# Patient Record
Sex: Male | Born: 1940 | Race: White | Hispanic: No | Marital: Married | Smoking: Former smoker
Health system: Southern US, Community
[De-identification: ages and names within clinical notes are randomized; demographics above are authoritative.]

## PROBLEM LIST (undated history)

## (undated) DIAGNOSIS — E7800 Pure hypercholesterolemia, unspecified: Secondary | ICD-10-CM

## (undated) DIAGNOSIS — Z85820 Personal history of malignant melanoma of skin: Secondary | ICD-10-CM

## (undated) DIAGNOSIS — K219 Gastro-esophageal reflux disease without esophagitis: Secondary | ICD-10-CM

## (undated) DIAGNOSIS — G250 Essential tremor: Secondary | ICD-10-CM

## (undated) DIAGNOSIS — E782 Mixed hyperlipidemia: Secondary | ICD-10-CM

## (undated) DIAGNOSIS — G629 Polyneuropathy, unspecified: Secondary | ICD-10-CM

## (undated) HISTORY — DX: Personal history of malignant melanoma of skin: Z85.820

## (undated) HISTORY — DX: Pure hypercholesterolemia, unspecified: E78.00

## (undated) HISTORY — DX: Mixed hyperlipidemia: E78.2

## (undated) HISTORY — PX: MELANOMA EXCISION: SHX5266

## (undated) HISTORY — DX: Gastro-esophageal reflux disease without esophagitis: K21.9

## (undated) HISTORY — DX: Polyneuropathy, unspecified: G62.9

## (undated) HISTORY — PX: GALLBLADDER SURGERY: SHX652

## (undated) HISTORY — PX: HERNIA REPAIR: SHX51

## (undated) HISTORY — DX: Essential tremor: G25.0

## (undated) HISTORY — PX: CATARACT EXTRACTION, BILATERAL: SHX1313

---

## 2002-09-19 ENCOUNTER — Observation Stay (HOSPITAL_COMMUNITY): Admission: EM | Admit: 2002-09-19 | Discharge: 2002-09-20 | Payer: Self-pay

## 2003-06-12 ENCOUNTER — Ambulatory Visit (HOSPITAL_COMMUNITY): Admission: RE | Admit: 2003-06-12 | Discharge: 2003-06-12 | Payer: Self-pay | Admitting: Family Medicine

## 2004-03-16 ENCOUNTER — Ambulatory Visit (HOSPITAL_COMMUNITY): Admission: RE | Admit: 2004-03-16 | Discharge: 2004-03-16 | Payer: Self-pay | Admitting: Gastroenterology

## 2004-11-30 ENCOUNTER — Encounter: Admission: RE | Admit: 2004-11-30 | Discharge: 2004-11-30 | Payer: Self-pay | Admitting: Family Medicine

## 2005-02-22 ENCOUNTER — Encounter (INDEPENDENT_AMBULATORY_CARE_PROVIDER_SITE_OTHER): Payer: Self-pay | Admitting: *Deleted

## 2005-02-22 ENCOUNTER — Ambulatory Visit (HOSPITAL_COMMUNITY): Admission: RE | Admit: 2005-02-22 | Discharge: 2005-02-22 | Payer: Self-pay | Admitting: General Surgery

## 2008-04-16 ENCOUNTER — Encounter: Admission: RE | Admit: 2008-04-16 | Discharge: 2008-04-16 | Payer: Self-pay | Admitting: Family Medicine

## 2008-05-12 ENCOUNTER — Encounter: Admission: RE | Admit: 2008-05-12 | Discharge: 2008-05-12 | Payer: Self-pay | Admitting: Family Medicine

## 2008-11-08 ENCOUNTER — Encounter: Admission: RE | Admit: 2008-11-08 | Discharge: 2008-11-08 | Payer: Self-pay | Admitting: Family Medicine

## 2010-08-22 IMAGING — US US ABDOMEN COMPLETE
1 series · 14 of 25 positions shown · non-contrast
Comparison: 11/30/2004

CLINICAL DATA: Right upper quadrant pain.  Worse with meals.

ABDOMEN ULTRASOUND
TECHNIQUE: Complete abdominal ultrasound examination was performed
including evaluation of the liver, gallbladder, bile ducts,
pancreas, kidneys, spleen, IVC, and abdominal aorta.

[Series 1: us abdomen complete · 0.35mm/px · 14 of 70 slices shown]
[im 1/70]
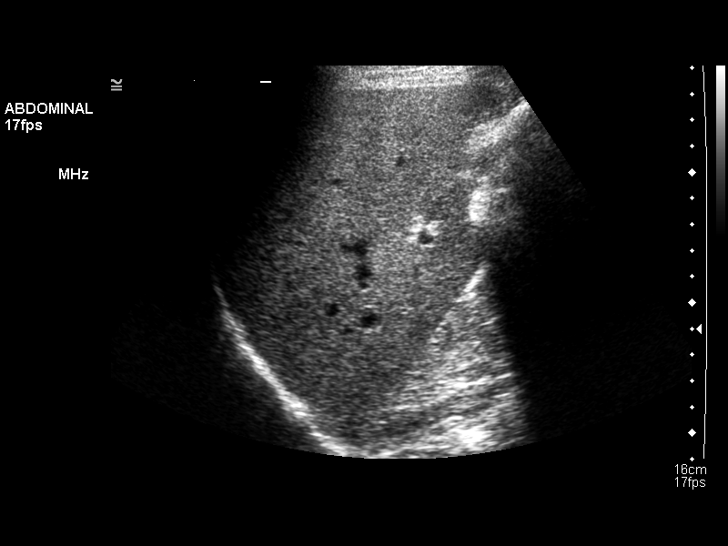
[im 6/70]
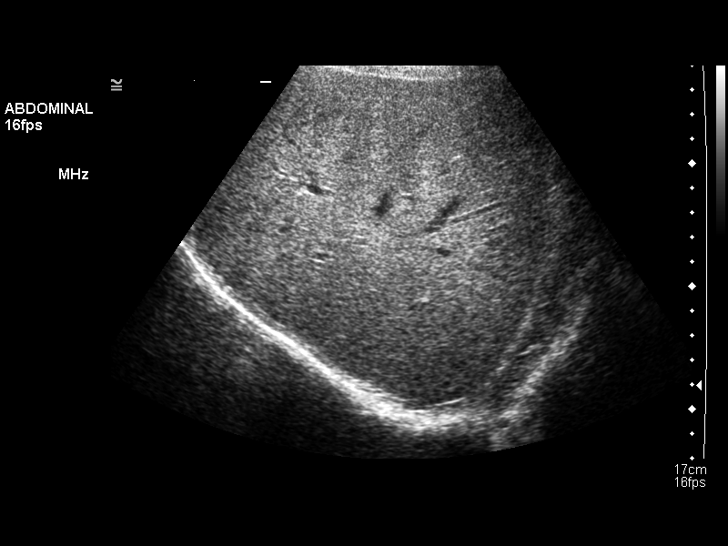
[im 12/70]
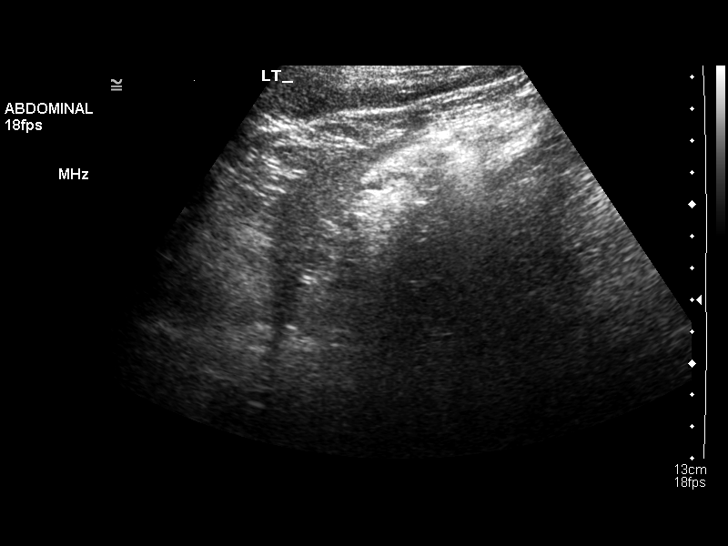
[im 18/70]
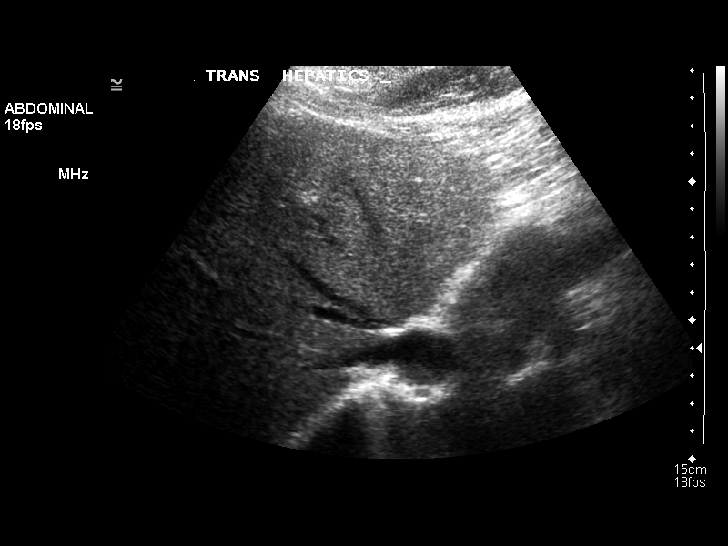
[im 24/70]
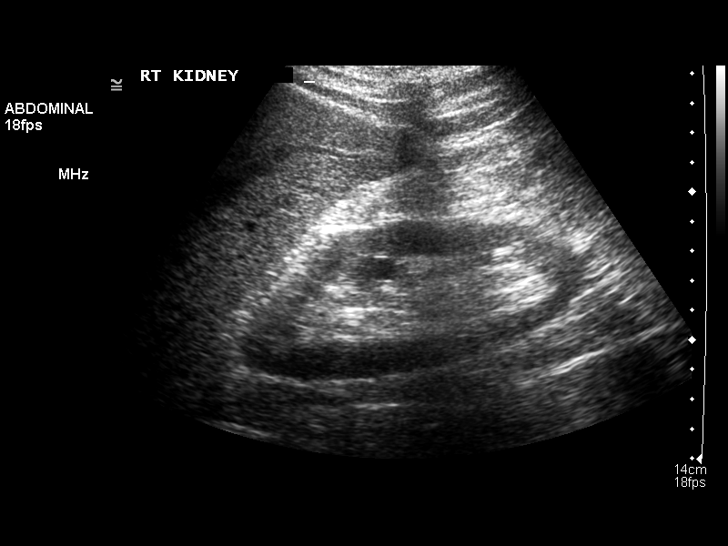
[im 26/70]
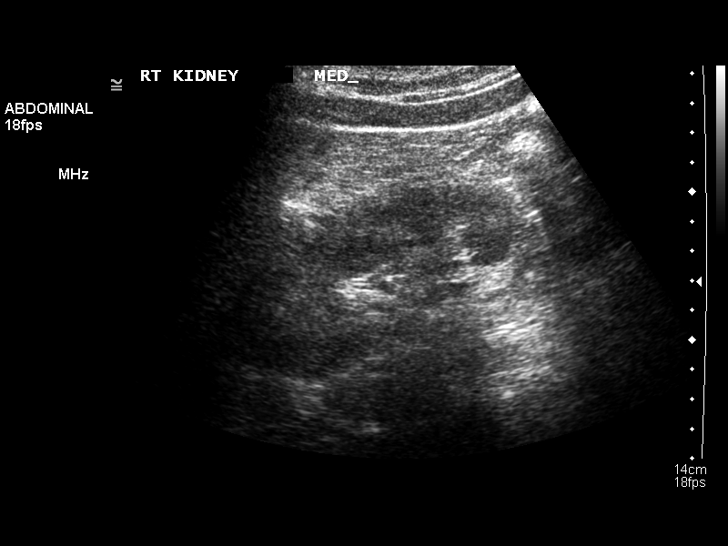
[im 32/70]
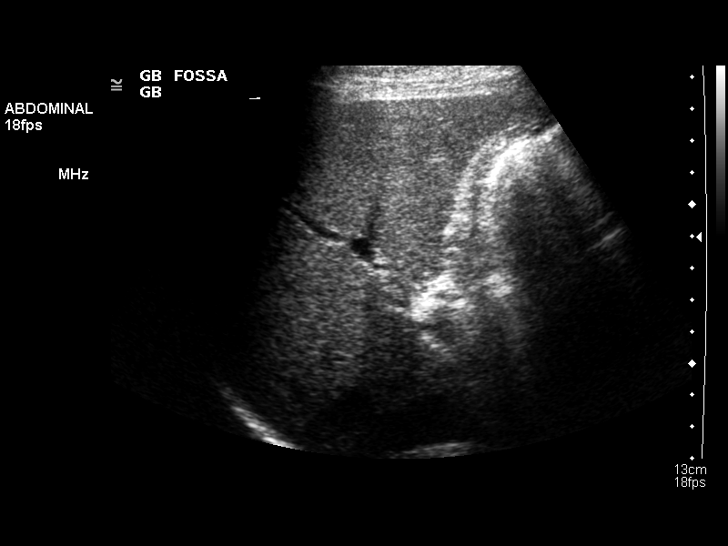
[im 38/70]
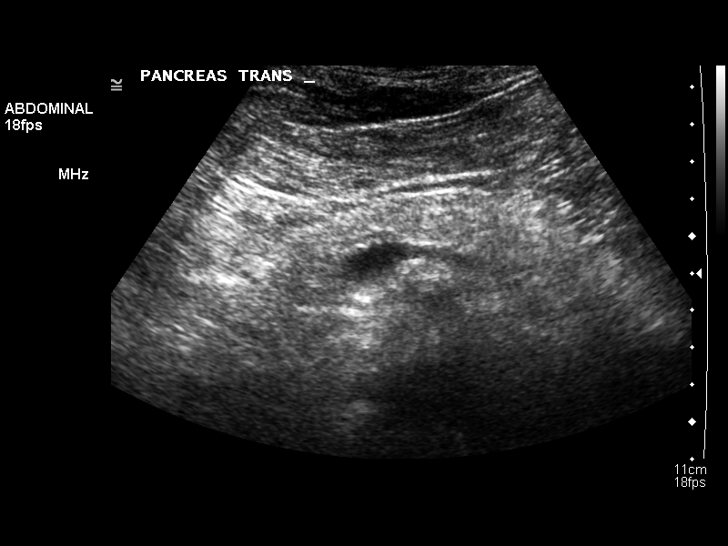
[im 44/70]
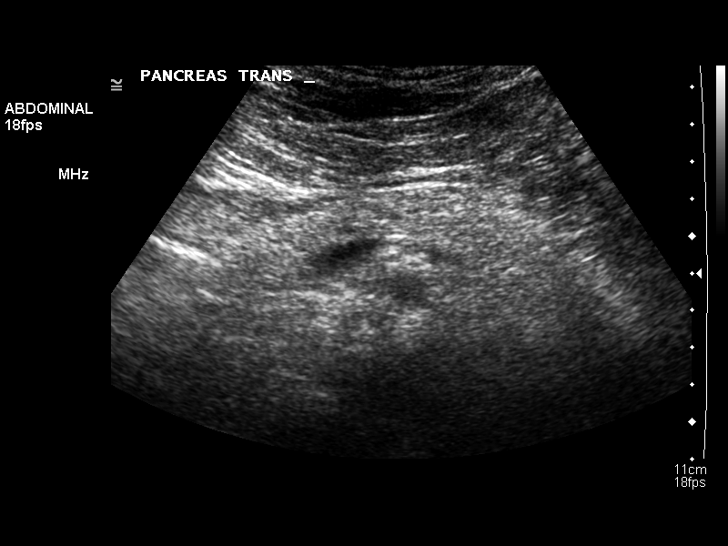
[im 47/70]
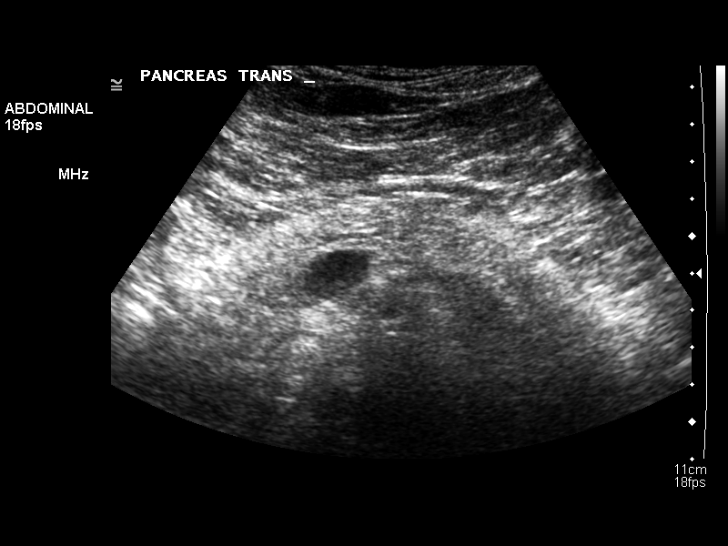
[im 52/70]
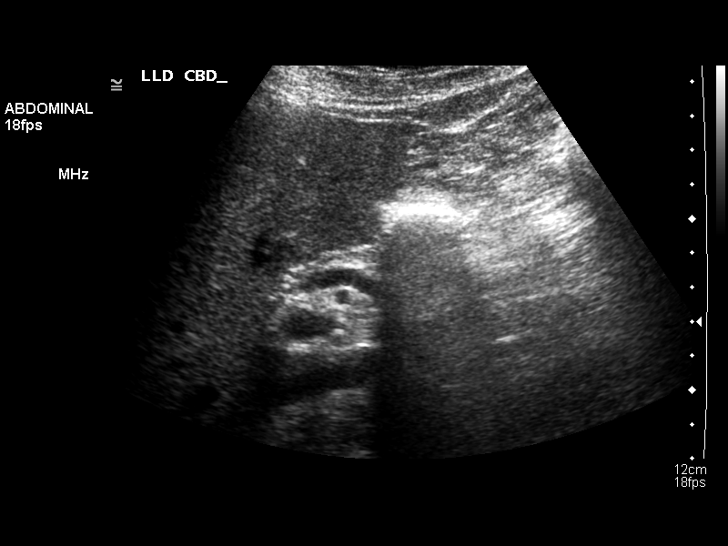
[im 58/70]
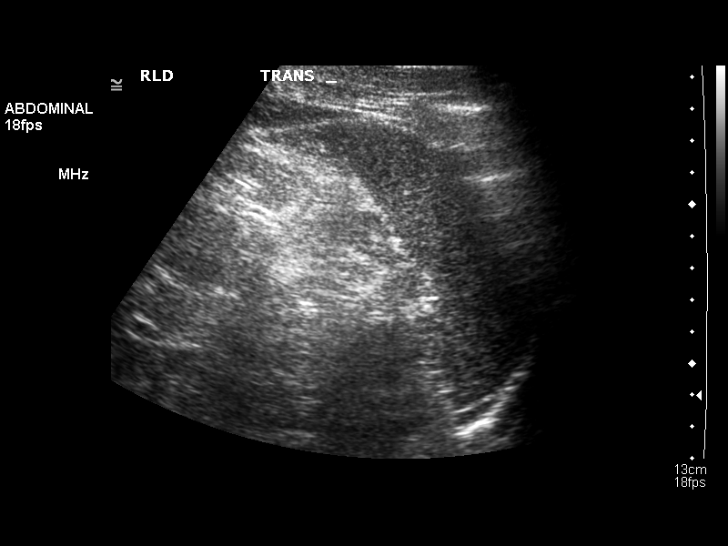
[im 64/70]
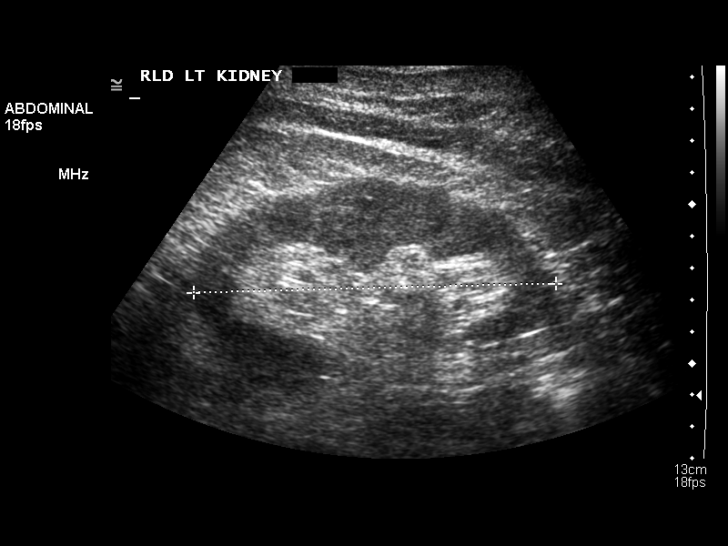
[im 70/70]
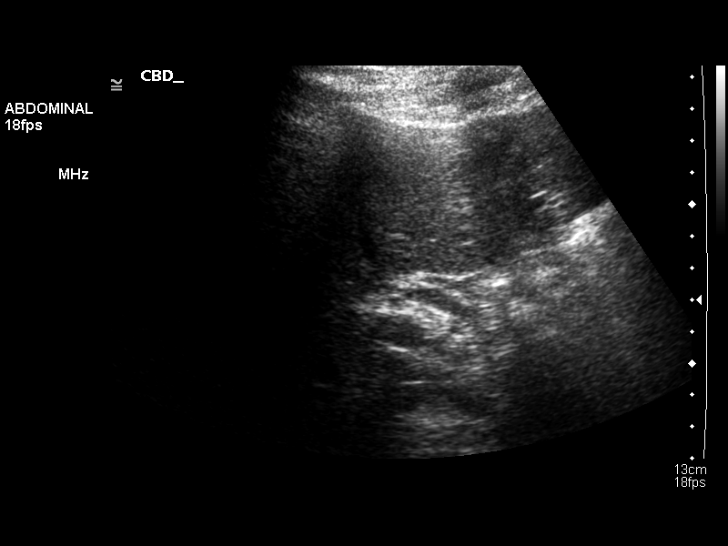

[14 of 25 positions shown; findings below may reference images not displayed]

FINDINGS: Patient status post cholecystectomy.

Common duct normal at 6mm.

Liver, IVC, pancreas all within normal limits.

Spleen normal in size and echotexture.

Right kidney 11.9 cm and left kidney 12.0 cm.  No hydronephrosis.

Abdominal aorta nonaneurysmal without ascites.
IMPRESSION: 1.  Cholecystectomy without biliary ductal dilatation.
2.  Otherwise, normal abdominal ultrasound as described.

## 2010-08-25 NOTE — Discharge Summary (Signed)
NAME:  Stephen Anderson, Stephen Anderson                        ACCOUNT NO.:  [id number]   MEDICAL RECORD NO.:  [id number]                   PATIENT TYPE:  INP   LOCATION:  3742                                 FACILITY:  [facility]   PHYSICIAN:  Lazaro Arms, M.D.        DATE OF BIRTH:  03/12/1941   DATE OF ADMISSION:  09/19/2002  DATE OF DISCHARGE:  09/20/2002                                 DISCHARGE SUMMARY   PRIMARY CARE PHYSICIAN:  Chales Salmon. Abigail Miyamoto, M.D.   DISCHARGE DIAGNOSES:  1. Atypical chest pain, ruled out for myocardial infarction.  2. History of tremors.   CONSULTATIONS:  Francisca December, M.D. from [facility].   HISTORY OF PRESENT ILLNESS:  Mr. Drumwright was in his usual state of health  until about two weeks ago when he began to have at rest constant chest pain  which had no clear exacerbating or relieving factors.  He has been very  active at work, and did not report that it was worse with more strenuous  activity.  He denied any fever or chills.  He did have some shortness of  breath with exertion which is his baseline.  On the morning of admission, he  said it was worse and that is why he came to the [facility] and  they sent him to the emergency room for evaluation.   HOSPITAL COURSE:  In the emergency room, he was afebrile, pulse was 54,  blood pressure was 130/60.  His EKG noted a partial right bundle branch  block.  He was admitted and ruled out for myocardial infarction with serial  negative enzymes.  He never became pain-free during his hospitalization, in  fact, he had mild chest 2/10 pressure upon arrival to the floor.  At that  point, he was placed on Nitro paste and Lovenox, and continued on telemetry.  In the morning, I asked [facility] to see him given his persistent  nature of his pain as well as his 40 pack year history of smoking.  He was  seen, and given the atypical nature of his pain, now completely normal EKG  which when repeated did  not show the partial right bundle branch block, as  well as his negative enzymes, it was felt that he was safe to go home and  complete instructions as an outpatient.   DISCHARGE MEDICATIONS:  1. Enteric coated aspirin 325 mg p.o. daily.  2. Multivitamin daily.  3. Protonix 40 mg p.o. daily.    FOLLOWUP:  1. He is to have a stress test at [facility].  The office will call     him for an appointment on Monday.  2. He will follow up with Dr. Abigail Miyamoto regarding his possible     gastroesophageal reflux disease.  He did have an esophageal stricture in     the remote past.  Lazaro Arms, M.D.    AMC/MEDQ  D:  09/20/2002  T:  09/20/2002  Job:  [id number]   cc:   Chales Salmon. Abigail Miyamoto, M.D.  [address]  [place]  [place] [address]  Fax: [phone]   Francisca December, M.D.  [address]  [address]  [place]  [place] [address]  Fax: [phone]

## 2010-08-25 NOTE — Consult Note (Signed)
NAME:  Stephen Anderson, Stephen Anderson                        ACCOUNT NO.:  [id number]   MEDICAL RECORD NO.:  [id number]                   PATIENT TYPE:  INP   LOCATION:  3742                                 FACILITY:  [facility]   PHYSICIAN:  Francisca December, M.D.               DATE OF BIRTH:  [birth year]   DATE OF CONSULTATION:  09/20/2002  DATE OF DISCHARGE:                                   CONSULTATION   REASON FOR CONSULTATION:  Chest tightness.   HISTORY OF PRESENT ILLNESS:  The patient is a pleasant 70 year old male  without a prior cardiac history, who approximately  1 week ago became aware  of a focal chest discomfort in the left lower sternal border without  radiation. He did not have any shortness of breath or nausea or associated  diaphoresis. He was relatively constant and mild. He once had a pressure  sensation. He was able to continue working without difficulty or  exacerbation of symptoms. 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 Just prior to the onset of symptoms he had an increase in strenuous  heavy manual labor at work.  He has not had any discomfort like this before.  He denies any discomfort now, although did have his last episode last  evening when being transferred from the emergency room off oxygen and he got  up to go to the bathroom. He has been up now off oxygen for several hours  without any discomfort.   ALLERGIES:  No known drug allergies.   MEDICATIONS:  1. Aspirin 81 mg p.o. every day.  2. Primidone questionable dose p.o. every day.  3. Multivitamin.   PAST MEDICAL HISTORY:  1. Essential tremor.  2. Bilateral hernia repair.  3. Hiatal hernia and gastroesophageal reflux disease.  4. Remote history of esophageal  stricture.   SOCIAL HISTORY:  He is married and lives with his wife. He is a self-  Field seismologist. He used tobacco products for 40 years. No ethanol or drug  use. He is the father of 2, grandfather of 6, great grandfather of 1.   FAMILY HISTORY:  Not significant for early coronary artery disease.   REVIEW OF SYSTEMS:  Denies any fevers or chills or congestion. He has not  been light headed or dizzy. He has had some choking on food recently. He  denies any melena or hematochezia. No chronic  abdominal pain. No dysuria,  hematuria or nocturia. He does note the onset of claudication or numbness  with arms over his chest level. He denies any lower extremity claudication.  No excessive muscle weakness or joint pain. He never had a neurologic  disorder such as a stroke or seizures.   PHYSICAL EXAMINATION:  VITAL SIGNS:  Blood pressure 104/64, pulse 62,  temperature 97.3, respirations 18, oxygen saturation on 2 liters nasal  cannula 98%. Telemetry showed sinus rhythm and sinus bradycardia throughout.  GENERAL:  He is a well appearing 70 year old gentleman, alert, pleasant and  cooperative.  HEENT:  Unremarkable. The head is normocephalic and atraumatic. Pupils are  equal, round and reactive to light and accomodation. Extraocular movements  intact. Sclerae anicteric. Oral mucosa pink and moist. Tongue is not coated.  NECK:  Supple without jugular venous distention sitting upright. There is no  bruits. The carotid upstrokes are normal. No thyromegaly.  CHEST:  Clear with adequate excursion. No bruit in the supraclavicular fossa  bilaterally.  No wheezing, rales or rhonchi on auscultation. The chest wall is nontender  to palpation.  HEART:  Regular rhythm, normal S1 and S2 is heard. No S3, S4, murmur, click  or rub noted.  ABDOMEN:  Soft, nontender without hepatosplenomegaly or midline pulsatile  mass. Bowel sounds are present in all quadrants.  GU:  External genitalia without lesions, testes descended.  RECTAL:  Not performed.  EXTREMITIES:  Full range of motion, no edema.  Intact distal  pulses.  NEUROLOGIC:  Cranial nerves 2 to 12 grossly intact. Motor and sensory  grossly  intact. Gait not tested.  SKIN:  Warm and  dry and clear.   LABORATORY DATA:  Electrocardiogram, sinus rhythm is normal this morning. He  did have a tracing with a flattened T-wave in V3 yesterday. Chest x-ray no  active disease.   Serum electrolytes, BUN, creatinine, glucose, normal. Admission hemogram  normal. CK-MB and troponin negative x3.   IMPRESSION:  1. Atypical angina.  2. History of severe esophageal disease.  3. Ongoing tobacco abuse.  4. Questionable lipid status.  5. Male sex and typical age.   PLAN:  Will recommend discharge to home on proton pump inhibitor, outpatient  stress test within the next 1 week. If he has recurrence of discomfort he  should call my office number. Recommend discontinue smoking. Also would  recommend GI evaluation if stress  test is unremarkable.                                               Francisca December, M.D.    JHE/MEDQ  D:  09/20/2002  T:  09/20/2002  Job:  [id number]   cc:   Chales Salmon. Abigail Miyamoto, M.D.  [address]  [place]  [place] [address]  Fax: [phone]

## 2010-08-25 NOTE — Op Note (Signed)
NAME:  Stephen Anderson, Stephen Anderson NO.:  [id number]   MEDICAL RECORD NO.:  [id number]          PATIENT TYPE:  AMB   LOCATION:  ENDO                         FACILITY:  [facility]   PHYSICIAN:  Bernette Redbird, M.D.   DATE OF BIRTH:  [birth year]   DATE OF PROCEDURE:  DATE OF DISCHARGE:                                 OPERATIVE REPORT   Audio too short to transcribe (less than 5 seconds)       RB/MEDQ  D:  03/16/2004  T:  03/16/2004  Job:  [id number]

## 2010-08-25 NOTE — Op Note (Signed)
NAME:  Stephen Anderson, Stephen Anderson NO.:  [id number]   MEDICAL RECORD NO.:  [id number]          PATIENT TYPE:  AMB   LOCATION:  ENDO                         FACILITY:  [facility]   PHYSICIAN:  Bernette Redbird, M.D.   DATE OF BIRTH:  02/26/1941   DATE OF PROCEDURE:  03/16/2004  DATE OF DISCHARGE:                                 OPERATIVE REPORT   PROCEDURE PERFORMED:  Colonoscopy.   ENDOSCOPIST:  Florencia Reasons, M.D.   INDICATIONS FOR PROCEDURE:  Screening.   FINDINGS:  Normal exam to the terminal ileum except mild left side  diverticulosis.   DESCRIPTION OF PROCEDURE:  The nature, purpose and risks of the procedure  had been discussed with the patient, who provided written consent.  Sedation  for this procedure and the upper endoscopy which preceded it totaled  fentanyl 75 mcg and Versed 5 mg IV without arrhythmias or desaturation.   Digital exam of the prostate was normal.  The Olympus adult video  colonoscope was advanced quite easily around the colon to the terminal ileum  which had a normal appearance and pullback was then performed.  The quality  of the prep was excellent and it is felt that all areas were well seen.  There were a few left-sided diverticula but this was otherwise a normal  examination, without evidence of polyps, cancer, colitis or vascular  malformations.  Retroflexion in the rectum and reinspection of the rectum  were unremarkable.  No biopsies were obtained.   The patient tolerated the procedure well and there were no apparent  complications.   IMPRESSION:  1.  Screening exam in a standard risk individual, without worrisome findings      (R5188).  2.  Mild to moderate left-sided diverticulosis.   PLAN:  Flexible sigmoidoscopy in five years for continued screening.      RB/MEDQ  D:  03/16/2004  T:  03/17/2004  Job:  [id number]   cc:   Sigmund Hazel, M.D.  [address]  [address] [place], [place] [address]  Fax: [phone]

## 2010-08-25 NOTE — Op Note (Signed)
NAME:  DENSON, NICCOLI NO.:  [id number]   MEDICAL RECORD NO.:  [id number]          PATIENT TYPE:  AMB   LOCATION:  ENDO                         FACILITY:  [facility]   PHYSICIAN:  Bernette Redbird, M.D.   DATE OF BIRTH:  [birth year]   DATE OF PROCEDURE:  03/16/2004  DATE OF DISCHARGE:                                 OPERATIVE REPORT   PROCEDURE PERFORMED:  Upper endoscopy.   INDICATIONS:  Reflux symptoms in a 70 year old.   FINDINGS:  Normal, perhaps slight inflammatory change in the vocal cords.   SURGEON:  Bernette Redbird, M.D.   The procedure, the nature, purpose and risks of the procedure had been  discussed with the patient who provided written consent.   SEDATION:  Fentanyl 75 mcg and Versed 5 mg IV without arrhythmias or  desaturations.   PROCEDURE IN DETAIL:  The Olympus adult, video endoscope was passed under  direct vision.  The vocal cords were pertinent for what appears to be some  slight inflammatory change in the posterior aspect of the larynx,  characterized by edema, erythema and some slight irregularity on the  posterior aspect of the right cord. The esophagus was entered and was  endoscopically normal without evidence of reflux esophagitis, free reflux,  Barrett's esophagus, varices, infection, neoplasia or any ring stricture or  hiatal hernia.  The stomach contained no significant residual and had  essentially normal mucosa, just a little bit of mottled erythema, no  erosions, ulcers, polyps or masses, including a retroflexed view of the  cardia.  The pylorus, duodenal folds and second duodenum was normal.   The scope was removed from the patient.  No biopsies were obtained.  He  tolerated the procedure well and there were no apparent complications.   IMPRESSION:  Possible mild reflux pharyngitis.   PLAN:  1.  Continue PPI therapy.  2.  Consider ENT evaluation.       RB/MEDQ  D:  03/16/2004  T:  03/17/2004  Job:  [id number]   cc:   Sigmund Hazel, M.D.  [address]  [address] [place], [place] [address]  Fax: [phone]

## 2010-08-25 NOTE — Op Note (Signed)
NAME:  Stephen Anderson, Stephen Anderson NO.:  [id number]   MEDICAL RECORD NO.:  [id number]          PATIENT TYPE:  AMB   LOCATION:  DAY                          FACILITY:  [facility]   PHYSICIAN:  Ollen Gross. Vernell Morgans, M.D. DATE OF BIRTH:  06/15/1940   DATE OF PROCEDURE:  02/22/2005  DATE OF DISCHARGE:  02/22/2005                                 OPERATIVE REPORT   PREOPERATIVE DIAGNOSIS:  Gallstones.   POSTOPERATIVE DIAGNOSIS:  Gallstones.   PROCEDURE:  Laparoscopic cholecystectomy with intraoperative cholangiogram.   SURGEON:  Dr. Carolynne Edouard   ASSISTANT:  Dr. Abbey Chatters   ANESTHESIA:  General endotracheal.   PROCEDURE:  After informed consent was obtained, the patient was brought to  the operating room, placed in supine position on the operating room table.  After adequate induction of general anesthesia, the patient's abdomen was  prepped with Betadine and draped in usual sterile manner.  The area below  the umbilicus was infiltrated with 260.5% Marcaine.  A small incision was  made with the 15 blade knife.  This incision was carried down through the  subcutaneous tissue bluntly with a hemostat and [employer] retractors until  the linea alba was identified.  The linea alba was incised with the 15 blade  knife, and each side was grasped with Kocher clamps and elevated anteriorly.  The preperitoneal space was then probed bluntly with a hemostat until the  peritoneum was opened and access was gained to the abdominal cavity.  A 0  Vicryl pursestring stitch was placed in the fascia surrounding the opening.  A Hasson cannula was placed through the opening and anchored in place with  the previously placed Vicryl pursestring stitch.  The abdomen was then  insufflated with carbon dioxide without difficulty.  A laparoscope was  inserted through the Hasson cannula, and the right upper quadrant was  inspected.  The dome of the gallbladder and liver were readily identified.  Next, the patient was  placed in the head-up position, rotated slightly with  the right side up.  The epigastric region was then infiltrated with 0.25%  Marcaine.  A small incision was made with the 15 blade knife.  A 10 mm port  was placed bluntly through this incision into the abdominal cavity under  direct vision.  Sites were then chosen laterally on the right side for  placement of 5 mm ports.  Each of these areas was infiltrated with 0.25%  Marcaine.  Small stab incisions were made with a 15 blade knife.  Five mm  ports were placed bluntly through these incisions into the abdominal cavity  under direct vision.  A blunt grasper was placed through the lateral most 5  mm port and used to grasp the dome of the gallbladder and elevate it  anteriorly and superiorly.  A blunt grasper was placed through the other 5  mm port and used to retract on the body and neck of the gallbladder.  A  dissector was placed through the epigastric port and using the  electrocautery, the peritoneal reflection at the gallbladder neck was  opened.  Blunt dissection was then  carried out in this area until the  gallbladder neck cystic duct junction was readily identified and a good  window was created.  A single clip was placed on the gallbladder neck.  A  small ductotomy was made just below the clip with the laparoscopic scissors.  A 14 gauge Angiocath was placed percutaneously through the anterior  abdominal wall under direct vision.  A Reddick cholangiogram catheter was  placed through the Angiocath and flushed.  The Reddick catheter was then  placed within the cystic duct and anchored in place with the clip.  Cholangiogram was obtained that showed no filling defects, good emptying in  the duodenum, and adequate length on the cystic duct.  The anchoring clip  and catheters were then removed from the patient.  Three clips were placed  proximally on the cystic duct, and duct was divided between the 2 sets of  clips.  Posterior to this,  the cystic artery was identified and again  dissected bluntly in a circumferential manner until a good window was  created.  Two clips were placed proximally and one distally on the artery,  and the artery was divided between the two.  Next, a laparoscopic hook  cautery device was used to separate the gallbladder from the liver bed.  Prior to completely detaching the gallbladder from the liver bed, the liver  bed was inspected, and several small bleeding points were coagulated with  electrocautery until the area was completely hemostatic.  The gallbladder  was then detached the rest of the way from the liver bed without difficulty.  Next, a laparoscopic bag was inserted through the epigastric port.  The  gallbladder was placed within the bag, and the bag was sealed.  The abdomen  was then irrigated with copious amounts of saline until the effluent was  clear.  The laparoscope was then moved to the epigastric port and  gallbladder grasper was placed through the Hasson cannula and used to grasp  the opening of the bag.  The bag with the gallbladder was then removed  through the infraumbilical port with the Hasson cannula without difficulty.  The fascial defect was closed with the previously placed Vicryl pursestring  stitch as well as with another interrupted 0 Vicryl stitch.  The rest of the  ports were removed under direct vision and were found to be hemostatic.  The  gas was allowed to escape.  The skin incisions were all closed with  interrupted 4-0 Monocryl subcuticular stitches.  Benzoin, Steri-Strips, and  sterile dressings were applied.  The patient tolerated the procedure well.  At the end of the case, all needle, sponge, and instrument counts were  correct.  The patient was then awakened and taken to recovery in stable  condition.      Ollen Gross. Vernell Morgans, M.D.  Electronically Signed    PST/MEDQ  D:  02/27/2005  T:  02/27/2005  Job:  [id number]

## 2011-07-20 DIAGNOSIS — E782 Mixed hyperlipidemia: Secondary | ICD-10-CM | POA: Diagnosis not present

## 2011-07-20 DIAGNOSIS — M25549 Pain in joints of unspecified hand: Secondary | ICD-10-CM | POA: Diagnosis not present

## 2011-07-20 DIAGNOSIS — K219 Gastro-esophageal reflux disease without esophagitis: Secondary | ICD-10-CM | POA: Diagnosis not present

## 2011-07-20 DIAGNOSIS — G250 Essential tremor: Secondary | ICD-10-CM | POA: Diagnosis not present

## 2011-07-20 DIAGNOSIS — Z86010 Personal history of colonic polyps: Secondary | ICD-10-CM | POA: Diagnosis not present

## 2011-07-20 DIAGNOSIS — G609 Hereditary and idiopathic neuropathy, unspecified: Secondary | ICD-10-CM | POA: Diagnosis not present

## 2011-07-20 DIAGNOSIS — Z79899 Other long term (current) drug therapy: Secondary | ICD-10-CM | POA: Diagnosis not present

## 2011-07-20 DIAGNOSIS — G252 Other specified forms of tremor: Secondary | ICD-10-CM | POA: Diagnosis not present

## 2011-07-25 DIAGNOSIS — S335XXA Sprain of ligaments of lumbar spine, initial encounter: Secondary | ICD-10-CM | POA: Diagnosis not present

## 2011-10-18 DIAGNOSIS — K5730 Diverticulosis of large intestine without perforation or abscess without bleeding: Secondary | ICD-10-CM | POA: Diagnosis not present

## 2011-10-18 DIAGNOSIS — Z09 Encounter for follow-up examination after completed treatment for conditions other than malignant neoplasm: Secondary | ICD-10-CM | POA: Diagnosis not present

## 2011-10-18 DIAGNOSIS — Z86010 Personal history of colonic polyps: Secondary | ICD-10-CM | POA: Diagnosis not present

## 2011-12-04 DIAGNOSIS — H52209 Unspecified astigmatism, unspecified eye: Secondary | ICD-10-CM | POA: Diagnosis not present

## 2011-12-04 DIAGNOSIS — Z961 Presence of intraocular lens: Secondary | ICD-10-CM | POA: Diagnosis not present

## 2011-12-19 DIAGNOSIS — D1801 Hemangioma of skin and subcutaneous tissue: Secondary | ICD-10-CM | POA: Diagnosis not present

## 2012-03-25 DIAGNOSIS — E559 Vitamin D deficiency, unspecified: Secondary | ICD-10-CM | POA: Diagnosis not present

## 2012-03-25 DIAGNOSIS — G250 Essential tremor: Secondary | ICD-10-CM | POA: Diagnosis not present

## 2012-03-25 DIAGNOSIS — G252 Other specified forms of tremor: Secondary | ICD-10-CM | POA: Diagnosis not present

## 2012-03-25 DIAGNOSIS — E538 Deficiency of other specified B group vitamins: Secondary | ICD-10-CM | POA: Diagnosis not present

## 2012-03-25 DIAGNOSIS — K219 Gastro-esophageal reflux disease without esophagitis: Secondary | ICD-10-CM | POA: Diagnosis not present

## 2012-03-25 DIAGNOSIS — E782 Mixed hyperlipidemia: Secondary | ICD-10-CM | POA: Diagnosis not present

## 2012-03-25 DIAGNOSIS — G609 Hereditary and idiopathic neuropathy, unspecified: Secondary | ICD-10-CM | POA: Diagnosis not present

## 2012-03-25 DIAGNOSIS — Z79899 Other long term (current) drug therapy: Secondary | ICD-10-CM | POA: Diagnosis not present

## 2012-03-25 DIAGNOSIS — Z125 Encounter for screening for malignant neoplasm of prostate: Secondary | ICD-10-CM | POA: Diagnosis not present

## 2012-05-16 ENCOUNTER — Encounter: Payer: Self-pay | Admitting: Neurology

## 2012-05-16 ENCOUNTER — Ambulatory Visit (INDEPENDENT_AMBULATORY_CARE_PROVIDER_SITE_OTHER): Payer: Medicare Other | Admitting: Neurology

## 2012-05-16 ENCOUNTER — Other Ambulatory Visit: Payer: Self-pay | Admitting: Neurology

## 2012-05-16 VITALS — BP 120/74 | HR 68 | Temp 97.6°F | Resp 16 | Ht 69.0 in | Wt 180.0 lb

## 2012-05-16 DIAGNOSIS — G252 Other specified forms of tremor: Secondary | ICD-10-CM | POA: Diagnosis not present

## 2012-05-16 DIAGNOSIS — G609 Hereditary and idiopathic neuropathy, unspecified: Secondary | ICD-10-CM | POA: Insufficient documentation

## 2012-05-16 DIAGNOSIS — G250 Essential tremor: Secondary | ICD-10-CM | POA: Insufficient documentation

## 2012-05-16 DIAGNOSIS — G629 Polyneuropathy, unspecified: Secondary | ICD-10-CM | POA: Insufficient documentation

## 2012-05-16 LAB — FOLATE: Folate: 13.8 ng/mL (ref >5.9)

## 2012-05-16 LAB — BASIC METABOLIC PANEL
BUN: 20 mg/dL (ref 6–23)
CO2: 25 mEq/L (ref 19–32)
Calcium: 9.2 mg/dL (ref 8.4–10.5)
Chloride: 105 mEq/L (ref 96–112)
Creatinine, Ser: 1.2 mg/dL (ref 0.4–1.5)
GFR: 64.88 mL/min (ref >60.00)
Glucose, Bld: 109 mg/dL — ABNORMAL HIGH (ref 70–99)
Potassium: 4.6 mEq/L (ref 3.5–5.1)
Sodium: 139 mEq/L (ref 135–145)

## 2012-05-16 LAB — RPR

## 2012-05-16 LAB — VITAMIN B12: Vitamin B-12: 506 pg/mL (ref 211–911)

## 2012-05-16 MED ORDER — CLONAZEPAM 0.5 MG PO TABS: ORAL_TABLET | ORAL | Status: DC

## 2012-05-16 NOTE — Progress Notes (Signed)
Subjective:   Stephen Anderson was seen in consultation in the movement disorder clinic.  The evaluation is for tremor.  His PCP is [name],[name], MD.   The patient is a 72 y.o. right handed male with a history of tremor.Onset of symptoms was 30+ years ago.   Tremor primarily involves the bilateral hand and chin.   Since he quit smoking 4 years ago, his hand tremor went away.   Tremor exacerbated by pressure on the chin.   He is not biting himself because of the tremor.   It only bothers him a little but it bothers his wife.  There is a family hx of tremor in his mother.  He is on primidone and has been on it for 30 years.  He is on 50 mg, 2 tablets twice per day.  He thinks that it helps.  He reports arm/hand and leg paresthesias for years.  He has definite paresthesias from the knee down.  He is on neurontin for this.  He has some loss of balance.  He does not fall on the level ground.  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    Current/Previously tried tremor medications: primidone  Current medications that may exacerbate tremor:  n/a   Allergies  Allergen Reactions  . Oxycontin (Oxycodone Hcl Er) Hives    Current Outpatient Prescriptions on File Prior to Visit  Medication Sig Dispense Refill  . CRESTOR 5 MG tablet       . gabapentin (NEURONTIN) 300 MG capsule       . omeprazole (PRILOSEC) 40 MG capsule       . primidone (MYSOLINE) 50 MG tablet         History reviewed. No pertinent past medical history.  History reviewed. No pertinent past surgical history.  History   Social History  . Marital Status: Married    Spouse Name: N/A    Number of Children: N/A  . Years of Education: N/A   Occupational History  . Not on file.   Social History Main Topics  . Smoking status: Not on file  . Smokeless tobacco: Not on file  . Alcohol Use: Not on file  . Drug Use: Not on file  . Sexually Active: Not on file   Other Topics Concern  . Not on file   Social  History Narrative  . No narrative on file    No family status information on file.    Review of Systems A complete 10 system ROS was obtained and was negative apart from what is mentioned.   Objective:   VITALS:   Filed Vitals:   05/16/12 0853  BP: 120/74  Pulse: 68  Temp: 97.6 F (36.4 C)  Resp: 16  Height: 5\' 9"  (1.753 m)  Weight: 180 lb (81.647 kg)   Gen:  Appears stated age and in NAD. HEENT:  Normocephalic, atraumatic. The mucous membranes are moist. The superficial temporal arteries are without ropiness or tenderness. Cardiovascular: Regular rate and rhythm. Lungs: Clear to auscultation bilaterally. Neck: There are no carotid bruits noted bilaterally.  NEUROLOGICAL:  Orientation:  The patient is alert and oriented x 3.  Recent and remote memory are intact.  Attention span and concentration are normal.  Able to name objects and repeat without trouble.  Fund of knowledge is appropriate Cranial nerves: There is good facial symmetry. The pupils are equal round and reactive to light bilaterally. Fundoscopic exam reveals clear disc margins bilaterally. Extraocular muscles are intact  and visual fields are full to confrontational testing. Speech is fluent and clear. Soft palate rises symmetrically and there is no tongue deviation. Hearing is intact to conversational tone. Tone: Tone is good throughout. Sensation: Sensation is intact to light touch and pinprick throughout (facial, trunk, extremities). Vibration is decreased distally. There is no extinction with double simultaneous stimulation. There is no sensory dermatomal level identified. Coordination:  The patient has no dysdiadichokinesia or dysmetria. Motor: Strength is 5/5 in the bilateral upper and lower extremities.  Shoulder shrug is equal bilaterally.  There is no pronator drift.  There are no fasciculations noted. DTR's: Deep tendon reflexes are 2/4 at the bilateral biceps, triceps, brachioradialis, patella and achilles.   Plantar responses are downgoing bilaterally. Gait and Station: The patient is able to ambulate without difficulty.   MOVEMENT EXAM: Tremor:  There is minimal tremor in the UE. A moderate chin tremor is present.    The patient has mild difficulty with Archimedes spirals bilaterally.  He has no difficulty pouring water from one glass to another without spilling it.     Assessment/Plan   1.  Essential Tremor  -This is somewhat atypical in that the chin tremor is most prominent.  However, given the fact that he has a family history of tremor and it has been present in him for over 30 years, this likely represents a form of essential tremor, and not just benign chin tremor.  -I gave him several options to include doing nothing and staying on the primidone 100 mg twice a day, increasing the primidone or trying a small dosage of clonazepam at bedtime.  He would like to try the clonazepam.  If that does not work, then he will call him back and we will try to increase the dose of the primidone.  I asked him to just take the clonazepam at bedtime initially because of the side effect of sleepiness.  Risks, benefits, side effects and alternative therapies were discussed.  The opportunity to ask questions was given and they were answered to the best of my ability.  The patient expressed understanding and willingness to follow the outlined treatment protocols. 2.  Peripheral neuropathy, idiopathic.  -I am going to check some lab work including B12, folate, RPR and serum and urinary protein electrophoresis with immunofixation.  We are going to hold off on an EMG for now.  Talked about safety associated with peripheral neuropathy. 3.  Return in about 6 months (around 11/13/2012).

## 2012-05-16 NOTE — Patient Instructions (Addendum)
1.  Continue with the primidone 2.  Start klonopin - 0.5 mg - 1/2 tablet at night.  If no help and no side effects, increase to one tablet at night and call me in a few weeks to let me know how you are doing 3.  We will get labs today

## 2012-05-20 LAB — SPEP & IFE WITH QIG
Albumin ELP: 59.3 % (ref 55.8–66.1)
Alpha-1-Globulin: 4.2 % (ref 2.9–4.9)
Alpha-2-Globulin: 11.7 % (ref 7.1–11.8)
Beta 2: 5.5 % (ref 3.2–6.5)
Beta Globulin: 6.4 % (ref 4.7–7.2)
Gamma Globulin: 12.9 % (ref 11.1–18.8)
IgA: 169 mg/dL (ref 68–379)
IgG (Immunoglobin G), Serum: 1090 mg/dL (ref 650–1600)
IgM, Serum: 48 mg/dL (ref 41–251)
Total Protein, Serum Electrophoresis: 7.1 g/dL (ref 6.0–8.3)

## 2012-05-20 LAB — IMMUNOFIXATION ELECTROPHORESIS
IgA: 174 mg/dL (ref 68–379)
IgG (Immunoglobin G), Serum: 1110 mg/dL (ref 650–1600)
IgM, Serum: 48 mg/dL (ref 41–251)
Total Protein, Serum Electrophoresis: 6.9 g/dL (ref 6.0–8.3)

## 2012-05-20 LAB — PROTEIN ELECTROPHORESIS, URINE REFLEX: Total Protein, Urine: 10 mg/dL

## 2012-05-28 ENCOUNTER — Telehealth: Payer: Self-pay

## 2012-05-28 NOTE — Telephone Encounter (Signed)
Great. Thank you.

## 2012-05-28 NOTE — Telephone Encounter (Signed)
Pt calling to let you know that the half a klonopin is working well for him.

## 2012-07-03 DIAGNOSIS — E538 Deficiency of other specified B group vitamins: Secondary | ICD-10-CM | POA: Diagnosis not present

## 2012-07-03 DIAGNOSIS — Z79899 Other long term (current) drug therapy: Secondary | ICD-10-CM | POA: Diagnosis not present

## 2012-07-03 DIAGNOSIS — E782 Mixed hyperlipidemia: Secondary | ICD-10-CM | POA: Diagnosis not present

## 2012-07-03 DIAGNOSIS — G252 Other specified forms of tremor: Secondary | ICD-10-CM | POA: Diagnosis not present

## 2012-07-03 DIAGNOSIS — K219 Gastro-esophageal reflux disease without esophagitis: Secondary | ICD-10-CM | POA: Diagnosis not present

## 2012-07-03 DIAGNOSIS — G250 Essential tremor: Secondary | ICD-10-CM | POA: Diagnosis not present

## 2012-07-03 DIAGNOSIS — E559 Vitamin D deficiency, unspecified: Secondary | ICD-10-CM | POA: Diagnosis not present

## 2012-07-03 DIAGNOSIS — Z125 Encounter for screening for malignant neoplasm of prostate: Secondary | ICD-10-CM | POA: Diagnosis not present

## 2012-07-03 DIAGNOSIS — G609 Hereditary and idiopathic neuropathy, unspecified: Secondary | ICD-10-CM | POA: Diagnosis not present

## 2012-08-07 ENCOUNTER — Other Ambulatory Visit: Payer: Self-pay | Admitting: Physician Assistant

## 2012-08-07 DIAGNOSIS — L820 Inflamed seborrheic keratosis: Secondary | ICD-10-CM | POA: Diagnosis not present

## 2012-08-07 DIAGNOSIS — L570 Actinic keratosis: Secondary | ICD-10-CM | POA: Diagnosis not present

## 2012-08-07 DIAGNOSIS — D485 Neoplasm of uncertain behavior of skin: Secondary | ICD-10-CM | POA: Diagnosis not present

## 2012-10-15 DIAGNOSIS — E782 Mixed hyperlipidemia: Secondary | ICD-10-CM | POA: Diagnosis not present

## 2012-10-15 DIAGNOSIS — G250 Essential tremor: Secondary | ICD-10-CM | POA: Diagnosis not present

## 2012-10-15 DIAGNOSIS — E559 Vitamin D deficiency, unspecified: Secondary | ICD-10-CM | POA: Diagnosis not present

## 2012-10-15 DIAGNOSIS — Z86010 Personal history of colonic polyps: Secondary | ICD-10-CM | POA: Diagnosis not present

## 2012-10-15 DIAGNOSIS — G252 Other specified forms of tremor: Secondary | ICD-10-CM | POA: Diagnosis not present

## 2012-10-15 DIAGNOSIS — Z0000 Encounter for general adult medical examination without abnormal findings: Secondary | ICD-10-CM | POA: Diagnosis not present

## 2012-10-15 DIAGNOSIS — G609 Hereditary and idiopathic neuropathy, unspecified: Secondary | ICD-10-CM | POA: Diagnosis not present

## 2012-10-15 DIAGNOSIS — Z79899 Other long term (current) drug therapy: Secondary | ICD-10-CM | POA: Diagnosis not present

## 2012-10-15 DIAGNOSIS — K219 Gastro-esophageal reflux disease without esophagitis: Secondary | ICD-10-CM | POA: Diagnosis not present

## 2012-10-16 DIAGNOSIS — D2330 Other benign neoplasm of skin of unspecified part of face: Secondary | ICD-10-CM | POA: Diagnosis not present

## 2012-10-16 DIAGNOSIS — L219 Seborrheic dermatitis, unspecified: Secondary | ICD-10-CM | POA: Diagnosis not present

## 2012-11-09 DIAGNOSIS — R1032 Left lower quadrant pain: Secondary | ICD-10-CM | POA: Diagnosis not present

## 2012-11-09 DIAGNOSIS — K5732 Diverticulitis of large intestine without perforation or abscess without bleeding: Secondary | ICD-10-CM | POA: Diagnosis not present

## 2012-11-21 ENCOUNTER — Telehealth: Payer: Self-pay

## 2012-11-21 NOTE — Telephone Encounter (Signed)
Faxed refill request for pt's clonazepam.  Pt due for f/u this month.

## 2012-11-21 NOTE — Telephone Encounter (Signed)
Okay for 1 month and have him make a f/u appt

## 2012-11-25 NOTE — Telephone Encounter (Signed)
Pt aware, his wife to schedule appt.

## 2012-12-01 ENCOUNTER — Encounter: Payer: Self-pay | Admitting: Neurology

## 2012-12-01 ENCOUNTER — Ambulatory Visit (INDEPENDENT_AMBULATORY_CARE_PROVIDER_SITE_OTHER): Payer: Medicare Other | Admitting: Neurology

## 2012-12-01 VITALS — BP 102/58 | HR 64 | Temp 97.9°F | Resp 16 | Wt 176.0 lb

## 2012-12-01 DIAGNOSIS — G609 Hereditary and idiopathic neuropathy, unspecified: Secondary | ICD-10-CM | POA: Diagnosis not present

## 2012-12-01 DIAGNOSIS — G250 Essential tremor: Secondary | ICD-10-CM

## 2012-12-01 DIAGNOSIS — R109 Unspecified abdominal pain: Secondary | ICD-10-CM | POA: Diagnosis not present

## 2012-12-01 NOTE — Progress Notes (Signed)
Subjective:   Stephen Anderson was seen in consultation in the movement disorder clinic.  The evaluation is for tremor.  His PCP is [name],[name], MD.   The patient is a 72 y.o. right handed male with a history of tremor.Onset of symptoms was 30+ years ago.   Tremor primarily involves the bilateral hand and chin.   Since he quit smoking 4 years ago, his hand tremor went away.   Tremor exacerbated by pressure on the chin.   He is not biting himself because of the tremor.   It only bothers him a little but it bothers his wife.  There is a family hx of tremor in his mother.  He is on primidone and has been on it for 30 years.  He is on 50 mg, 2 tablets twice per day.  He thinks that it helps.  He reports arm/hand and leg paresthesias for years.  He has definite paresthesias from the knee down.  He is on neurontin for this.  He has some loss of balance.  He does not fall on the level ground.  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    12/01/12 update:  I have not seen the pt since Feb.  He is on primidone 100 mg bid and lat visit klonopin was added.  He is only taking 1/2 tablet at night and it works well.  He has had some sleepiness, but does not think that it is related to the Klonopin.  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  He thinks that it may just be because he works hard.  He is up on ladders.  He would like to try and get down on some of his medications.  He had labs since last visit for PN.  His B12 was 506, folate 13.8, RPR was negative and there was no monoclonal proteins in the serum or urinary protein electrophoresis.    He did have diverticulitis since our last visit (3 weeks ago).  He is off antibiotics.  He is not completely back to normal.  Current/Previously tried tremor medications: primidone  Current medications that may exacerbate tremor:  n/a   Allergies  Allergen Reactions  . Oxycontin [Oxycodone Hcl Er] Hives     Current Outpatient Prescriptions on File Prior to Visit  Medication Sig Dispense Refill  . acetaminophen (TYLENOL) 650 MG CR tablet Take 650 mg by mouth every 8 (eight) hours as needed.      . cholecalciferol (VITAMIN D) 1000 UNITS tablet Take 1,000 Units by mouth daily.      . clonazePAM (KLONOPIN) 0.5 MG tablet 1/2 to 1 tablet at night  30 tablet  5  . CRESTOR 5 MG tablet       . gabapentin (NEURONTIN) 300 MG capsule       . omeprazole (PRILOSEC) 40 MG capsule       . primidone (MYSOLINE) 50 MG tablet       . vitamin B-12 (CYANOCOBALAMIN) 100 MCG tablet Take 100 mcg by mouth daily.       No current facility-administered medications on file prior to visit.    Past Medical History  Diagnosis Date  . Hypercholesteremia   . Reflux     Past Surgical History  Procedure Laterality Date  . Gallbladder surgery    . Hernia repair    . Cataract extraction, bilateral      History   Social  History  . Marital Status: Married    Spouse Name: N/A    Number of Children: N/A  . Years of Education: N/A   Occupational History  . retired     Insurance underwriter order; painting   Social History Main Topics  . Smoking status: Former Smoker    Quit date: 04/06/2008  . Smokeless tobacco: Former Neurosurgeon    Quit date: 05/16/2002     Comment: prior hx of tobacco 55 years  . Alcohol Use: No  . Drug Use: No  . Sexual Activity: Not on file   Other Topics Concern  . Not on file   Social History Narrative  . No narrative on file    Family Status  Relation Status Death Age  . Mother Alive     tremor  . Father Alive     healthy  . Brother Alive     2, MI  . Sister Deceased 65 months old    unknown  . Child Alive     2, healthy    Review of Systems A complete 10 system ROS was obtained and was negative apart from what is mentioned.   Objective:   VITALS:   Filed Vitals:   12/01/12 0852  BP: 102/58  Pulse: 64  Temp: 97.9 F (36.6 C)  Resp: 16  Weight: 176 lb (79.833 kg)    Gen:  Appears stated age and in NAD. HEENT:  Normocephalic, atraumatic. The mucous membranes are moist. The superficial temporal arteries are without ropiness or tenderness. Cardiovascular: Regular rate and rhythm. Lungs: Clear to auscultation bilaterally. Neck: There are no carotid bruits noted bilaterally.  NEUROLOGICAL:  Orientation:  The patient is alert and oriented x 3.  Recent and remote memory are intact.  Attention span and concentration are normal.  Able to name objects and repeat without trouble.  Fund of knowledge is appropriate Cranial nerves: There is good facial symmetry. The pupils are equal round and reactive to light bilaterally. Fundoscopic exam reveals clear disc margins bilaterally. Extraocular muscles are intact and visual fields are full to confrontational testing. Speech is fluent and clear. Soft palate rises symmetrically and there is no tongue deviation. Hearing is intact to conversational tone. Tone: Tone is good throughout. Sensation: Sensation is intact to light touch and pinprick throughout (facial, trunk, extremities). Vibration is decreased distally. There is no extinction with double simultaneous stimulation. There is no sensory dermatomal level identified. Coordination:  The patient has no dysdiadichokinesia or dysmetria. Motor: Strength is 5/5 in the bilateral upper and lower extremities.  Shoulder shrug is equal bilaterally.  There is no pronator drift.  There are no fasciculations noted. DTR's: Deep tendon reflexes are 2/4 at the bilateral biceps, triceps, brachioradialis, patella and achilles.  Plantar responses are downgoing bilaterally. Gait and Station: The patient is able to ambulate without difficulty.   MOVEMENT EXAM: Tremor:  There is virtually no in the UE. A intermittent, mild chin tremor is present.           Assessment/Plan   1.  Essential Tremor  -This is somewhat atypical in that the chin tremor is most prominent.  However, given the  fact that he has a family history of tremor and it has been present in him for over 30 years, this likely represents a form of essential tremor, and not just benign chin tremor.  -He will continue with the primidone but we are going to decrease the morning dosage from 100 mg to 50 mg to see  if that will help the sleepiness.  He will remain on 100 mg at night.  Clonazepam has worked well and he will continue with half tablet at night. Risks, benefits, side effects and alternative therapies were discussed.  The opportunity to ask questions was given and they were answered to the best of my ability.  The patient expressed understanding and willingness to follow the outlined treatment protocols. 2.  Peripheral neuropathy, idiopathic.  -His labs were unremarkable.  I did talk to him about safety, especially since he climbs up and down ladders.  I would like to see him not to do that. 3.  followup in 6 months.  In the meantime, I will check a copy of his labs from his primary care physician.

## 2012-12-01 NOTE — Patient Instructions (Signed)
1. Follow up in 6 months.  

## 2012-12-15 DIAGNOSIS — R198 Other specified symptoms and signs involving the digestive system and abdomen: Secondary | ICD-10-CM | POA: Diagnosis not present

## 2012-12-15 DIAGNOSIS — Z79899 Other long term (current) drug therapy: Secondary | ICD-10-CM | POA: Diagnosis not present

## 2012-12-15 DIAGNOSIS — K219 Gastro-esophageal reflux disease without esophagitis: Secondary | ICD-10-CM | POA: Diagnosis not present

## 2012-12-16 DIAGNOSIS — M79609 Pain in unspecified limb: Secondary | ICD-10-CM | POA: Diagnosis not present

## 2012-12-16 DIAGNOSIS — G909 Disorder of the autonomic nervous system, unspecified: Secondary | ICD-10-CM | POA: Diagnosis not present

## 2012-12-16 DIAGNOSIS — G589 Mononeuropathy, unspecified: Secondary | ICD-10-CM | POA: Diagnosis not present

## 2012-12-17 DIAGNOSIS — L219 Seborrheic dermatitis, unspecified: Secondary | ICD-10-CM | POA: Diagnosis not present

## 2012-12-17 DIAGNOSIS — D239 Other benign neoplasm of skin, unspecified: Secondary | ICD-10-CM | POA: Diagnosis not present

## 2012-12-23 ENCOUNTER — Telehealth: Payer: Self-pay

## 2012-12-23 MED ORDER — CLONAZEPAM 0.5 MG PO TABS: ORAL_TABLET | ORAL | Status: DC

## 2012-12-23 NOTE — Telephone Encounter (Signed)
Faxed refill request from [facility] for Clonazepam

## 2012-12-23 NOTE — Telephone Encounter (Signed)
May refill.  0.5 mg, 1/2 to 1 tablet at night with 5 refills

## 2012-12-23 NOTE — Telephone Encounter (Signed)
rx sent to pharmacy

## 2013-01-21 DIAGNOSIS — R198 Other specified symptoms and signs involving the digestive system and abdomen: Secondary | ICD-10-CM | POA: Diagnosis not present

## 2013-01-21 DIAGNOSIS — K591 Functional diarrhea: Secondary | ICD-10-CM | POA: Diagnosis not present

## 2013-03-10 ENCOUNTER — Ambulatory Visit: Payer: Self-pay

## 2013-03-16 DIAGNOSIS — K591 Functional diarrhea: Secondary | ICD-10-CM | POA: Diagnosis not present

## 2013-03-16 DIAGNOSIS — L290 Pruritus ani: Secondary | ICD-10-CM | POA: Diagnosis not present

## 2013-03-16 DIAGNOSIS — K589 Irritable bowel syndrome without diarrhea: Secondary | ICD-10-CM | POA: Diagnosis not present

## 2013-03-25 ENCOUNTER — Other Ambulatory Visit: Payer: Self-pay | Admitting: Physician Assistant

## 2013-03-25 DIAGNOSIS — D485 Neoplasm of uncertain behavior of skin: Secondary | ICD-10-CM | POA: Diagnosis not present

## 2013-03-25 DIAGNOSIS — L570 Actinic keratosis: Secondary | ICD-10-CM | POA: Diagnosis not present

## 2013-03-29 DIAGNOSIS — B9789 Other viral agents as the cause of diseases classified elsewhere: Secondary | ICD-10-CM | POA: Diagnosis not present

## 2013-04-20 DIAGNOSIS — E538 Deficiency of other specified B group vitamins: Secondary | ICD-10-CM | POA: Diagnosis not present

## 2013-04-20 DIAGNOSIS — G609 Hereditary and idiopathic neuropathy, unspecified: Secondary | ICD-10-CM | POA: Diagnosis not present

## 2013-04-20 DIAGNOSIS — K219 Gastro-esophageal reflux disease without esophagitis: Secondary | ICD-10-CM | POA: Diagnosis not present

## 2013-04-20 DIAGNOSIS — E559 Vitamin D deficiency, unspecified: Secondary | ICD-10-CM | POA: Diagnosis not present

## 2013-04-20 DIAGNOSIS — G252 Other specified forms of tremor: Secondary | ICD-10-CM | POA: Diagnosis not present

## 2013-04-20 DIAGNOSIS — E782 Mixed hyperlipidemia: Secondary | ICD-10-CM | POA: Diagnosis not present

## 2013-04-20 DIAGNOSIS — G250 Essential tremor: Secondary | ICD-10-CM | POA: Diagnosis not present

## 2013-04-20 DIAGNOSIS — M19049 Primary osteoarthritis, unspecified hand: Secondary | ICD-10-CM | POA: Diagnosis not present

## 2013-04-20 DIAGNOSIS — K591 Functional diarrhea: Secondary | ICD-10-CM | POA: Diagnosis not present

## 2013-06-16 ENCOUNTER — Other Ambulatory Visit: Payer: Self-pay | Admitting: Gastroenterology

## 2013-06-16 DIAGNOSIS — K591 Functional diarrhea: Secondary | ICD-10-CM | POA: Diagnosis not present

## 2013-06-16 DIAGNOSIS — R197 Diarrhea, unspecified: Secondary | ICD-10-CM

## 2013-06-22 ENCOUNTER — Ambulatory Visit
Admission: RE | Admit: 2013-06-22 | Discharge: 2013-06-22 | Disposition: A | Discharge status: Home / Self Care | Payer: Medicare Other | Source: Ambulatory Visit | Attending: Gastroenterology | Admitting: Gastroenterology

## 2013-06-22 DIAGNOSIS — R197 Diarrhea, unspecified: Secondary | ICD-10-CM

## 2013-06-22 DIAGNOSIS — K4090 Unilateral inguinal hernia, without obstruction or gangrene, not specified as recurrent: Secondary | ICD-10-CM | POA: Diagnosis not present

## 2013-06-22 MED ORDER — IOHEXOL 300 MG/ML  SOLN
100.0000 mL | Freq: Once | INTRAMUSCULAR | Status: AC | PRN
  Administered 2013-06-22: 100 mL via INTRAVENOUS

## 2013-07-20 DIAGNOSIS — J329 Chronic sinusitis, unspecified: Secondary | ICD-10-CM | POA: Diagnosis not present

## 2013-07-20 DIAGNOSIS — R059 Cough, unspecified: Secondary | ICD-10-CM | POA: Diagnosis not present

## 2013-07-20 DIAGNOSIS — R05 Cough: Secondary | ICD-10-CM | POA: Diagnosis not present

## 2013-07-20 DIAGNOSIS — J029 Acute pharyngitis, unspecified: Secondary | ICD-10-CM | POA: Diagnosis not present

## 2013-08-28 DIAGNOSIS — J029 Acute pharyngitis, unspecified: Secondary | ICD-10-CM | POA: Diagnosis not present

## 2013-09-15 DIAGNOSIS — K591 Functional diarrhea: Secondary | ICD-10-CM | POA: Diagnosis not present

## 2013-10-14 ENCOUNTER — Telehealth: Payer: Self-pay | Admitting: Neurology

## 2013-10-14 NOTE — Telephone Encounter (Signed)
Received refill request from [facility] for Clonazepam. Patient does not have a follow up. Please advise.

## 2013-10-15 NOTE — Telephone Encounter (Signed)
Denied.  Needs f/u if would like refills.  Been one year since f/u and I believe was due back in 6 months.

## 2013-10-21 DIAGNOSIS — K219 Gastro-esophageal reflux disease without esophagitis: Secondary | ICD-10-CM | POA: Diagnosis not present

## 2013-10-21 DIAGNOSIS — G252 Other specified forms of tremor: Secondary | ICD-10-CM | POA: Diagnosis not present

## 2013-10-21 DIAGNOSIS — G609 Hereditary and idiopathic neuropathy, unspecified: Secondary | ICD-10-CM | POA: Diagnosis not present

## 2013-10-21 DIAGNOSIS — R1310 Dysphagia, unspecified: Secondary | ICD-10-CM | POA: Diagnosis not present

## 2013-10-21 DIAGNOSIS — Z125 Encounter for screening for malignant neoplasm of prostate: Secondary | ICD-10-CM | POA: Diagnosis not present

## 2013-10-21 DIAGNOSIS — E782 Mixed hyperlipidemia: Secondary | ICD-10-CM | POA: Diagnosis not present

## 2013-10-21 DIAGNOSIS — E538 Deficiency of other specified B group vitamins: Secondary | ICD-10-CM | POA: Diagnosis not present

## 2013-10-21 DIAGNOSIS — Z0000 Encounter for general adult medical examination without abnormal findings: Secondary | ICD-10-CM | POA: Diagnosis not present

## 2013-10-21 DIAGNOSIS — G250 Essential tremor: Secondary | ICD-10-CM | POA: Diagnosis not present

## 2013-10-21 DIAGNOSIS — Z23 Encounter for immunization: Secondary | ICD-10-CM | POA: Diagnosis not present

## 2013-10-21 DIAGNOSIS — E559 Vitamin D deficiency, unspecified: Secondary | ICD-10-CM | POA: Diagnosis not present

## 2013-10-22 ENCOUNTER — Other Ambulatory Visit (HOSPITAL_COMMUNITY): Payer: Self-pay | Admitting: Cardiology

## 2013-10-22 DIAGNOSIS — I739 Peripheral vascular disease, unspecified: Secondary | ICD-10-CM

## 2013-10-23 ENCOUNTER — Ambulatory Visit (HOSPITAL_COMMUNITY): Payer: Medicare Other | Attending: Internal Medicine | Admitting: *Deleted

## 2013-10-23 DIAGNOSIS — I739 Peripheral vascular disease, unspecified: Secondary | ICD-10-CM | POA: Insufficient documentation

## 2013-10-23 DIAGNOSIS — Z87891 Personal history of nicotine dependence: Secondary | ICD-10-CM | POA: Diagnosis not present

## 2013-10-23 DIAGNOSIS — E785 Hyperlipidemia, unspecified: Secondary | ICD-10-CM | POA: Insufficient documentation

## 2013-10-23 DIAGNOSIS — E119 Type 2 diabetes mellitus without complications: Secondary | ICD-10-CM | POA: Insufficient documentation

## 2013-10-23 NOTE — Progress Notes (Signed)
Lower Arterial Doppler Complete

## 2013-10-27 ENCOUNTER — Telehealth: Payer: Self-pay | Admitting: Neurology

## 2013-10-27 NOTE — Telephone Encounter (Signed)
Pt needs a refill and called into [facility] [phone]

## 2013-10-28 NOTE — Telephone Encounter (Signed)
I wrote a note to the pharmacy a couple weeks ago with the medication denial. Note in chart. Called patient's wife back and appt made for tomorrow morning.

## 2013-10-28 NOTE — Telephone Encounter (Signed)
Not sure what medication he needs a refill on... But patient overdue for follow up and needs to schedule a follow up before any refills are given on any medications.

## 2013-10-28 NOTE — Telephone Encounter (Signed)
Did you call the patient and let them know that they needed a return appt? His wife stated that it was the only medication that DR Tat gave him.

## 2013-10-29 ENCOUNTER — Encounter: Payer: Self-pay | Admitting: Neurology

## 2013-10-29 ENCOUNTER — Ambulatory Visit (INDEPENDENT_AMBULATORY_CARE_PROVIDER_SITE_OTHER): Payer: Medicare Other | Admitting: Neurology

## 2013-10-29 VITALS — BP 118/62 | HR 64 | Temp 98.0°F | Ht 69.0 in | Wt 176.1 lb

## 2013-10-29 DIAGNOSIS — G250 Essential tremor: Secondary | ICD-10-CM

## 2013-10-29 DIAGNOSIS — G609 Hereditary and idiopathic neuropathy, unspecified: Secondary | ICD-10-CM | POA: Diagnosis not present

## 2013-10-29 DIAGNOSIS — G4719 Other hypersomnia: Secondary | ICD-10-CM

## 2013-10-29 DIAGNOSIS — G252 Other specified forms of tremor: Secondary | ICD-10-CM

## 2013-10-29 DIAGNOSIS — G4710 Hypersomnia, unspecified: Secondary | ICD-10-CM

## 2013-10-29 MED ORDER — CLONAZEPAM 0.5 MG PO TABS: 0.5000 mg | ORAL_TABLET | Freq: Every day | ORAL | Status: DC

## 2013-10-29 MED ORDER — PRIMIDONE 50 MG PO TABS: 100.0000 mg | ORAL_TABLET | Freq: Every day | ORAL | Status: DC

## 2013-10-29 NOTE — Progress Notes (Signed)
Subjective:   Stephen Anderson was seen in consultation in the movement disorder clinic.  The evaluation is for tremor.  His PCP is [name],[name], MD.   The patient is a 73 y.o. right handed male with a history of tremor.Onset of symptoms was 30+ years ago.   Tremor primarily involves the bilateral hand and chin.   Since he quit smoking 4 years ago, his hand tremor went away.   Tremor exacerbated by pressure on the chin.   He is not biting himself because of the tremor.   It only bothers him a little but it bothers his wife.  There is a family hx of tremor in his mother.  He is on primidone and has been on it for 30 years.  He is on 50 mg, 2 tablets twice per day.  He thinks that it helps.  He reports arm/hand and leg paresthesias for years.  He has definite paresthesias from the knee down.  He is on neurontin for this.  He has some loss of balance.  He does not fall on the level ground.  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    12/01/12 update:  I have not seen the pt since Feb.  He is on primidone 100 mg bid and lat visit klonopin was added.  He is only taking 1/2 tablet at night and it works well.  He has had some sleepiness, but does not think that it is related to the Klonopin.  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  He thinks that it may just be because he works hard.  He is up on ladders.  He would like to try and get down on some of his medications.  10/29/13 update:  The patient returns today for followup.  It has been approximately one year since I have seen the patient.  He has a history of essential tremor.  Last visit, I did decrease his morning dosage of primidone because of sleepiness but he didn't do that and is on 100 mg bid (is supposed to be on 100 q hs).  He is also on clonazepam 0.5 mg, half a tablet at night, that he primarily uses for sleep.  He called me recently for refill of that, but he was told that he needed to followup  before that was refilled.  The patient reports overall he has done well over the last year.  Symptoms have been fairly stable.  No falls.  No hallucinations.   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  He goes to bed b/w 9-10 pm and awakens one time to use the restroom but has trouble falling back asleep. He will watch TV to get back to sleep for 30 mins.  He snores.  He wakes himself up with the snorting.  He gets up about 6:30 AM to an alarm clock but even without it, he doesn't sleep in.  He feels refreshed until he eats breakfast and then he feels like he could fall back asleep but he works as a Curator still.  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  He has to keep a cup of crushed ice near him while driving and that keeps him awake.  He doesn't fall asleep while eating.    He had labs since last visit for PN.  His B12 was 506, folate 13.8, RPR was negative and there was no monoclonal proteins in the serum or urinary protein electrophoresis.    He did have diverticulitis since our last visit (3 weeks ago).  He is off antibiotics.  He is not completely back to normal.  Current/Previously tried tremor medications: primidone  Current medications that may exacerbate tremor:  n/a   Allergies  Allergen Reactions  . Oxycontin [Oxycodone Hcl] Hives    Current Outpatient Prescriptions on File Prior to Visit  Medication Sig Dispense Refill  . cholecalciferol (VITAMIN D) 1000 UNITS tablet Take 1,000 Units by mouth daily.      . clonazePAM (KLONOPIN) 0.5 MG tablet 1/2 to 1 tablet at night  30 tablet  5  . CRESTOR 5 MG tablet       . omeprazole (PRILOSEC) 40 MG capsule       . primidone (MYSOLINE) 50 MG tablet       . vitamin B-12 (CYANOCOBALAMIN) 100 MCG tablet Take 100 mcg by mouth daily.       No current facility-administered medications on file prior to visit.    Past Medical History  Diagnosis Date   . Hypercholesteremia   . Reflux   . Essential tremor     Past Surgical History  Procedure Laterality Date  . Gallbladder surgery    . Hernia repair      bilateral  . Cataract extraction, bilateral      History   Social History  . Marital Status: Married    Spouse Name: N/A    Number of Children: N/A  . Years of Education: N/A   Occupational History  . retired     Nurse, mental health order; painting   Social History Main Topics  . Smoking status: Former Smoker    Quit date: 04/06/2008  . Smokeless tobacco: Former Systems developer    Quit date: 05/16/2002     Comment: prior hx of tobacco 55 years  . Alcohol Use: No  . Drug Use: No  . Sexual Activity: Not on file   Other Topics Concern  . Not on file   Social History Narrative  . No narrative on file    Family Status  Relation Status Death Age  . Mother Alive     tremor  . Father Alive     healthy  . Brother Alive     2, MI  . Sister Deceased 36 months old    unknown  . Child Alive     2, healthy    Review of Systems A complete 10 system ROS was obtained and was negative apart from what is mentioned.   Objective:   VITALS:   Filed Vitals:   10/29/13 0940  BP: 118/62  Pulse: 64  Temp: 98 F (36.7 C)  Height: 5\' 9"  (1.753 m)  Weight: 176 lb 1.6 oz (79.878 kg)   Gen:  Appears stated age and in NAD. HEENT:  Normocephalic, atraumatic. The mucous membranes are moist. The superficial temporal arteries are without ropiness or tenderness. Cardiovascular: Regular rate and rhythm. Lungs: Clear to auscultation bilaterally. Neck: There are no carotid bruits noted bilaterally.  NEUROLOGICAL:  Orientation:  The patient is alert and oriented x 3.  Recent and remote memory are intact.  Attention span and concentration are normal.  Able to name objects and repeat without trouble.  Fund of knowledge is appropriate Cranial nerves: There is good facial symmetry. The pupils are equal round and reactive to light bilaterally.  Fundoscopic exam reveals clear disc margins bilaterally. Extraocular muscles are intact and visual fields are full to confrontational testing. Speech is fluent and clear. Soft palate rises symmetrically and there is no tongue deviation. Hearing is intact to conversational tone. Tone: Tone is good throughout. Sensation: Sensation is intact to light touch and pinprick throughout (facial, trunk, extremities). Vibration is decreased distally. There is no extinction with double simultaneous stimulation. There is no sensory dermatomal level identified. Coordination:  The patient has no dysdiadichokinesia or dysmetria. Motor: Strength is 5/5 in the bilateral upper and lower extremities.  Shoulder shrug is equal bilaterally.  There is no pronator drift.  There are no fasciculations noted. DTR's: Deep tendon reflexes are 2/4 at the bilateral biceps, triceps, brachioradialis, patella and achilles.  Plantar responses are downgoing bilaterally. Gait and Station: The patient is able to ambulate without difficulty.   MOVEMENT EXAM: Tremor:  There is virtually no tremor in the UE. A intermittent, mild chin tremor is present.        Assessment/Plan   1.  Essential Tremor  -This is somewhat atypical in that the chin tremor is most prominent.  However, given the fact that he has a family history of tremor and it has been present in him for over 30 years, this likely represents a form of essential tremor, and not just benign chin tremor.  -He will continue with the primidone 100 mg bid for now, even though he was supposed to be only on q hs.  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  Clonazepam has worked well and he will continue with half tablet at night. Risks, benefits, side effects and alternative therapies were discussed.  The opportunity to ask questions was given and they were answered to the best of my ability.  The patient expressed understanding and  willingness to follow the outlined treatment protocols. 2.  Peripheral neuropathy, idiopathic.  -His labs were unremarkable.  I did talk to him about safety, especially since he climbs up and down ladders.  I would like to see him not to do that. 3. EDS  -He will have a PSG.  I am concerned for OSAS.  He and I had a long discussion re: morbidity and mortality re: untreated sleep apnea.  He is resistant to the idea of CPAP but willing to at least undergo the study and consider the CPAP if needed.  Greater than 50% of visit in counseling.   4.  F/u in 6 months.

## 2013-10-29 NOTE — Patient Instructions (Addendum)
1. Please decrease Primidone 50 mg to two tablets at night. A refill has been sent to your pharmacy.  2. Continue Clonazepam 0.5 mg 1/2-1 tablet at bedtime. RX given.  3. Sleep study scheduled at [facility] on 12/27/2013 at 8:00 pm. If this appt date/time does not work please call [phone] to reschedule. They will mail a packet to you with details about appt.  4. Follow up 6 months.

## 2013-10-29 NOTE — Addendum Note (Signed)
Addended byAnnamaria Helling on: 10/29/2013 10:58 AM   Modules accepted: Orders

## 2013-11-30 ENCOUNTER — Ambulatory Visit (HOSPITAL_BASED_OUTPATIENT_CLINIC_OR_DEPARTMENT_OTHER): Payer: Medicare Other | Attending: Neurology | Admitting: Radiology

## 2013-11-30 VITALS — Ht 69.5 in | Wt 176.0 lb

## 2013-11-30 DIAGNOSIS — G4719 Other hypersomnia: Secondary | ICD-10-CM

## 2013-11-30 DIAGNOSIS — I491 Atrial premature depolarization: Secondary | ICD-10-CM | POA: Insufficient documentation

## 2013-11-30 DIAGNOSIS — G4733 Obstructive sleep apnea (adult) (pediatric): Secondary | ICD-10-CM | POA: Insufficient documentation

## 2013-12-03 DIAGNOSIS — R1310 Dysphagia, unspecified: Secondary | ICD-10-CM | POA: Diagnosis not present

## 2013-12-09 DIAGNOSIS — Z961 Presence of intraocular lens: Secondary | ICD-10-CM | POA: Diagnosis not present

## 2013-12-09 DIAGNOSIS — H524 Presbyopia: Secondary | ICD-10-CM | POA: Diagnosis not present

## 2013-12-09 DIAGNOSIS — G4730 Sleep apnea, unspecified: Secondary | ICD-10-CM

## 2013-12-09 DIAGNOSIS — H02839 Dermatochalasis of unspecified eye, unspecified eyelid: Secondary | ICD-10-CM | POA: Diagnosis not present

## 2013-12-09 DIAGNOSIS — H52209 Unspecified astigmatism, unspecified eye: Secondary | ICD-10-CM | POA: Diagnosis not present

## 2013-12-09 DIAGNOSIS — G4710 Hypersomnia, unspecified: Secondary | ICD-10-CM | POA: Diagnosis not present

## 2013-12-09 NOTE — Sleep Study (Signed)
   NAME: Stephen Anderson DATE OF BIRTH:  05-Mar-1941 MEDICAL RECORD NUMBER [id number]  LOCATION: [facility]  PHYSICIAN: Kathee Delton  DATE OF STUDY: 11/30/2013  SLEEP STUDY TYPE: Nocturnal Polysomnogram               REFERRING PHYSICIAN: Tat, Rebecca S, DO  INDICATION FOR STUDY: Hypersomnia with sleep apnea  EPWORTH SLEEPINESS SCORE:  24 HEIGHT: 5' 9.5" (176.5 cm)  WEIGHT: 176 lb (79.833 kg)    Body mass index is 25.63 kg/(m^2).  NECK SIZE: 17 in.  MEDICATIONS: Reviewed in the sleep record  SLEEP ARCHITECTURE: The patient had a total sleep time of 334 minutes with no slow-wave sleep and decreased quantity of REM. Sleep onset latency was mildly prolonged at 39 minutes, and REM onset was normal at 101 minutes. Sleep efficiency was mildly reduced at 82%  RESPIRATORY DATA: The patient was found to have 81 apneas and 37 obstructive hypopneas, giving him an AHI of 21 events per hour. The events occurred primarily in the supine position, and there was moderate snoring noted throughout.  OXYGEN DATA: There was transient oxygen desaturation as low as 84% with the patient's obstructive events  CARDIAC DATA: Rare PAC noted  MOVEMENT/PARASOMNIA: No significant periodic limb movements seen, and there were no abnormal behaviors noted.  IMPRESSION/ RECOMMENDATION:    1) moderate obstructive sleep apnea/hypopnea syndrome, with an AHI of 21 events per hour and oxygen desaturation as low as 84%. Treatment for this degree of sleep apnea can include a trial of weight loss alone if applicable, upper airway surgery, dental appliance, and also CPAP. Clinical correlation is suggested.  2) rare PAC noted, but no clinically significant arrhythmias were seen     Kathee Delton Diplomate, American Board of Sleep Medicine  ELECTRONICALLY SIGNED ON:  12/09/2013, 6:04 PM [place] PH: [phone]   FX: [phone] [place]

## 2013-12-10 ENCOUNTER — Telehealth: Payer: Self-pay | Admitting: Neurology

## 2013-12-10 DIAGNOSIS — G4733 Obstructive sleep apnea (adult) (pediatric): Secondary | ICD-10-CM

## 2013-12-10 NOTE — Telephone Encounter (Signed)
Left message on machine for patient to call back. To make him aware that his sleep study showed sleep apnea and he needs a CPAP titration study. Awaiting call back.

## 2013-12-10 NOTE — Telephone Encounter (Signed)
CPAP titration set up for 01/31/2014 at 8:00 pm. Patient's wife made aware.

## 2013-12-15 ENCOUNTER — Ambulatory Visit (HOSPITAL_BASED_OUTPATIENT_CLINIC_OR_DEPARTMENT_OTHER): Payer: Medicare Other | Attending: Internal Medicine

## 2013-12-15 VITALS — Ht 69.0 in | Wt 176.0 lb

## 2013-12-15 DIAGNOSIS — G4769 Other sleep related movement disorders: Secondary | ICD-10-CM | POA: Insufficient documentation

## 2013-12-15 DIAGNOSIS — Z9989 Dependence on other enabling machines and devices: Secondary | ICD-10-CM

## 2013-12-15 DIAGNOSIS — G4730 Sleep apnea, unspecified: Secondary | ICD-10-CM | POA: Diagnosis present

## 2013-12-15 DIAGNOSIS — G4733 Obstructive sleep apnea (adult) (pediatric): Secondary | ICD-10-CM | POA: Diagnosis not present

## 2013-12-15 DIAGNOSIS — G4710 Hypersomnia, unspecified: Secondary | ICD-10-CM | POA: Diagnosis present

## 2013-12-15 DIAGNOSIS — I491 Atrial premature depolarization: Secondary | ICD-10-CM | POA: Insufficient documentation

## 2013-12-21 DIAGNOSIS — G4710 Hypersomnia, unspecified: Secondary | ICD-10-CM

## 2013-12-21 DIAGNOSIS — G4730 Sleep apnea, unspecified: Secondary | ICD-10-CM

## 2013-12-21 NOTE — Sleep Study (Signed)
   NAME: Stephen Anderson DATE OF BIRTH:  08/30/1940 MEDICAL RECORD NUMBER [id number]  LOCATION: [facility]  PHYSICIAN: Kathee Delton  DATE OF STUDY: 12/15/2013  SLEEP STUDY TYPE: Nocturnal Polysomnogram               REFERRING PHYSICIAN: Tat, Rebecca S, DO  INDICATION FOR STUDY: Hypersomnia with sleep apnea  EPWORTH SLEEPINESS SCORE:  24 HEIGHT: 5\' 9"  (175.3 cm)  WEIGHT: 176 lb (79.833 kg)    Body mass index is 25.98 kg/(m^2).  NECK SIZE: 17 in.  MEDICATIONS: Reviewed in the sleep record  SLEEP ARCHITECTURE: The patient had a total sleep time of 285 minutes, with no slow-wave sleep and only 52 minutes of REM. Sleep onset latency was mildly prolonged at 35 minutes, and REM onset was normal sleep efficiency was moderately reduced at 78%.  RESPIRATORY DATA: The patient underwent a CPAP titration study with a medium Fischer Paykel Simplus full face mask.  The pressure was increased in order to treat both obstructive events and snoring and the patient was found to have a therapeutic level at 8 cm of water.  OXYGEN DATA: Transient oxygen desaturation as low as 88% prior to reaching therapeutic CPAP.  CARDIAC DATA: Isolated PACs were noted  MOVEMENT/PARASOMNIA: The patient had large numbers of leg jerks that for the most part were not periodic in nature, and there was no significant arousals or awakenings.  There were no abnormal behaviors noted.  IMPRESSION/ RECOMMENDATION:    1) good control of previously documented obstructive sleep apnea with 8 cm of CPAP, and delivered by a medium Fischer Paykel Simplus full face mask.  2) isolated PACs noted, but no clinically significant arrhythmias were seen  3) large numbers of leg jerks that were for the most part not periodic in nature or associated with disrupted sleep. Clinical correlation is suggested.     Kathee Delton Diplomate, American Board of Sleep Medicine  ELECTRONICALLY SIGNED ON:  12/21/2013, 5:32  PM [place] PH: [phone]   FX: [phone] [place]

## 2013-12-22 ENCOUNTER — Telehealth: Payer: Self-pay | Admitting: Neurology

## 2013-12-22 DIAGNOSIS — G4733 Obstructive sleep apnea (adult) (pediatric): Secondary | ICD-10-CM

## 2013-12-22 NOTE — Telephone Encounter (Signed)
Jade, please get pt set up with 8 cm of CPAP, and delivered by a medium Fischer Paykel Simplus full face mask with heated humidifier.  Let pt know that someone from home company will be contacting him to set up

## 2013-12-22 NOTE — Telephone Encounter (Signed)
Order placed in the computer and to fax to [facility] for them to call patient to set up. Called and spoke with patient's wife - and patient does not want to start CPAP until speaking with Dr Tat. I tried to explain how important this is- but they want to discuss with Dr Tat. Appt made to discuss next week. Will hold referral until then.

## 2013-12-27 ENCOUNTER — Encounter (HOSPITAL_BASED_OUTPATIENT_CLINIC_OR_DEPARTMENT_OTHER): Payer: Medicare Other

## 2013-12-30 ENCOUNTER — Ambulatory Visit (INDEPENDENT_AMBULATORY_CARE_PROVIDER_SITE_OTHER): Payer: Medicare Other | Admitting: Neurology

## 2013-12-30 ENCOUNTER — Encounter: Payer: Self-pay | Admitting: Neurology

## 2013-12-30 ENCOUNTER — Telehealth: Payer: Self-pay | Admitting: Neurology

## 2013-12-30 VITALS — BP 120/60 | HR 72 | Ht 69.5 in | Wt 176.0 lb

## 2013-12-30 DIAGNOSIS — G252 Other specified forms of tremor: Secondary | ICD-10-CM

## 2013-12-30 DIAGNOSIS — G4733 Obstructive sleep apnea (adult) (pediatric): Secondary | ICD-10-CM | POA: Diagnosis not present

## 2013-12-30 DIAGNOSIS — G250 Essential tremor: Secondary | ICD-10-CM | POA: Diagnosis not present

## 2013-12-30 NOTE — Telephone Encounter (Signed)
CPAP order faxed to [facility] at [phone] with confirmation received. Patient now ready to start CPAP. [facility] made aware.

## 2013-12-30 NOTE — Progress Notes (Signed)
Subjective:   Stephen Anderson was seen in consultation in the movement disorder clinic.  The evaluation is for tremor.  His PCP is [name],[name], MD.   The patient is a 73 y.o. right handed male with a history of tremor.Onset of symptoms was 30+ years ago.   Tremor primarily involves the bilateral hand and chin.   Since he quit smoking 4 years ago, his hand tremor went away.   Tremor exacerbated by pressure on the chin.   He is not biting himself because of the tremor.   It only bothers him a little but it bothers his wife.  There is a family hx of tremor in his mother.  He is on primidone and has been on it for 30 years.  He is on 50 mg, 2 tablets twice per day.  He thinks that it helps.  He reports arm/hand and leg paresthesias for years.  He has definite paresthesias from the knee down.  He is on neurontin for this.  He has some loss of balance.  He does not fall on the level ground.  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    12/01/12 update:  I have not seen the pt since Feb.  He is on primidone 100 mg bid and lat visit klonopin was added.  He is only taking 1/2 tablet at night and it works well.  He has had some sleepiness, but does not think that it is related to the Klonopin.  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  He thinks that it may just be because he works hard.  He is up on ladders.  He would like to try and get down on some of his medications.  10/29/13 update:  The patient returns today for followup.  It has been approximately one year since I have seen the patient.  He has a history of essential tremor.  Last visit, I did decrease his morning dosage of primidone because of sleepiness but he didn't do that and is on 100 mg bid (is supposed to be on 100 q hs).  He is also on clonazepam 0.5 mg, half a tablet at night, that he primarily uses for sleep.  He called me recently for refill of that, but he was told that he needed to followup  before that was refilled.  The patient reports overall he has done well over the last year.  Symptoms have been fairly stable.  No falls.  No hallucinations.   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  He goes to bed b/w 9-10 pm and awakens one time to use the restroom but has trouble falling back asleep. He will watch TV to get back to sleep for 30 mins.  He snores.  He wakes himself up with the snorting.  He gets up about 6:30 AM to an alarm clock but even without it, he doesn't sleep in.  He feels refreshed until he eats breakfast and then he feels like he could fall back asleep but he works as a Curator still.  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  He has to keep a cup of crushed ice near him while driving and that keeps him awake.  He doesn't fall asleep while eating.    He had labs since last visit for PN.  His B12 was 506, folate 13.8, RPR was negative and there was no monoclonal proteins in the serum or urinary protein electrophoresis.    He did have diverticulitis since our last visit (3 weeks ago).  He is off antibiotics.  He is not completely back to normal.  12/30/13 update:  The patient is returning today for followup.  He is on primidone 100 mg twice a day for his essential tremor.  States that tremor is well controlled.  Is on 1/2 tablet of 0.5 mg clonazepam for insomnia.  However, the patient was complaining of excessive daytime hypersomnolence.  Therefore, last visit I sent him for a nocturnal polysomnogram.  His apnea hypoxia index was 21 with an O2 nadir of 84%.  CPAP was recommended at 8.  The patient did not want to proceed with that, so he presents today to talk further about that.  Pt states that the night that he did the cpap trial he had awful headache the following day and for 4 days after.  Worried about trying cpap because of that.  Current/Previously tried tremor medications:  primidone  Current medications that may exacerbate tremor:  n/a   Allergies  Allergen Reactions  . Oxycontin [Oxycodone Hcl] Hives    Current Outpatient Prescriptions on File Prior to Visit  Medication Sig Dispense Refill  . aspirin 81 MG tablet Take 81 mg by mouth daily.      Marland Kitchen atorvastatin (LIPITOR) 40 MG tablet Take 40 mg by mouth daily.      . cholecalciferol (VITAMIN D) 1000 UNITS tablet Take 1,000 Units by mouth daily.      . clonazePAM (KLONOPIN) 0.5 MG tablet Take 1 tablet (0.5 mg total) by mouth at bedtime. Take 1/2 tablet by mouth every night  30 tablet  5  . hydrocortisone cream 1 % 1 application as directed.      Marland Kitchen omeprazole (PRILOSEC) 40 MG capsule Take 40 mg by mouth daily.       . primidone (MYSOLINE) 50 MG tablet Take 2 tablets (100 mg total) by mouth at bedtime.  60 tablet  5  . vitamin B-12 (CYANOCOBALAMIN) 100 MCG tablet Take 100 mcg by mouth daily.       No current facility-administered medications on file prior to visit.    Past Medical History  Diagnosis Date  . Hypercholesteremia   . Reflux   . Essential tremor     Past Surgical History  Procedure Laterality Date  . Gallbladder surgery    . Hernia repair      bilateral  . Cataract extraction, bilateral      History   Social History  . Marital Status: Married    Spouse Name: N/A    Number of Children: N/A  . Years of Education: N/A   Occupational History  . retired     Nurse, mental health order; painting   Social History Main Topics  . Smoking status: Former Smoker    Quit date: 04/06/2008  . Smokeless tobacco: Former Systems developer    Quit date: 05/16/2002     Comment: prior hx of tobacco 55 years  . Alcohol Use: No  . Drug Use: No  . Sexual Activity: Not on file   Other Topics Concern  . Not on file   Social History Narrative  . No narrative on file    Family Status  Relation Status Death Age  . Mother Alive     tremor  . Father Alive     healthy  .  Brother Alive     2, MI  . Sister  Deceased 59 months old    unknown  . Child Alive     2, healthy    Review of Systems A complete 10 system ROS was obtained and was negative apart from what is mentioned.   Objective:   VITALS:   Filed Vitals:   12/30/13 1014  BP: 120/60  Pulse: 72  Height: 5' 9.5" (1.765 m)  Weight: 176 lb (79.833 kg)   Gen:  Appears stated age and in NAD. HEENT:  Normocephalic, atraumatic. The mucous membranes are moist. The superficial temporal arteries are without ropiness or tenderness. Cardiovascular: Regular rate and rhythm. Lungs: Clear to auscultation bilaterally. Neck: There are no carotid bruits noted bilaterally.  NEUROLOGICAL:  Orientation:  The patient is alert and oriented x 3.  Recent and remote memory are intact.  Attention span and concentration are normal.  Able to name objects and repeat without trouble.  Fund of knowledge is appropriate Cranial nerves: There is good facial symmetry. The pupils are equal round and reactive to light bilaterally. Fundoscopic exam reveals clear disc margins bilaterally. Extraocular muscles are intact and visual fields are full to confrontational testing. Speech is fluent and clear. Soft palate rises symmetrically and there is no tongue deviation. Hearing is intact to conversational tone. Tone: Tone is good throughout. Coordination:  The patient has no dysdiadichokinesia or dysmetria. Motor: Strength is 5/5 in the bilateral upper and lower extremities.  Shoulder shrug is equal bilaterally.  There is no pronator drift.  There are no fasciculations noted. DTR's: Deep tendon reflexes are 2/4 at the bilateral biceps, triceps, brachioradialis, patella and achilles.  Plantar responses are downgoing bilaterally. Gait and Station: The patient is able to ambulate without difficulty.   MOVEMENT EXAM: Tremor:  There is virtually no tremor in the UE.  chin tremor is present.        Assessment/Plan   1.  Essential Tremor  -This is somewhat atypical in that  the chin tremor is most prominent.  However, given the fact that he has a family history of tremor and it has been present in him for over 30 years, this likely represents a form of essential tremor, and not just benign chin tremor.  -He will continue with the primidone 100 mg bid for now, even though he was supposed to be only on q hs.  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  Clonazepam has worked well and he will continue with half tablet at night. Risks, benefits, side effects and alternative therapies were discussed.  The opportunity to ask questions was given and they were answered to the best of my ability.  The patient expressed understanding and willingness to follow the outlined treatment protocols. 2.  Peripheral neuropathy, idiopathic.  -His labs were unremarkable.  I did talk to him about safety, especially since he climbs up and down ladders.  I would like to see him not to do that. 3. obstructive sleep apnea syndrome, overall moderate.  -I had a long discussion with the patient today regarding untreated sleep apnea.  Greater than 50% of the 35 min visit was spent in counseling.  I talked about morbidity and mortality associated with untreated sleep apnea.  We talked about CPAP.  We discussed surgical interventions as well, if he is unable to tolerate CPAP.  He is willing to try CPAP with heated humidifier.  He  worries about having a headache as he did the night of the trial, but if so, I told him I would call him in either Nasonex or Flonase. 4.  F/u in January at his previously scheduled appointment.

## 2014-01-31 ENCOUNTER — Encounter (HOSPITAL_BASED_OUTPATIENT_CLINIC_OR_DEPARTMENT_OTHER): Payer: Medicare Other

## 2014-02-16 ENCOUNTER — Ambulatory Visit: Payer: Medicare Other | Admitting: Neurology

## 2014-02-24 ENCOUNTER — Other Ambulatory Visit: Payer: Self-pay | Admitting: Physician Assistant

## 2014-02-24 DIAGNOSIS — C44621 Squamous cell carcinoma of skin of unspecified upper limb, including shoulder: Secondary | ICD-10-CM | POA: Diagnosis not present

## 2014-02-24 DIAGNOSIS — D485 Neoplasm of uncertain behavior of skin: Secondary | ICD-10-CM | POA: Diagnosis not present

## 2014-02-24 DIAGNOSIS — C4492 Squamous cell carcinoma of skin, unspecified: Secondary | ICD-10-CM

## 2014-02-24 DIAGNOSIS — D229 Melanocytic nevi, unspecified: Secondary | ICD-10-CM

## 2014-02-24 DIAGNOSIS — D225 Melanocytic nevi of trunk: Secondary | ICD-10-CM | POA: Diagnosis not present

## 2014-02-24 DIAGNOSIS — L570 Actinic keratosis: Secondary | ICD-10-CM | POA: Diagnosis not present

## 2014-02-24 DIAGNOSIS — C44622 Squamous cell carcinoma of skin of right upper limb, including shoulder: Secondary | ICD-10-CM | POA: Diagnosis not present

## 2014-02-24 HISTORY — DX: Squamous cell carcinoma of skin, unspecified: C44.92

## 2014-02-24 HISTORY — DX: Melanocytic nevi, unspecified: D22.9

## 2014-02-25 ENCOUNTER — Ambulatory Visit (INDEPENDENT_AMBULATORY_CARE_PROVIDER_SITE_OTHER): Payer: Medicare Other | Admitting: Neurology

## 2014-02-25 ENCOUNTER — Encounter: Payer: Self-pay | Admitting: Neurology

## 2014-02-25 VITALS — BP 126/64 | HR 67 | Ht 69.5 in | Wt 182.0 lb

## 2014-02-25 DIAGNOSIS — G250 Essential tremor: Secondary | ICD-10-CM

## 2014-02-25 DIAGNOSIS — G4762 Sleep related leg cramps: Secondary | ICD-10-CM

## 2014-02-25 DIAGNOSIS — G4733 Obstructive sleep apnea (adult) (pediatric): Secondary | ICD-10-CM

## 2014-02-25 DIAGNOSIS — G5621 Lesion of ulnar nerve, right upper limb: Secondary | ICD-10-CM

## 2014-02-25 DIAGNOSIS — G4719 Other hypersomnia: Secondary | ICD-10-CM

## 2014-02-25 NOTE — Patient Instructions (Signed)
1. Take Primidone 50 mg - 1 in the morning, 1 in the evening. We will talk in 3 weeks to see how you are doing.  2. Drink some tonic water before bed (you can get this over the counter) to help with leg cramps.

## 2014-02-25 NOTE — Progress Notes (Signed)
Subjective:   Stephen Anderson was seen in consultation in the movement disorder clinic.  The evaluation is for tremor.  His PCP is [name],[name], MD.   The patient is a 73 y.o. right handed male with a history of tremor.Onset of symptoms was 30+ years ago.   Tremor primarily involves the bilateral hand and chin.   Since he quit smoking 4 years ago, his hand tremor went away.   Tremor exacerbated by pressure on the chin.   He is not biting himself because of the tremor.   It only bothers him a little but it bothers his wife.  There is a family hx of tremor in his mother.  He is on primidone and has been on it for 30 years.  He is on 50 mg, 2 tablets twice per day.  He thinks that it helps.  He reports arm/hand and leg paresthesias for years.  He has definite paresthesias from the knee down.  He is on neurontin for this.  He has some loss of balance.  He does not fall on the level ground.  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    12/01/12 update:  I have not seen the pt since Feb.  He is on primidone 100 mg bid and lat visit klonopin was added.  He is only taking 1/2 tablet at night and it works well.  He has had some sleepiness, but does not think that it is related to the Klonopin.  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  He thinks that it may just be because he works hard.  He is up on ladders.  He would like to try and get down on some of his medications.  10/29/13 update:  The patient returns today for followup.  It has been approximately one year since I have seen the patient.  He has a history of essential tremor.  Last visit, I did decrease his morning dosage of primidone because of sleepiness but he didn't do that and is on 100 mg bid (is supposed to be on 100 q hs).  He is also on clonazepam 0.5 mg, half a tablet at night, that he primarily uses for sleep.  He called me recently for refill of that, but he was told that he needed to followup  before that was refilled.  The patient reports overall he has done well over the last year.  Symptoms have been fairly stable.  No falls.  No hallucinations.   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  He goes to bed b/w 9-10 pm and awakens one time to use the restroom but has trouble falling back asleep. He will watch TV to get back to sleep for 30 mins.  He snores.  He wakes himself up with the snorting.  He gets up about 6:30 AM to an alarm clock but even without it, he doesn't sleep in.  He feels refreshed until he eats breakfast and then he feels like he could fall back asleep but he works as a Curator still.  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  He has to keep a cup of crushed ice near him while driving and that keeps him awake.  He doesn't fall asleep while eating.    He had labs since last visit for PN.  His B12 was 506, folate 13.8, RPR was negative and there was no monoclonal proteins in the serum or urinary protein electrophoresis.    He did have diverticulitis since our last visit (3 weeks ago).  He is off antibiotics.  He is not completely back to normal.  12/30/13 update:  The patient is returning today for followup.  He is on primidone 100 mg twice a day for his essential tremor.  States that tremor is well controlled.  Is on 1/2 tablet of 0.5 mg clonazepam for insomnia.  However, the patient was complaining of excessive daytime hypersomnolence.  Therefore, last visit I sent him for a nocturnal polysomnogram.  His apnea hypoxia index was 21 with an O2 nadir of 84%.  CPAP was recommended at 8.  The patient did not want to proceed with that, so he presents today to talk further about that.  Pt states that the night that he did the cpap trial he had awful headache the following day and for 4 days after.  Worried about trying cpap because of that.  02/25/14 update: Pt returns for f/u.  Wearing CPAP  faithfully (I reviewed compliance report) and despite that, continues to have EDS.  CPAP causes dry mouth.  He is on primidone, 100mg  in the AM but he stopped the 100 at night.  Still sleepy.  Takes 1/2 tablet of klonopin at night.  If sits during the daytime, will fall asleep.  Has to carry a cup of ice in order to drive so doesn't fall asleep and as long as he has that, can stay awake.  Also c/o leg cramping at night.  Also c/o R pinky and ring paresthesias, especially with painting and resting arm on a chair.  Current/Previously tried tremor medications: primidone  Current medications that may exacerbate tremor:  n/a   Allergies  Allergen Reactions  . Oxycontin [Oxycodone Hcl] Hives    Current Outpatient Prescriptions on File Prior to Visit  Medication Sig Dispense Refill  . aspirin 81 MG tablet Take 81 mg by mouth daily.    Marland Kitchen atorvastatin (LIPITOR) 40 MG tablet Take 40 mg by mouth daily.    Marland Kitchen CHERRY PO Take by mouth daily.    . cholecalciferol (VITAMIN D) 1000 UNITS tablet Take 1,000 Units by mouth daily.    . clonazePAM (KLONOPIN) 0.5 MG tablet Take 1 tablet (0.5 mg total) by mouth at bedtime. Take 1/2 tablet by mouth every night 30 tablet 5  . hydrocortisone cream 1 % 1 application as directed.    Marland Kitchen omeprazole (PRILOSEC) 40 MG capsule Take 40 mg by mouth daily.     . primidone (MYSOLINE) 50 MG tablet Take 2 tablets (100 mg total) by mouth at bedtime. 60 tablet 5  . vitamin B-12 (CYANOCOBALAMIN) 100 MCG tablet Take 100 mcg by mouth daily.     No current facility-administered medications on file prior to visit.    Past Medical History  Diagnosis Date  . Hypercholesteremia   . Reflux   . Essential tremor     Past Surgical History  Procedure Laterality Date  . Gallbladder surgery    . Hernia repair      bilateral  . Cataract extraction, bilateral      History   Social History  . Marital Status: Married    Spouse Name: N/A    Number of Children: N/A  . Years of  Education: N/A   Occupational History  . retired     Nurse, mental health order; painting  Social History Main Topics  . Smoking status: Former Smoker    Quit date: 04/06/2008  . Smokeless tobacco: Former Systems developer    Quit date: 05/16/2002     Comment: prior hx of tobacco 55 years  . Alcohol Use: No  . Drug Use: No  . Sexual Activity: Not on file   Other Topics Concern  . Not on file   Social History Narrative    Family Status  Relation Status Death Age  . Mother Alive     tremor  . Father Alive     healthy  . Brother Alive     2, MI  . Sister Deceased 72 months old    unknown  . Child Alive     2, healthy    Review of Systems A complete 10 system ROS was obtained and was negative apart from what is mentioned.   Objective:   VITALS:   Filed Vitals:   02/25/14 0804  BP: 126/64  Pulse: 67  Height: 5' 9.5" (1.765 m)  Weight: 182 lb (82.555 kg)   Gen:  Appears stated age and in NAD. HEENT:  Normocephalic, atraumatic. The mucous membranes are moist. The superficial temporal arteries are without ropiness or tenderness. Cardiovascular: Regular rate and rhythm. Lungs: Clear to auscultation bilaterally. Neck: There are no carotid bruits noted bilaterally.  NEUROLOGICAL:  Orientation:  The patient is alert and oriented x 3.  Recent and remote memory are intact.  Attention span and concentration are normal.  Able to name objects and repeat without trouble.  Fund of knowledge is appropriate Cranial nerves: There is good facial symmetry. The pupils are equal round and reactive to light bilaterally. Fundoscopic exam reveals clear disc margins bilaterally. Extraocular muscles are intact and visual fields are full to confrontational testing. Speech is fluent and clear. Soft palate rises symmetrically and there is no tongue deviation. Hearing is intact to conversational tone. Tone: Tone is good throughout. Coordination:  The patient has no dysdiadichokinesia or dysmetria. Motor: Strength  is 5/5 in the bilateral upper and lower extremities.  Shoulder shrug is equal bilaterally.  There is no pronator drift.  There are no fasciculations noted. Gait and Station: The patient is able to ambulate without difficulty.   MOVEMENT EXAM: Tremor:  There is minor postural tremor on the right.  Archimedes spirals improved.    chin tremor is present.     LABS  Lab Results  Component Value Date   [id number] 05/16/2012        Assessment/Plan   1.  Essential Tremor  -This is somewhat atypical in that the chin tremor is most prominent.  However, given the fact that he has a family history of tremor and it has been present in him for over 30 years, this likely represents a form of essential tremor, and not just benign chin tremor.  -He has dropped the primidone from 100 mg bid to 100 in the AM and continues to have EDS.  Will split to 50 mg bid and if no help, call me and we will get a sleep c/s.  Don't think that small dose klonopin for sleep contributing. Clonazepam has worked well and he will continue with half tablet at night. Risks, benefits, side effects and alternative therapies were discussed.  The opportunity to ask questions was given and they were answered to the best of my ability.  The patient expressed understanding and willingness to follow the outlined treatment protocols. 2.  Peripheral neuropathy, idiopathic.  -His  labs were unremarkable.  I did talk to him about safety, especially since he climbs up and down ladders.  I would like to see him not to do that. 3. obstructive sleep apnea syndrome, overall moderate.  -continue with CPAP 4.  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  Talked about staying off of elbows.  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  Not interested in considering transposition surgery 5.  Nocturnal leg cramping  -try tonic water 6.  F/u in January at his previously scheduled appointment.

## 2014-03-23 ENCOUNTER — Telehealth: Payer: Self-pay | Admitting: Neurology

## 2014-03-23 DIAGNOSIS — G4719 Other hypersomnia: Secondary | ICD-10-CM

## 2014-03-23 NOTE — Telephone Encounter (Signed)
Spoke with patient. He states sleepiness no better and tremor seems to be worse. Please advise.

## 2014-03-23 NOTE — Telephone Encounter (Signed)
Called patient to see if still sleepy after switching Primidone to 50 mg in the morning 50 mg at night. LMOM for patient to call back.

## 2014-03-23 NOTE — Telephone Encounter (Signed)
Based on my note, looks like we were going to get sleep c/s if not better.

## 2014-03-24 NOTE — Telephone Encounter (Signed)
Spoke with patient. He admitted that he has not worn his CPAP for the last couple of weeks due to being sick. He would still like the sleep consult. Encouraged to use CPAP every night and aware that we will place referral and they will contact him with appt.

## 2014-03-25 ENCOUNTER — Other Ambulatory Visit: Payer: Self-pay | Admitting: Physician Assistant

## 2014-03-25 DIAGNOSIS — D485 Neoplasm of uncertain behavior of skin: Secondary | ICD-10-CM | POA: Diagnosis not present

## 2014-03-25 DIAGNOSIS — D225 Melanocytic nevi of trunk: Secondary | ICD-10-CM | POA: Diagnosis not present

## 2014-04-22 ENCOUNTER — Other Ambulatory Visit: Payer: Self-pay | Admitting: Physician Assistant

## 2014-04-22 DIAGNOSIS — D225 Melanocytic nevi of trunk: Secondary | ICD-10-CM | POA: Diagnosis not present

## 2014-04-22 DIAGNOSIS — D485 Neoplasm of uncertain behavior of skin: Secondary | ICD-10-CM | POA: Diagnosis not present

## 2014-04-29 ENCOUNTER — Ambulatory Visit: Payer: Medicare Other | Admitting: Neurology

## 2014-05-04 ENCOUNTER — Other Ambulatory Visit: Payer: Self-pay | Admitting: Neurology

## 2014-05-04 MED ORDER — CLONAZEPAM 0.5 MG PO TABS: 0.5000 mg | ORAL_TABLET | Freq: Every day | ORAL | Status: DC

## 2014-05-04 NOTE — Telephone Encounter (Signed)
Clonazepam refill requested. Per last office note- patient to remain on medication. Refill approved and sent to patient's pharmacy.   

## 2014-05-10 ENCOUNTER — Institutional Professional Consult (permissible substitution): Payer: Medicare Other | Admitting: Pulmonary Disease

## 2014-05-13 DIAGNOSIS — Z029 Encounter for administrative examinations, unspecified: Secondary | ICD-10-CM | POA: Diagnosis not present

## 2014-06-14 ENCOUNTER — Institutional Professional Consult (permissible substitution): Payer: Medicare Other | Admitting: Pulmonary Disease

## 2014-06-28 ENCOUNTER — Encounter: Payer: Self-pay | Admitting: Neurology

## 2014-06-28 ENCOUNTER — Telehealth: Payer: Self-pay | Admitting: Neurology

## 2014-06-28 ENCOUNTER — Ambulatory Visit (INDEPENDENT_AMBULATORY_CARE_PROVIDER_SITE_OTHER): Payer: Medicare Other | Admitting: Neurology

## 2014-06-28 VITALS — BP 130/72 | HR 68 | Ht 69.0 in | Wt 182.0 lb

## 2014-06-28 DIAGNOSIS — G4733 Obstructive sleep apnea (adult) (pediatric): Secondary | ICD-10-CM

## 2014-06-28 DIAGNOSIS — G4762 Sleep related leg cramps: Secondary | ICD-10-CM

## 2014-06-28 DIAGNOSIS — G250 Essential tremor: Secondary | ICD-10-CM | POA: Diagnosis not present

## 2014-06-28 DIAGNOSIS — G5621 Lesion of ulnar nerve, right upper limb: Secondary | ICD-10-CM

## 2014-06-28 NOTE — Telephone Encounter (Signed)
Tell patient that I got his labs from his PCP done in July, 15, 2015.  His B12 was low at 255 (low end normal) and I would like to recheck and I don't see where his TSH has been checked.  That is something we should do since he has been so tired.  Please order.  Dx:  Fatigue, B12 deficiency

## 2014-06-28 NOTE — Progress Notes (Signed)
Subjective:   Stephen Anderson was seen in consultation in the movement disorder clinic.  The evaluation is for tremor.  His PCP is [name],[name], MD.   The patient is a 74 y.o. right handed male with a history of tremor.Onset of symptoms was 30+ years ago.   Tremor primarily involves the bilateral hand and chin.   Since he quit smoking 4 years ago, his hand tremor went away.   Tremor exacerbated by pressure on the chin.   He is not biting himself because of the tremor.   It only bothers him a little but it bothers his wife.  There is a family hx of tremor in his mother.  He is on primidone and has been on it for 30 years.  He is on 50 mg, 2 tablets twice per day.  He thinks that it helps.  He reports arm/hand and leg paresthesias for years.  He has definite paresthesias from the knee down.  He is on neurontin for this.  He has some loss of balance.  He does not fall on the level ground.  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    12/01/12 update:  I have not seen the pt since Feb.  He is on primidone 100 mg bid and lat visit klonopin was added.  He is only taking 1/2 tablet at night and it works well.  He has had some sleepiness, but does not think that it is related to the Klonopin.  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  He thinks that it may just be because he works hard.  He is up on ladders.  He would like to try and get down on some of his medications.  10/29/13 update:  The patient returns today for followup.  It has been approximately one year since I have seen the patient.  He has a history of essential tremor.  Last visit, I did decrease his morning dosage of primidone because of sleepiness but he didn't do that and is on 100 mg bid (is supposed to be on 100 q hs).  He is also on clonazepam 0.5 mg, half a tablet at night, that he primarily uses for sleep.  He called me recently for refill of that, but he was told that he needed to followup  before that was refilled.  The patient reports overall he has done well over the last year.  Symptoms have been fairly stable.  No falls.  No hallucinations.   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  He goes to bed b/w 9-10 pm and awakens one time to use the restroom but has trouble falling back asleep. He will watch TV to get back to sleep for 30 mins.  He snores.  He wakes himself up with the snorting.  He gets up about 6:30 AM to an alarm clock but even without it, he doesn't sleep in.  He feels refreshed until he eats breakfast and then he feels like he could fall back asleep but he works as a Curator still.  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  He has to keep a cup of crushed ice near him while driving and that keeps him awake.  He doesn't fall asleep while eating.    He had labs since last visit for PN.  His B12 was 506, folate 13.8, RPR was negative and there was no monoclonal proteins in the serum or urinary protein electrophoresis.    He did have diverticulitis since our last visit (3 weeks ago).  He is off antibiotics.  He is not completely back to normal.  12/30/13 update:  The patient is returning today for followup.  He is on primidone 100 mg twice a day for his essential tremor.  States that tremor is well controlled.  Is on 1/2 tablet of 0.5 mg clonazepam for insomnia.  However, the patient was complaining of excessive daytime hypersomnolence.  Therefore, last visit I sent him for a nocturnal polysomnogram.  His apnea hypoxia index was 21 with an O2 nadir of 84%.  CPAP was recommended at 8.  The patient did not want to proceed with that, so he presents today to talk further about that.  Pt states that the night that he did the cpap trial he had awful headache the following day and for 4 days after.  Worried about trying cpap because of that.  02/25/14 update: Pt returns for f/u.  Wearing CPAP  faithfully (I reviewed compliance report) and despite that, continues to have EDS.  CPAP causes dry mouth.  He is on primidone, 100mg  in the AM but he stopped the 100 at night.  Still sleepy.  Takes 1/2 tablet of klonopin at night.  If sits during the daytime, will fall asleep.  Has to carry a cup of ice in order to drive so doesn't fall asleep and as long as he has that, can stay awake.  Also c/o leg cramping at night.  Also c/o R pinky and ring paresthesias, especially with painting and resting arm on a chair.  06/28/14 update:  I tried to split up the patient's primidone to 50 mg twice a day instead of 100 mg all at once to see if his excessive daytime hypersomnolence would be better, but it really was not.  Hand tremor is under good control but he has significant chin tremor that never seems to change.  Therefore, I referred him for a sleep consult.  This got moved back and it is now scheduled for April 13.  He does have obstructive sleep apnea syndrome and is wearing his CPAP faithfully.  He thinks that it helps him sleep at night but he still can sleep all day long.  He is on a very small dosage of clonazepam at night of 0.5 mg, half a tablet at night.  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    Current/Previously tried tremor medications: primidone  Current medications that may exacerbate tremor:  n/a   Allergies  Allergen Reactions  . Oxycontin [Oxycodone Hcl] Hives    Current Outpatient Prescriptions on File Prior to Visit  Medication Sig Dispense Refill  . aspirin 81 MG tablet Take 81 mg by mouth daily.    Marland Kitchen atorvastatin (LIPITOR) 40 MG tablet Take 40 mg by mouth daily.    Marland Kitchen CHERRY PO Take by mouth daily.    . cholecalciferol (VITAMIN D) 1000 UNITS tablet Take 1,000 Units by mouth daily.    . clonazePAM (KLONOPIN) 0.5 MG tablet Take 1 tablet (0.5 mg total) by mouth at bedtime. Take 1/2 tablet by mouth every night 30 tablet 5  . hydrocortisone cream 1 % 1 application as directed.    Marland Kitchen  omeprazole (PRILOSEC) 40 MG capsule Take 40 mg by mouth daily.     . primidone (MYSOLINE) 50 MG tablet Take  2 tablets (100 mg total) by mouth at bedtime. (Patient taking differently: Take 50 mg by mouth 2 (two) times daily. ) 60 tablet 5  . vitamin B-12 (CYANOCOBALAMIN) 100 MCG tablet Take 100 mcg by mouth daily.     No current facility-administered medications on file prior to visit.    Past Medical History  Diagnosis Date  . Hypercholesteremia   . Reflux   . Essential tremor     Past Surgical History  Procedure Laterality Date  . Gallbladder surgery    . Hernia repair      bilateral  . Cataract extraction, bilateral      History   Social History  . Marital Status: Married    Spouse Name: N/A  . Number of Children: N/A  . Years of Education: N/A   Occupational History  . retired     Nurse, mental health order; painting   Social History Main Topics  . Smoking status: Former Smoker    Quit date: 04/06/2008  . Smokeless tobacco: Former Systems developer    Quit date: 05/16/2002     Comment: prior hx of tobacco 55 years  . Alcohol Use: No  . Drug Use: No  . Sexual Activity: Not on file   Other Topics Concern  . Not on file   Social History Narrative    Family Status  Relation Status Death Age  . Mother Alive     tremor  . Father Alive     healthy  . Brother Alive     2, MI  . Sister Deceased 26 months old    unknown  . Child Alive     2, healthy    Review of Systems A complete 10 system ROS was obtained and was negative apart from what is mentioned.   Objective:   VITALS:   Filed Vitals:   06/28/14 0749  BP: 130/72  Pulse: 68  Height: 5\' 9"  (1.753 m)  Weight: 182 lb (82.555 kg)   Gen:  Appears stated age and in NAD. HEENT:  Normocephalic, atraumatic. The mucous membranes are moist. The superficial temporal arteries are without ropiness or tenderness. Cardiovascular: Regular rate and rhythm. Lungs: Clear to auscultation bilaterally. Neck: There are no carotid  bruits noted bilaterally.  NEUROLOGICAL:  Orientation:  The patient is alert and oriented x 3.  Recent and remote memory are intact.  Attention span and concentration are normal.  Able to name objects and repeat without trouble.  Fund of knowledge is appropriate Cranial nerves: There is good facial symmetry. The pupils are equal round and reactive to light bilaterally. Fundoscopic exam reveals clear disc margins bilaterally. Extraocular muscles are intact and visual fields are full to confrontational testing. Speech is fluent and clear. Soft palate rises symmetrically and there is no tongue deviation. Hearing is intact to conversational tone. Tone: Tone is good throughout. Coordination:  The patient has no dysdiadichokinesia or dysmetria. Motor: Strength is 5/5 in the bilateral upper and lower extremities.  Shoulder shrug is equal bilaterally.  There is no pronator drift.  There are no fasciculations noted. Gait and Station: The patient is able to ambulate without difficulty.   MOVEMENT EXAM: Tremor:  There is no significant hand tremor. chin tremor is present.     LABS  Lab Results  Component Value Date   [id number] 05/16/2012        Assessment/Plan   1.  Essential Tremor  -This is somewhat atypical in that the chin tremor is most prominent.  However,  given the fact that he has a family history of tremor and it has been present in him for over 30 years, this likely represents a form of essential tremor, and not just benign chin tremor.  Explained to the patient that chin tremor is very resistant to treatment and likely will never go away, despite the fact that hand tremor has been well treated.  Will stay on primidone 50 mg bid.    -will try to get TSH, CBC, CMP from PCP 2.  Peripheral neuropathy, idiopathic.  -His labs were unremarkable.  I did talk to him about safety, especially since he climbs up and down ladders.  I would like to see him not to do that. 3. obstructive sleep  apnea syndrome, overall moderate.  -continue with CPAP.  Sleep consult pending given continued EDS despite faithful CPAP use 4.  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  Talked about staying off of elbows.  Likely made worse by painting but notices it when sitting in chair and leaning on the elbows.   5.  Nocturnal leg cramping  -try tonic water.  Talked about this last visit as well 6.  F/u in 6 months

## 2014-06-29 NOTE — Telephone Encounter (Signed)
Patient made aware of labs needed. He would like to have drawn at his PCP office. I contacted office and Dr Addison Lank had already ordered these labs for patient to have drawn in January. He was made aware to stop by their office any time to have drawn.

## 2014-06-29 NOTE — Telephone Encounter (Signed)
Pt is returning your call please call on him on the cell phone number [phone]

## 2014-06-29 NOTE — Telephone Encounter (Signed)
Left message on machine for patient to call back.

## 2014-06-30 DIAGNOSIS — Z79899 Other long term (current) drug therapy: Secondary | ICD-10-CM | POA: Diagnosis not present

## 2014-06-30 DIAGNOSIS — G609 Hereditary and idiopathic neuropathy, unspecified: Secondary | ICD-10-CM | POA: Diagnosis not present

## 2014-06-30 DIAGNOSIS — E782 Mixed hyperlipidemia: Secondary | ICD-10-CM | POA: Diagnosis not present

## 2014-06-30 DIAGNOSIS — Z86010 Personal history of colonic polyps: Secondary | ICD-10-CM | POA: Diagnosis not present

## 2014-06-30 DIAGNOSIS — N529 Male erectile dysfunction, unspecified: Secondary | ICD-10-CM | POA: Diagnosis not present

## 2014-06-30 DIAGNOSIS — K591 Functional diarrhea: Secondary | ICD-10-CM | POA: Diagnosis not present

## 2014-06-30 DIAGNOSIS — E559 Vitamin D deficiency, unspecified: Secondary | ICD-10-CM | POA: Diagnosis not present

## 2014-06-30 DIAGNOSIS — Z125 Encounter for screening for malignant neoplasm of prostate: Secondary | ICD-10-CM | POA: Diagnosis not present

## 2014-06-30 DIAGNOSIS — K219 Gastro-esophageal reflux disease without esophagitis: Secondary | ICD-10-CM | POA: Diagnosis not present

## 2014-06-30 DIAGNOSIS — E538 Deficiency of other specified B group vitamins: Secondary | ICD-10-CM | POA: Diagnosis not present

## 2014-06-30 DIAGNOSIS — Z87891 Personal history of nicotine dependence: Secondary | ICD-10-CM | POA: Diagnosis not present

## 2014-07-06 ENCOUNTER — Other Ambulatory Visit: Payer: Self-pay | Admitting: Neurology

## 2014-07-06 MED ORDER — PRIMIDONE 50 MG PO TABS: 50.0000 mg | ORAL_TABLET | Freq: Two times a day (BID) | ORAL | Status: DC

## 2014-07-06 NOTE — Telephone Encounter (Signed)
Primidone refill requested. Per last office note- patient to remain on medication. Refill approved and sent to patient's pharmacy.   

## 2014-07-21 ENCOUNTER — Ambulatory Visit (INDEPENDENT_AMBULATORY_CARE_PROVIDER_SITE_OTHER): Payer: Medicare Other | Admitting: Pulmonary Disease

## 2014-07-21 ENCOUNTER — Encounter: Payer: Self-pay | Admitting: Pulmonary Disease

## 2014-07-21 VITALS — BP 132/70 | HR 76 | Temp 97.4°F | Ht 70.0 in | Wt 180.8 lb

## 2014-07-21 DIAGNOSIS — G4733 Obstructive sleep apnea (adult) (pediatric): Secondary | ICD-10-CM | POA: Diagnosis not present

## 2014-07-21 NOTE — Patient Instructions (Signed)
Will have your pressure changed to auto with a range of 5-15 Will arrange for mask fitting at the sleep center for a better fit. Will have your home care company show you how to use the heater on your humidifier, and make sure you have climate control tubing.  This will help with dryness You have to make sure that try to go back to sleep with your cpap after getting up to go to bathroom.  If you cannot within 59min, go to family room to watch tv as you are doing.  However, you must go back to bed and put on cpap when you start getting sleepy again.  Do NOT fall asleep on sofa or chair in family room  followup again in 6 weeks.

## 2014-07-21 NOTE — Assessment & Plan Note (Signed)
The patient has been diagnosed with moderate obstructive sleep apnea, and has been wearing CPAP compliantly each night.  However, his download shows significant mask leak, which then is leading to breakthrough apnea. The patient also has poor sleep hygiene, where he goes into the family room every night to watch television and falls asleep for 2-4 hours without his CPAP before starting his day. He then has severe daytime sleepiness with inactivity.  At this point, I would like to try and get his AHI under control. Will arrange for a fitting session at the sleep Center, and will also put his device on the auto setting with a limited pressure range. Since he is complaining of dryness, I will have his home care company show him how to use his heated humidifier, and also make sure that he has climate control tubing. Finally, I have discussed sleep hygiene with the patient, and explained that he needs to leave his family room and go back to his bed room to put on C Pap as soon as he starts getting sleepy.  He cannot continue to sleep in his family room on the sofa or in the chair for hours each night.

## 2014-07-21 NOTE — Progress Notes (Signed)
Subjective:    Patient ID: Stephen Anderson, male    DOB: 09/29/1940, 74 y.o.   MRN: [id number]  HPI The patient is a 74 year old male who I've been asked to see for obstructive sleep apnea and persistent daytime sleepiness. The patient tells me that he has had sleepiness issues for about 20 years, and didn't have this problem during his teenage years or young adulthood. He has recently been diagnosed with moderate obstructive sleep apnea in August of last year, with an AHI of 21 events per hour. He underwent a titration study where his optimal pressure was found to be 8 cm of water.  He has been wearing C Pap compliantly at night for about 4-5 hours, but his recent download shows significant mask leak, and also breakthrough obstructive events. He tells me that he typically awakens every night around 2 AM to go to the bathroom, and is unable to get back to sleep. He typically will go to his family room to watch television, and will fall asleep on the sofa or in the chair for the remainder of the night. He is not wearing C Pap during this time.  He then starts his day, but has significant inappropriate daytime sleepiness with any period of inactivity. He will also get sleepy driving. The patient's Epworth score today is 24. He tells me one of the issues with C Pap is related to dry mouth, but he is unsure how to operate his heated humidifier.   Sleep Questionnaire What time do you typically go to bed?( Between what hours) 10p 10p at 0102 on 07/21/14 by Inge Rise, CMA How long does it take you to fall asleep? 10-15 min 10-15 min at 1514 on 07/21/14 by Inge Rise, CMA How many times during the night do you wake up? 1 1 at 1514 on 07/21/14 by Inge Rise, [place] What time do you get out of bed to start your day? 0700 0700 at 1514 on 07/21/14 by Inge Rise, CMA Do you drive or operate heavy machinery in your occupation? No No at 1514 on 07/21/14 by Inge Rise, CMA How much has your  weight changed (up or down) over the past two years? (In pounds) 2 lb (0.907 kg) 2 lb (0.907 kg) at 1514 on 07/21/14 by Inge Rise, CMA Have you ever had a sleep study before? Yes Yes at 1514 on 07/21/14 by Inge Rise, CMA If yes, [facility] at 1514 on 07/21/14 by Inge Rise, CMA If yes, date of study? 2015 2015 at 1514 on 07/21/14 by Inge Rise, CMA Do you currently use CPAP? Yes Yes at 1514 on 07/21/14 by Inge Rise, CMA If so, what pressure? 8 8 at 1514 on 07/21/14 by Inge Rise, CMA Do you wear oxygen at any time? No   Review of Systems  Constitutional: Negative for fever and unexpected weight change.  HENT: Negative for congestion, dental problem, ear pain, nosebleeds, postnasal drip, rhinorrhea, sinus pressure, sneezing, sore throat and trouble swallowing.   Eyes: Negative for redness and itching.  Respiratory: Positive for shortness of breath. Negative for cough, chest tightness and wheezing.   Cardiovascular: Negative for palpitations and leg swelling.  Gastrointestinal: Negative for nausea and vomiting.  Genitourinary: Negative for dysuria.  Musculoskeletal: Positive for arthralgias. Negative for joint swelling.  Skin: Negative for rash.  Neurological: Negative for headaches.  Hematological: Does not bruise/bleed easily.  Psychiatric/Behavioral: Negative for dysphoric mood.  The patient is not nervous/anxious.        Objective:   Physical Exam Constitutional:  Well developed, no acute distress  HENT:  Nares patent without discharge, mild septal deviation to the left with narrowing  Oropharynx without exudate, palate and uvula are moderately elongated.  Eyes:  Perrla, eomi, no scleral icterus  Neck:  No JVD, no TMG  Cardiovascular:  Normal rate, regular rhythm, no rubs or gallops.  No murmurs        Intact distal pulses  Pulmonary :  Normal breath sounds, no stridor or respiratory distress   No rales, rhonchi, or  wheezing  Abdominal:  Soft, nondistended, bowel sounds present.  No tenderness noted.   Musculoskeletal:  No lower extremity edema noted.  Lymph Nodes:  No cervical lymphadenopathy noted  Skin:  No cyanosis noted  Neurologic:  Alert, appropriate, moves all 4 extremities without obvious deficit.         Assessment & Plan:

## 2014-07-28 ENCOUNTER — Ambulatory Visit (HOSPITAL_BASED_OUTPATIENT_CLINIC_OR_DEPARTMENT_OTHER): Payer: Medicare Other | Admitting: Radiology

## 2014-07-29 ENCOUNTER — Telehealth: Payer: Self-pay | Admitting: Pulmonary Disease

## 2014-07-29 DIAGNOSIS — G4733 Obstructive sleep apnea (adult) (pediatric): Secondary | ICD-10-CM

## 2014-07-29 NOTE — Telephone Encounter (Signed)
Called and spoke to [place], [facility]. Order placed on 07/21/14 to [facility] to set pt's machine on auto 5-15cm, Melissa stated the pt's machine cannot be set on auto but if this order is a CPAP titration then they can loan the pt a CPAP that can be placed on auto to see what pressure the pt's machine should be set at.   Leroy please advise if to change pressure or if this order needs to be a CPAP titration.

## 2014-07-30 NOTE — Telephone Encounter (Signed)
He will need a loaner auto device for 2 weeks set on 5-15cm.  Download needs to be sent to me Cannot do the titration until he has his mask fitting at the sleep center. DME needs to work out timing with patient

## 2014-07-30 NOTE — Telephone Encounter (Signed)
Called and spoke to [place]. Informed her of the recs per [facility]. Melissa verbalized understanding.

## 2014-08-11 ENCOUNTER — Other Ambulatory Visit: Payer: Self-pay | Admitting: Physician Assistant

## 2014-08-11 DIAGNOSIS — D224 Melanocytic nevi of scalp and neck: Secondary | ICD-10-CM | POA: Diagnosis not present

## 2014-08-11 DIAGNOSIS — L570 Actinic keratosis: Secondary | ICD-10-CM | POA: Diagnosis not present

## 2014-08-11 DIAGNOSIS — D485 Neoplasm of uncertain behavior of skin: Secondary | ICD-10-CM | POA: Diagnosis not present

## 2014-09-02 ENCOUNTER — Ambulatory Visit: Payer: Medicare Other | Admitting: Pulmonary Disease

## 2014-09-08 ENCOUNTER — Ambulatory Visit: Payer: Medicare Other | Admitting: Pulmonary Disease

## 2014-09-09 ENCOUNTER — Ambulatory Visit (INDEPENDENT_AMBULATORY_CARE_PROVIDER_SITE_OTHER): Payer: Medicare Other | Admitting: Pulmonary Disease

## 2014-09-09 ENCOUNTER — Encounter: Payer: Self-pay | Admitting: Pulmonary Disease

## 2014-09-09 VITALS — BP 110/66 | HR 67 | Temp 97.1°F | Ht 69.5 in | Wt 176.4 lb

## 2014-09-09 DIAGNOSIS — G4733 Obstructive sleep apnea (adult) (pediatric): Secondary | ICD-10-CM | POA: Diagnosis not present

## 2014-09-09 DIAGNOSIS — Z029 Encounter for administrative examinations, unspecified: Secondary | ICD-10-CM | POA: Diagnosis not present

## 2014-09-09 NOTE — Assessment & Plan Note (Signed)
The patient continues to have daytime sleepiness, and a download from his current device today shows that he is having breakthrough apnea with an AHI of 17 events per hour on a C Pap pressure of 8 cm of water. He was never contacted by his home care company to do an auto titration, despite them saying they would. At least she has gotten a good fitting mask from the sleep Center, and will arrange for an auto titration at home to find his optimal pressure. If his current DME company is unable to do this, we will change him to a different company for better service and care. I have also encouraged him to work aggressively on weight loss.

## 2014-09-09 NOTE — Patient Instructions (Signed)
Will find out if your machine has auto, and if not, will get you a loaner auto device to do a titration study at home.  We may have to change home care companies if your current one cannot provide adequate service. Will get your current machine set on optimal pressure once this is determined. Work on weight loss followup with Dr. Halford Chessman in 75mos after being on adequate pressure.

## 2014-09-09 NOTE — Progress Notes (Signed)
   Subjective:    Patient ID: Stephen Anderson, male    DOB: [birth year], 74 y.o.   MRN: [id number]  HPI The patient comes in today for follow-up of his obstructive sleep apnea. At the last visit, he was having persistent daytime sleepiness despite wearing C Pap compliantly 8 cm of water. We had sent in order to his home care company to do an auto titration or see if his machine was set on auto. They told him they did not have a loaner, and would contact him when 1 was available. Now here we are 2 months later, and the patient continues to have sleepiness has not had his auto titration study done. He has gone by the sleep Center and gotten a mass that appears to be fitting very well by his download. He has 100% compliance from his download today.   Review of Systems  Constitutional: Negative for fever and unexpected weight change.  HENT: Negative for congestion, dental problem, ear pain, nosebleeds, postnasal drip, rhinorrhea, sinus pressure, sneezing, sore throat and trouble swallowing.   Eyes: Negative for redness and itching.  Respiratory: Negative for cough, chest tightness, shortness of breath and wheezing.   Cardiovascular: Negative for palpitations and leg swelling.  Gastrointestinal: Negative for nausea and vomiting.  Genitourinary: Negative for dysuria.  Musculoskeletal: Negative for joint swelling.  Skin: Negative for rash.  Neurological: Negative for headaches.  Hematological: Does not bruise/bleed easily.  Psychiatric/Behavioral: Negative for dysphoric mood. The patient is not nervous/anxious.        Objective:   Physical Exam Overweight male in no acute distress Nose without purulence or discharge noted Neck without lymphadenopathy or thyromegaly No skin breakdown or pressure necrosis from the C Pap mask Lower extremities without edema, no cyanosis Alert and oriented, moves all 4 extremities.       Assessment & Plan:

## 2014-11-03 DIAGNOSIS — R1310 Dysphagia, unspecified: Secondary | ICD-10-CM | POA: Diagnosis not present

## 2014-11-03 DIAGNOSIS — G250 Essential tremor: Secondary | ICD-10-CM | POA: Diagnosis not present

## 2014-11-03 DIAGNOSIS — E538 Deficiency of other specified B group vitamins: Secondary | ICD-10-CM | POA: Diagnosis not present

## 2014-11-03 DIAGNOSIS — G4733 Obstructive sleep apnea (adult) (pediatric): Secondary | ICD-10-CM | POA: Diagnosis not present

## 2014-11-03 DIAGNOSIS — Z125 Encounter for screening for malignant neoplasm of prostate: Secondary | ICD-10-CM | POA: Diagnosis not present

## 2014-11-03 DIAGNOSIS — G609 Hereditary and idiopathic neuropathy, unspecified: Secondary | ICD-10-CM | POA: Diagnosis not present

## 2014-11-03 DIAGNOSIS — Z0000 Encounter for general adult medical examination without abnormal findings: Secondary | ICD-10-CM | POA: Diagnosis not present

## 2014-11-03 DIAGNOSIS — E559 Vitamin D deficiency, unspecified: Secondary | ICD-10-CM | POA: Diagnosis not present

## 2014-11-03 DIAGNOSIS — E782 Mixed hyperlipidemia: Secondary | ICD-10-CM | POA: Diagnosis not present

## 2014-11-03 DIAGNOSIS — K219 Gastro-esophageal reflux disease without esophagitis: Secondary | ICD-10-CM | POA: Diagnosis not present

## 2014-12-27 ENCOUNTER — Ambulatory Visit: Payer: Medicare Other | Admitting: Pulmonary Disease

## 2014-12-29 ENCOUNTER — Ambulatory Visit: Payer: BLUE CROSS/BLUE SHIELD | Admitting: Neurology

## 2015-01-04 ENCOUNTER — Telehealth: Payer: Self-pay | Admitting: Neurology

## 2015-01-06 ENCOUNTER — Ambulatory Visit: Payer: BLUE CROSS/BLUE SHIELD | Admitting: Neurology

## 2015-01-07 ENCOUNTER — Ambulatory Visit (INDEPENDENT_AMBULATORY_CARE_PROVIDER_SITE_OTHER): Payer: Medicare Other | Admitting: Neurology

## 2015-01-07 ENCOUNTER — Encounter: Payer: Self-pay | Admitting: Neurology

## 2015-01-07 VITALS — BP 100/70 | HR 67 | Ht 69.5 in | Wt 178.0 lb

## 2015-01-07 DIAGNOSIS — G250 Essential tremor: Secondary | ICD-10-CM | POA: Diagnosis not present

## 2015-01-07 DIAGNOSIS — G4733 Obstructive sleep apnea (adult) (pediatric): Secondary | ICD-10-CM | POA: Diagnosis not present

## 2015-01-07 DIAGNOSIS — D239 Other benign neoplasm of skin, unspecified: Secondary | ICD-10-CM | POA: Diagnosis not present

## 2015-01-07 NOTE — Progress Notes (Signed)
Subjective:   Stephen Anderson was seen in consultation in the movement disorder clinic.  The evaluation is for tremor.  His PCP is [name],[name], MD.   The patient is a 74 y.o. right handed male with a history of tremor.Onset of symptoms was 30+ years ago.   Tremor primarily involves the bilateral hand and chin.   Since he quit smoking 4 years ago, his hand tremor went away.   Tremor exacerbated by pressure on the chin.   He is not biting himself because of the tremor.   It only bothers him a little but it bothers his wife.  There is a family hx of tremor in his mother.  He is on primidone and has been on it for 30 years.  He is on 50 mg, 2 tablets twice per day.  He thinks that it helps.  He reports arm/hand and leg paresthesias for years.  He has definite paresthesias from the knee down.  He is on neurontin for this.  He has some loss of balance.  He does not fall on the level ground.  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    12/01/12 update:  I have not seen the pt since Feb.  He is on primidone 100 mg bid and lat visit klonopin was added.  He is only taking 1/2 tablet at night and it works well.  He has had some sleepiness, but does not think that it is related to the Klonopin.  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  He thinks that it may just be because he works hard.  He is up on ladders.  He would like to try and get down on some of his medications.  10/29/13 update:  The patient returns today for followup.  It has been approximately one year since I have seen the patient.  He has a history of essential tremor.  Last visit, I did decrease his morning dosage of primidone because of sleepiness but he didn't do that and is on 100 mg bid (is supposed to be on 100 q hs).  He is also on clonazepam 0.5 mg, half a tablet at night, that he primarily uses for sleep.  He called me recently for refill of that, but he was told that he needed to followup  before that was refilled.  The patient reports overall he has done well over the last year.  Symptoms have been fairly stable.  No falls.  No hallucinations.   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  He goes to bed b/w 9-10 pm and awakens one time to use the restroom but has trouble falling back asleep. He will watch TV to get back to sleep for 30 mins.  He snores.  He wakes himself up with the snorting.  He gets up about 6:30 AM to an alarm clock but even without it, he doesn't sleep in.  He feels refreshed until he eats breakfast and then he feels like he could fall back asleep but he works as a Curator still.  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  He has to keep a cup of crushed ice near him while driving and that keeps him awake.  He doesn't fall asleep while eating.    He had labs since last visit for PN.  His B12 was 506, folate 13.8, RPR was negative and there was no monoclonal proteins in the serum or urinary protein electrophoresis.    He did have diverticulitis since our last visit (3 weeks ago).  He is off antibiotics.  He is not completely back to normal.  12/30/13 update:  The patient is returning today for followup.  He is on primidone 100 mg twice a day for his essential tremor.  States that tremor is well controlled.  Is on 1/2 tablet of 0.5 mg clonazepam for insomnia.  However, the patient was complaining of excessive daytime hypersomnolence.  Therefore, last visit I sent him for a nocturnal polysomnogram.  His apnea hypoxia index was 21 with an O2 nadir of 84%.  CPAP was recommended at 8.  The patient did not want to proceed with that, so he presents today to talk further about that.  Pt states that the night that he did the cpap trial he had awful headache the following day and for 4 days after.  Worried about trying cpap because of that.  02/25/14 update: Pt returns for f/u.  Wearing CPAP  faithfully (I reviewed compliance report) and despite that, continues to have EDS.  CPAP causes dry mouth.  He is on primidone, 100mg  in the AM but he stopped the 100 at night.  Still sleepy.  Takes 1/2 tablet of klonopin at night.  If sits during the daytime, will fall asleep.  Has to carry a cup of ice in order to drive so doesn't fall asleep and as long as he has that, can stay awake.  Also c/o leg cramping at night.  Also c/o R pinky and ring paresthesias, especially with painting and resting arm on a chair.  06/28/14 update:  I tried to split up the patient's primidone to 50 mg twice a day instead of 100 mg all at once to see if his excessive daytime hypersomnolence would be better, but it really was not.  Hand tremor is under good control but he has significant chin tremor that never seems to change.  Therefore, I referred him for a sleep consult.  This got moved back and it is now scheduled for April 13.  He does have obstructive sleep apnea syndrome and is wearing his CPAP faithfully.  He thinks that it helps him sleep at night but he still can sleep all day long.  He is on a very small dosage of clonazepam at night of 0.5 mg, half a tablet at night.  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    01/07/15 update:  The patient has a history of essential tremor and is on primidone, 50 mg twice a day.  He states that his tremor is doing is doing great.  He is taking 1/2 tablet of the klonopin at night.  He also has a history of obstructive sleep apnea syndrome and despite treatment has complained for quite some time of daytime hypersomnolence.  I sent him to see Dr. Gwenette Greet, and I reviewed his records.  Dr. Gwenette Greet noted that the patient had significant mask leak.  The patient changed masks and despite that his downloads showed that his apnea was not well treated.  He was supposed to be set up for AutoPap, but the company initially did not contact him to do that.  Then, they did contact him but he went on vacation  for 22 days.   The patient did have a follow-up appointment with Dr. Elsworth Soho on September but he had to  cancel his appt and he couldn't get in until Jan.  He does state that he is doing better with his new CPAP machine and he can tell a big difference.  His dad died since our last visit.  Current/Previously tried tremor medications: primidone  Current medications that may exacerbate tremor:  n/a   Allergies  Allergen Reactions  . Oxycontin [Oxycodone Hcl] Hives    Current Outpatient Prescriptions on File Prior to Visit  Medication Sig Dispense Refill  . aspirin 81 MG tablet Take 81 mg by mouth daily.    Marland Kitchen atorvastatin (LIPITOR) 40 MG tablet Take 40 mg by mouth daily.    Marland Kitchen CHERRY PO Take by mouth daily.    . cholecalciferol (VITAMIN D) 1000 UNITS tablet Take 1,000 Units by mouth daily.    . clonazePAM (KLONOPIN) 0.5 MG tablet Take 1 tablet (0.5 mg total) by mouth at bedtime. Take 1/2 tablet by mouth every night 30 tablet 5  . hydrocortisone cream 1 % 1 application as directed.    Marland Kitchen omeprazole (PRILOSEC) 40 MG capsule Take 40 mg by mouth daily.     . primidone (MYSOLINE) 50 MG tablet Take 1 tablet (50 mg total) by mouth 2 (two) times daily. 60 tablet 5   No current facility-administered medications on file prior to visit.    Past Medical History  Diagnosis Date  . Hypercholesteremia   . Reflux   . Essential tremor     Past Surgical History  Procedure Laterality Date  . Gallbladder surgery    . Hernia repair      bilateral  . Cataract extraction, bilateral    . Melanoma excision      Social History   Social History  . Marital Status: Married    Spouse Name: N/A  . Number of Children: y  . Years of Education: N/A   Occupational History  . retired     Nurse, mental health order; painting   Social History Main Topics  . Smoking status: Former Smoker -- 1.00 packs/day for 50 years    Types: Cigarettes    Quit date: 04/06/2008  . Smokeless tobacco: Former Systems developer    Quit date:  05/16/2002  . Alcohol Use: No  . Drug Use: No  . Sexual Activity: Not on file   Other Topics Concern  . Not on file   Social History Narrative    Family Status  Relation Status Death Age  . Mother Alive     tremor  . Father Alive     healthy  . Brother Alive     2, MI  . Sister Deceased 81 months old    unknown  . Child Alive     2, healthy    Review of Systems A complete 10 system ROS was obtained and was negative apart from what is mentioned.   Objective:   VITALS:   Filed Vitals:   01/07/15 0908  BP: 100/70  Pulse: 67  Height: 5' 9.5" (1.765 m)  Weight: 178 lb (80.74 kg)   Gen:  Appears stated age and in NAD. HEENT:  Normocephalic, atraumatic. The mucous membranes are moist. The superficial temporal arteries are without ropiness or tenderness. Cardiovascular: Regular rate and rhythm. Lungs: Clear to auscultation bilaterally. Neck: There are no carotid bruits noted bilaterally.  NEUROLOGICAL:  Orientation:  The patient is alert and oriented x 3.  Recent and remote memory are intact.  Attention span and concentration are normal.  Able to name objects and repeat without  trouble.  Fund of knowledge is appropriate Cranial nerves: There is good facial symmetry. The pupils are equal round and reactive to light bilaterally. Fundoscopic exam reveals clear disc margins bilaterally. Extraocular muscles are intact and visual fields are full to confrontational testing. Speech is fluent and clear. Soft palate rises symmetrically and there is no tongue deviation. Hearing is intact to conversational tone. Tone: Tone is good throughout. Coordination:  The patient has no dysdiadichokinesia or dysmetria. Motor: Strength is 5/5 in the bilateral upper and lower extremities.  Shoulder shrug is equal bilaterally.  There is no pronator drift.  There are no fasciculations noted. Gait and Station: The patient is able to ambulate without difficulty.   MOVEMENT EXAM: Tremor:  There is no  significant hand tremor. chin tremor is present.     LABS  Lab Results  Component Value Date   [id number] 05/16/2012   I was able to get a copy of lab work from his primary care physician from 06/30/2014.  His B12 was still on the low side at 376, but it was improved from previous at 255.  His TSH was normal at 2.06.  His CBC and chemistry were normal.  His liver function tests were normal.     Assessment/Plan   1.  Essential Tremor  -This is somewhat atypical in that the chin tremor is most prominent.  However, given the fact that he has a family history of tremor and it has been present in him for over 30 years, this likely represents a form of essential tremor, and not just benign chin tremor.  Explained to the patient that chin tremor is very resistant to treatment and likely will never go away, despite the fact that hand tremor has been well treated.  Will stay on primidone 50 mg bid.   2.  Peripheral neuropathy, idiopathic.  -His labs were unremarkable.  I did talk to him about safety, especially since he climbs up and down ladders.  I would like to see him not to do that. 3. obstructive sleep apnea syndrome, overall moderate.  -continue with CPAP.  Doing better now that his CPAP mask is not leaking as much.  He has a f/u with sleep medicine 4.  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  Talked about staying off of elbows.   5.  Nocturnal leg cramping  -try tonic water.  Talked about this last visit as well.  Doing better than he was 6.  F/u in 6 months

## 2015-02-02 NOTE — Telephone Encounter (Signed)
error 

## 2015-02-11 ENCOUNTER — Telehealth: Payer: Self-pay | Admitting: Internal Medicine

## 2015-02-11 DIAGNOSIS — G4733 Obstructive sleep apnea (adult) (pediatric): Secondary | ICD-10-CM

## 2015-02-15 NOTE — Telephone Encounter (Signed)
Called and spoke to pt. Pt stated he had a titration study in the summer and has not heard anything from [place] about it. Called Melissa at [facility] and she stated the pt had a titration study in June and there were no recommendations made after the study. Pt a former [facility] pt. Advised Melissa to re-fax the titration study to Dr. Annamaria Boots to review. Pt has upcoming appt with CY in Jan 2017.   Will forward to [facility] to follow. Pt aware.

## 2015-02-16 NOTE — Telephone Encounter (Signed)
Message sent to [facility] advising her that we do not have a copy of a Titration study done in the Summer.  The copy we have in Epic is from March.  Asked that if patient had another study for her to fax that to [place].   Awaiting response from [facility]

## 2015-02-16 NOTE — Telephone Encounter (Signed)
Stephen Anderson, has this been received? thanks 

## 2015-02-16 NOTE — Telephone Encounter (Signed)
No forms have come across my desk; please see if they are in CY's folder at front or call [place] to refax. Thanks.

## 2015-02-22 ENCOUNTER — Other Ambulatory Visit: Payer: Self-pay | Admitting: Neurology

## 2015-02-22 MED ORDER — PRIMIDONE 50 MG PO TABS: 50.0000 mg | ORAL_TABLET | Freq: Two times a day (BID) | ORAL | Status: DC

## 2015-02-22 NOTE — Telephone Encounter (Signed)
Called and spoke to [place]. Melissa stated she faxed the titration study twice and she is coming to office today and will re-fax the study then as well.   Katie please look out for study.

## 2015-02-22 NOTE — Telephone Encounter (Signed)
Primidone refill requested. Per last office note- patient to remain on medication. Refill approved and sent to patient's pharmacy.   

## 2015-02-22 NOTE — Telephone Encounter (Signed)
Spoke with Melissa at [facility] was a former [facility] patient and pt has not seen CY as of yet. If forms have come across they may have went to [facility] since he was a [facility] patient. Lenna Sciara is going to re-fax the download to front fax machine once she gets to a desktop computer.

## 2015-02-23 NOTE — Telephone Encounter (Signed)
Stephen Anderson, have you received this fax from [place] yet?

## 2015-02-23 NOTE — Telephone Encounter (Signed)
Spoke with Lenna Sciara - this is being re-faxed to [place] attention

## 2015-02-23 NOTE — Telephone Encounter (Signed)
No fax as of yet- please call Melissa to remind her to fax report to the office. Thanks.

## 2015-02-24 NOTE — Telephone Encounter (Signed)
Spoke with Melissa at [facility] is re-faxing titration from 09-2014(ordered by [facility]) to back fax and will need CY to review and advise on pressure settings as patient told [place] that he is having trouble with pressure and sleeping well even though he is using the CPAP every night.

## 2015-02-24 NOTE — Telephone Encounter (Signed)
Patient's download received and placed on Katie's desk.

## 2015-02-24 NOTE — Telephone Encounter (Signed)
Stephen Anderson, form was re-faxed on 02/23/15. Please advise if form has been received

## 2015-02-25 NOTE — Telephone Encounter (Signed)
Pt aware that order has been placed. Nothing further needed.  

## 2015-02-25 NOTE — Telephone Encounter (Signed)
Suggest- order DME [facility]   Reduce CPAP pressure to 10     Dx OSA      Then get a new download after 2 weeks  We will see how he does and can talk about further sleep studies when i see him in January

## 2015-03-09 ENCOUNTER — Encounter: Payer: Self-pay | Admitting: Internal Medicine

## 2015-04-08 ENCOUNTER — Telehealth: Payer: Self-pay | Admitting: Neurology

## 2015-04-08 DIAGNOSIS — L219 Seborrheic dermatitis, unspecified: Secondary | ICD-10-CM | POA: Diagnosis not present

## 2015-04-08 DIAGNOSIS — L570 Actinic keratosis: Secondary | ICD-10-CM | POA: Diagnosis not present

## 2015-04-08 MED ORDER — CLONAZEPAM 0.5 MG PO TABS: 0.2500 mg | ORAL_TABLET | Freq: Every day | ORAL | Status: DC

## 2015-04-08 NOTE — Telephone Encounter (Signed)
One month refill called to [facility].

## 2015-04-08 NOTE — Telephone Encounter (Signed)
Records reviewed, on last note from 12/2014 he was noted to be taking clonazepam 0.5mg  1/2 tab qhs. Ok to refill for 1 month, then pls confirm with Dr. Carles Collet. Thanks

## 2015-04-08 NOTE — Telephone Encounter (Signed)
Patient requesting refill on Clonazepam 0.5 mg - 1/2 tablet at beftime. Not mentioned since 02/2014 note and last filled 04/2014. Please advise if okay to refill.

## 2015-04-21 ENCOUNTER — Encounter: Payer: Self-pay | Admitting: Internal Medicine

## 2015-04-21 ENCOUNTER — Ambulatory Visit (INDEPENDENT_AMBULATORY_CARE_PROVIDER_SITE_OTHER): Payer: PPO | Admitting: Internal Medicine

## 2015-04-21 VITALS — BP 114/72 | HR 70 | Ht 69.5 in | Wt 186.4 lb

## 2015-04-21 DIAGNOSIS — G4733 Obstructive sleep apnea (adult) (pediatric): Secondary | ICD-10-CM

## 2015-04-21 NOTE — Patient Instructions (Addendum)
Order- DME [facility]- continuation of CPAP order, change pressure to 10, mask of choice, humidifier, supplies, AirView                   Dx OSA  You can check your owners manual or ask [facility] how to adjust the humidifier. Try turning it up a little for dry mouth.  Please call us as needed

## 2015-04-21 NOTE — Progress Notes (Signed)
   Subjective:    Patient ID: Stephen Anderson, male    DOB: 1941-01-10, 75 y.o.   MRN: [id number]  HPI 09/09/2014- Dr Gwenette Greet The patient comes in today for follow-up of his obstructive sleep apnea. At the last visit, he was having persistent daytime sleepiness despite wearing C Pap compliantly 8 cm of water. We had sent in order to his home care company to do an auto titration or see if his machine was set on auto. They told him they did not have a loaner, and would contact him when 1 was available. Now here we are 2 months later, and the patient continues to have sleepiness has not had his auto titration study done. He has gone by the sleep Center and gotten a mass that appears to be fitting very well by his download. He has 100% compliance from his download today.  04/21/2015-75 year old male former smoker  followed for OSA complicated by peripheral neuropathy, essential tremor NPSG 11/2013  AHI 21/ hr, titrated to 8 cwp CPAP 8/[facility] Former pt. of [facility]-currently wearing 8 hrs. every hs,doing well. Unsure of pr.Feels like it is helping. Had worked with Lynnae Sandhoff on mask choice- full face Download confirms excellent compliance but residual AHI is 14.7. I don't have the data sheet indicating central versus obstructive. He feels CPAP is comfortable but still notices daytime sleepiness in warm still situations like church. Some dry mouth.  Review of Systems  Constitutional: Negative for fever and unexpected weight change.  HENT: Negative for congestion, dental problem, ear pain, nosebleeds, postnasal drip, rhinorrhea, sinus pressure, sneezing, sore throat and trouble swallowing.   Eyes: Negative for redness and itching.  Respiratory: Negative for cough, chest tightness, shortness of breath and wheezing.   Cardiovascular: Negative for palpitations and leg swelling.  Gastrointestinal: Negative for nausea and vomiting.  Genitourinary: Negative for dysuria.  Musculoskeletal: Negative for joint swelling.    Skin: Negative for rash.  Neurological: Negative for headaches.  Hematological: Does not bruise/bleed easily.  Psychiatric/Behavioral: Negative for dysphoric mood. The patient is not nervous/anxious.    Objective:  OBJ- Physical Exam General- Alert, Oriented, Affect-appropriate, Distress- none acute, trim Skin- rash-none, lesions- none, excoriation- none Lymphadenopathy- none Head- atraumatic            Eyes- Gross vision intact, PERRLA, conjunctivae and secretions clear            Ears- Hearing, canals-normal            Nose- Clear, no-Septal dev, mucus, polyps, erosion, perforation             Throat- Mallampati II , mucosa clear , drainage- none, tonsils- atrophic Neck- flexible , trachea midline, no stridor , thyroid nl, carotid no bruit Chest - symmetrical excursion , unlabored           Heart/CV- RRR , no murmur , no gallop  , no rub, nl s1 s2                           - JVD- none , edema- none, stasis changes- none, varices- none           Lung- clear to P&A, wheeze- none, cough- none , dullness-none, rub- none           Chest wall-  Abd-  Br/ Gen/ Rectal- Not done, not indicated Extrem- cyanosis- none, clubbing, none, atrophy- none, strength- nl Neuro- grossly intact to observation  Assessment & Plan:

## 2015-04-23 NOTE — Assessment & Plan Note (Addendum)
Latest download documents excellent compliance but gives residual AHI 14.7 and 8 CWP. He thought pressure was being increased to 10 at last visit and I see the order but apparently it did not get done. Plan-DME company to increase CPAP to 10

## 2015-05-04 ENCOUNTER — Encounter: Payer: Self-pay | Admitting: Internal Medicine

## 2015-06-06 ENCOUNTER — Telehealth: Payer: Self-pay | Admitting: Neurology

## 2015-06-06 NOTE — Telephone Encounter (Signed)
Okay.  Only on 1/2 tablet at night

## 2015-06-06 NOTE — Telephone Encounter (Signed)
Received refill request from [facility] for Clonazepam 0.5 mg. Please advise if okay to refill.

## 2015-06-07 MED ORDER — CLONAZEPAM 0.5 MG PO TABS: 0.2500 mg | ORAL_TABLET | Freq: Every day | ORAL | Status: DC

## 2015-06-07 NOTE — Telephone Encounter (Signed)
Refill called to [facility].

## 2015-06-16 ENCOUNTER — Other Ambulatory Visit: Payer: Self-pay | Admitting: Physician Assistant

## 2015-06-16 DIAGNOSIS — D2372 Other benign neoplasm of skin of left lower limb, including hip: Secondary | ICD-10-CM | POA: Diagnosis not present

## 2015-06-16 DIAGNOSIS — L570 Actinic keratosis: Secondary | ICD-10-CM | POA: Diagnosis not present

## 2015-06-16 DIAGNOSIS — D485 Neoplasm of uncertain behavior of skin: Secondary | ICD-10-CM | POA: Diagnosis not present

## 2015-07-01 ENCOUNTER — Other Ambulatory Visit: Payer: Self-pay | Admitting: Neurology

## 2015-07-01 MED ORDER — PRIMIDONE 50 MG PO TABS: 50.0000 mg | ORAL_TABLET | Freq: Two times a day (BID) | ORAL | Status: DC

## 2015-07-01 NOTE — Telephone Encounter (Signed)
Primidone refill requested. Per last office note- patient to remain on medication. Refill approved and sent to patient's pharmacy.   

## 2015-07-07 ENCOUNTER — Encounter: Payer: Self-pay | Admitting: Neurology

## 2015-07-07 ENCOUNTER — Ambulatory Visit (INDEPENDENT_AMBULATORY_CARE_PROVIDER_SITE_OTHER): Payer: PPO | Admitting: Neurology

## 2015-07-07 VITALS — BP 146/88 | HR 66 | Ht 69.0 in | Wt 185.0 lb

## 2015-07-07 DIAGNOSIS — G4733 Obstructive sleep apnea (adult) (pediatric): Secondary | ICD-10-CM

## 2015-07-07 DIAGNOSIS — G250 Essential tremor: Secondary | ICD-10-CM

## 2015-07-07 NOTE — Progress Notes (Signed)
Subjective:   Stephen Anderson was seen in consultation in the movement disorder clinic.  The evaluation is for tremor.  His PCP is [name],[name], MD.   The patient is a 75 y.o. right handed male with a history of tremor.Onset of symptoms was 30+ years ago.   Tremor primarily involves the bilateral hand and chin.   Since he quit smoking 4 years ago, his hand tremor went away.   Tremor exacerbated by pressure on the chin.   He is not biting himself because of the tremor.   It only bothers him a little but it bothers his wife.  There is a family hx of tremor in his mother.  He is on primidone and has been on it for 30 years.  He is on 50 mg, 2 tablets twice per day.  He thinks that it helps.  He reports arm/hand and leg paresthesias for years.  He has definite paresthesias from the knee down.  He is on neurontin for this.  He has some loss of balance.  He does not fall on the level ground.  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    12/01/12 update:  I have not seen the pt since Feb.  He is on primidone 100 mg bid and lat visit klonopin was added.  He is only taking 1/2 tablet at night and it works well.  He has had some sleepiness, but does not think that it is related to the Klonopin.  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  He thinks that it may just be because he works hard.  He is up on ladders.  He would like to try and get down on some of his medications.  10/29/13 update:  The patient returns today for followup.  It has been approximately one year since I have seen the patient.  He has a history of essential tremor.  Last visit, I did decrease his morning dosage of primidone because of sleepiness but he didn't do that and is on 100 mg bid (is supposed to be on 100 q hs).  He is also on clonazepam 0.5 mg, half a tablet at night, that he primarily uses for sleep.  He called me recently for refill of that, but he was told that he needed to followup  before that was refilled.  The patient reports overall he has done well over the last year.  Symptoms have been fairly stable.  No falls.  No hallucinations.   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  He goes to bed b/w 9-10 pm and awakens one time to use the restroom but has trouble falling back asleep. He will watch TV to get back to sleep for 30 mins.  He snores.  He wakes himself up with the snorting.  He gets up about 6:30 AM to an alarm clock but even without it, he doesn't sleep in.  He feels refreshed until he eats breakfast and then he feels like he could fall back asleep but he works as a Curator still.  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  He has to keep a cup of crushed ice near him while driving and that keeps him awake.  He doesn't fall asleep while eating.    He had labs since last visit for PN.  His B12 was 506, folate 13.8, RPR was negative and there was no monoclonal proteins in the serum or urinary protein electrophoresis.    He did have diverticulitis since our last visit (3 weeks ago).  He is off antibiotics.  He is not completely back to normal.  12/30/13 update:  The patient is returning today for followup.  He is on primidone 100 mg twice a day for his essential tremor.  States that tremor is well controlled.  Is on 1/2 tablet of 0.5 mg clonazepam for insomnia.  However, the patient was complaining of excessive daytime hypersomnolence.  Therefore, last visit I sent him for a nocturnal polysomnogram.  His apnea hypoxia index was 21 with an O2 nadir of 84%.  CPAP was recommended at 8.  The patient did not want to proceed with that, so he presents today to talk further about that.  Pt states that the night that he did the cpap trial he had awful headache the following day and for 4 days after.  Worried about trying cpap because of that.  02/25/14 update: Pt returns for f/u.  Wearing CPAP  faithfully (I reviewed compliance report) and despite that, continues to have EDS.  CPAP causes dry mouth.  He is on primidone, 100mg  in the AM but he stopped the 100 at night.  Still sleepy.  Takes 1/2 tablet of klonopin at night.  If sits during the daytime, will fall asleep.  Has to carry a cup of ice in order to drive so doesn't fall asleep and as long as he has that, can stay awake.  Also c/o leg cramping at night.  Also c/o R pinky and ring paresthesias, especially with painting and resting arm on a chair.  06/28/14 update:  I tried to split up the patient's primidone to 50 mg twice a day instead of 100 mg all at once to see if his excessive daytime hypersomnolence would be better, but it really was not.  Hand tremor is under good control but he has significant chin tremor that never seems to change.  Therefore, I referred him for a sleep consult.  This got moved back and it is now scheduled for April 13.  He does have obstructive sleep apnea syndrome and is wearing his CPAP faithfully.  He thinks that it helps him sleep at night but he still can sleep all day long.  He is on a very small dosage of clonazepam at night of 0.5 mg, half a tablet at night.  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    01/07/15 update:  The patient has a history of essential tremor and is on primidone, 50 mg twice a day.  He states that his tremor is doing is doing great.  He is taking 1/2 tablet of the klonopin at night.  He also has a history of obstructive sleep apnea syndrome and despite treatment has complained for quite some time of daytime hypersomnolence.  I sent him to see Dr. Gwenette Greet, and I reviewed his records.  Dr. Gwenette Greet noted that the patient had significant mask leak.  The patient changed masks and despite that his downloads showed that his apnea was not well treated.  He was supposed to be set up for [employer], but the company initially did not contact him to do that.  Then, they did contact him but he went on vacation  for 22 days.   The patient did have a follow-up appointment with Dr. Elsworth Soho on September but he had to  cancel his appt and he couldn't get in until Jan.  He does state that he is doing better with his new CPAP machine and he can tell a big difference.  His dad died since our last visit.  15-Jul-2015 update:  The patient returns today for follow-up.  He is on primidone, 50 mg twice a day.  He thinks that he is stable and "it is a lot better than it has been" but his wife notices it sometimes.  He also has history of obstructive sleep apnea syndrome.  He saw Dr. Annamaria Boots for the first time on 04/21/2015.  His CPAP was just increased to 10 mm of water.  He is doing better with this and is having less leak with the new mask.  He remains on clonazepam 0.5 mg, half a tablet at night for insomnia.  Still having leg cramping - did not try tonic water.  Current/Previously tried tremor medications: primidone  Current medications that may exacerbate tremor:  n/a   Allergies  Allergen Reactions  . Oxycontin [Oxycodone Hcl] Hives    Current Outpatient Prescriptions on File Prior to Visit  Medication Sig Dispense Refill  . aspirin 81 MG tablet Take 81 mg by mouth daily.    Marland Kitchen atorvastatin (LIPITOR) 40 MG tablet Take 40 mg by mouth daily.    Marland Kitchen CHERRY PO Take by mouth daily. Reported on 04/21/2015    . cholecalciferol (VITAMIN D) 1000 UNITS tablet Take 1,000 Units by mouth daily.    . clonazePAM (KLONOPIN) 0.5 MG tablet Take 0.5 tablets (0.25 mg total) by mouth at bedtime. Take 1/2 tablet by mouth every night 15 tablet 2  . hydrocortisone cream 1 % 1 application as directed.    Marland Kitchen omeprazole (PRILOSEC) 40 MG capsule Take 40 mg by mouth 2 (two) times daily.     . primidone (MYSOLINE) 50 MG tablet Take 1 tablet (50 mg total) by mouth 2 (two) times daily. 60 tablet 5  . vitamin B-12 (CYANOCOBALAMIN) 500 MCG tablet Take 500 mcg by mouth daily.     No current facility-administered medications on file prior to visit.     Past Medical History  Diagnosis Date  . Hypercholesteremia   . Reflux   . Essential tremor     Past Surgical History  Procedure Laterality Date  . Gallbladder surgery    . Hernia repair      bilateral  . Cataract extraction, bilateral    . Melanoma excision      Social History   Social History  . Marital Status: Married    Spouse Name: N/A  . Number of Children: y  . Years of Education: N/A   Occupational History  . retired     Nurse, mental health order; painting   Social History Main Topics  . Smoking status: Former Smoker -- 1.00 packs/day for 50 years    Types: Cigarettes    Quit date: 04/06/2008  . Smokeless tobacco: Former Systems developer    Quit date: 05/16/2002  . Alcohol Use: No  . Drug Use: No  . Sexual Activity: Not on file   Other Topics Concern  . Not on file   Social History Narrative    Family Status  Relation Status Death Age  . Mother Alive     tremor  . Father Deceased     complications hip fx  . Brother Alive     2, MI  . Sister Deceased 61 months old    unknown  . Child Alive  2, healthy    Review of Systems A complete 10 system ROS was obtained and was negative apart from what is mentioned.   Objective:   VITALS:   Filed Vitals:   07/07/15 0757  BP: 146/88  Pulse: 66  Height: 5\' 9"  (1.753 m)  Weight: 185 lb (83.915 kg)   Gen:  Appears stated age and in NAD. HEENT:  Normocephalic, atraumatic. The mucous membranes are moist. The superficial temporal arteries are without ropiness or tenderness. Cardiovascular: Regular rate and rhythm. Lungs: Clear to auscultation bilaterally. Neck: There are no carotid bruits noted bilaterally.  NEUROLOGICAL:  Orientation:  The patient is alert and oriented x 3.  Recent and remote memory are intact.  Attention span and concentration are normal.  Able to name objects and repeat without trouble.  Fund of knowledge is appropriate Cranial nerves: There is good facial symmetry. The pupils are equal round  and reactive to light bilaterally. Fundoscopic exam reveals clear disc margins bilaterally. Extraocular muscles are intact and visual fields are full to confrontational testing. Speech is fluent and clear. Soft palate rises symmetrically and there is no tongue deviation. Hearing is intact to conversational tone. Tone: Tone is good throughout. Coordination:  The patient has no dysdiadichokinesia or dysmetria. Motor: Strength is 5/5 in the bilateral upper and lower extremities.  Shoulder shrug is equal bilaterally.  There is no pronator drift.  There are no fasciculations noted. Gait and Station: The patient is able to ambulate without difficulty.   MOVEMENT EXAM: Tremor:  There is no significant hand tremor. chin tremor is present but it is minor.     LABS  Lab Results  Component Value Date   [id number] 05/16/2012   I was able to get a copy of lab work from his primary care physician from 06/30/2014.  His B12 was still on the low side at 376, but it was improved from previous at 255.  His TSH was normal at 2.06.  His CBC and chemistry were normal.  His liver function tests were normal.     Assessment/Plan   1.  Essential Tremor  -This is somewhat atypical in that the chin tremor is most prominent.  However, given the fact that he has a family history of tremor and it has been present in him for over 30 years, this likely represents a form of essential tremor, and not just benign chin tremor.  Explained to the patient that chin tremor is very resistant to treatment and likely will never go away, despite the fact that hand tremor has been well treated.  It does look better today Will stay on primidone 50 mg bid.  Changed to 90 day supply 2.  Peripheral neuropathy, idiopathic.  -His labs were unremarkable.  I did talk to him about safety, especially since he climbs up and down ladders.  I would like to see him not to do that. 3. obstructive sleep apnea syndrome, overall moderate.  -continue  with CPAP.  Doing better now that his CPAP mask is not leaking as much and pressure was adjusted.   4.  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  Talked about staying off of elbows.   5.  Nocturnal leg cramping  -try tonic water.  Talked about this last several visits.  Wrote this down for him. 6.  F/u in 6 months

## 2015-07-07 NOTE — Patient Instructions (Signed)
Try tonic water for your leg cramps - get it at the regular grocery store

## 2015-08-15 ENCOUNTER — Other Ambulatory Visit: Payer: Self-pay | Admitting: Family Medicine

## 2015-08-15 MED ORDER — CLONAZEPAM 0.5 MG PO TABS: 0.2500 mg | ORAL_TABLET | Freq: Every day | ORAL | Status: DC

## 2015-08-15 MED ORDER — PRIMIDONE 50 MG PO TABS
50.0000 mg | ORAL_TABLET | Freq: Two times a day (BID) | ORAL | Status: DC
Start: 2015-08-15 — End: 2018-04-28

## 2015-08-15 NOTE — Telephone Encounter (Signed)
Faxed refill request for 90 day supply of Primidone & Clonazepam.

## 2015-09-22 DIAGNOSIS — G4733 Obstructive sleep apnea (adult) (pediatric): Secondary | ICD-10-CM | POA: Diagnosis not present

## 2015-11-28 DIAGNOSIS — G4733 Obstructive sleep apnea (adult) (pediatric): Secondary | ICD-10-CM | POA: Diagnosis not present

## 2015-11-28 DIAGNOSIS — G609 Hereditary and idiopathic neuropathy, unspecified: Secondary | ICD-10-CM | POA: Diagnosis not present

## 2015-11-28 DIAGNOSIS — E538 Deficiency of other specified B group vitamins: Secondary | ICD-10-CM | POA: Diagnosis not present

## 2015-11-28 DIAGNOSIS — Z0000 Encounter for general adult medical examination without abnormal findings: Secondary | ICD-10-CM | POA: Diagnosis not present

## 2015-11-28 DIAGNOSIS — Z125 Encounter for screening for malignant neoplasm of prostate: Secondary | ICD-10-CM | POA: Diagnosis not present

## 2015-11-28 DIAGNOSIS — E559 Vitamin D deficiency, unspecified: Secondary | ICD-10-CM | POA: Diagnosis not present

## 2015-11-28 DIAGNOSIS — E782 Mixed hyperlipidemia: Secondary | ICD-10-CM | POA: Diagnosis not present

## 2015-11-28 DIAGNOSIS — G250 Essential tremor: Secondary | ICD-10-CM | POA: Diagnosis not present

## 2015-11-28 DIAGNOSIS — K219 Gastro-esophageal reflux disease without esophagitis: Secondary | ICD-10-CM | POA: Diagnosis not present

## 2015-11-28 DIAGNOSIS — M170 Bilateral primary osteoarthritis of knee: Secondary | ICD-10-CM | POA: Diagnosis not present

## 2015-11-28 DIAGNOSIS — Z1389 Encounter for screening for other disorder: Secondary | ICD-10-CM | POA: Diagnosis not present

## 2015-11-28 DIAGNOSIS — N529 Male erectile dysfunction, unspecified: Secondary | ICD-10-CM | POA: Diagnosis not present

## 2016-01-11 ENCOUNTER — Ambulatory Visit: Payer: PPO | Admitting: Neurology

## 2016-01-24 DIAGNOSIS — H02831 Dermatochalasis of right upper eyelid: Secondary | ICD-10-CM | POA: Diagnosis not present

## 2016-01-24 DIAGNOSIS — H524 Presbyopia: Secondary | ICD-10-CM | POA: Diagnosis not present

## 2016-01-24 DIAGNOSIS — H02834 Dermatochalasis of left upper eyelid: Secondary | ICD-10-CM | POA: Diagnosis not present

## 2016-01-24 DIAGNOSIS — Z961 Presence of intraocular lens: Secondary | ICD-10-CM | POA: Diagnosis not present

## 2016-01-26 NOTE — Progress Notes (Deleted)
Subjective:   Stephen Anderson was seen in consultation in the movement disorder clinic.  The evaluation is for tremor.  His PCP is [name],[name], MD.   The patient is a 75 y.o. right handed male with a history of tremor.Onset of symptoms was 30+ years ago.   Tremor primarily involves the bilateral hand and chin.   Since he quit smoking 4 years ago, his hand tremor went away.   Tremor exacerbated by pressure on the chin.   He is not biting himself because of the tremor.   It only bothers him a little but it bothers his wife.  There is a family hx of tremor in his mother.  He is on primidone and has been on it for 30 years.  He is on 50 mg, 2 tablets twice per day.  He thinks that it helps.  He reports arm/hand and leg paresthesias for years.  He has definite paresthesias from the knee down.  He is on neurontin for this.  He has some loss of balance.  He does not fall on the level ground.  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    12/01/12 update:  I have not seen the pt since Feb.  He is on primidone 100 mg bid and lat visit klonopin was added.  He is only taking 1/2 tablet at night and it works well.  He has had some sleepiness, but does not think that it is related to the Klonopin.  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  He thinks that it may just be because he works hard.  He is up on ladders.  He would like to try and get down on some of his medications.  10/29/13 update:  The patient returns today for followup.  It has been approximately one year since I have seen the patient.  He has a history of essential tremor.  Last visit, I did decrease his morning dosage of primidone because of sleepiness but he didn't do that and is on 100 mg bid (is supposed to be on 100 q hs).  He is also on clonazepam 0.5 mg, half a tablet at night, that he primarily uses for sleep.  He called me recently for refill of that, but he was told that he needed to followup  before that was refilled.  The patient reports overall he has done well over the last year.  Symptoms have been fairly stable.  No falls.  No hallucinations.   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  He goes to bed b/w 9-10 pm and awakens one time to use the restroom but has trouble falling back asleep. He will watch TV to get back to sleep for 30 mins.  He snores.  He wakes himself up with the snorting.  He gets up about 6:30 AM to an alarm clock but even without it, he doesn't sleep in.  He feels refreshed until he eats breakfast and then he feels like he could fall back asleep but he works as a Curator still.  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  He has to keep a cup of crushed ice near him while driving and that keeps him awake.  He doesn't fall asleep while eating.    He had labs since last visit for PN.  His B12 was 506, folate 13.8, RPR was negative and there was no monoclonal proteins in the serum or urinary protein electrophoresis.    He did have diverticulitis since our last visit (3 weeks ago).  He is off antibiotics.  He is not completely back to normal.  12/30/13 update:  The patient is returning today for followup.  He is on primidone 100 mg twice a day for his essential tremor.  States that tremor is well controlled.  Is on 1/2 tablet of 0.5 mg clonazepam for insomnia.  However, the patient was complaining of excessive daytime hypersomnolence.  Therefore, last visit I sent him for a nocturnal polysomnogram.  His apnea hypoxia index was 21 with an O2 nadir of 84%.  CPAP was recommended at 8.  The patient did not want to proceed with that, so he presents today to talk further about that.  Pt states that the night that he did the cpap trial he had awful headache the following day and for 4 days after.  Worried about trying cpap because of that.  02/25/14 update: Pt returns for f/u.  Wearing CPAP  faithfully (I reviewed compliance report) and despite that, continues to have EDS.  CPAP causes dry mouth.  He is on primidone, 100mg  in the AM but he stopped the 100 at night.  Still sleepy.  Takes 1/2 tablet of klonopin at night.  If sits during the daytime, will fall asleep.  Has to carry a cup of ice in order to drive so doesn't fall asleep and as long as he has that, can stay awake.  Also c/o leg cramping at night.  Also c/o R pinky and ring paresthesias, especially with painting and resting arm on a chair.  06/28/14 update:  I tried to split up the patient's primidone to 50 mg twice a day instead of 100 mg all at once to see if his excessive daytime hypersomnolence would be better, but it really was not.  Hand tremor is under good control but he has significant chin tremor that never seems to change.  Therefore, I referred him for a sleep consult.  This got moved back and it is now scheduled for April 13.  He does have obstructive sleep apnea syndrome and is wearing his CPAP faithfully.  He thinks that it helps him sleep at night but he still can sleep all day long.  He is on a very small dosage of clonazepam at night of 0.5 mg, half a tablet at night.  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    01/07/15 update:  The patient has a history of essential tremor and is on primidone, 50 mg twice a day.  He states that his tremor is doing is doing great.  He is taking 1/2 tablet of the klonopin at night.  He also has a history of obstructive sleep apnea syndrome and despite treatment has complained for quite some time of daytime hypersomnolence.  I sent him to see Dr. Gwenette Greet, and I reviewed his records.  Dr. Gwenette Greet noted that the patient had significant mask leak.  The patient changed masks and despite that his downloads showed that his apnea was not well treated.  He was supposed to be set up for [employer], but the company initially did not contact him to do that.  Then, they did contact him but he went on vacation  for 22 days.   The patient did have a follow-up appointment with Dr. Elsworth Soho on September but he had to  cancel his appt and he couldn't get in until Jan.  He does state that he is doing better with his new CPAP machine and he can tell a big difference.  His dad died since our last visit.  08-03-15 update:  The patient returns today for follow-up.  He is on primidone, 50 mg twice a day.  He thinks that he is stable and "it is a lot better than it has been" but his wife notices it sometimes.  He also has history of obstructive sleep apnea syndrome.  He saw Dr. Annamaria Boots for the first time on 04/21/2015.  His CPAP was just increased to 10 mm of water.  He is doing better with this and is having less leak with the new mask.  He remains on clonazepam 0.5 mg, half a tablet at night for insomnia.  Still having leg cramping - did not try tonic water.  01/30/16 update:  The patient returns today for follow-up, on primidone, 50 mg twice a day.  He is stable with this medication.  He is doing well on his CPAP and states that he has been wearing this faithfully.  He is still on clonazepam 0.5 mg, half a tablet at night.  Current/Previously tried tremor medications: primidone  Current medications that may exacerbate tremor:  n/a   Allergies  Allergen Reactions  . Oxycontin [Oxycodone Hcl] Hives    Current Outpatient Prescriptions on File Prior to Visit  Medication Sig Dispense Refill  . aspirin 81 MG tablet Take 81 mg by mouth daily.    Marland Kitchen atorvastatin (LIPITOR) 40 MG tablet Take 40 mg by mouth daily.    Marland Kitchen CHERRY PO Take by mouth daily. Reported on 04/21/2015    . cholecalciferol (VITAMIN D) 1000 UNITS tablet Take 1,000 Units by mouth daily.    . clonazePAM (KLONOPIN) 0.5 MG tablet Take 0.5 tablets (0.25 mg total) by mouth at bedtime. Take 1/2 tablet by mouth every night 45 tablet 1  . hydrocortisone cream 1 % 1 application as directed.    Marland Kitchen omeprazole (PRILOSEC) 40 MG capsule Take 40 mg by mouth 2 (two) times  daily.     . primidone (MYSOLINE) 50 MG tablet Take 1 tablet (50 mg total) by mouth 2 (two) times daily. 180 tablet 1  . vitamin B-12 (CYANOCOBALAMIN) 500 MCG tablet Take 500 mcg by mouth daily.     No current facility-administered medications on file prior to visit.     Past Medical History:  Diagnosis Date  . Essential tremor   . Hypercholesteremia   . Reflux     Past Surgical History:  Procedure Laterality Date  . CATARACT EXTRACTION, BILATERAL    . GALLBLADDER SURGERY    . HERNIA REPAIR     bilateral  . MELANOMA EXCISION      Social History   Social History  . Marital status: Married    Spouse name: N/A  . Number of children: y  . Years of education: N/A   Occupational History  . retired     Nurse, mental health order; painting   Social History Main Topics  . Smoking status: Former Smoker    Packs/day: 1.00    Years: 50.00    Types: Cigarettes    Quit date: 04/06/2008  . Smokeless tobacco: Former Systems developer    Quit date: 05/16/2002  . Alcohol use No  . Drug use: No  . Sexual activity: Not on file   Other Topics Concern  . Not on file  Social History Narrative  . No narrative on file    Family Status  Relation Status  . Mother Alive   tremor  . Father Deceased   complications hip fx  . Brother Alive   2, MI  . Sister Deceased at age 31 months old   unknown  . Child Alive   2, healthy    Review of Systems A complete 10 system ROS was obtained and was negative apart from what is mentioned.   Objective:   VITALS:   There were no vitals filed for this visit. Gen:  Appears stated age and in NAD. HEENT:  Normocephalic, atraumatic. The mucous membranes are moist. The superficial temporal arteries are without ropiness or tenderness. Cardiovascular: Regular rate and rhythm. Lungs: Clear to auscultation bilaterally. Neck: There are no carotid bruits noted bilaterally.  NEUROLOGICAL:  Orientation:  The patient is alert and oriented x 3.  Recent and remote  memory are intact.  Attention span and concentration are normal.  Able to name objects and repeat without trouble.  Fund of knowledge is appropriate Cranial nerves: There is good facial symmetry. The pupils are equal round and reactive to light bilaterally. Fundoscopic exam reveals clear disc margins bilaterally. Extraocular muscles are intact and visual fields are full to confrontational testing. Speech is fluent and clear. Soft palate rises symmetrically and there is no tongue deviation. Hearing is intact to conversational tone. Tone: Tone is good throughout. Coordination:  The patient has no dysdiadichokinesia or dysmetria. Motor: Strength is 5/5 in the bilateral upper and lower extremities.  Shoulder shrug is equal bilaterally.  There is no pronator drift.  There are no fasciculations noted. Gait and Station: The patient is able to ambulate without difficulty.   MOVEMENT EXAM: Tremor:  There is no significant hand tremor. chin tremor is present but it is minor.     LABS  Lab Results  Component Value Date   [id number] 05/16/2012   I was able to get a copy of lab work from his primary care physician from 06/30/2014.  His B12 was still on the low side at 376, but it was improved from previous at 255.  His TSH was normal at 2.06.  His CBC and chemistry were normal.  His liver function tests were normal.     Assessment/Plan   1.  Essential Tremor  -Will stay on primidone 50 mg bid.  Changed to 90 day supply 2.  Peripheral neuropathy, idiopathic.  -His labs were unremarkable.  I did talk to him about safety, especially since he climbs up and down ladders.  I would like to see him not to do that. 3. obstructive sleep apnea syndrome, overall moderate.  -continue with CPAP.  Doing better now that his CPAP mask is not leaking as much and pressure was adjusted.   4.  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  Talked about staying off of elbows.   5.  Nocturnal leg  cramping  -try tonic water.  Talked about this last several visits.  Wrote this down for him. 6.  F/u in 6 months

## 2016-01-30 ENCOUNTER — Ambulatory Visit: Payer: PPO | Admitting: Neurology

## 2016-02-07 ENCOUNTER — Ambulatory Visit: Payer: PPO | Admitting: Neurology

## 2016-02-16 ENCOUNTER — Ambulatory Visit: Payer: PPO | Admitting: Neurology

## 2016-03-05 DIAGNOSIS — R05 Cough: Secondary | ICD-10-CM | POA: Diagnosis not present

## 2016-03-05 DIAGNOSIS — J0100 Acute maxillary sinusitis, unspecified: Secondary | ICD-10-CM | POA: Diagnosis not present

## 2016-03-28 DIAGNOSIS — D229 Melanocytic nevi, unspecified: Secondary | ICD-10-CM | POA: Diagnosis not present

## 2016-03-28 DIAGNOSIS — L821 Other seborrheic keratosis: Secondary | ICD-10-CM | POA: Diagnosis not present

## 2016-03-28 DIAGNOSIS — L570 Actinic keratosis: Secondary | ICD-10-CM | POA: Diagnosis not present

## 2016-04-20 ENCOUNTER — Ambulatory Visit (INDEPENDENT_AMBULATORY_CARE_PROVIDER_SITE_OTHER): Payer: PPO | Admitting: Internal Medicine

## 2016-04-20 ENCOUNTER — Encounter: Payer: Self-pay | Admitting: Internal Medicine

## 2016-04-20 VITALS — BP 114/76 | HR 74 | Ht 69.5 in | Wt 187.6 lb

## 2016-04-20 DIAGNOSIS — G4733 Obstructive sleep apnea (adult) (pediatric): Secondary | ICD-10-CM | POA: Diagnosis not present

## 2016-04-20 DIAGNOSIS — G250 Essential tremor: Secondary | ICD-10-CM | POA: Diagnosis not present

## 2016-04-20 NOTE — Progress Notes (Signed)
Subjective:    Patient ID: Stephen Anderson, male    DOB: Apr 21, 1940, 76 y.o.   MRN: [id number]  HPI male former smoker  followed for OSA complicated by peripheral neuropathy, essential tremor NPSG 11/2013  AHI 21/ hr, titrated to 8 cwp  -----------------------------------------------------------  04/20/2016-76 year old male former smoker followed for OSA, complicated by peripheral neuropathy, essential tremor CPAP 10/[facility] FOLLOWS FOR: DME: [facility]. Pt wears CPAP nightly and DL attached. No new supplies needed at this time.  Mouth breathing despite fullface mask and waking in the morning with very dry mouth. Long vacation this summer without taking his CPAP. He was driving most of the time and chewed on ice chips to stay alert. At last visit he had complained of residual daytime fatigue and we had increased pressure from 8-10. Residual AHI has now improved to 6.5/hour with good compliance but he still describes daytime tiredness.  ROS-see HPI    "+" = pos Constitutional:    weight loss, night sweats, fevers, chills, fatigue, lassitude. HEENT:    headaches, difficulty swallowing, tooth/dental problems, sore throat,       sneezing, itching, ear ache, nasal congestion, post nasal drip, snoring CV:    chest pain, orthopnea, PND, swelling in lower extremities, anasarca,                                                   dizziness, palpitations Resp:   shortness of breath with exertion or at rest.                productive cough,   non-productive cough, coughing up of blood.              change in color of mucus.  wheezing.   Skin:    rash or lesions. GI:  No-   heartburn, indigestion, abdominal pain, nausea, vomiting, diarrhea,                 change in bowel habits, loss of appetite GU: dysuria, change in color of urine, no urgency or frequency.   flank pain. MS:   joint pain, stiffness, decreased range of motion, back pain. Neuro-    No new issues Psych:  change in mood or affect.   depression or anxiety.   memory loss.    Objective:  OBJ- Physical Exam  Exam similar to last visit General- Alert, Oriented, Affect-appropriate, Distress- none acute, trim Skin- rash-none, lesions- none, excoriation- none Lymphadenopathy- none Head- atraumatic            Eyes- Gross vision intact, PERRLA, conjunctivae and secretions clear            Ears- Hearing, canals-normal            Nose- Clear, no-Septal dev, mucus, polyps, erosion, perforation             Throat- Mallampati II , mucosa clear , drainage- none, tonsils- atrophic Neck- flexible , trachea midline, no stridor , thyroid nl, carotid no bruit Chest - symmetrical excursion , unlabored           Heart/CV- RRR , no murmur , no gallop  , no rub, nl s1 s2                           - JVD- none , edema-  none, stasis changes- none, varices- none           Lung- clear to P&A, wheeze- none, cough- none , dullness-none, rub- none           Chest wall-  Abd-  Br/ Gen/ Rectal- Not done, not indicated Extrem- cyanosis- none, clubbing, none, atrophy- none, strength- nl Neuro- grossly intact to observation  Assessment & Plan:

## 2016-04-20 NOTE — Patient Instructions (Signed)
Order- DME [facility]-please increase CPAP to 12, continue mask of choice, humidifier, supplies, AirView  Dx OSA                     Please add chin strap  Please call as needed

## 2016-04-20 NOTE — Assessment & Plan Note (Signed)
CPAP compliance is good and CPAP control is better based on download but he still complains of some daytime sleepiness. He understands his responsibility to drive safely and to maintain good sleep habits. Plan-increase CPAP pressure from 10-12. Try adding chinstrap to help with dry mouth complaint.

## 2016-04-20 NOTE — Assessment & Plan Note (Signed)
He masks tremor some with shifting of position, but it is not overt or intrusive at this visit.

## 2016-06-26 DIAGNOSIS — H43812 Vitreous degeneration, left eye: Secondary | ICD-10-CM | POA: Diagnosis not present

## 2016-06-27 DIAGNOSIS — L281 Prurigo nodularis: Secondary | ICD-10-CM | POA: Diagnosis not present

## 2016-06-27 DIAGNOSIS — K219 Gastro-esophageal reflux disease without esophagitis: Secondary | ICD-10-CM | POA: Diagnosis not present

## 2016-06-27 DIAGNOSIS — L218 Other seborrheic dermatitis: Secondary | ICD-10-CM | POA: Diagnosis not present

## 2016-06-27 DIAGNOSIS — Z86010 Personal history of colonic polyps: Secondary | ICD-10-CM | POA: Diagnosis not present

## 2016-08-27 DIAGNOSIS — K219 Gastro-esophageal reflux disease without esophagitis: Secondary | ICD-10-CM | POA: Diagnosis not present

## 2016-08-27 DIAGNOSIS — Z86010 Personal history of colonic polyps: Secondary | ICD-10-CM | POA: Diagnosis not present

## 2016-10-19 DIAGNOSIS — K219 Gastro-esophageal reflux disease without esophagitis: Secondary | ICD-10-CM | POA: Diagnosis not present

## 2016-10-19 DIAGNOSIS — Z86010 Personal history of colonic polyps: Secondary | ICD-10-CM | POA: Diagnosis not present

## 2016-10-19 LAB — HM COLONOSCOPY

## 2016-12-05 DIAGNOSIS — G250 Essential tremor: Secondary | ICD-10-CM | POA: Diagnosis not present

## 2016-12-05 DIAGNOSIS — N529 Male erectile dysfunction, unspecified: Secondary | ICD-10-CM | POA: Diagnosis not present

## 2016-12-05 DIAGNOSIS — K219 Gastro-esophageal reflux disease without esophagitis: Secondary | ICD-10-CM | POA: Diagnosis not present

## 2016-12-05 DIAGNOSIS — Z79899 Other long term (current) drug therapy: Secondary | ICD-10-CM | POA: Diagnosis not present

## 2016-12-05 DIAGNOSIS — Z125 Encounter for screening for malignant neoplasm of prostate: Secondary | ICD-10-CM | POA: Diagnosis not present

## 2016-12-05 DIAGNOSIS — E559 Vitamin D deficiency, unspecified: Secondary | ICD-10-CM | POA: Diagnosis not present

## 2016-12-05 DIAGNOSIS — E538 Deficiency of other specified B group vitamins: Secondary | ICD-10-CM | POA: Diagnosis not present

## 2016-12-05 DIAGNOSIS — M25562 Pain in left knee: Secondary | ICD-10-CM | POA: Diagnosis not present

## 2016-12-05 DIAGNOSIS — Z1389 Encounter for screening for other disorder: Secondary | ICD-10-CM | POA: Diagnosis not present

## 2016-12-05 DIAGNOSIS — Z0000 Encounter for general adult medical examination without abnormal findings: Secondary | ICD-10-CM | POA: Diagnosis not present

## 2016-12-05 DIAGNOSIS — G609 Hereditary and idiopathic neuropathy, unspecified: Secondary | ICD-10-CM | POA: Diagnosis not present

## 2016-12-05 DIAGNOSIS — E782 Mixed hyperlipidemia: Secondary | ICD-10-CM | POA: Diagnosis not present

## 2017-01-30 DIAGNOSIS — Z961 Presence of intraocular lens: Secondary | ICD-10-CM | POA: Diagnosis not present

## 2017-01-30 DIAGNOSIS — H52203 Unspecified astigmatism, bilateral: Secondary | ICD-10-CM | POA: Diagnosis not present

## 2017-04-22 ENCOUNTER — Ambulatory Visit: Payer: PPO | Admitting: Internal Medicine

## 2017-04-23 ENCOUNTER — Encounter: Payer: Self-pay | Admitting: Internal Medicine

## 2017-04-23 ENCOUNTER — Ambulatory Visit: Payer: PPO | Admitting: Internal Medicine

## 2017-04-23 DIAGNOSIS — G609 Hereditary and idiopathic neuropathy, unspecified: Secondary | ICD-10-CM

## 2017-04-23 DIAGNOSIS — G4733 Obstructive sleep apnea (adult) (pediatric): Secondary | ICD-10-CM | POA: Diagnosis not present

## 2017-04-23 NOTE — Progress Notes (Signed)
Subjective:    Patient ID: Stephen Anderson, male    DOB: [birth year], 77 y.o.   MRN: [id number]  HPI male former smoker  followed for OSA complicated by peripheral neuropathy, essential tremor NPSG 11/2013  AHI 21/ hr, titrated to 8 cwp  -----------------------------------------------------------  04/20/2016-77 year old male former smoker followed for OSA, complicated by peripheral neuropathy, essential tremor CPAP 10/Advanced FOLLOWS FOR: DME: [facility]. Pt wears CPAP nightly and DL attached. No new supplies needed at this time.  Mouth breathing despite fullface mask and waking in the morning with very dry mouth. Long vacation this summer without taking his CPAP. He was driving most of the time and chewed on ice chips to stay alert. At last visit he had complained of residual daytime fatigue and we had increased pressure from 8-10. Residual AHI has now improved to 6.5/hour with good compliance but he still describes daytime tiredness.  04/23/17-  77 year old male former smoker followed for OSA, complicated by peripheral neuropathy, essential tremor CPAP 12/Advanced ----OSA; DME: [facility]. Pt wears CPAP nightly and DL attached. Pressure works well for patient. No new supplies needed at this time.  Download 90% compliance, AHI 1.1/hour He sleeps and breathes much more comfortably using CPAP and has a good seal with his full facemask despite his beard.  Notices occasional dry mouth in the morning with no real pattern identified. Bothered by peripheral neuropathy still.  ROS-see HPI    "+" = pos Constitutional:    weight loss, night sweats, fevers, chills, fatigue, lassitude. HEENT:    headaches, difficulty swallowing, tooth/dental problems, sore throat,       sneezing, itching, ear ache, nasal congestion, post nasal drip, snoring CV:    chest pain, orthopnea, PND, swelling in lower extremities, anasarca,                                                  dizziness, palpitations Resp:   shortness of  breath with exertion or at rest.                productive cough,   non-productive cough, coughing up of blood.              change in color of mucus.  wheezing.   Skin:    rash or lesions. GI:  No-   heartburn, indigestion, abdominal pain, nausea, vomiting, diarrhea,                 change in bowel habits, loss of appetite GU: dysuria, change in color of urine, no urgency or frequency.   flank pain. MS:   joint pain, stiffness, decreased range of motion, back pain. Neuro-    + symptoms of peripheral neuropathy both lower legs. Psych:  change in mood or affect.  depression or anxiety.   memory loss.    Objective:  OBJ- Physical Exam  Exam similar to last visit General- Alert, Oriented, Affect-appropriate, Distress- none acute, trim Skin- rash-none, lesions- none, excoriation- none Lymphadenopathy- none Head- atraumatic            Eyes- Gross vision intact, PERRLA, conjunctivae and secretions clear            Ears- Hearing, canals-normal            Nose- Clear, no-Septal dev, mucus, polyps, erosion, perforation  Throat- Mallampati II , mucosa clear , drainage- none, tonsils- atrophic Neck- flexible , trachea midline, no stridor , thyroid nl, carotid no bruit Chest - symmetrical excursion , unlabored           Heart/CV- RRR , no murmur , no gallop  , no rub, nl s1 s2                           - JVD- none , edema- none, stasis changes- none, varices- none           Lung- clear to P&A, wheeze- none, cough- none , dullness-none, rub- none           Chest wall-  Abd-  Br/ Gen/ Rectal- Not done, not indicated Extrem- cyanosis- none, clubbing, none, atrophy- none, strength- nl Neuro- grossly intact to observation  Assessment & Plan:

## 2017-04-23 NOTE — Patient Instructions (Signed)
We can continue CPAP 12, mask of choice, humidifier, supplies, AirView  Your manual should show you how to adjust your humidifier up a little to help with dry mouth --------------Call your DME company if you need to.  Ok to use a mouth - wetter product like Biotene regularly if needed  Please call if we can help

## 2017-04-23 NOTE — Assessment & Plan Note (Signed)
This is troublesome most days.  Cared for elsewhere.

## 2017-04-23 NOTE — Assessment & Plan Note (Signed)
He is very pleased with current CPAP set up.  Download confirms excellent compliance and control and he does sleep better with it.  We discussed dry mouth.  He will check his manual for instructions on adjusting humidifier and call DME company if needed.  We did discuss use of Biotene. Plan-continue CPAP 12

## 2017-09-25 DIAGNOSIS — M170 Bilateral primary osteoarthritis of knee: Secondary | ICD-10-CM | POA: Diagnosis not present

## 2017-09-25 DIAGNOSIS — R5382 Chronic fatigue, unspecified: Secondary | ICD-10-CM | POA: Diagnosis not present

## 2017-09-25 DIAGNOSIS — E559 Vitamin D deficiency, unspecified: Secondary | ICD-10-CM | POA: Diagnosis not present

## 2017-09-25 DIAGNOSIS — G4733 Obstructive sleep apnea (adult) (pediatric): Secondary | ICD-10-CM | POA: Diagnosis not present

## 2017-09-25 DIAGNOSIS — E538 Deficiency of other specified B group vitamins: Secondary | ICD-10-CM | POA: Diagnosis not present

## 2017-09-25 DIAGNOSIS — G609 Hereditary and idiopathic neuropathy, unspecified: Secondary | ICD-10-CM | POA: Diagnosis not present

## 2017-09-25 LAB — HEPATIC FUNCTION PANEL
ALT: 22 (ref 10–40)
AST: 20 (ref 14–40)

## 2017-09-25 LAB — CBC AND DIFFERENTIAL
HCT: 44 (ref 41–53)
Hemoglobin: 14.9 (ref 13.5–17.5)
Platelets: 175 (ref 150–399)
WBC: 8.2

## 2017-09-25 LAB — VITAMIN D 25 HYDROXY (VIT D DEFICIENCY, FRACTURES): Vit D, 25-Hydroxy: 30.1

## 2017-09-25 LAB — BASIC METABOLIC PANEL
BUN: 19 (ref 4–21)
Glucose: 88
Potassium: 4.5 (ref 3.4–5.3)
Sodium: 143 (ref 137–147)

## 2017-09-25 LAB — VITAMIN B12: Vitamin B-12: 312

## 2017-09-25 LAB — TSH: TSH: 1.57 (ref <=5.90)

## 2017-10-18 DIAGNOSIS — M130 Polyarthritis, unspecified: Secondary | ICD-10-CM | POA: Diagnosis not present

## 2017-10-18 DIAGNOSIS — M1712 Unilateral primary osteoarthritis, left knee: Secondary | ICD-10-CM | POA: Diagnosis not present

## 2017-10-18 DIAGNOSIS — M1711 Unilateral primary osteoarthritis, right knee: Secondary | ICD-10-CM | POA: Diagnosis not present

## 2017-11-14 ENCOUNTER — Ambulatory Visit: Payer: PPO | Admitting: Podiatry

## 2017-11-14 ENCOUNTER — Telehealth: Payer: Self-pay | Admitting: *Deleted

## 2017-11-14 DIAGNOSIS — G609 Hereditary and idiopathic neuropathy, unspecified: Secondary | ICD-10-CM | POA: Diagnosis not present

## 2017-11-14 DIAGNOSIS — M792 Neuralgia and neuritis, unspecified: Secondary | ICD-10-CM | POA: Diagnosis not present

## 2017-11-14 DIAGNOSIS — G6289 Other specified polyneuropathies: Secondary | ICD-10-CM

## 2017-11-14 NOTE — Telephone Encounter (Signed)
Dr. March Rummage ordered referral pt to neurology for idiopathic peripheral neuropathy. Faxed referral to [facility].

## 2017-11-20 DIAGNOSIS — E663 Overweight: Secondary | ICD-10-CM | POA: Diagnosis not present

## 2017-11-20 DIAGNOSIS — M150 Primary generalized (osteo)arthritis: Secondary | ICD-10-CM | POA: Diagnosis not present

## 2017-11-20 DIAGNOSIS — Z6826 Body mass index (BMI) 26.0-26.9, adult: Secondary | ICD-10-CM | POA: Diagnosis not present

## 2017-11-20 DIAGNOSIS — M79641 Pain in right hand: Secondary | ICD-10-CM | POA: Diagnosis not present

## 2017-11-20 DIAGNOSIS — M2550 Pain in unspecified joint: Secondary | ICD-10-CM | POA: Diagnosis not present

## 2017-11-20 DIAGNOSIS — M79642 Pain in left hand: Secondary | ICD-10-CM | POA: Diagnosis not present

## 2017-11-22 ENCOUNTER — Ambulatory Visit: Payer: PPO | Admitting: Neurology

## 2017-12-05 ENCOUNTER — Ambulatory Visit: Payer: PPO | Admitting: Neurology

## 2017-12-05 ENCOUNTER — Encounter: Payer: Self-pay | Admitting: Neurology

## 2017-12-05 VITALS — BP 110/60 | HR 64 | Ht 70.0 in | Wt 182.0 lb

## 2017-12-05 DIAGNOSIS — G250 Essential tremor: Secondary | ICD-10-CM | POA: Diagnosis not present

## 2017-12-05 DIAGNOSIS — G609 Hereditary and idiopathic neuropathy, unspecified: Secondary | ICD-10-CM | POA: Diagnosis not present

## 2017-12-05 MED ORDER — GABAPENTIN 300 MG PO CAPS: 300.0000 mg | ORAL_CAPSULE | Freq: Three times a day (TID) | ORAL | 1 refills | Status: DC

## 2017-12-05 NOTE — Progress Notes (Addendum)
Subjective:   Stephen Anderson was seen in consultation in the movement disorder clinic.  The evaluation is for tremor.  His PCP is [name], MD.   The patient is a 77 y.o. right handed male with a history of tremor.Onset of symptoms was 30+ years ago.   Tremor primarily involves the bilateral hand and chin.   Since he quit smoking 4 years ago, his hand tremor went away.   Tremor exacerbated by pressure on the chin.   He is not biting himself because of the tremor.   It only bothers him a little but it bothers his wife.  There is a family hx of tremor in his mother.  He is on primidone and has been on it for 30 years.  He is on 50 mg, 2 tablets twice per day.  He thinks that it helps.  He reports arm/hand and leg paresthesias for years.  He has definite paresthesias from the knee down.  He is on neurontin for this.  He has some loss of balance.  He does not fall on the level ground.  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    12/01/12 update:  I have not seen the pt since Feb.  He is on primidone 100 mg bid and lat visit klonopin was added.  He is only taking 1/2 tablet at night and it works well.  He has had some sleepiness, but does not think that it is related to the Klonopin.  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  He thinks that it may just be because he works hard.  He is up on ladders.  He would like to try and get down on some of his medications.  10/29/13 update:  The patient returns today for followup.  It has been approximately one year since I have seen the patient.  He has a history of essential tremor.  Last visit, I did decrease his morning dosage of primidone because of sleepiness but he didn't do that and is on 100 mg bid (is supposed to be on 100 q hs).  He is also on clonazepam 0.5 mg, half a tablet at night, that he primarily uses for sleep.  He called me recently for refill of that, but he was told that he needed to followup  before that was refilled.  The patient reports overall he has done well over the last year.  Symptoms have been fairly stable.  No falls.  No hallucinations.   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  He goes to bed b/w 9-10 pm and awakens one time to use the restroom but has trouble falling back asleep. He will watch TV to get back to sleep for 30 mins.  He snores.  He wakes himself up with the snorting.  He gets up about 6:30 AM to an alarm clock but even without it, he doesn't sleep in.  He feels refreshed until he eats breakfast and then he feels like he could fall back asleep but he works as a Curator still.  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  He has to keep a cup of crushed ice near him while driving and that keeps him awake.  He doesn't fall asleep while eating.    He had labs since last visit for  PN.  His B12 was 506, folate 13.8, RPR was negative and there was no monoclonal proteins in the serum or urinary protein electrophoresis.    He did have diverticulitis since our last visit (3 weeks ago).  He is off antibiotics.  He is not completely back to normal.  12/30/13 update:  The patient is returning today for followup.  He is on primidone 100 mg twice a day for his essential tremor.  States that tremor is well controlled.  Is on 1/2 tablet of 0.5 mg clonazepam for insomnia.  However, the patient was complaining of excessive daytime hypersomnolence.  Therefore, last visit I sent him for a nocturnal polysomnogram.  His apnea hypoxia index was 21 with an O2 nadir of 84%.  CPAP was recommended at 8.  The patient did not want to proceed with that, so he presents today to talk further about that.  Pt states that the night that he did the cpap trial he had awful headache the following day and for 4 days after.  Worried about trying cpap because of that.  02/25/14 update: Pt returns for f/u.  Wearing CPAP  faithfully (I reviewed compliance report) and despite that, continues to have EDS.  CPAP causes dry mouth.  He is on primidone, 110m in the AM but he stopped the 100 at night.  Still sleepy.  Takes 1/2 tablet of klonopin at night.  If sits during the daytime, will fall asleep.  Has to carry a cup of ice in order to drive so doesn't fall asleep and as long as he has that, can stay awake.  Also c/o leg cramping at night.  Also c/o R pinky and ring paresthesias, especially with painting and resting arm on a chair.  06/28/14 update:  I tried to split up the patient's primidone to 50 mg twice a day instead of 100 mg all at once to see if his excessive daytime hypersomnolence would be better, but it really was not.  Hand tremor is under good control but he has significant chin tremor that never seems to change.  Therefore, I referred him for a sleep consult.  This got moved back and it is now scheduled for April 13.  He does have obstructive sleep apnea syndrome and is wearing his CPAP faithfully.  He thinks that it helps him sleep at night but he still can sleep all day long.  He is on a very small dosage of clonazepam at night of 0.5 mg, half a tablet at night.  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    01/07/15 update:  The patient has a history of essential tremor and is on primidone, 50 mg twice a day.  He states that his tremor is doing is doing great.  He is taking 1/2 tablet of the klonopin at night.  He also has a history of obstructive sleep apnea syndrome and despite treatment has complained for quite some time of daytime hypersomnolence.  I sent him to see Dr. CGwenette Greet and I reviewed his records.  Dr. CGwenette Greetnoted that the patient had significant mask leak.  The patient changed masks and despite that his downloads showed that his apnea was not well treated.  He was supposed to be set up for [employer], but the company initially did not contact him to do that.  Then, they did contact him but he went on vacation  for 22 days.   The patient did have a follow-up appointment with Dr. AElsworth Sohoon September but he  had to cancel his appt and he couldn't get in until Jan.  He does state that he is doing better with his new CPAP machine and he can tell a big difference.  His dad died since our last visit.  07-21-15 update:  The patient returns today for follow-up.  He is on primidone, 50 mg twice a day.  He thinks that he is stable and "it is a lot better than it has been" but his wife notices it sometimes.  He also has history of obstructive sleep apnea syndrome.  He saw Dr. Annamaria Boots for the first time on 04/21/2015.  His CPAP was just increased to 10 mm of water.  He is doing better with this and is having less leak with the new mask.  He remains on clonazepam 0.5 mg, half a tablet at night for insomnia.  Still having leg cramping - did not try tonic water.  12/05/17 update: Patient is seen today for neuropathy.  I have not seen him in 2-1/2 years, at which point I was seeing him for essential tremor, but he had documented neuropathy at the time.  Records have been reviewed.  Patient was seen by [facility] on August 8 for similar symptoms, but unfortunately notes are not completed.  He states that he was given a compounded cream of baclofen/ibuprofen/gabapentin and lidocaine.  Feet feel like they are on fire and bottom of feet feel "like leather."  "they keep me awake every night."  Has to wear socks at night.  He is on gabapentin 300 mg at night.  He hasn't been on higher dosages.    Current/Previously tried tremor medications: primidone  Current medications that may exacerbate tremor:  n/a   Allergies  Allergen Reactions  . Oxycontin [Oxycodone Hcl] Hives    Current Outpatient Medications on File Prior to Visit  Medication Sig Dispense Refill  . Acetaminophen (TYLENOL ARTHRITIS EXT RELIEF PO) Take 1 tablet by mouth every 8 (eight) hours.    Marland Kitchen aspirin 81 MG tablet Take 81 mg by mouth every other day.     Marland Kitchen  atorvastatin (LIPITOR) 40 MG tablet Take 40 mg by mouth daily.    . Clobetasol Propionate 0.05 % shampoo clobetasol 0.05 % shampoo-skin cleanser no.28 topical kit    . diclofenac sodium (VOLTAREN) 1 % GEL diclofenac 1 % topical gel    . fluticasone (CUTIVATE) 0.05 % cream fluticasone propionate 0.05 % topical cream   1 application twice a day by topical route.    [facility]  Peripheral Neuropathy Cream- Bupivacaine 1%, Doxepin 3%, Gabapentin 6%, Pentoxifylline 3%, Topiramate 1% Apply 1-2 grams to affected area 3-4 times daily Qty. 120 gm 3 refills    . pantoprazole (PROTONIX) 40 MG tablet Take 40 mg by mouth 2 (two) times daily.     . primidone (MYSOLINE) 50 MG tablet Take 1 tablet (50 mg total) by mouth 2 (two) times daily. 180 tablet 1   No current facility-administered medications on file prior to visit.     Past Medical History:  Diagnosis Date  . Essential tremor   . Hypercholesteremia   . Peripheral neuropathy   . Reflux     Past Surgical History:  Procedure Laterality Date  . CATARACT EXTRACTION, BILATERAL    . GALLBLADDER SURGERY    . HERNIA REPAIR     bilateral  . MELANOMA EXCISION      Social History   Socioeconomic History  . Marital status: Married  Spouse name: Not on file  . Number of children: y  . Years of education: Not on file  . Highest education level: Not on file  Occupational History  . Occupation: retired    Comment: Nurse, mental health order; painting  Social Needs  . Financial resource strain: Not on file  . Food insecurity:    Worry: Not on file    Inability: Not on file  . Transportation needs:    Medical: Not on file    Non-medical: Not on file  Tobacco Use  . Smoking status: Former Smoker    Packs/day: 1.00    Years: 50.00    Pack years: 50.00    Types: Cigarettes    Last attempt to quit: 04/06/2008    Years since quitting: 9.6  . Smokeless tobacco: Former Systems developer    Quit date: 05/16/2002  Substance and Sexual  Activity  . Alcohol use: No    Alcohol/week: 0.0 standard drinks  . Drug use: No  . Sexual activity: Not on file  Lifestyle  . Physical activity:    Days per week: Not on file    Minutes per session: Not on file  . Stress: Not on file  Relationships  . Social connections:    Talks on phone: Not on file    Gets together: Not on file    Attends religious service: Not on file    Active member of club or organization: Not on file    Attends meetings of clubs or organizations: Not on file    Relationship status: Not on file  . Intimate partner violence:    Fear of current or ex partner: Not on file    Emotionally abused: Not on file    Physically abused: Not on file    Forced sexual activity: Not on file  Other Topics Concern  . Not on file  Social History Narrative  . Not on file    Family Status  Relation Name Status  . Mother  Alive       tremor  . Father  Deceased       complications hip fx  . Brother  Alive       2, MI  . Sister  Deceased at age 40 months old       unknown  . Child  Alive       2, healthy    Review of Systems Review of Systems  Constitutional: Negative.   HENT: Negative.   Cardiovascular: Negative.   Gastrointestinal: Positive for heartburn.  Genitourinary: Negative.   Musculoskeletal: Negative.   Skin: Negative.   Neurological: Positive for tremors (stable).  Endo/Heme/Allergies: Negative.       Objective:   VITALS:   Vitals:   12/05/17 1059  BP: 110/60  Pulse: 64  SpO2: 93%  Weight: 182 lb (82.6 kg)  Height: _0  (1.778 m)   GEN:  The patient appears stated age and is in NAD. HEENT:  Normocephalic, atraumatic.  The mucous membranes are moist. The superficial temporal arteries are without ropiness or tenderness. CV:  RRR Lungs:  CTAB Neck/HEME:  There are no carotid bruits bilaterally.  Neurological examination:  Orientation: The patient is alert and oriented x3. Cranial nerves: There is good facial symmetry. The speech  is fluent and clear. Soft palate rises symmetrically and there is no tongue deviation. Hearing is intact to conversational tone. Sensation: Sensation is intact to light touch throughout.  Vibration is markedly decreased in distal fashion. Motor: Strength is  5/5 in the bilateral upper and lower extremities.   Shoulder shrug is equal and symmetric.  There is no pronator drift. Deep tendon reflexes: 2/4 the bilateral biceps, triceps, brachioradialis, patella and absent at the bilateral Achilles.  Movement examination: Tone: There is normal tone. Abnormal movements: There is mild chin tremor. Coordination:  There is no decremation with RAM's, with any form of RAMS, including alternating supination and pronation of the forearm, hand opening and closing, finger taps, heel taps and toe taps. Gait and Station: The patient walks well.   LABS  Lab Results  Component Value Date   [id number] 341 05/16/2012   No results found for: TSH   Chemistry      Component Value Date/Time   NA 139 05/16/2012 1005   K 4.6 05/16/2012 1005   CL 105 05/16/2012 1005   CO2 25 05/16/2012 1005   BUN 20 05/16/2012 1005   CREATININE 1.2 05/16/2012 1005      Component Value Date/Time   CALCIUM 9.2 05/16/2012 1005     Addendum labs: Lab work is received from the patient's primary care physician and is dated September 25, 2017.  White blood cells are 8.2, hemoglobin 14.9, hematocrit 44.1 and platelets 175.  Sodium was 143, potassium 4.5, chloride 106, CO2 26, BUN 19 and creatinine 1.24.  AST 20, ALT 22, alkaline phosphatase 66.  B12 was 312.  TSH was 1.57.     Assessment/Plan   1.  Essential Tremor  -Remains on primidone, 50 mill grams twice per day. 2.  Peripheral neuropathy, idiopathic.  -His labs were unremarkable several years ago and symptoms are just worsening.  He is on low-dose gabapentin, 300 mg at night.  We will increase this to twice daily dosing for a week and then if tolerated he can increase to 3 times  per day dosing.  Also recommended that he try the over-the-counter lidocaine patches, which he can wear for 12 hours and take off for 12 hours.   -safety discussed 3. obstructive sleep apnea syndrome, overall moderate.  -continue with CPAP.  Doing better now that his CPAP mask is not leaking as much and pressure was adjusted.   4.  F/u 5-6 months.  Much greater than 50% of this visit was spent in counseling and coordinating care.  Total face to face time:  25 min

## 2017-12-05 NOTE — Patient Instructions (Addendum)
Increase gabapentin - 300 mg - 1 tablet twice per day for a week and then 1 tablet three times per day thereafter if no side effects  Get over the counter lidocaine patches (aspercreme makes one).  You can wear it for 12 hours and then take it off for 12 hours

## 2017-12-18 NOTE — Progress Notes (Signed)
  Subjective:  Patient ID: Stephen Anderson, male    DOB: Feb 18, 1941,  MRN: [id number]  Chief Complaint  Patient presents with  . Peripheral Neuropathy    numbness in toes    77 y.o. male presents with the above complaint.  Reports numbness in the feet and toes.  States is getting worse.  Reports burning and aching in the feet getting cold.  States that it wakes him up at night.  Denies diabetes.  Starting to develop balance issues.  Review of Systems: Negative except as noted in the HPI. Denies N/V/F/Ch.  Past Medical History:  Diagnosis Date  . Essential tremor   . Hypercholesteremia   . Peripheral neuropathy   . Reflux     Current Outpatient Medications:  .  NON FORMULARY, [facility]  Peripheral Neuropathy Cream- Bupivacaine 1%, Doxepin 3%, Gabapentin 6%, Pentoxifylline 3%, Topiramate 1% Apply 1-2 grams to affected area 3-4 times daily Qty. 120 gm 3 refills, Disp: , Rfl:  .  Acetaminophen (TYLENOL ARTHRITIS EXT RELIEF PO), Take 1 tablet by mouth every 8 (eight) hours., Disp: , Rfl:  .  aspirin 81 MG tablet, Take 81 mg by mouth every other day. , Disp: , Rfl:  .  atorvastatin (LIPITOR) 40 MG tablet, Take 40 mg by mouth daily., Disp: , Rfl:  .  Clobetasol Propionate 0.05 % shampoo, clobetasol 0.05 % shampoo-skin cleanser no.28 topical kit, Disp: , Rfl:  .  diclofenac sodium (VOLTAREN) 1 % GEL, diclofenac 1 % topical gel, Disp: , Rfl:  .  fluticasone (CUTIVATE) 0.05 % cream, fluticasone propionate 0.05 % topical cream   1 application twice a day by topical route., Disp: , Rfl:  .  gabapentin (NEURONTIN) 300 MG capsule, Take 1 capsule (300 mg total) by mouth 3 (three) times daily., Disp: 270 capsule, Rfl: 1 .  pantoprazole (PROTONIX) 40 MG tablet, Take 40 mg by mouth 2 (two) times daily. , Disp: , Rfl:  .  primidone (MYSOLINE) 50 MG tablet, Take 1 tablet (50 mg total) by mouth 2 (two) times daily., Disp: 180 tablet, Rfl: 1  Social History   Tobacco Use  Smoking Status Former  Smoker  . Packs/day: 1.00  . Years: 50.00  . Pack years: 50.00  . Types: Cigarettes  . Last attempt to quit: 04/06/2008  . Years since quitting: 9.7  Smokeless Tobacco Former Systems developer  . Quit date: 05/16/2002    Allergies  Allergen Reactions  . Oxycontin [Oxycodone Hcl] Hives   Objective:  There were no vitals filed for this visit. There is no height or weight on file to calculate BMI. Constitutional Well developed. Well nourished.  Vascular Dorsalis pedis pulses palpable bilaterally. Posterior tibial pulses palpable bilaterally. Capillary refill normal to all digits.  No cyanosis or clubbing noted. Pedal hair growth normal.  Neurologic Normal speech. Oriented to person, place, and time. Epicritic sensation to light touch grossly diminished bilaterally.  Dermatologic Nails well groomed and normal in appearance. No open wounds. No skin lesions.  Orthopedic: Normal joint ROM without pain or crepitus bilaterally. No visible deformities. No bony tenderness.   Radiographs: None Assessment:   1. Idiopathic neuropathy   2. Neuralgia and neuritis    Plan:  Patient was evaluated and treated and all questions answered.  Idiopathic peripheral neuropathy -Educated on possible causes of neuropathy -Rx for compound pain cream -We will refer to neurology for evaluation  Return if symptoms worsen or fail to improve.

## 2017-12-19 ENCOUNTER — Ambulatory Visit: Payer: PPO | Admitting: Neurology

## 2017-12-19 ENCOUNTER — Encounter

## 2018-02-12 DIAGNOSIS — L281 Prurigo nodularis: Secondary | ICD-10-CM | POA: Diagnosis not present

## 2018-02-12 DIAGNOSIS — X32XXXD Exposure to sunlight, subsequent encounter: Secondary | ICD-10-CM | POA: Diagnosis not present

## 2018-02-12 DIAGNOSIS — L570 Actinic keratosis: Secondary | ICD-10-CM | POA: Diagnosis not present

## 2018-02-24 ENCOUNTER — Other Ambulatory Visit: Payer: Self-pay

## 2018-02-24 ENCOUNTER — Encounter: Payer: Self-pay | Admitting: Family Medicine

## 2018-02-24 ENCOUNTER — Ambulatory Visit (INDEPENDENT_AMBULATORY_CARE_PROVIDER_SITE_OTHER): Payer: PPO | Admitting: Family Medicine

## 2018-02-24 VITALS — BP 136/82 | HR 60 | Temp 99.0°F | Resp 16 | Ht 69.5 in | Wt 186.0 lb

## 2018-02-24 DIAGNOSIS — G4733 Obstructive sleep apnea (adult) (pediatric): Secondary | ICD-10-CM | POA: Diagnosis not present

## 2018-02-24 DIAGNOSIS — K219 Gastro-esophageal reflux disease without esophagitis: Secondary | ICD-10-CM

## 2018-02-24 DIAGNOSIS — N138 Other obstructive and reflux uropathy: Secondary | ICD-10-CM | POA: Insufficient documentation

## 2018-02-24 DIAGNOSIS — G250 Essential tremor: Secondary | ICD-10-CM | POA: Diagnosis not present

## 2018-02-24 DIAGNOSIS — M150 Primary generalized (osteo)arthritis: Secondary | ICD-10-CM

## 2018-02-24 DIAGNOSIS — N401 Enlarged prostate with lower urinary tract symptoms: Secondary | ICD-10-CM | POA: Insufficient documentation

## 2018-02-24 DIAGNOSIS — E782 Mixed hyperlipidemia: Secondary | ICD-10-CM

## 2018-02-24 DIAGNOSIS — G609 Hereditary and idiopathic neuropathy, unspecified: Secondary | ICD-10-CM

## 2018-02-24 DIAGNOSIS — M159 Polyosteoarthritis, unspecified: Secondary | ICD-10-CM | POA: Insufficient documentation

## 2018-02-24 HISTORY — DX: Gastro-esophageal reflux disease without esophagitis: K21.9

## 2018-02-24 HISTORY — DX: Mixed hyperlipidemia: E78.2

## 2018-02-24 NOTE — Patient Instructions (Signed)
Please return at your convenience for your annual complete physical; please come fasting. May also schedule an AWV.   It was a pleasure meeting you today! Thank you for choosing [place] to meet your healthcare needs! I truly look forward to working with you. If you have any questions or concerns, please send me a message via Mychart or call the office at [phone].

## 2018-02-24 NOTE — Progress Notes (Signed)
Subjective  CC:  Chief Complaint  Patient presents with  . Establish Care    HPI: Stephen Anderson is a 77 y.o. male who presents to [facility] at [facility] today to establish care with me as a new patient.  Former pt of [place], Dr. Addison Lank; I reviewed records from chart, neuro, ortho, pulm. He has the following concerns or needs:  Very pleasant 77 year old male with history as listed below in the problem list.  We went over his main medical problems in detail.  Currently being treated for idiopathic peripheral neuropathy and essential tremor by neurology.  Symptoms are fairly well controlled.  Neurontin was recently increased.  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  Has obstructive sleep apnea on CPAP that has been recently adjusted.  Has been on statins for a long length of time for hyperlipidemia.  He is uncertain if this is contributing to his arthralgias.  Chronic GERD on PPI.  Recently reporting symptoms of mild urinary obstruction due to presumed enlarged prostate.  Nocturia once nightly.  No problems with hesitancy but mildly decreased stream.  No urinary tract infections.  He admits to ED.  Is never taken medications or had a evaluated before.  No history or family history of prostate cancer.  Health maintenance: Due for complete physical with lab work.  Due for annual wellness visit.  Declines flu shot.  He believes his pneumonia vaccinations are up-to-date.  Shingrix is up-to-date.  [facility] records for further review.  Colon cancer screening was normal within the last 5 years.  Assessment  1. Idiopathic peripheral neuropathy   2. Gastroesophageal reflux disease, esophagitis presence not specified   3. Mixed hyperlipidemia   4. Primary osteoarthritis involving multiple joints   5. Obstructive sleep apnea   6. Essential tremor   7. BPH with obstruction/lower urinary tract symptoms      Plan   Multiple chronic medical problems are currently  well controlled.  Continue current medications.  Follow-up at patient's convenience for complete physical annual wellness visit with lab work at that time.  Likely has symptomatic BPH: We will check prostate at next visit.  Patient to consider if problematic enough to start medications.  Follow up:  Return in about 6 weeks (around 04/07/2018) for complete physical, AWV at patient's convenience. No orders of the defined types were placed in this encounter.  No orders of the defined types were placed in this encounter.    Depression screen [facility] 2/9 02/24/2018  Decreased Interest 0  Down, Depressed, Hopeless 0  PHQ - 2 Score 0  Altered sleeping 0  Tired, decreased energy 0  Change in appetite 0  Feeling bad or failure about yourself  0  Trouble concentrating 0  Moving slowly or fidgety/restless 0  Suicidal thoughts 0  PHQ-9 Score 0    We updated and reviewed the patient's past history in detail and it is documented below.  Patient Active Problem List   Diagnosis Date Noted  . GERD (gastroesophageal reflux disease) 02/24/2018  . Mixed hyperlipidemia 02/24/2018  . 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 Obstructive sleep apnea 12/30/2013    NPSG 11/2013:  AHI 21/hr, titration to 8cm Auto CPAP 09/09/14 to 09/21/14 >> used on 13 of 13 nights with average 7 hrs and 22 min.  Average AHI is 4 with median CPAP 10 cm H2O and 95 th percentile CPAP 14 cm H20.    . Essential tremor  05/16/2012  . Peripheral neuropathy 05/16/2012   Health Maintenance  Topic Date Due  . Samul Dada  06/15/1959  . PNA vac Low Risk Adult (1 of 2 - PCV13) 06/16/2007  . INFLUENZA VACCINE  11/07/2017  . COLONOSCOPY  02/02/2025   Immunization History  Administered Date(s) Administered  . Zoster 03/16/2017  . Zoster Recombinat (Shingrix) 04/03/2017, 06/17/2017   Current Meds  Medication Sig  . Acetaminophen (TYLENOL ARTHRITIS EXT RELIEF PO)  Take 1 tablet by mouth every 8 (eight) hours.  Marland Kitchen aspirin 81 MG tablet Take 81 mg by mouth every other day.   Marland Kitchen atorvastatin (LIPITOR) 40 MG tablet Take 40 mg by mouth daily.  . Clobetasol Propionate 0.05 % shampoo clobetasol 0.05 % shampoo-skin cleanser no.28 topical kit  . diclofenac sodium (VOLTAREN) 1 % GEL diclofenac 1 % topical gel  . fluticasone (CUTIVATE) 0.05 % cream fluticasone propionate 0.05 % topical cream   1 application twice a day by topical route.  . gabapentin (NEURONTIN) 300 MG capsule Take 1 capsule (300 mg total) by mouth 3 (three) times daily.  Salley Scarlet [facility]  Peripheral Neuropathy Cream- Bupivacaine 1%, Doxepin 3%, Gabapentin 6%, Pentoxifylline 3%, Topiramate 1% Apply 1-2 grams to affected area 3-4 times daily Qty. 120 gm 3 refills  . pantoprazole (PROTONIX) 40 MG tablet Take 40 mg by mouth 2 (two) times daily.   . primidone (MYSOLINE) 50 MG tablet Take 1 tablet (50 mg total) by mouth 2 (two) times daily.    Allergies: Patient is allergic to oxycontin [oxycodone hcl]. Past Medical History Patient  has a past medical history of Essential tremor, GERD (gastroesophageal reflux disease) (02/24/2018), Hypercholesteremia, Mixed hyperlipidemia (02/24/2018), and Peripheral neuropathy. Past Surgical History Patient  has a past surgical history that includes Gallbladder surgery; Hernia repair; Cataract extraction, bilateral; and Melanoma excision. Family History: Patient family history includes Bladder Cancer in his brother; Heart disease in his brother; Lung cancer in his father. Social History:  Patient  reports that he quit smoking about 9 years ago. His smoking use included cigarettes. He has a 50.00 pack-year smoking history. He quit smokeless tobacco use about 15 years ago. He reports that he does not drink alcohol or use drugs.  Review of Systems: Constitutional: negative for fever or malaise Ophthalmic: negative for photophobia, double vision or  loss of vision Cardiovascular: negative for chest pain, dyspnea on exertion, or new LE swelling Respiratory: negative for SOB or persistent cough Gastrointestinal: negative for abdominal pain, change in bowel habits or melena Genitourinary: negative for dysuria or gross hematuria Musculoskeletal: negative for new gait disturbance or muscular weakness Integumentary: negative for new or persistent rashes Neurological: negative for TIA or stroke symptoms Psychiatric: negative for SI or delusions Allergic/Immunologic: negative for hives  Patient Care Team    Relationship Specialty Notifications Start End  Leamon Arnt, MD PCP - General Family Medicine  02/24/18   Tat, Eustace Quail, DO Consulting Physician Neurology  02/24/18   Deneise Lever, MD Consulting Physician Pulmonary Disease  02/24/18   Sydnee Cabal, MD Consulting Physician Orthopedic Surgery  02/24/18     Objective  Vitals: BP 136/82   Pulse 60   Temp 99 F (37.2 C) (Oral)   Resp 16   Ht 5' 9.5" (1.765 m)   Wt 186 lb (84.4 kg)   SpO2 97%   BMI 27.07 kg/m  General:  Well developed, well nourished, no acute distress  Psych:  Alert and oriented,normal mood and affect HEENT:  Normocephalic, atraumatic,  non-icteric sclera, PERRL, oropharynx is without mass or exudate, supple neck without adenopathy, mass or thyromegaly Cardiovascular:  RRR without gallop, rub or murmur, no peripheral edema Respiratory:  Good breath sounds bilaterally, CTAB with normal respiratory effort Skin:  Warm, no rashes or suspicious lesions noted Neurologic:    Mental status is normal. Gross motor and sensory exams are normal. Normal gait   Commons side effects, risks, benefits, and alternatives for medications and treatment plan prescribed today were discussed, and the patient expressed understanding of the given instructions. Patient is instructed to call or message via MyChart if he/she has any questions or concerns regarding our treatment plan.  No barriers to understanding were identified. We discussed Red Flag symptoms and signs in detail. Patient expressed understanding regarding what to do in case of urgent or emergency type symptoms.   Medication list was reconciled, printed and provided to the patient in AVS. Patient instructions and summary information was reviewed with the patient as documented in the AVS. This note was prepared with assistance of Dragon voice recognition software. Occasional wrong-word or sound-a-like substitutions may have occurred due to the inherent limitations of voice recognition software

## 2018-03-03 ENCOUNTER — Encounter: Payer: Self-pay | Admitting: Emergency Medicine

## 2018-03-25 NOTE — Progress Notes (Signed)
Subjective:   Stephen Anderson is a 77 y.o. male who presents for Medicare Annual/Subsequent preventive examination.  Review of Systems:  No ROS.  Medicare Wellness Visit. Additional risk factors are reflected in the social history.  Cardiac Risk Factors include: advanced age (>6mn, >>31women);dyslipidemia;male gender;family history of premature cardiovascular disease   Sleep patterns: Sleeps 6 hours, uses CPAP. Naps occasionally.  Home Safety/Smoke Alarms: Feels safe in home. Smoke alarms in place.  Living environment; residence and Firearm Safety: Lives with wife in 1 story home. Rail at steps.  Seat Belt Safety/Bike Helmet: Wears seat belt.   Male:   CCS-Colonoscopy 10/19/2016. No recall.     PSA- No results found for: PSA      Objective:    Vitals: BP (!) 146/80 (BP Location: Left Arm, Patient Position: Sitting, Cuff Size: Normal)   Pulse (!) 55   Temp 97.9 F (36.6 C) (Temporal)   Resp 18   Ht _0  (1.778 m)   Wt 185 lb (83.9 kg)   SpO2 97%   BMI 26.54 kg/m   Body mass index is 26.54 kg/m.  Advanced Directives 03/26/2018 12/15/2013 11/30/2013  Does Patient Have a Medical Advance Directive? Yes No No  Type of Advance Directive Living will;Healthcare Power of Attorney - -  [place]? No - copy requested - -  Would patient like information on creating a medical advance directive? - No - patient declined information No - patient declined information    Tobacco Social History   Tobacco Use  Smoking Status Former Smoker  . Packs/day: 1.00  . Years: 50.00  . Pack years: 50.00  . Types: Cigarettes  . Last attempt to quit: 04/06/2008  . Years since quitting: 9.9  Smokeless Tobacco Former [employer] developer . Quit date: 05/16/2002     Counseling given: Not Answered    Past Medical History:  Diagnosis Date  . Essential tremor   . GERD (gastroesophageal reflux disease) 02/24/2018  . Hypercholesteremia   . Mixed hyperlipidemia  02/24/2018  . Peripheral neuropathy    Past Surgical History:  Procedure Laterality Date  . CATARACT EXTRACTION, BILATERAL    . GALLBLADDER SURGERY    . HERNIA REPAIR     bilateral  . MELANOMA EXCISION     Family History  Problem Relation Age of Onset  . Lung cancer Father   . Bladder Cancer Brother   . Heart disease Brother    Social History   Socioeconomic History  . Marital status: Married    Spouse name: Not on file  . Number of children: y  . Years of education: Not on file  . Highest education level: Not on file  Occupational History  . Occupation: retired    Comment: sNurse, mental healthorder; painting  Social Needs  . Financial resource strain: Not on file  . Food insecurity:    Worry: Not on file    Inability: Not on file  . Transportation needs:    Medical: Not on file    Non-medical: Not on file  Tobacco Use  . Smoking status: Former Smoker    Packs/day: 1.00    Years: 50.00    Pack years: 50.00    Types: Cigarettes    Last attempt to quit: 04/06/2008    Years since quitting: 9.9  . Smokeless tobacco: Former USystems developer   Quit date: 05/16/2002  Substance and Sexual Activity  . Alcohol use: No    Alcohol/week: 0.0  standard drinks  . Drug use: No  . Sexual activity: Not on file  Lifestyle  . Physical activity:    Days per week: Not on file    Minutes per session: Not on file  . Stress: Not on file  Relationships  . Social connections:    Talks on phone: Not on file    Gets together: Not on file    Attends religious service: Not on file    Active member of club or organization: Not on file    Attends meetings of clubs or organizations: Not on file    Relationship status: Not on file  Other Topics Concern  . Not on file  Social History Narrative  . Not on file    Outpatient Encounter Medications as of 03/26/2018  Medication Sig  . Acetaminophen (TYLENOL ARTHRITIS EXT RELIEF PO) Take 1 tablet by mouth every 8 (eight) hours.  Marland Kitchen aspirin 81 MG tablet Take 81  mg by mouth every other day.   Marland Kitchen atorvastatin (LIPITOR) 40 MG tablet Take 40 mg by mouth daily.  . Clobetasol Propionate 0.05 % shampoo clobetasol 0.05 % shampoo-skin cleanser no.28 topical kit  . diclofenac sodium (VOLTAREN) 1 % GEL diclofenac 1 % topical gel  . fluticasone (CUTIVATE) 0.05 % cream fluticasone propionate 0.05 % topical cream   1 application twice a day by topical route.  . gabapentin (NEURONTIN) 300 MG capsule Take 1 capsule (300 mg total) by mouth 3 (three) times daily.  . NON FORMULARY CPAP  . pantoprazole (PROTONIX) 40 MG tablet Take 40 mg by mouth 2 (two) times daily.   . primidone (MYSOLINE) 50 MG tablet Take 1 tablet (50 mg total) by mouth 2 (two) times daily.  Salley Scarlet [facility]  Peripheral Neuropathy Cream- Bupivacaine 1%, Doxepin 3%, Gabapentin 6%, Pentoxifylline 3%, Topiramate 1% Apply 1-2 grams to affected area 3-4 times daily Qty. 120 gm 3 refills   No facility-administered encounter medications on file as of 03/26/2018.     Activities of Daily Living In your present state of health, do you have any difficulty performing the following activities: 03/26/2018  Hearing? N  Vision? N  Difficulty concentrating or making decisions? N  Walking or climbing stairs? N  Dressing or bathing? N  Doing errands, shopping? N  Preparing Food and eating ? N  Using the Toilet? N  In the past six months, have you accidently leaked urine? N  Do you have problems with loss of bowel control? N  Managing your Medications? N  Managing your Finances? N  Housekeeping or managing your Housekeeping? N  Some recent data might be hidden    Patient Care Team: Leamon Arnt, MD as PCP - General (Family Medicine) Tat, Eustace Quail, DO as Consulting Physician (Neurology) Deneise Lever, MD as Consulting Physician (Pulmonary Disease) Sydnee Cabal, MD as Consulting Physician (Orthopedic Surgery) Ronne Binning (Dentistry) Prudencio Pair as Physician  Assistant (Emergency Medicine) Sydnee Cabal, MD as Consulting Physician (Orthopedic Surgery) Allyn Kenner, MD (Dermatology) Evelina Bucy, DPM as Consulting Physician (Podiatry) Ronald Lobo, MD as Consulting Physician (Gastroenterology) Luberta Mutter, MD as Consulting Physician (Ophthalmology)   Assessment:   This is a routine wellness examination for Stephen Anderson.  Exercise Activities and Dietary recommendations Current Exercise Habits: The patient does not participate in regular exercise at present(stays active with yard work; painting), Exercise limited by: None identified   Diet (meal preparation, eat out, water intake, caffeinated beverages, dairy products, fruits and vegetables): Drinks Pepsi  and rarely water.   Breakfast: Sausage/bacon, eggs, toast/jelly, milk Lunch: sandwich; snack Dinner: fried meat (90% time), vegetables.  Apple daily.   Goals    . Increase physical activity     Increase activity by walking more.        Fall Risk Fall Risk  03/26/2018 12/05/2017  Falls in the past year? 1 Yes  Comment slipped off ladder -  Number falls in past yr: 0 2 or more  Injury with Fall? 0 No  Risk Factor Category  - High Fall Risk  Follow up Falls prevention discussed Falls evaluation completed     Depression Screen PHQ 2/9 Scores 03/26/2018 02/24/2018  PHQ - 2 Score 0 0  PHQ- 9 Score - 0    Cognitive Function MMSE - Mini Mental State Exam 03/26/2018  Orientation to time 5  Orientation to Place 5  Registration 3  Attention/ Calculation 5  Recall 0  Language- name 2 objects 2  Language- repeat 1  Language- follow 3 step command 3  Language- read & follow direction 1  Write a sentence 1  Copy design 1  Total score 27        Immunization History  Administered Date(s) Administered  . Pneumococcal Conjugate-13 10/21/2013  . Pneumococcal Polysaccharide-23 07/17/2010  . Td 01/07/2009  . Tdap 07/10/2010  . Zoster 03/16/2017  . Zoster Recombinat  (Shingrix) 04/03/2017, 06/17/2017     Screening Tests Health Maintenance  Topic Date Due  . TETANUS/TDAP  07/09/2020  . COLONOSCOPY  10/20/2026  . PNA vac Low Risk Adult  Completed  . INFLUENZA VACCINE  Discontinued        Plan:    Bring a copy of your living will and/or healthcare power of attorney to your next office visit.  Continue doing brain stimulating activities (puzzles, reading, adult coloring books, staying active) to keep memory sharp.   I have personally reviewed and noted the following in the patient's chart:   . Medical and social history . Use of alcohol, tobacco or illicit drugs  . Current medications and supplements . Functional ability and status . Nutritional status . Physical activity . Advanced directives . List of other physicians . Hospitalizations, surgeries, and ER visits in previous 12 months . Vitals . Screenings to include cognitive, depression, and falls . Referrals and appointments  In addition, I have reviewed and discussed with patient certain preventive protocols, quality metrics, and best practice recommendations. A written personalized care plan for preventive services as well as general preventive health recommendations were provided to patient.     Gerilyn Nestle, RN  03/26/2018

## 2018-03-26 ENCOUNTER — Ambulatory Visit (INDEPENDENT_AMBULATORY_CARE_PROVIDER_SITE_OTHER): Payer: PPO

## 2018-03-26 ENCOUNTER — Other Ambulatory Visit: Payer: Self-pay

## 2018-03-26 ENCOUNTER — Encounter: Payer: Self-pay | Admitting: Family Medicine

## 2018-03-26 ENCOUNTER — Encounter: Payer: PPO | Admitting: Family Medicine

## 2018-03-26 ENCOUNTER — Ambulatory Visit (INDEPENDENT_AMBULATORY_CARE_PROVIDER_SITE_OTHER): Payer: PPO | Admitting: Family Medicine

## 2018-03-26 VITALS — BP 146/80 | HR 55 | Temp 97.9°F | Resp 18 | Ht 70.0 in | Wt 185.0 lb

## 2018-03-26 VITALS — BP 132/80 | HR 55 | Temp 97.9°F | Resp 18 | Ht 70.0 in | Wt 185.0 lb

## 2018-03-26 DIAGNOSIS — Z0000 Encounter for general adult medical examination without abnormal findings: Secondary | ICD-10-CM | POA: Diagnosis not present

## 2018-03-26 DIAGNOSIS — N138 Other obstructive and reflux uropathy: Secondary | ICD-10-CM | POA: Diagnosis not present

## 2018-03-26 DIAGNOSIS — Z85820 Personal history of malignant melanoma of skin: Secondary | ICD-10-CM

## 2018-03-26 DIAGNOSIS — E782 Mixed hyperlipidemia: Secondary | ICD-10-CM | POA: Diagnosis not present

## 2018-03-26 DIAGNOSIS — Z0001 Encounter for general adult medical examination with abnormal findings: Secondary | ICD-10-CM | POA: Diagnosis not present

## 2018-03-26 DIAGNOSIS — N401 Enlarged prostate with lower urinary tract symptoms: Secondary | ICD-10-CM | POA: Diagnosis not present

## 2018-03-26 DIAGNOSIS — L570 Actinic keratosis: Secondary | ICD-10-CM | POA: Diagnosis not present

## 2018-03-26 DIAGNOSIS — G609 Hereditary and idiopathic neuropathy, unspecified: Secondary | ICD-10-CM

## 2018-03-26 HISTORY — DX: Personal history of malignant melanoma of skin: Z85.820

## 2018-03-26 LAB — COMPREHENSIVE METABOLIC PANEL
ALT: 21 U/L (ref 0–53)
AST: 18 U/L (ref 0–37)
Albumin: 4.4 g/dL (ref 3.5–5.2)
Alkaline Phosphatase: 61 U/L (ref 39–117)
BUN: 16 mg/dL (ref 6–23)
CO2: 29 mEq/L (ref 19–32)
Calcium: 9.1 mg/dL (ref 8.4–10.5)
Chloride: 104 mEq/L (ref 96–112)
Creatinine, Ser: 1.26 mg/dL (ref 0.40–1.50)
GFR: 59.17 mL/min — ABNORMAL LOW (ref >60.00)
Glucose, Bld: 107 mg/dL — ABNORMAL HIGH (ref 70–99)
Potassium: 4.8 mEq/L (ref 3.5–5.1)
Sodium: 141 mEq/L (ref 135–145)
Total Bilirubin: 0.4 mg/dL (ref 0.2–1.2)
Total Protein: 6.7 g/dL (ref 6.0–8.3)

## 2018-03-26 LAB — TSH: TSH: 1.05 u[IU]/mL (ref 0.35–4.50)

## 2018-03-26 LAB — CBC WITH DIFFERENTIAL/PLATELET
Basophils Absolute: 0 10*3/uL (ref 0.0–0.1)
Basophils Relative: 0.6 % (ref 0.0–3.0)
Eosinophils Absolute: 0.1 10*3/uL (ref 0.0–0.7)
Eosinophils Relative: 1.7 % (ref 0.0–5.0)
HCT: 45.3 % (ref 39.0–52.0)
Hemoglobin: 15.2 g/dL (ref 13.0–17.0)
Lymphocytes Relative: 30.8 % (ref 12.0–46.0)
Lymphs Abs: 2 10*3/uL (ref 0.7–4.0)
MCHC: 33.5 g/dL (ref 30.0–36.0)
MCV: 90.7 fl (ref 78.0–100.0)
Monocytes Absolute: 0.6 10*3/uL (ref 0.1–1.0)
Monocytes Relative: 9.7 % (ref 3.0–12.0)
Neutro Abs: 3.8 10*3/uL (ref 1.4–7.7)
Neutrophils Relative %: 57.2 % (ref 43.0–77.0)
Platelets: 189 10*3/uL (ref 150.0–400.0)
RBC: 4.99 Mil/uL (ref 4.22–5.81)
RDW: 14 % (ref 11.5–15.5)
WBC: 6.6 10*3/uL (ref 4.0–10.5)

## 2018-03-26 LAB — POCT URINALYSIS DIPSTICK
Bilirubin, UA: NEGATIVE
Blood, UA: NEGATIVE
Glucose, UA: NEGATIVE
Ketones, UA: NEGATIVE
Leukocytes, UA: NEGATIVE
Nitrite, UA: NEGATIVE
Protein, UA: POSITIVE — AB
Spec Grav, UA: 1.025 (ref 1.010–1.025)
Urobilinogen, UA: 0.2 E.U./dL
pH, UA: 5 (ref 5.0–8.0)

## 2018-03-26 LAB — LIPID PANEL
Cholesterol: 159 mg/dL (ref 0–200)
HDL: 40 mg/dL (ref >39.00)
LDL Cholesterol: 85 mg/dL (ref 0–99)
NonHDL: 119.45
Total CHOL/HDL Ratio: 4
Triglycerides: 171 mg/dL — ABNORMAL HIGH (ref 0.0–149.0)
VLDL: 34.2 mg/dL (ref 0.0–40.0)

## 2018-03-26 LAB — PSA: PSA: 1.06 ng/mL (ref 0.10–4.00)

## 2018-03-26 NOTE — Progress Notes (Signed)
I have reviewed the documentation from the recent AWV done by Kim Broome; I agree with the documentation and will follow up on any recommendations or abnormal findings as suggested.  

## 2018-03-26 NOTE — Progress Notes (Signed)
Subjective  No chief complaint on file.  HPI: Stephen Anderson is a 77 y.o. male who presents to [facility] today for a Male Wellness Visit.   Wellness Visit: annual visit with health maintenance review and exam  Reviewed AWV today. Nl MMSE; neg depression screen. Low fall risk.   Doing fine. We discussed his prostate sxs: mild frequency and nocturia. No retention or uti.   Neuropathy is stable.   C/o bilateral rough spots on cheeks. Sees Dr. [place] Crane of dermatology. Has h/o melanoma  Lifestyle: Body mass index is 26.54 kg/m. Wt Readings from Last 3 Encounters:  03/26/18 185 lb (83.9 kg)  03/26/18 185 lb (83.9 kg)  02/24/18 186 lb (84.4 kg)   Diet: general Exercise: intermittently,   Patient Active Problem List   Diagnosis Date Noted  . History of melanoma 03/26/2018  . GERD (gastroesophageal reflux disease) 02/24/2018  . Mixed hyperlipidemia 02/24/2018  . 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 Obstructive sleep apnea 12/30/2013  . Essential tremor 05/16/2012  . Peripheral neuropathy 05/16/2012   Health Maintenance  Topic Date Due  . Samul Dada  07/09/2020  . COLONOSCOPY  10/20/2026  . PNA vac Low Risk Adult  Completed  . INFLUENZA VACCINE  Discontinued   Immunization History  Administered Date(s) Administered  . Pneumococcal Conjugate-13 10/21/2013  . Pneumococcal Polysaccharide-23 07/17/2010  . Td 01/07/2009  . Tdap 07/10/2010  . Zoster 03/16/2017  . Zoster Recombinat (Shingrix) 04/03/2017, 06/17/2017   We updated and reviewed the patient's past history in detail and it is documented below. Allergies: Patient is allergic to oxycontin [oxycodone hcl]. Past Medical History  has a past medical history of Essential tremor, GERD (gastroesophageal reflux disease) (02/24/2018), History of melanoma (03/26/2018), Hypercholesteremia, Mixed hyperlipidemia (02/24/2018),  and Peripheral neuropathy. Past Surgical History  has a past surgical history that includes Gallbladder surgery; Hernia repair; Cataract extraction, bilateral; and Melanoma excision. Social History Patient  reports that he quit smoking about 9 years ago. His smoking use included cigarettes. He has a 50.00 pack-year smoking history. He quit smokeless tobacco use about 15 years ago. He reports that he does not drink alcohol or use drugs. Family History Patient family history includes Bladder Cancer in his brother; Heart disease in his brother; Lung cancer in his father. Review of Systems: Constitutional: negative for fever or malaise Ophthalmic: negative for photophobia, double vision or loss of vision Cardiovascular: negative for chest pain, dyspnea on exertion, or new LE swelling Respiratory: negative for SOB or persistent cough Gastrointestinal: negative for abdominal pain, change in bowel habits or melena Genitourinary: negative for dysuria or gross hematuria Musculoskeletal: negative for new gait disturbance or muscular weakness Integumentary: negative for new or persistent rashes, no breast lumps Neurological: negative for TIA or stroke symptoms Psychiatric: negative for SI or delusions Allergic/Immunologic: negative for hives  Patient Care Team    Relationship Specialty Notifications Start End  Leamon Arnt, MD PCP - General Family Medicine  02/24/18   Tat, Eustace Quail, DO Consulting Physician Neurology  02/24/18   Deneise Lever, MD Consulting Physician Pulmonary Disease  02/24/18   Sydnee Cabal, MD Consulting Physician Orthopedic Surgery  02/24/18   Ronne Binning  Dentistry  03/26/18   Bjorn Pippin, PA-C Physician Assistant Emergency Medicine  03/26/18    Comment: Arthritis  Sydnee Cabal, MD Consulting Physician Orthopedic Surgery  03/26/18   Allyn Kenner, MD  Dermatology  03/26/18   March Rummage,  Christian Mate, DPM Consulting Physician Podiatry  03/26/18   Ronald Lobo, MD  Consulting Physician Gastroenterology  03/26/18   Luberta Mutter, MD Consulting Physician Ophthalmology  03/26/18    Objective  Vitals: BP 132/80   Pulse (!) 55   Temp 97.9 F (36.6 C) (Oral)   Resp 18   Ht 5\' 10"  (1.778 m)   Wt 185 lb (83.9 kg)   SpO2 97%   BMI 26.54 kg/m  General:  Well developed, well nourished, no acute distress  Psych:  Alert and orientedx3,normal mood and affect HEENT:  Normocephalic, atraumatic, non-icteric sclera, PERRL, oropharynx is clear without mass or exudate, supple neck without adenopathy, mass or thyromegaly Cardiovascular:  Normal S1, S2, RRR without gallop, rub or murmur, nondisplaced PMI, +2 distal pulses in bilateral upper and lower extremities. Respiratory:  Good breath sounds bilaterally, CTAB with normal respiratory effort Gastrointestinal: normal bowel sounds, soft, non-tender, no noted masses. No HSM MSK: no deformities, contusions. Joints are without erythema or swelling. Spine and CVA region are nontender Skin:  Warm, bilateral erythema and telangiectasias on face; several AKs Neurologic:    Mental status is normal. CN 2-11 are normal. Gross motor and sensory exams are normal. Stable gait. No tremor GU: No inguinal hernias or adenopathy are appreciated bilaterally, distal prostate palpated and mildly enlarged w/o ttp or nodules.   Cryotherapy Procedure Note  Pre-operative Diagnosis: Actinic keratosis  Post-operative Diagnosis: Actinic keratosis  Locations: bilateral cheeks  Indications: precancerous  Anesthesia: none  Procedure Details   Patient informed of risks (permanent scarring, infection, light or dark discoloration, bleeding, infection, weakness, numbness and recurrence of the lesion) and benefits of the procedure and verbal informed consent obtained. Universal time out performed  The areas are treated with liquid nitrogen therapy, frozen until ice ball extended 2 mm beyond lesion, allowed to thaw, and treated again. The  patient tolerated procedure well.  The patient was instructed on post-op care, warned that there may be blister formation, redness and pain. Recommend OTC analgesia as needed for pain.  I treated 4 lesions on the left check and 3 lesions on the right for a total of 7.  Condition: Stable  Complications: none.  Assessment  1. Annual physical exam   2. BPH with obstruction/lower urinary tract symptoms   3. Mixed hyperlipidemia   4. Idiopathic peripheral neuropathy   5. Actinic keratosis of left cheek   6. Actinic keratosis of right cheek   7. History of melanoma      Plan  Male Wellness Visit:  Age appropriate Health Maintenance and Prevention measures were discussed with patient. Included topics are cancer screening recommendations, ways to keep healthy (see AVS) including dietary and exercise recommendations, regular eye and dental care, use of seat belts, and avoidance of moderate alcohol use and tobacco use.   BMI: discussed patient's BMI and encouraged positive lifestyle modifications to help get to or maintain a target BMI.  HM needs and immunizations were addressed and ordered. See below for orders. See HM and immunization section for updates. Up to date.   Routine labs and screening tests ordered including cmp, cbc and lipids where appropriate.  Discussed recommendations regarding Vit D and calcium supplementation (see AVS)  BPH with mild sxs: will check labs and monitor for now. He will return to office if sxs worsen or progress to be bothersome.  HLD: recheck fasting today.   Neuropathy per neuro.   S/p cryotherapy on face: Routine post procedure care instructions were given to patient  in detail.   Follow up: Return in about 1 year (around 03/27/2019) for complete physical, AWV.   Commons side effects, risks, benefits, and alternatives for medications and treatment plan prescribed today were discussed, and the patient expressed understanding of the given  instructions. Patient is instructed to call or message via MyChart if he/she has any questions or concerns regarding our treatment plan. No barriers to understanding were identified. We discussed Red Flag symptoms and signs in detail. Patient expressed understanding regarding what to do in case of urgent or emergency type symptoms.   Medication list was reconciled, printed and provided to the patient in AVS. Patient instructions and summary information was reviewed with the patient as documented in the AVS. This note was prepared with assistance of Dragon voice recognition software. Occasional wrong-word or sound-a-like substitutions may have occurred due to the inherent limitations of voice recognition software  Orders Placed This Encounter  Procedures  . CBC with Differential/Platelet  . Comprehensive metabolic panel  . Lipid panel  . PSA  . TSH  . POCT urinalysis dipstick   No orders of the defined types were placed in this encounter.

## 2018-03-26 NOTE — Patient Instructions (Addendum)
Please return in 12 months for your annual complete physical; please come fasting. And AWV.   If you have any questions or concerns, please don't hesitate to send me a message via MyChart or call the office at [phone]. Thank you for visiting with [place] today! It's our pleasure caring for you.  Please do these things to maintain good health!   Exercise at least 30-45 minutes a day,  4-5 days a week.   Eat a low-fat diet with lots of fruits and vegetables, up to 7-9 servings per day.  Drink plenty of water daily. Try to drink 8 8oz glasses per day.  Seatbelts can save your life. Always wear your seatbelt.  Place Smoke Detectors on every level of your home and check batteries every year.  Eye Doctor - have an eye exam every 1-2 years  Safe sex - use condoms to protect yourself from STDs if you could be exposed to these types of infections.  Avoid heavy alcohol use. If you drink, keep it to less than 2 drinks/day and not every day.  [place].  Choose someone you trust that could speak for you if you became unable to speak for yourself.  Depression is common in our stressful world.If you're feeling down or losing interest in things you normally enjoy, please come in for a visit.

## 2018-03-26 NOTE — Patient Instructions (Addendum)
Bring a copy of your living will and/or healthcare power of attorney to your next office visit.  Continue doing brain stimulating activities (puzzles, reading, adult coloring books, staying active) to keep memory sharp.    Health Maintenance, Male A healthy lifestyle and preventive care is important for your health and wellness. Ask your health care provider about what schedule of regular examinations is right for you. What should I know about weight and diet? Eat a Healthy Diet  Eat plenty of vegetables, fruits, whole grains, low-fat dairy products, and lean protein.  Do not eat a lot of foods high in solid fats, added sugars, or salt.  Maintain a Healthy Weight Regular exercise can help you achieve or maintain a healthy weight. You should:  Do at least 150 minutes of exercise each week. The exercise should increase your heart rate and make you sweat (moderate-intensity exercise).  Do strength-training exercises at least twice a week. Watch Your Levels of Cholesterol and Blood Lipids  Have your blood tested for lipids and cholesterol every 5 years starting at 75 years of age. If you are at high risk for heart disease, you should start having your blood tested when you are 75 years old. You may need to have your cholesterol levels checked more often if: ? Your lipid or cholesterol levels are high. ? You are older than 75 years of age. ? You are at high risk for heart disease. What should I know about cancer screening? Many types of cancers can be detected early and may often be prevented. Lung Cancer  You should be screened every year for lung cancer if: ? You are a current smoker who has smoked for at least 30 years. ? You are a former smoker who has quit within the past 15 years.  Talk to your health care provider about your screening options, when you should start screening, and how often you should be screened. Colorectal Cancer  Routine colorectal cancer screening usually  begins at 75 years of age and should be repeated every 5-10 years until you are 75 years old. You may need to be screened more often if early forms of precancerous polyps or small growths are found. Your health care provider may recommend screening at an earlier age if you have risk factors for colon cancer.  Your health care provider may recommend using home test kits to check for hidden blood in the stool.  A small camera at the end of a tube can be used to examine your colon (sigmoidoscopy or colonoscopy). This checks for the earliest forms of colorectal cancer. Prostate and Testicular Cancer  Depending on your age and overall health, your health care provider may do certain tests to screen for prostate and testicular cancer.  Talk to your health care provider about any symptoms or concerns you have about testicular or prostate cancer. Skin Cancer  Check your skin from head to toe regularly.  Tell your health care provider about any new moles or changes in moles, especially if: ? There is a change in a mole's size, shape, or color. ? You have a mole that is larger than a pencil eraser.  Always use sunscreen. Apply sunscreen liberally and repeat throughout the day.  Protect yourself by wearing long sleeves, pants, a wide-brimmed hat, and sunglasses when outside. What should I know about heart disease, diabetes, and high blood pressure?  If you are 18-39 years of age, have your blood pressure checked every 3-5 years. If you are 40   years of age or older, have your blood pressure checked every year. You should have your blood pressure measured twice-once when you are at a hospital or clinic, and once when you are not at a hospital or clinic. Record the average of the two measurements. To check your blood pressure when you are not at a hospital or clinic, you can use: ? An automated blood pressure machine at a pharmacy. ? A home blood pressure monitor.  Talk to your health care provider  about your target blood pressure.  If you are between 45-79 years old, ask your health care provider if you should take aspirin to prevent heart disease.  Have regular diabetes screenings by checking your fasting blood sugar level. ? If you are at a normal weight and have a low risk for diabetes, have this test once every three years after the age of 45. ? If you are overweight and have a high risk for diabetes, consider being tested at a younger age or more often.  A one-time screening for abdominal aortic aneurysm (AAA) by ultrasound is recommended for men aged 65-75 years who are current or former smokers. What should I know about preventing infection? Hepatitis B If you have a higher risk for hepatitis B, you should be screened for this virus. Talk with your health care provider to find out if you are at risk for hepatitis B infection. Hepatitis C Blood testing is recommended for:  Everyone born from 1945 through 1965.  Anyone with known risk factors for hepatitis C. Sexually Transmitted Diseases (STDs)  You should be screened each year for STDs including gonorrhea and chlamydia if: ? You are sexually active and are younger than 75 years of age. ? You are older than 75 years of age and your health care provider tells you that you are at risk for this type of infection. ? Your sexual activity has changed since you were last screened and you are at an increased risk for chlamydia or gonorrhea. Ask your health care provider if you are at risk.  Talk with your health care provider about whether you are at high risk of being infected with HIV. Your health care provider may recommend a prescription medicine to help prevent HIV infection. What else can I do?  Schedule regular health, dental, and eye exams.  Stay current with your vaccines (immunizations).  Do not use any tobacco products, such as cigarettes, chewing tobacco, and e-cigarettes. If you need help quitting, ask your health  care provider.  Limit alcohol intake to no more than 2 drinks per day. One drink equals 12 ounces of beer, 5 ounces of wine, or 1 ounces of hard liquor.  Do not use street drugs.  Do not share needles.  Ask your health care provider for help if you need support or information about quitting drugs.  Tell your health care provider if you often feel depressed.  Tell your health care provider if you have ever been abused or do not feel safe at home. This information is not intended to replace advice given to you by your health care provider. Make sure you discuss any questions you have with your health care provider. Document Released: 09/22/2007 Document Revised: 11/23/2015 Document Reviewed: 12/28/2014 Elsevier Interactive Patient Education  2019 Elsevier Inc.  

## 2018-03-27 NOTE — Progress Notes (Signed)
Please call patient: I have reviewed his/her lab results. Please let him know everything looks good; sugar is a little elevated: was he fasting? If so we will monitor this over time; he should limit sweets, sweetened beverages and highly processed carbohydrates to prevent this from worsening. If he was not fasting, then it is ok.

## 2018-04-04 ENCOUNTER — Other Ambulatory Visit: Payer: Self-pay | Admitting: Neurology

## 2018-04-23 ENCOUNTER — Ambulatory Visit: Payer: PPO | Admitting: Internal Medicine

## 2018-04-23 ENCOUNTER — Encounter: Payer: Self-pay | Admitting: Internal Medicine

## 2018-04-23 VITALS — BP 138/76 | HR 61 | Ht 69.5 in | Wt 188.6 lb

## 2018-04-23 DIAGNOSIS — G4733 Obstructive sleep apnea (adult) (pediatric): Secondary | ICD-10-CM

## 2018-04-23 DIAGNOSIS — F5101 Primary insomnia: Secondary | ICD-10-CM | POA: Diagnosis not present

## 2018-04-23 DIAGNOSIS — G4700 Insomnia, unspecified: Secondary | ICD-10-CM | POA: Insufficient documentation

## 2018-04-23 NOTE — Assessment & Plan Note (Signed)
He describes increased trouble maintaining sleep.  If he wakes because of his neuropathy/arthralgias, or bathroom, then he tends to stay awake for quite a while.  TV stays on in the room-discussed.  Gabapentin for neuropathy is likely increasing daytime hypersomnia as discussed. Plan-sleep hygiene reviewed.  Suggest OTC sleep aid as needed to help at night and occasional use of a caffeine tablet in the daytime.  Let us know if these do not help.

## 2018-04-23 NOTE — Assessment & Plan Note (Signed)
He continues to benefit from CPAP and download confirms good compliance and control. Plan-replace old machine, changing to AutoPap 5-15.

## 2018-04-23 NOTE — Progress Notes (Signed)
Subjective:    Patient ID: Stephen Anderson, male    DOB: 1941-02-04, 78 y.o.   MRN: [id number]  HPI male former smoker  followed for OSA complicated by peripheral neuropathy, essential tremor NPSG 11/2013  AHI 21/ hr, titrated to 8 cwp  -----------------------------------------------------------  04/23/17-  78 year old male former smoker followed for OSA, complicated by peripheral neuropathy, essential tremor CPAP 12/Advanced ----OSA; DME: [facility]. Pt wears CPAP nightly and DL attached. Pressure works well for patient. No new supplies needed at this time.  Download 90% compliance, AHI 1.1/hour He sleeps and breathes much more comfortably using CPAP and has a good seal with his full facemask despite his beard.  Notices occasional dry mouth in the morning with no real pattern identified. Bothered by peripheral neuropathy still.  04/23/2018- male former smoker followed for OSA, complicated by peripheral neuropathy, essential tremor, GERD, CPAP 12/Advanced -----OSA: DME: [facility] Pt wears CPAP nightly and states he would like to replace old worn out machine.  Download 87% compliance AHI 1.4/hour He is sleeping less well-sleep disturbed by his peripheral neuropathy which is treated with gabapentin, and arthritis.  Gabapentin may come contribute to increased daytime drowsiness.  He has had a few concerns about alertness while driving-safety responsibility emphasized. CPAP is mechanically working well but machine is old and I will take the opportunity to change to AutoPap with replacement.  ROS-see HPI    "+" = positive Constitutional:    weight loss, night sweats, fevers, chills, fatigue, lassitude. HEENT:    headaches, difficulty swallowing, tooth/dental problems, sore throat,       sneezing, itching, ear ache, nasal congestion, post nasal drip, snoring CV:    chest pain, orthopnea, PND, swelling in lower extremities, anasarca,                                                dizziness,  palpitations Resp:   shortness of breath with exertion or at rest.                productive cough,   non-productive cough, coughing up of blood.              change in color of mucus.  wheezing.   Skin:    rash or lesions. GI:  No-   heartburn, indigestion, abdominal pain, nausea, vomiting, diarrhea,                 change in bowel habits, loss of appetite GU: dysuria, change in color of urine, no urgency or frequency.   flank pain. MS:   joint pain, stiffness, decreased range of motion, back pain. Neuro-    + symptoms of peripheral neuropathy both lower legs. Psych:  change in mood or affect.  depression or anxiety.   memory loss.    Objective:  OBJ- Physical Exam   General- Alert, Oriented, Affect-appropriate, Distress- none acute, trim Skin- rash-none, lesions- none, excoriation- none Lymphadenopathy- none Head- atraumatic            Eyes- Gross vision intact, PERRLA, conjunctivae and secretions clear            Ears- Hearing, canals-normal            Nose- Clear, no-Septal dev, mucus, polyps, erosion, perforation             Throat- Mallampati II , mucosa clear ,  drainage- none, tonsils- atrophic Neck- flexible , trachea midline, no stridor , thyroid nl, carotid no bruit Chest - symmetrical excursion , unlabored           Heart/CV- RRR , no murmur , no gallop  , no rub, nl s1 s2                           - JVD- none , edema- none, stasis changes- none, varices- none           Lung- clear to P&A, wheeze- none, cough- none , dullness-none, rub- none           Chest wall-  Abd-  Br/ Gen/ Rectal- Not done, not indicated Extrem- cyanosis- none, clubbing, none, atrophy- none, strength- nl Neuro- grossly intact to observation  Assessment & Plan:

## 2018-04-23 NOTE — Patient Instructions (Addendum)
Order- DME [facility]- please replace old CPAP machine change to auto 5-15, mask of choice, humidifier, supplies, Airview or card  Suggest trying otc Zzquil as a sleep aid at bedtime if needed  Suggest trying otc caffeine tabs NoDoz, Vivarin) occasionally in the daytime if needed for alertness.

## 2018-04-26 ENCOUNTER — Other Ambulatory Visit: Payer: Self-pay | Admitting: Family Medicine

## 2018-04-28 ENCOUNTER — Telehealth: Payer: Self-pay | Admitting: Internal Medicine

## 2018-04-28 NOTE — Telephone Encounter (Signed)
I faxed CPAP titration & sleep study to [facility] as requested.  Sent CM to [place] to let him know.  Rec'd fax confirm.

## 2018-05-08 ENCOUNTER — Encounter: Payer: Self-pay | Admitting: Family Medicine

## 2018-05-08 ENCOUNTER — Other Ambulatory Visit: Payer: Self-pay

## 2018-05-08 ENCOUNTER — Ambulatory Visit (INDEPENDENT_AMBULATORY_CARE_PROVIDER_SITE_OTHER): Payer: PPO | Admitting: Family Medicine

## 2018-05-08 ENCOUNTER — Ambulatory Visit: Payer: PPO | Admitting: Family Medicine

## 2018-05-08 VITALS — BP 130/82 | HR 62 | Temp 97.8°F | Resp 16 | Ht 70.0 in | Wt 190.0 lb

## 2018-05-08 DIAGNOSIS — R7301 Impaired fasting glucose: Secondary | ICD-10-CM | POA: Diagnosis not present

## 2018-05-08 DIAGNOSIS — L570 Actinic keratosis: Secondary | ICD-10-CM

## 2018-05-08 DIAGNOSIS — L219 Seborrheic dermatitis, unspecified: Secondary | ICD-10-CM | POA: Diagnosis not present

## 2018-05-08 NOTE — Patient Instructions (Addendum)
Please schedule an appointment with [facility] for treatment.  Please schedule a follow up visit in 6 months to recheck sugars.  Eat a healthy diet and avoid sweets.  Seborrheic Dermatitis, Adult Seborrheic dermatitis is a skin disease that causes red, scaly patches. It usually occurs on the scalp, and it is often called dandruff. The patches may appear on other parts of the body. Skin patches tend to appear where there are many oil glands in the skin. Areas of the body that are commonly affected include:  Scalp.  Skin folds of the body.  Ears.  Eyebrows.  Neck.  Face.  Armpits.  The bearded area of men's faces. The condition may come and go for no known reason, and it is often long-lasting (chronic). What are the causes? The cause of this condition is not known. What increases the risk? This condition is more likely to develop in people who:  Have certain conditions, such as: ? HIV (human immunodeficiency virus). ? AIDS (acquired immunodeficiency syndrome). ? Parkinson disease. ? Mood disorders, such as depression.  Are 110-96 years old. What are the signs or symptoms? Symptoms of this condition include:  Thick scales on the scalp.  Redness on the face or in the armpits.  Skin that is flaky. The flakes may be white or yellow.  Skin that seems oily or dry but is not helped with moisturizers.  Itching or burning in the affected areas. How is this diagnosed? This condition is diagnosed with a medical history and physical exam. A sample of your skin may be tested (skin biopsy). You may need to see a skin specialist (dermatologist). How is this treated? There is no cure for this condition, but treatment can help to manage the symptoms. You may get treatment to remove scales, lower the risk of skin infection, and reduce swelling or itching. Treatment may include:  Creams that reduce swelling and irritation (steroids).  Creams that reduce skin  yeast.  Medicated shampoo, soaps, moisturizing creams, or ointments.  Medicated moisturizing creams or ointments. Follow these instructions at home:  Apply over-the-counter and prescription medicines only as told by your health care provider.  Use any medicated shampoo, soaps, skin creams, or ointments only as told by your health care provider.  Keep all follow-up visits as told by your health care provider. This is important. Contact a health care provider if:  Your symptoms do not improve with treatment.  Your symptoms get worse.  You have new symptoms. This information is not intended to replace advice given to you by your health care provider. Make sure you discuss any questions you have with your health care provider. Document Released: 03/26/2005 Document Revised: 10/14/2015 Document Reviewed: 07/14/2015 Elsevier Interactive Patient Education  2019 [employer].

## 2018-05-08 NOTE — Progress Notes (Signed)
Subjective  CC:  Chief Complaint  Patient presents with  . Scalp Irritation    He has been using DHS products and reports minimal improvement    HPI: Stephen Anderson is a 78 y.o. male who presents to the office today to address the problems listed above in the chief complaint.  F/u AK and seb derm: s/p cryotherapy of 2 lesions on cheek: still present and larger. Also with worsening seb derm sxs and redness on face. Has seen K. Sheffield in past.  Ifg: on labs from [facility]. Pt reports was eating a lot of desserts due to the holidays. No sxs of hyperglycemia.  Fasting glucose was 107.  Lab Results  Component Value Date   CHOL 159 03/26/2018   HDL 40.00 03/26/2018   LDLCALC 85 03/26/2018   TRIG 171.0 (H) 03/26/2018   CHOLHDL 4 03/26/2018      Assessment  1. Actinic keratosis of left cheek   2. Actinic keratosis of right cheek   3. Seborrheic dermatitis   4. Impaired fasting glucose      Plan   Rec f/u with derm: has seb derm, rosacea and AKs  Reviewed labs from [facility] in dec: discussed decreasing sugars and carbs in diet. Will recheck in 6 months.   Follow up: Return if symptoms worsen or fail to improve.  Visit date not found  No orders of the defined types were placed in this encounter.  No orders of the defined types were placed in this encounter.     I reviewed the patients updated PMH, FH, and SocHx.    Patient Active Problem List   Diagnosis Date Noted  . 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 History of melanoma 03/26/2018  . GERD (gastroesophageal reflux disease) 02/24/2018  . Mixed hyperlipidemia 02/24/2018  . 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 Obstructive sleep apnea 12/30/2013  . Essential tremor 05/16/2012  . Peripheral neuropathy 05/16/2012   Current Meds  Medication Sig  . Acetaminophen (TYLENOL ARTHRITIS EXT RELIEF PO) Take 1 tablet by mouth every 8 (eight) hours.  Marland Kitchen aspirin 81 MG tablet  Take 81 mg by mouth every other day.   Marland Kitchen atorvastatin (LIPITOR) 40 MG tablet Take 40 mg by mouth daily.  . caffeine (STAY AWAKE) 200 MG TABS tablet Take 200 mg by mouth daily. 1/2 every morning  . clobetasol (TEMOVATE) 0.05 % external solution Apply 1 application topically 2 (two) times daily.  . diclofenac sodium (VOLTAREN) 1 % GEL diclofenac 1 % topical gel  . doxylamine, Sleep, (SLEEP AID) 25 MG tablet Take 25 mg by mouth at bedtime as needed. Takes 1 tab nightly  . gabapentin (NEURONTIN) 300 MG capsule TAKE ONE CAPSULE BY MOUTH THREE TIMES DAILY  . NON FORMULARY CPAP  . pantoprazole (PROTONIX) 40 MG tablet TAKE ONE TABLET BY MOUTH TWICE DAILY  . primidone (MYSOLINE) 50 MG tablet TAKE ONE TABLET BY MOUTH EVERY MORNING AND ONE TABLET IN THE EVENING    Allergies: Patient is allergic to oxycontin [oxycodone hcl]. Family History: Patient family history includes Bladder Cancer in his brother; Heart disease in his brother; Lung cancer in his father. Social History:  Patient  reports that he quit smoking about 10 years ago. His smoking use included cigarettes. He has a 50.00 pack-year smoking history. He quit smokeless tobacco use about 15 years ago. He reports that he does not drink alcohol or use drugs.  Review of Systems: Constitutional: Negative for fever malaise  or anorexia Cardiovascular: negative for chest pain Respiratory: negative for SOB or persistent cough Gastrointestinal: negative for abdominal pain  Objective  Vitals: BP 130/82   Pulse 62   Temp 97.8 F (36.6 C) (Oral)   Resp 16   Ht 5\' 10"  (1.778 m)   Wt 190 lb (86.2 kg)   BMI 27.26 kg/m  General: no acute distress , A&Ox3 Skin:  Warm, erythematous cheeks bilaterally with thickened keratotic lesions on cheeks and flaking at scalp line    Commons side effects, risks, benefits, and alternatives for medications and treatment plan prescribed today were discussed, and the patient expressed understanding of the given  instructions. Patient is instructed to call or message via MyChart if he/she has any questions or concerns regarding our treatment plan. No barriers to understanding were identified. We discussed Red Flag symptoms and signs in detail. Patient expressed understanding regarding what to do in case of urgent or emergency type symptoms.   Medication list was reconciled, printed and provided to the patient in AVS. Patient instructions and summary information was reviewed with the patient as documented in the AVS. This note was prepared with assistance of Dragon voice recognition software. Occasional wrong-word or sound-a-like substitutions may have occurred due to the inherent limitations of voice recognition software

## 2018-05-20 ENCOUNTER — Other Ambulatory Visit: Payer: Self-pay | Admitting: Physician Assistant

## 2018-05-20 DIAGNOSIS — D485 Neoplasm of uncertain behavior of skin: Secondary | ICD-10-CM | POA: Diagnosis not present

## 2018-05-20 DIAGNOSIS — D0439 Carcinoma in situ of skin of other parts of face: Secondary | ICD-10-CM | POA: Diagnosis not present

## 2018-05-20 DIAGNOSIS — C44320 Squamous cell carcinoma of skin of unspecified parts of face: Secondary | ICD-10-CM

## 2018-05-20 DIAGNOSIS — L570 Actinic keratosis: Secondary | ICD-10-CM | POA: Diagnosis not present

## 2018-05-20 HISTORY — DX: Squamous cell carcinoma of skin of unspecified parts of face: C44.320

## 2018-06-12 DIAGNOSIS — D0439 Carcinoma in situ of skin of other parts of face: Secondary | ICD-10-CM | POA: Diagnosis not present

## 2018-07-23 ENCOUNTER — Other Ambulatory Visit: Payer: Self-pay | Admitting: Family Medicine

## 2018-07-24 ENCOUNTER — Other Ambulatory Visit: Payer: Self-pay | Admitting: Family Medicine

## 2018-08-25 ENCOUNTER — Ambulatory Visit: Payer: Self-pay | Admitting: *Deleted

## 2018-08-25 NOTE — Telephone Encounter (Signed)
FYI

## 2018-08-25 NOTE — Telephone Encounter (Signed)
TC from Mrs. Mcmeen, the patient's wife. Stated he fell off a ladder and his left foot and ankle that is now swollen and bruised. This occurred Saturday. One of his wrist is sore-she is unsure of which. He is able to place weight on the foot but with tremendous pain.When asked of any numbness, she reported his neuropathy so could not say for sure.  Has a type of lace up brace on the ankle at this time. Has not iced nor elevated foot. Stated she would attempt to get that done with him. Took advil yesterday for the pain. Did not hit his head/no CP/SOB. Spoke with [facility] and transferred for possible appointment.  Reason for Disposition . [1] Limp when walking AND [2] due to a direct blow or crushing injury  Answer Assessment - Initial Assessment Questions 1. MECHANISM: "How did the injury happen?" (e.g., twisting injury, direct blow)     Left ankle and foot. 2. ONSET: "When did the injury happen?" (Minutes or hours ago)      saturday 3. LOCATION: "Where is the injury located?"     Left foot and ankle and one of his wrist 4. APPEARANCE of INJURY: "What does the injury look like?"      Ankle is swollen and bruised 5. WEIGHT-BEARING: "Can you put weight on that foot?" "Can you walk (four steps or more)?"       Small amount of weight while he has on a lace up brace 6. SIZE: For cuts, bruises, or swelling, ask: "How large is it?" (e.g., inches or centimeters;  entire joint)      moderate 7. PAIN: "Is there pain?" If so, ask: "How bad is the pain?"    (e.g., Scale 1-10; or mild, moderate, severe)    Yes, hurts to walk but he won't say how bad.  8. TETANUS: For any breaks in the skin, ask: "When was the last tetanus booster?"     Yes, small scratch on his left shin. 9. OTHER SYMPTOMS: "Do you have any other symptoms?"      no 10. PREGNANCY: "Is there any chance you are pregnant?" "When was your last menstrual period?"       na  Protocols used: [place]

## 2018-08-25 NOTE — Telephone Encounter (Signed)
See note, Stephen Anderson scheduled for Thursday due to patient's schedule.

## 2018-08-28 ENCOUNTER — Ambulatory Visit (INDEPENDENT_AMBULATORY_CARE_PROVIDER_SITE_OTHER): Payer: PPO | Admitting: Family Medicine

## 2018-08-28 ENCOUNTER — Encounter: Payer: Self-pay | Admitting: Family Medicine

## 2018-08-28 ENCOUNTER — Other Ambulatory Visit: Payer: Self-pay

## 2018-08-28 ENCOUNTER — Other Ambulatory Visit: Payer: Self-pay | Admitting: Family Medicine

## 2018-08-28 ENCOUNTER — Ambulatory Visit (INDEPENDENT_AMBULATORY_CARE_PROVIDER_SITE_OTHER): Payer: PPO

## 2018-08-28 VITALS — BP 128/84 | HR 70 | Temp 98.2°F | Resp 16 | Ht 70.0 in | Wt 184.0 lb

## 2018-08-28 DIAGNOSIS — S99912A Unspecified injury of left ankle, initial encounter: Secondary | ICD-10-CM

## 2018-08-28 DIAGNOSIS — S93402A Sprain of unspecified ligament of left ankle, initial encounter: Secondary | ICD-10-CM

## 2018-08-28 DIAGNOSIS — S99922A Unspecified injury of left foot, initial encounter: Secondary | ICD-10-CM | POA: Diagnosis not present

## 2018-08-28 DIAGNOSIS — W11XXXA Fall on and from ladder, initial encounter: Secondary | ICD-10-CM

## 2018-08-28 DIAGNOSIS — M79672 Pain in left foot: Secondary | ICD-10-CM | POA: Diagnosis not present

## 2018-08-28 DIAGNOSIS — S9032XA Contusion of left foot, initial encounter: Secondary | ICD-10-CM

## 2018-08-28 DIAGNOSIS — M25572 Pain in left ankle and joints of left foot: Secondary | ICD-10-CM | POA: Diagnosis not present

## 2018-08-28 DIAGNOSIS — M7989 Other specified soft tissue disorders: Secondary | ICD-10-CM | POA: Diagnosis not present

## 2018-08-28 NOTE — Patient Instructions (Signed)
Please return in 2 weeks to recheck ankle/foot.  If you have any questions or concerns, please don't hesitate to send me a message via MyChart or call the office at [phone]. Thank you for visiting with Stephen Anderson today! It's our pleasure caring for you.   Ankle Sprain  An ankle sprain is a stretch or tear in a ligament in the ankle. Ligaments are tissues that connect bones to each other. The two most common types of ankle sprains are:  Inversion sprain. This happens when the foot turns inward and the ankle rolls outward. It affects the ligament on the outside of the foot (lateral ligament).  Eversion sprain. This happens when the foot turns outward and the ankle rolls inward. It affects the ligament on the inner side of the foot (medial ligament). What are the causes? This condition is often caused by accidentally rolling or twisting the ankle. What increases the risk? You are more likely to develop this condition if you play sports. What are the signs or symptoms? Symptoms of this condition include:  Pain in your ankle.  Swelling.  Bruising. This may develop right after you sprain your ankle or 1-2 days later.  Trouble standing or walking, especially when you turn or change directions. How is this diagnosed? This condition is diagnosed with:  A physical exam. During the exam, your health care provider will press on certain parts of your foot and ankle and try to move them in certain ways.  X-ray imaging. These may be taken to see how severe the sprain is and to check for broken bones. How is this treated? This condition may be treated with:  A brace or splint. This is used to keep the ankle from moving until it heals.  An elastic bandage. This is used to support the ankle.  Crutches.  Pain medicine.  Surgery. This may be needed if the sprain is severe.  Physical therapy. This may help to improve the range of motion in the ankle. Follow these instructions at home: If you  have a brace or a splint:  Wear the brace or splint as told by your health care provider. Remove it only as told by your health care provider.  Loosen the brace or splint if your toes tingle, become numb, or turn cold and blue.  Keep the brace or splint clean.  If the brace or splint is not waterproof: ? Do not let it get wet. ? Cover it with a watertight covering when you take a bath or a shower. If you have an elastic bandage (dressing):  Remove it to shower or bathe.  Try not to move your ankle much, but wiggle your toes from time to time. This helps to prevent swelling.  Adjust the dressing to make it more comfortable if it feels too tight.  Loosen the dressing if you have numbness or tingling in your foot, or if your foot becomes cold and blue. Managing pain, stiffness, and swelling   Take over-the-counter and prescription medicines only as told by your health care provider.  For 2-3 days, keep your ankle raised (elevated) above the level of your heart as much as possible.  If directed, put ice on the injured area: ? If you have a removable brace or splint, remove it as told by your health care provider. ? Put ice in a plastic bag. ? Place a towel between your skin and the bag. ? Leave the ice on for 20 minutes, 2-3 times a day. General instructions  Rest your ankle.  Do not use the injured limb to support your body weight until your health care provider says that you can. Use crutches as told by your health care provider.  Do not use any products that contain nicotine or tobacco, such as cigarettes, e-cigarettes, and chewing tobacco. If you need help quitting, ask your health care provider.  Keep all follow-up visits as told by your health care provider. This is important. Contact a health care provider if:  You have rapidly increasing bruising or swelling.  Your pain is not relieved with medicine. Get help right away if:  Your foot or toes become numb or blue.   You have severe pain that gets worse. Summary  An ankle sprain is a stretch or tear in a ligament in the ankle. Ligaments are tissues that connect bones to each other.  This condition is often caused by accidentally rolling or twisting the ankle.  Symptoms include pain, swelling, bruising, and trouble walking.  To relieve pain and swelling, put ice on the affected ankle, raise your ankle above the level of your heart, and use an elastic bandage.  Keep all follow-up visits as told by your health care provider. This is important. This information is not intended to replace advice given to you by your health care provider. Make sure you discuss any questions you have with your health care provider. Document Released: 03/26/2005 Document Revised: 12/17/2017 Document Reviewed: 08/20/2017 Elsevier Interactive Patient Education  2019 [employer].

## 2018-08-28 NOTE — Progress Notes (Signed)
Subjective  CC:  Chief Complaint  Patient presents with  . Ankle Pain    Left ankle, happened Sat 08/23/18.Marland Kitchen [place] off a ladder.. Swollen, bruised, and painful. He is wearing a ankle brace.    HPI: Stephen Anderson is a 78 y.o. male who presents to the office today to address the problems listed above in the chief complaint.  78 yo male was on an A-frame ladder pressure washing the gutters and frames of his home when ladder fell to the left: pt fell from approx 12 feet height and hit left ankle on ground inverting it. He was able to get up and finished the job. Later that evening, swelling and bruising of the ankle and foot was present. He since has done well but has pain with ambulation. He is able to bear weight. Has minimal right wrist pain as well w/o swelling or bruising. No other injuries.    Left ankle xray: negative Left foot xray: negative Assessment  1. Ankle injury, left, initial encounter   2. Fall from ladder, initial encounter   3. Sprain of left ankle, unspecified ligament, initial encounter   4. Contusion of left foot, initial encounter      Plan   Ankle sprain, contusions:  RICE, brace and time. Recheck in 2 weeks.   Follow up: Return in about 2 weeks (around 09/11/2018) for recheck ankle.  11/03/2018  Orders Placed This Encounter  Procedures  . DG Ankle Complete Left   No orders of the defined types were placed in this encounter.     I reviewed the patients updated PMH, FH, and SocHx.    Patient Active Problem List   Diagnosis Date Noted  . 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 History of melanoma 03/26/2018  . GERD (gastroesophageal reflux disease) 02/24/2018  . Mixed hyperlipidemia 02/24/2018  . 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 Obstructive sleep apnea 12/30/2013  . Essential tremor 05/16/2012  . Peripheral neuropathy 05/16/2012   Current Meds  Medication Sig  . Acetaminophen (TYLENOL  ARTHRITIS EXT RELIEF PO) Take 1 tablet by mouth every 8 (eight) hours.  Marland Kitchen aspirin 81 MG tablet Take 81 mg by mouth every other day.   Marland Kitchen atorvastatin (LIPITOR) 40 MG tablet Take 1 tablet (40 mg total) by mouth at bedtime.  . caffeine (STAY AWAKE) 200 MG TABS tablet Take 200 mg by mouth daily. 1/2 every morning  . clobetasol (TEMOVATE) 0.05 % external solution Apply 1 application topically 2 (two) times daily.  . diclofenac sodium (VOLTAREN) 1 % GEL diclofenac 1 % topical gel  . doxylamine, Sleep, (SLEEP AID) 25 MG tablet Take 25 mg by mouth at bedtime as needed. Takes 1 tab nightly  . gabapentin (NEURONTIN) 300 MG capsule TAKE ONE CAPSULE BY MOUTH THREE TIMES DAILY  . NON FORMULARY CPAP  . pantoprazole (PROTONIX) 40 MG tablet TAKE ONE TABLET BY MOUTH TWICE DAILY  . primidone (MYSOLINE) 50 MG tablet TAKE ONE TABLET BY MOUTH EVERY MORNING AND ONE TABLET IN THE EVENING    Allergies: Patient is allergic to oxycontin [oxycodone hcl]. Family History: Patient family history includes Bladder Cancer in his brother; Heart disease in his brother; Lung cancer in his father. Social History:  Patient  reports that he quit smoking about 10 years ago. His smoking use included cigarettes. He has a 50.00 pack-year smoking history. He quit smokeless tobacco use about 16 years ago. He reports that he does not drink alcohol or  use drugs.  Review of Systems: Constitutional: Negative for fever malaise or anorexia Cardiovascular: negative for chest pain Respiratory: negative for SOB or persistent cough Gastrointestinal: negative for abdominal pain  Objective  Vitals: BP 128/84   Pulse 70   Temp 98.2 F (36.8 C) (Oral)   Resp 16   Ht 5\' 10"  (1.778 m)   Wt 184 lb (83.5 kg)   SpO2 96%   BMI 26.40 kg/m  General: no acute distress , A&Ox3 Right wrist: nl exam. No swelling, bruising or ttp. From Left ankle: bilateral swelling with ttp over maleoli, and lateral ligaments with distal brusing. No metatarsal  ttp. Pain with inversion, decreased rom of ankle due to pain. Nl pulses, limping gait    Commons side effects, risks, benefits, and alternatives for medications and treatment plan prescribed today were discussed, and the patient expressed understanding of the given instructions. Patient is instructed to call or message via MyChart if he/she has any questions or concerns regarding our treatment plan. No barriers to understanding were identified. We discussed Red Flag symptoms and signs in detail. Patient expressed understanding regarding what to do in case of urgent or emergency type symptoms.   Medication list was reconciled, printed and provided to the patient in AVS. Patient instructions and summary information was reviewed with the patient as documented in the AVS. This note was prepared with assistance of Dragon voice recognition software. Occasional wrong-word or sound-a-like substitutions may have occurred due to the inherent limitations of voice recognition software

## 2018-09-09 DIAGNOSIS — D229 Melanocytic nevi, unspecified: Secondary | ICD-10-CM | POA: Diagnosis not present

## 2018-09-09 DIAGNOSIS — L570 Actinic keratosis: Secondary | ICD-10-CM | POA: Diagnosis not present

## 2018-09-10 ENCOUNTER — Encounter: Payer: Self-pay | Admitting: Family Medicine

## 2018-09-10 ENCOUNTER — Other Ambulatory Visit: Payer: Self-pay

## 2018-09-10 ENCOUNTER — Ambulatory Visit (INDEPENDENT_AMBULATORY_CARE_PROVIDER_SITE_OTHER): Payer: PPO | Admitting: Family Medicine

## 2018-09-10 VITALS — BP 138/85 | HR 68 | Temp 98.1°F | Ht 70.0 in | Wt 183.0 lb

## 2018-09-10 DIAGNOSIS — S93402A Sprain of unspecified ligament of left ankle, initial encounter: Secondary | ICD-10-CM

## 2018-09-10 DIAGNOSIS — D23112 Other benign neoplasm of skin of right lower eyelid, including canthus: Secondary | ICD-10-CM | POA: Diagnosis not present

## 2018-09-10 DIAGNOSIS — S9032XA Contusion of left foot, initial encounter: Secondary | ICD-10-CM

## 2018-09-10 NOTE — Patient Instructions (Addendum)
Please return in December 2020 for your annual complete physical; please come fasting.  See the handout for instructions regarding strengthening your ankle.  Do the exercises as instructed.  If you have any questions or concerns, please don't hesitate to send me a message via MyChart or call the office at [phone]. Thank you for visiting with [place] today! It's our pleasure caring for you.

## 2018-09-10 NOTE — Progress Notes (Signed)
   Subjective  CC:  Chief Complaint  Patient presents with  . Follow-up    HPI: Stephen Anderson is a 78 y.o. male who presents to the office today to address the problems listed above in the chief complaint.  F/u left ankle srpain: doing better. 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 Much less swollen. Still sore but not painful. Reviewed negative xray.   No pain meds. No concerns.   Assessment  1. Sprain of left ankle, unspecified ligament, initial encounter   2. Contusion of left foot, initial encounter      Plan   Sprain, left ankle:  Improving well. Start ankle rehab exercises from sports advisor. Wear brace while working until pain free.   Follow up: No follow-ups on file.  11/03/2018  No orders of the defined types were placed in this encounter.  No orders of the defined types were placed in this encounter.     I reviewed the patients updated PMH, FH, and SocHx.    Patient Active Problem List   Diagnosis Date Noted  . 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 History of melanoma 03/26/2018  . GERD (gastroesophageal reflux disease) 02/24/2018  . Mixed hyperlipidemia 02/24/2018  . 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 Obstructive sleep apnea 12/30/2013  . Essential tremor 05/16/2012  . Peripheral neuropathy 05/16/2012   No outpatient medications have been marked as taking for the 09/10/18 encounter (Office Visit) with Leamon Arnt, MD.    Allergies: Patient is allergic to oxycontin [oxycodone hcl]. Family History: Patient family history includes Bladder Cancer in his brother; Heart disease in his brother; Lung cancer in his father. Social History:  Patient  reports that he quit smoking about 10 years ago. His smoking use included cigarettes. He has a 50.00 pack-year smoking history. He quit smokeless tobacco use about 16 years ago. He reports that he does not drink alcohol or use drugs.  Review of  Systems: Constitutional: Negative for fever malaise or anorexia Cardiovascular: negative for chest pain Respiratory: negative for SOB or persistent cough Gastrointestinal: negative for abdominal pain  Objective  Vitals: There were no vitals taken for this visit. General: no acute distress , A&Ox3 Left ankle: minimal residual swelling. No ecchymosis, no bony ttp. Full ROM with mild pain with inversion. Normal gait     Commons side effects, risks, benefits, and alternatives for medications and treatment plan prescribed today were discussed, and the patient expressed understanding of the given instructions. Patient is instructed to call or message via MyChart if he/she has any questions or concerns regarding our treatment plan. No barriers to understanding were identified. We discussed Red Flag symptoms and signs in detail. Patient expressed understanding regarding what to do in case of urgent or emergency type symptoms.   Medication list was reconciled, printed and provided to the patient in AVS. Patient instructions and summary information was reviewed with the patient as documented in the AVS. This note was prepared with assistance of Dragon voice recognition software. Occasional wrong-word or sound-a-like substitutions may have occurred due to the inherent limitations of voice recognition software

## 2018-09-11 ENCOUNTER — Ambulatory Visit: Payer: PPO | Admitting: Family Medicine

## 2018-09-22 ENCOUNTER — Other Ambulatory Visit: Payer: Self-pay | Admitting: Family Medicine

## 2018-11-03 ENCOUNTER — Ambulatory Visit: Payer: PPO | Admitting: Family Medicine

## 2018-12-24 ENCOUNTER — Other Ambulatory Visit: Payer: Self-pay | Admitting: Neurology

## 2018-12-24 NOTE — Telephone Encounter (Addendum)
Requested Prescriptions   Pending Prescriptions Disp Refills  . gabapentin (NEURONTIN) 300 MG capsule [Pharmacy Med Name: gabapentin 300 mg capsule] 270 capsule 1    Sig: TAKE ONE CAPSULE BY MOUTH THREE TIMES DAILY   Rx last filled:04/04/18 #270 1 refills  Pt last seen: 12/05/17  Follow up appt scheduled: NONE  PT NEEDS APPT

## 2019-01-08 ENCOUNTER — Telehealth: Payer: Self-pay | Admitting: Internal Medicine

## 2019-01-08 DIAGNOSIS — G4733 Obstructive sleep apnea (adult) (pediatric): Secondary | ICD-10-CM

## 2019-01-08 NOTE — Telephone Encounter (Signed)
Call returned to patient, he reports CY told him in august that he was due for a new cpap machine so he would like to go ahead and order.   CY please advise if we can place order for new cpap. Thanks. (order has been pended)

## 2019-01-08 NOTE — Telephone Encounter (Signed)
Order- DME [facility]- please replace old CPAP machine, change to auto 5-15, mask of choice, humidifier, supplies, AirView/ card

## 2019-01-08 NOTE — Telephone Encounter (Signed)
Called and spoke w/ pt regarding CY's recommendations below. Pt verbalized understanding and agreed to these measures. Order for new CPAP start has been placed to [facility]. Nothing further needed at this time.

## 2019-01-14 ENCOUNTER — Other Ambulatory Visit: Payer: Self-pay

## 2019-01-14 ENCOUNTER — Encounter: Payer: Self-pay | Admitting: Family Medicine

## 2019-01-14 ENCOUNTER — Ambulatory Visit (INDEPENDENT_AMBULATORY_CARE_PROVIDER_SITE_OTHER): Payer: PPO

## 2019-01-14 ENCOUNTER — Ambulatory Visit (INDEPENDENT_AMBULATORY_CARE_PROVIDER_SITE_OTHER): Payer: PPO | Admitting: Family Medicine

## 2019-01-14 VITALS — BP 136/80 | HR 50 | Temp 98.3°F | Resp 16 | Ht 70.0 in | Wt 184.6 lb

## 2019-01-14 DIAGNOSIS — R0989 Other specified symptoms and signs involving the circulatory and respiratory systems: Secondary | ICD-10-CM | POA: Diagnosis not present

## 2019-01-14 DIAGNOSIS — J301 Allergic rhinitis due to pollen: Secondary | ICD-10-CM | POA: Diagnosis not present

## 2019-01-14 DIAGNOSIS — R001 Bradycardia, unspecified: Secondary | ICD-10-CM | POA: Diagnosis not present

## 2019-01-14 DIAGNOSIS — Z87891 Personal history of nicotine dependence: Secondary | ICD-10-CM

## 2019-01-14 DIAGNOSIS — J3489 Other specified disorders of nose and nasal sinuses: Secondary | ICD-10-CM | POA: Diagnosis not present

## 2019-01-14 DIAGNOSIS — Z23 Encounter for immunization: Secondary | ICD-10-CM | POA: Diagnosis not present

## 2019-01-14 MED ORDER — FLUTICASONE PROPIONATE 50 MCG/ACT NA SUSP: 1.0000 | Freq: Every day | NASAL | 6 refills | Status: DC

## 2019-01-14 MED ORDER — CETIRIZINE HCL 10 MG PO TABS: 10.0000 mg | ORAL_TABLET | Freq: Every day | ORAL | 11 refills | Status: DC

## 2019-01-14 MED ORDER — MUPIROCIN 2 % EX OINT: 1.0000 "application " | TOPICAL_OINTMENT | Freq: Two times a day (BID) | CUTANEOUS | 0 refills | Status: AC

## 2019-01-14 NOTE — Patient Instructions (Addendum)
Please return if symptoms do not improve over the next 2-4 weeks.  Return IF you develop shortness of breath or chest pain.  Start both allergy medications: the zyrtec is over the counter. The flonase is a prescription.   Today you were given your flu vaccination.    If you have any questions or concerns, please don't hesitate to send me a message via MyChart or call the office at [phone]. Thank you for visiting with [place] today! It's our pleasure caring for you.

## 2019-01-14 NOTE — Progress Notes (Signed)
Subjective  CC:  Chief Complaint  Patient presents with  . Nose Lesion    Right side, has had a few months without going away.. Has used a disinfectent   . Chest congestion    He reports that he uses CPAP, has been having chest congestion that causes him to have difficulty with swallowing pills. He states his CPAP machine is not self cleaning, he cleans himself. Has family HX of lung cancer    HPI: Stephen Anderson is a 78 y.o. male who presents to the office today to address the problems listed above in the chief complaint.  Patient comes in due to a small sore on the inside of his right nostril that has been there for a number of months.  Minimally sore.  No significant drainage.  No redness or swelling on the outside of the nose.  Complains of daily nasal congestion, PND, having to clear his throat in the morning when he awakens and sometimes coughing up clear phlegm.  He admits to on occasion feeling some chest congestion and a deeper cough will clear it.  He has no history of COPD but he has been a former smoker.  No hemoptysis or shortness of breath.  No chest tightness, palpitations, near syncope, lightheadedness or substernal chest pain.  He is very active.  Tends to walk and jog intermittently.  He denies exertional symptoms.  Denies symptoms of infection including fever, chills, sore throat, sinus pain.  No known Covid exposures.  He does have obstructive sleep apnea and uses CPAP machine.  He feels that is working well.  His dad had lung cancer and this does worry him.  Patient carries a history of melanoma.  He takes all of his pills together in 1 gallop with 1 small sip of water.  Noticing that at times he has to take more water to get all of the smaller pills down.  Denies food dysphagia, water dysphagia, or diet aphasia.  No GERD symptoms Assessment  1. Seasonal allergic rhinitis due to pollen   2. Chest congestion   3. Former smoker   4. Nasal vestibulitis      Plan    Allergies: We will treat for seasonal allergic rhinitis and postnasal drainage with Zyrtec and Flonase.  Symptoms should improve over the next 2 to 4 weeks.  Check chest x-ray to make sure that lungs look clear.  If symptoms are improving he will follow up further.  Bradycardia today noted: Asymptomatic.  Typically runs in the low 60s.  Will monitor.  Patient told to watch for symptoms of hypotension or fatigue.  He is due back for his physical in December, can check EKG at that time.  Patient is warned of red flag symptoms including chest pain chest tightness or exertional symptoms.  He will schedule appointment if he develops any problems.  Nasal infection start Bactroban twice daily.  No masses noted.  Flu shot today after counseling and education given.  Patient accepts.  Follow up: Follow-up if symptoms do not improve over the next 2 to 4 weeks 04/01/2019  Orders Placed This Encounter  Procedures  . DG Chest 2 View   Meds ordered this encounter  Medications  . fluticasone (FLONASE) 50 MCG/ACT nasal spray    Sig: Place 1 spray into both nostrils daily.    Dispense:  16 g    Refill:  6  . cetirizine (ZYRTEC) 10 MG tablet    Sig: Take 1 tablet (10 mg total)  by mouth daily.    Dispense:  30 tablet    Refill:  11  . mupirocin ointment (BACTROBAN) 2 %    Sig: Place 1 application into the nose 2 (two) times daily for 7 days.    Dispense:  22 g    Refill:  0      I reviewed the patients updated PMH, FH, and SocHx.    Patient Active Problem List   Diagnosis Date Noted  . 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 History of melanoma 03/26/2018  . GERD (gastroesophageal reflux disease) 02/24/2018  . Mixed hyperlipidemia 02/24/2018  . 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 Obstructive sleep apnea 12/30/2013  . Essential tremor 05/16/2012  . Peripheral neuropathy 05/16/2012   Current Meds  Medication Sig  . Acetaminophen  (TYLENOL ARTHRITIS EXT RELIEF PO) Take 1 tablet by mouth every 8 (eight) hours.  Marland Kitchen aspirin 81 MG tablet Take 81 mg by mouth every other day.   Marland Kitchen atorvastatin (LIPITOR) 40 MG tablet Take 1 tablet (40 mg total) by mouth at bedtime.  . caffeine (STAY AWAKE) 200 MG TABS tablet Take 200 mg by mouth daily. 1/2 every morning  . diclofenac sodium (VOLTAREN) 1 % GEL apply sparingly to affected areas up to twice a day  . doxylamine, Sleep, (SLEEP AID) 25 MG tablet Take 25 mg by mouth at bedtime as needed. Takes 1 tab nightly  . gabapentin (NEURONTIN) 300 MG capsule TAKE ONE CAPSULE BY MOUTH THREE TIMES DAILY  . NON FORMULARY CPAP  . pantoprazole (PROTONIX) 40 MG tablet TAKE ONE TABLET BY MOUTH TWICE DAILY  . primidone (MYSOLINE) 50 MG tablet TAKE ONE TABLET BY MOUTH EVERY MORNING AND ONE TABLET IN THE EVENING    Allergies: Patient is allergic to oxycontin [oxycodone hcl]. Family History: Patient family history includes Bladder Cancer in his brother; Heart disease in his brother; Lung cancer in his father. Social History:  Patient  reports that he quit smoking about 10 years ago. His smoking use included cigarettes. He has a 50.00 pack-year smoking history. He quit smokeless tobacco use about 16 years ago. He reports that he does not drink alcohol or use drugs.  Review of Systems: Constitutional: Negative for fever malaise or anorexia Cardiovascular: negative for chest pain Respiratory: negative for SOB or persistent cough Gastrointestinal: negative for abdominal pain  Objective  Vitals: BP 136/80   Pulse (!) 50   Temp 98.3 F (36.8 C) (Tympanic)   Resp 16   Ht 5\' 10"  (1.778 m)   Wt 184 lb 9.6 oz (83.7 kg)   SpO2 99%   BMI 26.49 kg/m  General: no acute distress , A&Ox3 HEENT: PEERL, conjunctiva normal, right nasal vestibule with small pustule, no mass, clear rhinorrhea present, TMs normal bilaterally oropharynx moist,neck is supple Cardiovascular:  RRR without murmur or gallop.   Respiratory:  Good breath sounds bilaterally, CTAB with normal respiratory effort Skin:  Warm, no rashes     Commons side effects, risks, benefits, and alternatives for medications and treatment plan prescribed today were discussed, and the patient expressed understanding of the given instructions. Patient is instructed to call or message via MyChart if he/she has any questions or concerns regarding our treatment plan. No barriers to understanding were identified. We discussed Red Flag symptoms and signs in detail. Patient expressed understanding regarding what to do in case of urgent or emergency type symptoms.   Medication list was reconciled, printed and provided to the patient  in AVS. Patient instructions and summary information was reviewed with the patient as documented in the AVS. This note was prepared with assistance of Dragon voice recognition software. Occasional wrong-word or sound-a-like substitutions may have occurred due to the inherent limitations of voice recognition software

## 2019-01-14 NOTE — Progress Notes (Signed)
Please call patient: I have reviewed his/her lab results. Chest xray does not show any lung mass or acute infection or congestion. Looks ok.

## 2019-01-19 ENCOUNTER — Telehealth: Payer: Self-pay | Admitting: Internal Medicine

## 2019-01-19 DIAGNOSIS — G4733 Obstructive sleep apnea (adult) (pediatric): Secondary | ICD-10-CM

## 2019-01-19 NOTE — Telephone Encounter (Signed)
[facility] - can you help with this?

## 2019-01-19 NOTE — Telephone Encounter (Signed)
Pt returning call - please call back - CB# [phone]

## 2019-01-19 NOTE — Telephone Encounter (Signed)
ATC pt, line rings several times but then turns to a busy signal. Will try back.

## 2019-01-19 NOTE — Telephone Encounter (Signed)
Not sure of anything we can do all the auths for cpap goes through DME and sometimes there is a copay until machine is paid for Stephen Anderson

## 2019-01-19 NOTE — Telephone Encounter (Signed)
ATC- line rings and then turns busy

## 2019-01-20 NOTE — Telephone Encounter (Signed)
1) yes- if he wants, we can generate print script for on-line use- let me know if needed. 2) Please order DME [facility] to please service his CPAP machine and check humidifier leak.  If his machine otherwise works, a Facilities manager would be cheaper than a new machine.

## 2019-01-20 NOTE — Telephone Encounter (Signed)
Spoke with patient.  He states he wants to see how much the machines are running. He will then call us back. He does not want the machine to be fixed and then it breaks and this one he has in 78 years old already.  Patient will call back tomorrow and let us know his decision.

## 2019-01-20 NOTE — Telephone Encounter (Signed)
Pt returning call.  [phone].

## 2019-01-20 NOTE — Telephone Encounter (Signed)
Message routed to Dr. Annamaria Boots for review and recommendations.  Dr. Annamaria Boots, this patient was last seen by you 04/23/18 for OSA. The patient stated he had to wait until after September before getting another machine. Below is a cut/paste from the AVS:  "Assessment & Plan He describes increased trouble maintaining sleep.  If he wakes because of his neuropathy/arthralgias, or bathroom, then he tends to stay awake for quite a while.  TV stays on in the room-discussed.  Gabapentin for neuropathy is likely increasing daytime hypersomnia as discussed.  Plan-sleep hygiene reviewed.  Suggest OTC sleep aid as needed to help at night and occasional use of a caffeine tablet in the daytime.  Let us know if these do not help.  He continues to benefit from CPAP and download confirms good compliance and control. Plan-replace old machine, changing to AutoPap 5-15.  Order- DME [facility]- please replace old CPAP machine change to auto 5-15, mask of choice, humidifier, supplies, Airview or card  Suggest trying otc Zzquil as a sleep aid at bedtime if needed  Suggest trying otc caffeine tabs NoDoz, Vivarin) occasionally in the daytime if needed for alertness."   I called [facility], spoke with Melissa. She confirmed she was able to access the sleep study information. She stated based on the insurance to have the machine from DME he would have a rental charge and it would be rent to own. The cost for the machine would be $499.74 for the first month then $74.37 a month for 9 months.  I asked Melissa to confirm what the final cost would be after insurance has been applied as the patient is looking at paying for one out of pocket if the cost with [facility] is too high. Melissa stated she would get back to me and let me know. ____________  I called the patient and advised him of my call with [facility] and the anticipated return call regarding the final cost of machine. He stated the CPAP he has includes the humidifier which is leaking  into the machine.  Patient stated he is on limited income, only gets Fish farm manager. And if the cost is too high he will have to go back to not having a cpap machine at all. I suggested he look at the website cpap.com and see what the cost options are for the machines with humidifier.  If the patient ends up having to purchase out of pocket, are you willing to issue a prescription for a cpap with humidifier and supplies?

## 2019-01-20 NOTE — Telephone Encounter (Signed)
ATC pt, line rings several times but then turns to a busy signal. Will try back.

## 2019-01-21 NOTE — Telephone Encounter (Signed)
Melissa from [employer] called back and stated if the patient pays out of pocket the cost for a replacement machine would be approximately $1,200 + $200 for the humidifier. If he gets as a rental it is $74.81 per month. Insurance only covers a portion. And the patient would be responsible for 20% of the cost of supplies needed.  Based on the prior message noted below. The order was already placed. Nothing further needed at this time.

## 2019-01-21 NOTE — Telephone Encounter (Signed)
Called spoke with patient who reported that he did speak with insurance and was informed that a pre-authorization had to be obtained from the office in order to receive price information.  Advised patient that this is the normal protocol and we would gladly begin this process for him.  Order placed for replacement machine Nothing further needed; will sign off.

## 2019-01-21 NOTE — Telephone Encounter (Signed)
Pt would like to speak with a nurse about receiving his new cpap machine. Please advise. Can be reached at [phone]

## 2019-01-22 ENCOUNTER — Other Ambulatory Visit: Payer: Self-pay | Admitting: Family Medicine

## 2019-02-02 DIAGNOSIS — G4733 Obstructive sleep apnea (adult) (pediatric): Secondary | ICD-10-CM | POA: Diagnosis not present

## 2019-03-05 DIAGNOSIS — G4733 Obstructive sleep apnea (adult) (pediatric): Secondary | ICD-10-CM | POA: Diagnosis not present

## 2019-03-20 ENCOUNTER — Other Ambulatory Visit: Payer: Self-pay

## 2019-03-20 ENCOUNTER — Ambulatory Visit (INDEPENDENT_AMBULATORY_CARE_PROVIDER_SITE_OTHER): Payer: PPO

## 2019-03-20 VITALS — BP 136/74 | HR 52 | Temp 98.0°F | Ht 70.0 in | Wt 185.8 lb

## 2019-03-20 DIAGNOSIS — Z0000 Encounter for general adult medical examination without abnormal findings: Secondary | ICD-10-CM

## 2019-03-20 NOTE — Patient Instructions (Signed)
Stephen Anderson , Thank you for taking time to come for your Medicare Wellness Visit. I appreciate your ongoing commitment to your health goals. Please review the following plan we discussed and let me know if I can assist you in the future.   Screening recommendations/referrals: Colorectal Screening: up to date; last colonoscopy 10/19/16  Vision and Dental Exams: Recommended annual ophthalmology exams for early detection of glaucoma and other disorders of the eye Recommended annual dental exams for proper oral hygiene  Vaccinations: Influenza vaccine: completed 01/14/19 Pneumococcal vaccine: up to date; last 10/21/13 Tdap vaccine: up to date; last 07/10/10  Shingles vaccine: Shingrix completed   Advanced directives: Please bring a copy of your POA (Power of [place]) and/or Living Will to your next appointment.  Goals: Recommend to drink at least 6-8 8oz glasses of water per day and consume a balanced diet rich in fresh fruits and vegetables.   Next appointment: Please schedule your Annual Wellness Visit with your Nurse Health Advisor in one year.  Preventive Care 76 Years and Older, Male Preventive care refers to lifestyle choices and visits with your health care provider that can promote health and wellness. What does preventive care include?  A yearly physical exam. This is also called an annual well check.  Dental exams once or twice a year.  Routine eye exams. Ask your health care provider how often you should have your eyes checked.  Personal lifestyle choices, including:  Daily care of your teeth and gums.  Regular physical activity.  Eating a healthy diet.  Avoiding tobacco and drug use.  Limiting alcohol use.  Practicing safe sex.  Taking low doses of aspirin every day if recommended by your health care provider..  Taking vitamin and mineral supplements as recommended by your health care provider. What happens during an annual well check? The services and  screenings done by your health care provider during your annual well check will depend on your age, overall health, lifestyle risk factors, and family history of disease. Counseling  Your health care provider may ask you questions about your:  Alcohol use.  Tobacco use.  Drug use.  Emotional well-being.  Home and relationship well-being.  Sexual activity.  Eating habits.  History of falls.  Memory and ability to understand (cognition).  Work and work Statistician. Screening  You may have the following tests or measurements:  Height, weight, and BMI.  Blood pressure.  Lipid and cholesterol levels. These may be checked every 5 years, or more frequently if you are over 72 years old.  Skin check.  Lung cancer screening. You may have this screening every year starting at age 38 if you have a 30-pack-year history of smoking and currently smoke or have quit within the past 15 years.  Fecal occult blood test (FOBT) of the stool. You may have this test every year starting at age 40.  Flexible sigmoidoscopy or colonoscopy. You may have a sigmoidoscopy every 5 years or a colonoscopy every 10 years starting at age 47.  Prostate cancer screening. Recommendations will vary depending on your family history and other risks.  Hepatitis C blood test.  Hepatitis B blood test.  Sexually transmitted disease (STD) testing.  Diabetes screening. This is done by checking your blood sugar (glucose) after you have not eaten for a while (fasting). You may have this done every 1-3 years.  Abdominal aortic aneurysm (AAA) screening. You may need this if you are a current or former smoker.  Osteoporosis. You may be screened starting  at age 75 if you are at high risk. Talk with your health care provider about your test results, treatment options, and if necessary, the need for more tests. Vaccines  Your health care provider may recommend certain vaccines, such as:  Influenza vaccine. This is  recommended every year.  Tetanus, diphtheria, and acellular pertussis (Tdap, Td) vaccine. You may need a Td booster every 10 years.  Zoster vaccine. You may need this after age 43.  Pneumococcal 13-valent conjugate (PCV13) vaccine. One dose is recommended after age 71.  Pneumococcal polysaccharide (PPSV23) vaccine. One dose is recommended after age 52. Talk to your health care provider about which screenings and vaccines you need and how often you need them. This information is not intended to replace advice given to you by your health care provider. Make sure you discuss any questions you have with your health care provider. Document Released: 04/22/2015 Document Revised: 12/14/2015 Document Reviewed: 01/25/2015 Elsevier Interactive Patient Education  2017 [place] Prevention in the Home Falls can cause injuries. They can happen to people of all ages. There are many things you can do to make your home safe and to help prevent falls. What can I do on the outside of my home?  Regularly fix the edges of walkways and driveways and fix any cracks.  Remove anything that might make you trip as you walk through a door, such as a raised step or threshold.  Trim any bushes or trees on the path to your home.  Use bright outdoor lighting.  Clear any walking paths of anything that might make someone trip, such as rocks or tools.  Regularly check to see if handrails are loose or broken. Make sure that both sides of any steps have handrails.  Any raised decks and porches should have guardrails on the edges.  Have any leaves, snow, or ice cleared regularly.  Use sand or salt on walking paths during winter.  Clean up any spills in your garage right away. This includes oil or grease spills. What can I do in the bathroom?  Use night lights.  Install grab bars by the toilet and in the tub and shower. Do not use towel bars as grab bars.  Use non-skid mats or decals in the tub or  shower.  If you need to sit down in the shower, use a plastic, non-slip stool.  Keep the floor dry. Clean up any water that spills on the floor as soon as it happens.  Remove soap buildup in the tub or shower regularly.  Attach bath mats securely with double-sided non-slip rug tape.  Do not have throw rugs and other things on the floor that can make you trip. What can I do in the bedroom?  Use night lights.  Make sure that you have a light by your bed that is easy to reach.  Do not use any sheets or blankets that are too big for your bed. They should not hang down onto the floor.  Have a firm chair that has side arms. You can use this for support while you get dressed.  Do not have throw rugs and other things on the floor that can make you trip. What can I do in the kitchen?  Clean up any spills right away.  Avoid walking on wet floors.  Keep items that you use a lot in easy-to-reach places.  If you need to reach something above you, use a strong step stool that has a grab bar.  Keep electrical cords out of the way.  Do not use floor polish or wax that makes floors slippery. If you must use wax, use non-skid floor wax.  Do not have throw rugs and other things on the floor that can make you trip. What can I do with my stairs?  Do not leave any items on the stairs.  Make sure that there are handrails on both sides of the stairs and use them. Fix handrails that are broken or loose. Make sure that handrails are as long as the stairways.  Check any carpeting to make sure that it is firmly attached to the stairs. Fix any carpet that is loose or worn.  Avoid having throw rugs at the top or bottom of the stairs. If you do have throw rugs, attach them to the floor with carpet tape.  Make sure that you have a light switch at the top of the stairs and the bottom of the stairs. If you do not have them, ask someone to add them for you. What else can I do to help prevent  falls?  Wear shoes that:  Do not have high heels.  Have rubber bottoms.  Are comfortable and fit you well.  Are closed at the toe. Do not wear sandals.  If you use a stepladder:  Make sure that it is fully opened. Do not climb a closed stepladder.  Make sure that both sides of the stepladder are locked into place.  Ask someone to hold it for you, if possible.  Clearly mark and make sure that you can see:  Any grab bars or handrails.  First and last steps.  Where the edge of each step is.  Use tools that help you move around (mobility aids) if they are needed. These include:  Canes.  Walkers.  Scooters.  Crutches.  Turn on the lights when you go into a dark area. Replace any light bulbs as soon as they burn out.  Set up your furniture so you have a clear path. Avoid moving your furniture around.  If any of your floors are uneven, fix them.  If there are any pets around you, be aware of where they are.  Review your medicines with your doctor. Some medicines can make you feel dizzy. This can increase your chance of falling. Ask your doctor what other things that you can do to help prevent falls. This information is not intended to replace advice given to you by your health care provider. Make sure you discuss any questions you have with your health care provider. Document Released: 01/20/2009 Document Revised: 09/01/2015 Document Reviewed: 04/30/2014 Elsevier Interactive Patient Education  2017 [employer].

## 2019-03-20 NOTE — Progress Notes (Signed)
Subjective:   Stephen Anderson is a 78 y.o. male who presents for Medicare Annual/Subsequent preventive examination.  Review of Systems:   Cardiac Risk Factors include: advanced age (>61men, >51 women);male gender;dyslipidemia     Objective:    Vitals: BP 136/74   Pulse (!) 52   Temp 98 F (36.7 C)   Ht 5\' 10"  (1.778 m)   Wt 185 lb 12.8 oz (84.3 kg)   SpO2 90%   BMI 26.66 kg/m   Body mass index is 26.66 kg/m.  Advanced Directives 03/20/2019 03/26/2018 12/15/2013 11/30/2013  Does Patient Have a Medical Advance Directive? Yes Yes No No  Type of Advance Directive Living will;Healthcare Power of Attorney Living will;Healthcare Power of Attorney - -  Does patient want to make changes to medical advance directive? No - Patient declined - - -  Copy of [place] in Chart? No - copy requested No - copy requested - -  Would patient like information on creating a medical advance directive? - - No - patient declined information No - patient declined information    Tobacco Social History   Tobacco Use  Smoking Status Former Smoker  . Packs/day: 1.00  . Years: 50.00  . Pack years: 50.00  . Types: Cigarettes  . Quit date: 04/06/2008  . Years since quitting: 10.9  Smokeless Tobacco Former Systems developer  . Quit date: 05/16/2002     Counseling given: Not Answered   Clinical Intake:  Pre-visit preparation completed: Yes  Pain : No/denies pain  Diabetes: No  How often do you need to have someone help you when you read instructions, pamphlets, or other written materials from your doctor or pharmacy?: 1 - Never  Interpreter Needed?: No  Information entered by :: Denman George LPN  Past Medical History:  Diagnosis Date  . Essential tremor   . GERD (gastroesophageal reflux disease) 02/24/2018  . History of melanoma 03/26/2018   S/p excision on back  . Hypercholesteremia   . Mixed hyperlipidemia 02/24/2018  . Peripheral neuropathy    Past Surgical History:    Procedure Laterality Date  . CATARACT EXTRACTION, BILATERAL    . GALLBLADDER SURGERY    . HERNIA REPAIR     bilateral  . MELANOMA EXCISION     Family History  Problem Relation Age of Onset  . Lung cancer Father   . Bladder Cancer Brother   . Heart disease Brother    Social History   Socioeconomic History  . Marital status: Married    Spouse name: Not on file  . Number of children: y  . Years of education: Not on file  . Highest education level: Not on file  Occupational History  . Occupation: retired    Comment: Nurse, mental health order; painting  Tobacco Use  . Smoking status: Former Smoker    Packs/day: 1.00    Years: 50.00    Pack years: 50.00    Types: Cigarettes    Quit date: 04/06/2008    Years since quitting: 10.9  . Smokeless tobacco: Former Systems developer    Quit date: 05/16/2002  Substance and Sexual Activity  . Alcohol use: No    Alcohol/week: 0.0 standard drinks  . Drug use: No  . Sexual activity: Not on file  Other Topics Concern  . Not on file  Social History Narrative  . Not on file   Social Determinants of Health   Financial Resource Strain:   . Difficulty of Paying Living Expenses: Not on file  Food Insecurity:   . Worried About Charity fundraiser in the Last Year: Not on file  . Ran Out of Food in the Last Year: Not on file  Transportation Needs:   . Lack of Transportation (Medical): Not on file  . Lack of Transportation (Non-Medical): Not on file  Physical Activity:   . Days of Exercise per Week: Not on file  . Minutes of Exercise per Session: Not on file  Stress:   . Feeling of Stress : Not on file  Social Connections:   . Frequency of Communication with Friends and Family: Not on file  . Frequency of Social Gatherings with Friends and Family: Not on file  . Attends Religious Services: Not on file  . Active Member of Clubs or Organizations: Not on file  . Attends Archivist Meetings: Not on file  . Marital Status: Not on file     Outpatient Encounter Medications as of 03/20/2019  Medication Sig  . Acetaminophen (TYLENOL ARTHRITIS EXT RELIEF PO) Take 1 tablet by mouth every 8 (eight) hours.  Marland Kitchen aspirin 81 MG tablet Take 81 mg by mouth every other day.   Marland Kitchen atorvastatin (LIPITOR) 40 MG tablet Take 1 tablet (40 mg total) by mouth at bedtime.  . caffeine (STAY AWAKE) 200 MG TABS tablet Take 200 mg by mouth daily. 1/2 every morning  . cetirizine (ZYRTEC) 10 MG tablet Take 1 tablet (10 mg total) by mouth daily.  . diclofenac sodium (VOLTAREN) 1 % GEL apply sparingly to affected areas up to twice a day  . doxylamine, Sleep, (SLEEP AID) 25 MG tablet Take 25 mg by mouth at bedtime as needed. Takes 1 tab nightly  . fluticasone (FLONASE) 50 MCG/ACT nasal spray Place 1 spray into both nostrils daily.  Marland Kitchen gabapentin (NEURONTIN) 300 MG capsule TAKE ONE CAPSULE BY MOUTH THREE TIMES DAILY  . NON FORMULARY CPAP  . pantoprazole (PROTONIX) 40 MG tablet TAKE ONE TABLET BY MOUTH TWICE DAILY  . primidone (MYSOLINE) 50 MG tablet TAKE ONE TABLET BY MOUTH EVERY MORNING AND ONE TABLET IN THE EVENING   No facility-administered encounter medications on file as of 03/20/2019.    Activities of Daily Living In your present state of health, do you have any difficulty performing the following activities: 03/20/2019 03/26/2018  Hearing? N N  Vision? N N  Difficulty concentrating or making decisions? N N  Walking or climbing stairs? N N  Dressing or bathing? N N  Doing errands, shopping? N N  Preparing Food and eating ? N N  Using the Toilet? N N  In the past six months, have you accidently leaked urine? N N  Do you have problems with loss of bowel control? N N  Managing your Medications? N N  Managing your Finances? N N  Housekeeping or managing your Housekeeping? N N  Some recent data might be hidden    Patient Care Team: Leamon Arnt, MD as PCP - General (Family Medicine) Tat, Eustace Quail, DO as Consulting Physician  (Neurology) Deneise Lever, MD as Consulting Physician (Pulmonary Disease) Ronne Binning (Dentistry) Sydnee Cabal, MD as Consulting Physician (Orthopedic Surgery) Evelina Bucy, DPM as Consulting Physician (Podiatry) Ronald Lobo, MD as Consulting Physician (Gastroenterology) Luberta Mutter, MD as Consulting Physician (Ophthalmology) Luberta Mutter, MD as Consulting Physician (Ophthalmology) Lavonna Monarch, MD as Consulting Physician (Dermatology)   Assessment:   This is a routine wellness examination for Stephen Anderson.  Exercise Activities and Dietary recommendations Current Exercise Habits: Home exercise  routine, Time (Minutes): 30, Frequency (Times/Week): 3, Weekly Exercise (Minutes/Week): 90, Intensity: Mild, Exercise limited by: Other - see comments(yardwork activities)  Goals    . Increase physical activity     Increase activity by walking more.        Fall Risk Fall Risk  03/20/2019 03/26/2018 12/05/2017  Falls in the past year? 1 1 Yes  Comment - slipped off ladder -  Number falls in past yr: 0 0 2 or more  Injury with Fall? 1 0 No  Risk Factor Category  - - High Fall Risk  Risk for fall due to : History of fall(s) - -  Follow up Falls evaluation completed;Education provided;Falls prevention discussed Falls prevention discussed Falls evaluation completed   Is the patient's home free of loose throw rugs in walkways, pet beds, electrical cords, etc?   yes      Grab bars in the bathroom? yes      Handrails on the stairs?   yes      Adequate lighting?   yes  Timed Get Up and Go Performed: completed and within normal timeframe; no gait abnormalities noted   Depression Screen PHQ 2/9 Scores 03/20/2019 03/26/2018 02/24/2018  PHQ - 2 Score 0 0 0  PHQ- 9 Score - - 0    Cognitive Function- no cognitive concerns at this time  MMSE - Mini Mental State Exam 03/20/2019 03/26/2018  Orientation to time 5 5  Orientation to Place 5 5  Registration 3 3  Attention/  Calculation 5 5  Recall 3 0  Language- name 2 objects 2 2  Language- repeat 1 1  Language- follow 3 step command 3 3  Language- read & follow direction 1 1  Write a sentence 1 1  Copy design 1 1  Total score 30 27        Immunization History  Administered Date(s) Administered  . Fluad Quad(high Dose 65+) 01/14/2019  . Pneumococcal Conjugate-13 10/21/2013  . Pneumococcal Polysaccharide-23 07/17/2010  . Td 01/07/2009  . Tdap 07/10/2010  . Zoster 03/16/2017  . Zoster Recombinat (Shingrix) 04/03/2017, 06/17/2017    Qualifies for Shingles Vaccine? Shingrix completed   Screening Tests Health Maintenance  Topic Date Due  . TETANUS/TDAP  07/09/2020  . PNA vac Low Risk Adult  Completed  . INFLUENZA VACCINE  Discontinued   Cancer Screenings: Lung: Low Dose CT Chest recommended if Age 58-80 years, 30 pack-year currently smoking OR have quit w/in 15years. Patient does not qualify. Colorectal: colonoscopy 10/19/16 with Dr. Cristina Gong       Plan:  I have personally reviewed and addressed the Medicare Annual Wellness questionnaire and have noted the following in the patient's chart:  A. Medical and social history B. Use of alcohol, tobacco or illicit drugs  C. Current medications and supplements D. Functional ability and status E.  Nutritional status F.  Physical activity G. Advance directives H. List of other physicians I.  Hospitalizations, surgeries, and ER visits in previous 12 months J.  [place] such as hearing and vision if needed, cognitive and depression L. Referrals, records requested, and appointments- none   In addition, I have reviewed and discussed with patient certain preventive protocols, quality metrics, and best practice recommendations. A written personalized care plan for preventive services as well as general preventive health recommendations were provided to patient.   Signed,  Denman George, LPN  Nurse Health Advisor   Nurse Notes: no  additional

## 2019-04-01 ENCOUNTER — Encounter: Payer: PPO | Admitting: Family Medicine

## 2019-04-01 ENCOUNTER — Ambulatory Visit: Payer: PPO

## 2019-04-04 DIAGNOSIS — G4733 Obstructive sleep apnea (adult) (pediatric): Secondary | ICD-10-CM | POA: Diagnosis not present

## 2019-04-08 ENCOUNTER — Other Ambulatory Visit: Payer: Self-pay | Admitting: Family Medicine

## 2019-04-15 ENCOUNTER — Other Ambulatory Visit: Payer: Self-pay | Admitting: Family Medicine

## 2019-04-24 ENCOUNTER — Encounter: Payer: Self-pay | Admitting: Internal Medicine

## 2019-04-24 ENCOUNTER — Other Ambulatory Visit: Payer: Self-pay

## 2019-04-24 ENCOUNTER — Ambulatory Visit (INDEPENDENT_AMBULATORY_CARE_PROVIDER_SITE_OTHER): Payer: PPO | Admitting: Internal Medicine

## 2019-04-24 DIAGNOSIS — N138 Other obstructive and reflux uropathy: Secondary | ICD-10-CM | POA: Diagnosis not present

## 2019-04-24 DIAGNOSIS — N401 Enlarged prostate with lower urinary tract symptoms: Secondary | ICD-10-CM

## 2019-04-24 DIAGNOSIS — G4733 Obstructive sleep apnea (adult) (pediatric): Secondary | ICD-10-CM | POA: Diagnosis not present

## 2019-04-24 NOTE — Assessment & Plan Note (Signed)
Benefits from CPAP with improved sleep. Download confirms. Plan- continue auto 5-15

## 2019-04-24 NOTE — Patient Instructions (Signed)
We can continue CPAP auto 5-15, mask of choice, humidifier, supplies, AirView/ card  Please call if we can help

## 2019-04-24 NOTE — Progress Notes (Signed)
Subjective:    Patient ID: Stephen Anderson, male    DOB: 01/17/1941, 79 y.o.   MRN: [id number]  HPI male former smoker  followed for OSA complicated by peripheral neuropathy, essential tremor NPSG 11/2013  AHI 21/ hr, titrated to 8 cwp  -----------------------------------------------------------  04/23/2018- male former smoker followed for OSA, complicated by peripheral neuropathy, essential tremor, GERD, CPAP 12/Advanced -----OSA: DME: [facility] Pt wears CPAP nightly and states he would like to replace old worn out machine.  Download 87% compliance AHI 1.4/hour He is sleeping less well-sleep disturbed by his peripheral neuropathy which is treated with gabapentin, and arthritis.  Gabapentin may come contribute to increased daytime drowsiness.  He has had a few concerns about alertness while driving-safety responsibility emphasized. CPAP is mechanically working well but machine is old and I will take the opportunity to change to AutoPap with replacement.  04/24/19- 76 yomale former smoker followed for OSA, Insomnia,  complicated by peripheral neuropathy, essential tremor, GERD, BPH,  CPAP auto 5-15/Adapt Download compliance 100%, AHI 10.8/ hr Body weight today 186 lbs -----f/u OSA. Patient stated breathing is at his baseline.  Caffeine 200 mg tab, primidone, gabapentin, doxylamine for sleep Pleased with the way his replacement machine has been working. Sometimes takes it off in last hour of early morning to doze without it. Wife tells him then he snores and has apneas, so he understands. That may contribute to residual apneas. He describes hard work, Theatre manager.  Sleeps soundly at night x for nocturia. CXR 01/14/2019- Trachea is midline. Heart size normal. Lungs appear hyperinflated but clear. Biapical pleural thickening. No pleural fluid. IMPRESSION: Hyperinflation without acute finding.   ROS-see HPI    "+" = positive Constitutional:    weight loss, night sweats, fevers,  chills, fatigue, lassitude. HEENT:    headaches, difficulty swallowing, tooth/dental problems, sore throat,       sneezing, itching, ear ache, nasal congestion, post nasal drip, snoring CV:    chest pain, orthopnea, PND, swelling in lower extremities, anasarca,                                                dizziness, palpitations Resp:   shortness of breath with exertion or at rest.                productive cough,   non-productive cough, coughing up of blood.              change in color of mucus.  wheezing.   Skin:    rash or lesions. GI:  No-   heartburn, indigestion, abdominal pain, nausea, vomiting, diarrhea,                 change in bowel habits, loss of appetite GU: dysuria, change in color of urine, no urgency or frequency.   flank pain. MS:   joint pain, stiffness, decreased range of motion, back pain. Neuro-    + symptoms of peripheral neuropathy both lower legs. Psych:  change in mood or affect.  depression or anxiety.   memory loss.    Objective:  OBJ- Physical Exam   General- Alert, Oriented, Affect-appropriate, Distress- none acute, fit-appearing Skin- rash-none, lesions- none, excoriation- none Lymphadenopathy- none Head- atraumatic            Eyes- Gross vision intact, PERRLA, conjunctivae and secretions clear  Ears- Hearing, canals-normal            Nose- Clear, no-Septal dev, mucus, polyps, erosion, perforation             Throat- Mallampati II , mucosa clear , drainage- none, tonsils- atrophic Neck- flexible , trachea midline, no stridor , thyroid nl, carotid no bruit Chest - symmetrical excursion , unlabored           Heart/CV- RRR , no murmur , no gallop  , no rub, nl s1 s2                           - JVD- none , edema- none, stasis changes- none, varices- none           Lung- clear to P&A, wheeze- none, cough- none , dullness-none, rub- none           Chest wall-  Abd-  Br/ Gen/ Rectal- Not done, not indicated Extrem- cyanosis- none, clubbing, none,  atrophy- none, strength- nl Neuro- grossly intact to observation  Assessment & Plan:

## 2019-04-24 NOTE — Assessment & Plan Note (Signed)
Still has nocturia, but managing to work around his CPAP and sleeping better.  F/U Urology as needed

## 2019-05-04 ENCOUNTER — Ambulatory Visit: Payer: PPO | Attending: Internal Medicine

## 2019-05-04 DIAGNOSIS — Z23 Encounter for immunization: Secondary | ICD-10-CM | POA: Insufficient documentation

## 2019-05-04 NOTE — Progress Notes (Signed)
   Covid-19 Vaccination Clinic  Name:  Stephen Anderson    MRN: [id number] DOB: 1940-10-22  05/04/2019  Stephen Anderson was observed post Covid-19 immunization for 15 minutes without incidence. He was provided with Vaccine Information Sheet and instruction to access the V-Safe system.   Stephen Anderson was instructed to call 911 with any severe reactions post vaccine: Marland Kitchen Difficulty breathing  . Swelling of your face and throat  . A fast heartbeat  . A bad rash all over your body  . Dizziness and weakness    Immunizations Administered    Name Date Dose VIS Date Route   Pfizer COVID-19 Vaccine 05/04/2019  9:26 AM 0.3 mL 03/20/2019 Intramuscular   Manufacturer: [place]   Lot: [id number]   [place]: [id number]

## 2019-05-05 DIAGNOSIS — G4733 Obstructive sleep apnea (adult) (pediatric): Secondary | ICD-10-CM | POA: Diagnosis not present

## 2019-05-15 DIAGNOSIS — G4733 Obstructive sleep apnea (adult) (pediatric): Secondary | ICD-10-CM | POA: Diagnosis not present

## 2019-05-20 ENCOUNTER — Other Ambulatory Visit: Payer: Self-pay

## 2019-05-21 ENCOUNTER — Encounter: Payer: Self-pay | Admitting: Family Medicine

## 2019-05-21 ENCOUNTER — Ambulatory Visit (INDEPENDENT_AMBULATORY_CARE_PROVIDER_SITE_OTHER): Payer: PPO | Admitting: Family Medicine

## 2019-05-21 VITALS — BP 138/90 | HR 60 | Temp 97.6°F | Ht 70.0 in | Wt 185.0 lb

## 2019-05-21 DIAGNOSIS — N401 Enlarged prostate with lower urinary tract symptoms: Secondary | ICD-10-CM | POA: Diagnosis not present

## 2019-05-21 DIAGNOSIS — Z9989 Dependence on other enabling machines and devices: Secondary | ICD-10-CM | POA: Diagnosis not present

## 2019-05-21 DIAGNOSIS — N138 Other obstructive and reflux uropathy: Secondary | ICD-10-CM

## 2019-05-21 DIAGNOSIS — Z1212 Encounter for screening for malignant neoplasm of rectum: Secondary | ICD-10-CM

## 2019-05-21 DIAGNOSIS — G4733 Obstructive sleep apnea (adult) (pediatric): Secondary | ICD-10-CM | POA: Diagnosis not present

## 2019-05-21 DIAGNOSIS — G250 Essential tremor: Secondary | ICD-10-CM | POA: Diagnosis not present

## 2019-05-21 DIAGNOSIS — Z1211 Encounter for screening for malignant neoplasm of colon: Secondary | ICD-10-CM | POA: Diagnosis not present

## 2019-05-21 DIAGNOSIS — Z0000 Encounter for general adult medical examination without abnormal findings: Secondary | ICD-10-CM | POA: Diagnosis not present

## 2019-05-21 DIAGNOSIS — E782 Mixed hyperlipidemia: Secondary | ICD-10-CM | POA: Diagnosis not present

## 2019-05-21 DIAGNOSIS — M8949 Other hypertrophic osteoarthropathy, multiple sites: Secondary | ICD-10-CM

## 2019-05-21 DIAGNOSIS — Z125 Encounter for screening for malignant neoplasm of prostate: Secondary | ICD-10-CM

## 2019-05-21 DIAGNOSIS — K219 Gastro-esophageal reflux disease without esophagitis: Secondary | ICD-10-CM | POA: Diagnosis not present

## 2019-05-21 DIAGNOSIS — M159 Polyosteoarthritis, unspecified: Secondary | ICD-10-CM

## 2019-05-21 DIAGNOSIS — G609 Hereditary and idiopathic neuropathy, unspecified: Secondary | ICD-10-CM

## 2019-05-21 DIAGNOSIS — Z85820 Personal history of malignant melanoma of skin: Secondary | ICD-10-CM | POA: Diagnosis not present

## 2019-05-21 LAB — COMPREHENSIVE METABOLIC PANEL
ALT: 21 U/L (ref 0–53)
AST: 22 U/L (ref 0–37)
Albumin: 4.2 g/dL (ref 3.5–5.2)
Alkaline Phosphatase: 67 U/L (ref 39–117)
BUN: 19 mg/dL (ref 6–23)
CO2: 30 mEq/L (ref 19–32)
Calcium: 9.1 mg/dL (ref 8.4–10.5)
Chloride: 103 mEq/L (ref 96–112)
Creatinine, Ser: 1.17 mg/dL (ref 0.40–1.50)
GFR: 60.46 mL/min (ref >60.00)
Glucose, Bld: 105 mg/dL — ABNORMAL HIGH (ref 70–99)
Potassium: 4.5 mEq/L (ref 3.5–5.1)
Sodium: 140 mEq/L (ref 135–145)
Total Bilirubin: 0.6 mg/dL (ref 0.2–1.2)
Total Protein: 6.8 g/dL (ref 6.0–8.3)

## 2019-05-21 LAB — LIPID PANEL
Cholesterol: 172 mg/dL (ref 0–200)
HDL: 40 mg/dL
NonHDL: 131.78
Total CHOL/HDL Ratio: 4
Triglycerides: 207 mg/dL — ABNORMAL HIGH (ref 0.0–149.0)
VLDL: 41.4 mg/dL — ABNORMAL HIGH (ref 0.0–40.0)

## 2019-05-21 LAB — CBC WITH DIFFERENTIAL/PLATELET
Basophils Absolute: 0 10*3/uL (ref 0.0–0.1)
Basophils Relative: 0.7 % (ref 0.0–3.0)
Eosinophils Absolute: 0.1 10*3/uL (ref 0.0–0.7)
Eosinophils Relative: 1.7 % (ref 0.0–5.0)
HCT: 44.9 % (ref 39.0–52.0)
Hemoglobin: 15 g/dL (ref 13.0–17.0)
Lymphocytes Relative: 30.3 % (ref 12.0–46.0)
Lymphs Abs: 1.8 10*3/uL (ref 0.7–4.0)
MCHC: 33.5 g/dL (ref 30.0–36.0)
MCV: 89.9 fl (ref 78.0–100.0)
Monocytes Absolute: 0.7 10*3/uL (ref 0.1–1.0)
Monocytes Relative: 10.8 % (ref 3.0–12.0)
Neutro Abs: 3.4 10*3/uL (ref 1.4–7.7)
Neutrophils Relative %: 56.5 % (ref 43.0–77.0)
Platelets: 168 10*3/uL (ref 150.0–400.0)
RBC: 4.99 Mil/uL (ref 4.22–5.81)
RDW: 14 % (ref 11.5–15.5)
WBC: 6.1 10*3/uL (ref 4.0–10.5)

## 2019-05-21 LAB — TSH: TSH: 1.28 u[IU]/mL (ref 0.35–4.50)

## 2019-05-21 LAB — LDL CHOLESTEROL, DIRECT: Direct LDL: 96 mg/dL

## 2019-05-21 LAB — PSA, MEDICARE: PSA: 0.97 ng/mL (ref 0.10–4.00)

## 2019-05-21 LAB — VITAMIN B12: Vitamin B-12: 173 pg/mL — ABNORMAL LOW (ref 211–911)

## 2019-05-21 NOTE — Patient Instructions (Signed)
Please return in 6 months to recheck urinary symptoms and GERD if needed.  We will call you with your lab results OR send you a letter if they are all normal.   If you have any questions or concerns, please don't hesitate to send me a message via MyChart or call the office at [phone]. Thank you for visiting with [place] today! It's our pleasure caring for you.   Benign Prostatic Hyperplasia  Benign prostatic hyperplasia (BPH) is an enlarged prostate gland that is caused by the normal aging process and not by cancer. The prostate is a walnut-sized gland that is involved in the production of semen. It is located in front of the rectum and below the bladder. The bladder stores urine and the urethra is the tube that carries the urine out of the body. The prostate may get bigger as a man gets older. An enlarged prostate can press on the urethra. This can make it harder to pass urine. The build-up of urine in the bladder can cause infection. Back pressure and infection may progress to bladder damage and kidney (renal) failure. What are the causes? This condition is part of a normal aging process. However, not all men develop problems from this condition. If the prostate enlarges away from the urethra, urine flow will not be blocked. If it enlarges toward the urethra and compresses it, there will be problems passing urine. What increases the risk? This condition is more likely to develop in men over the age of 80 years. What are the signs or symptoms? Symptoms of this condition include:  Getting up often during the night to urinate.  Needing to urinate frequently during the day.  Difficulty starting urine flow.  Decrease in size and strength of your urine stream.  Leaking (dribbling) after urinating.  Inability to pass urine. This needs immediate treatment.  Inability to completely empty your bladder.  Pain when you pass urine. This is more common if there is also an infection.  Urinary  tract infection (UTI). How is this diagnosed? This condition is diagnosed based on your medical history, a physical exam, and your symptoms. Tests will also be done, such as:  A post-void bladder scan. This measures any amount of urine that may remain in your bladder after you finish urinating.  A digital rectal exam. In a rectal exam, your health care provider checks your prostate by putting a lubricated, gloved finger into your rectum to feel the back of your prostate gland. This exam detects the size of your gland and any abnormal lumps or growths.  An exam of your urine (urinalysis).  A prostate specific antigen (PSA) screening. This is a blood test used to screen for prostate cancer.  An ultrasound. This test uses sound waves to electronically produce a picture of your prostate gland. Your health care provider may refer you to a specialist in kidney and prostate diseases (urologist). How is this treated? Once symptoms begin, your health care provider will monitor your condition (active surveillance or watchful waiting). Treatment for this condition will depend on the severity of your condition. Treatment may include:  Observation and yearly exams. This may be the only treatment needed if your condition and symptoms are mild.  Medicines to relieve your symptoms, including: ? Medicines to shrink the prostate. ? Medicines to relax the muscle of the prostate.  Surgery in severe cases. Surgery may include: ? Prostatectomy. In this procedure, the prostate tissue is removed completely through an open incision or with a  laparoscope or robotics. ? Transurethral resection of the prostate (TURP). In this procedure, a tool is inserted through the opening at the tip of the penis (urethra). It is used to cut away tissue of the inner core of the prostate. The pieces are removed through the same opening of the penis. This removes the blockage. ? Transurethral incision (TUIP). In this procedure, small  cuts are made in the prostate. This lessens the prostate's pressure on the urethra. ? Transurethral microwave thermotherapy (TUMT). This procedure uses microwaves to create heat. The heat destroys and removes a small amount of prostate tissue. ? Transurethral needle ablation (TUNA). This procedure uses radio frequencies to destroy and remove a small amount of prostate tissue. ? Interstitial laser coagulation ([place]). This procedure uses a laser to destroy and remove a small amount of prostate tissue. ? Transurethral electrovaporization (TUVP). This procedure uses electrodes to destroy and remove a small amount of prostate tissue. ? Prostatic urethral lift. This procedure inserts an implant to push the lobes of the prostate away from the urethra. Follow these instructions at home:  Take over-the-counter and prescription medicines only as told by your health care provider.  Monitor your symptoms for any changes. Contact your health care provider with any changes.  Avoid drinking large amounts of liquid before going to bed or out in public.  Avoid or reduce how much caffeine or alcohol you drink.  Give yourself time when you urinate.  Keep all follow-up visits as told by your health care provider. This is important. Contact a health care provider if:  You have unexplained back pain.  Your symptoms do not get better with treatment.  You develop side effects from the medicine you are taking.  Your urine becomes very dark or has a bad smell.  Your lower abdomen becomes distended and you have trouble passing your urine. Get help right away if:  You have a fever or chills.  You suddenly cannot urinate.  You feel lightheaded, or very dizzy, or you faint.  There are large amounts of blood or clots in the urine.  Your urinary problems become hard to manage.  You develop moderate to severe low back or flank pain. The flank is the side of your body between the ribs and the hip. These  symptoms may represent a serious problem that is an emergency. Do not wait to see if the symptoms will go away. Get medical help right away. Call your local emergency services (911 in the [place] Do not drive yourself to the hospital. Summary  Benign prostatic hyperplasia (BPH) is an enlarged prostate that is caused by the normal aging process and not by cancer.  An enlarged prostate can press on the urethra. This can make it hard to pass urine.  This condition is part of a normal aging process and is more likely to develop in men over the age of 69 years.  Get help right away if you suddenly cannot urinate. This information is not intended to replace advice given to you by your health care provider. Make sure you discuss any questions you have with your health care provider. Document Revised: 02/18/2018 Document Reviewed: 04/30/2016 Elsevier Patient Education  2020 [employer].

## 2019-05-21 NOTE — Progress Notes (Signed)
Subjective  Chief Complaint  Patient presents with  . Annual Exam    fasting. no new concerns  . Hyperlipidemia    takes atorvastatin daily. no side effects  . Gastroesophageal Reflux    symptoms are stable while taking pantoprazole 40mg     HPI: Stephen Anderson is a 79 y.o. male who presents to [facility] [place] today for a Male Wellness Visit. He also has the concerns and/or needs as listed above in the chief complaint. These will be addressed in addition to the Health Maintenance Visit.   Wellness Visit: annual visit with health maintenance review and exam    HM: 79 yo feels well. Active lifestyle. Declines flu shot. Had first covid vaccination. CRC screen up to date. AWV reviewed. No concerns identified. Lifestyle: Body mass index is 26.54 kg/m. Wt Readings from Last 3 Encounters:  05/21/19 185 lb (83.9 kg)  04/24/19 186 lb 9.6 oz (84.6 kg)  03/20/19 185 lb 12.8 oz (84.3 kg)    Chronic disease management visit and/or acute problem visit:  HLD: fasting for recheck on well tolerated statin  GERD: on high dose bid protonix. Well controlled. Has been on this dose for years.   BPH w/ nocturia. No UTI. No hesitancy. Some urgency sxs w/ decreased urinary output. He tolerates nocturia 1-2x/ night.   H/o melanoma: has regular f/u with derm.   Essential tremor: not worsening by pt report  Peripheral neuropathy: tolerated bid neurontin only. Helps some but still symptomatic. Sees neuro as well.   OSA on cpap: reviewed recent pulm notes. Well controlled. Replaced machine this year.   Patient Active Problem List   Diagnosis Date Noted  . Screening for colorectal cancer 05/21/2019  . 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 History of melanoma 03/26/2018  . GERD (gastroesophageal reflux disease) 02/24/2018  . Mixed hyperlipidemia 02/24/2018  . 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 OSA on CPAP  12/30/2013  . Essential tremor 05/16/2012  . Peripheral neuropathy 05/16/2012   Health Maintenance  Topic Date Due  . Samul Dada  07/09/2020  . PNA vac Low Risk Adult  Completed  . INFLUENZA VACCINE  Discontinued   Immunization History  Administered Date(s) Administered  . Fluad Quad(high Dose 65+) 01/14/2019  . PFIZER SARS-COV-2 Vaccination 05/04/2019  . Pneumococcal Conjugate-13 10/21/2013  . Pneumococcal Polysaccharide-23 07/17/2010  . Td 01/07/2009  . Tdap 07/10/2010  . Zoster 03/16/2017  . Zoster Recombinat (Shingrix) 04/03/2017, 06/17/2017   We updated and reviewed the patient's past history in detail and it is documented below. Allergies: Patient is allergic to oxycontin [oxycodone hcl]. Past Medical History  has a past medical history of Essential tremor, GERD (gastroesophageal reflux disease) (02/24/2018), History of melanoma (03/26/2018), Hypercholesteremia, Mixed hyperlipidemia (02/24/2018), and Peripheral neuropathy. Past Surgical History Patient  has a past surgical history that includes Gallbladder surgery; Hernia repair; Cataract extraction, bilateral; and Melanoma excision. Social History Patient  reports that he quit smoking about 11 years ago. His smoking use included cigarettes. He has a 50.00 pack-year smoking history. He quit smokeless tobacco use about 17 years ago. He reports that he does not drink alcohol or use drugs. Family History family history includes Bladder Cancer in his brother; Heart disease in his brother; Lung cancer in his father. Review of Systems: Constitutional: negative for fever or malaise Ophthalmic: negative for photophobia, double vision or loss of vision Cardiovascular: negative for chest pain, dyspnea on exertion, or new LE swelling Respiratory:  negative for SOB or persistent cough Gastrointestinal: negative for abdominal pain, change in bowel habits or melena Genitourinary: negative for dysuria or gross  hematuria Musculoskeletal: negative for new gait disturbance or muscular weakness Integumentary: negative for new or persistent rashes Neurological: negative for TIA or stroke symptoms Psychiatric: negative for SI or delusions Allergic/Immunologic: negative for hives  Patient Care Team    Relationship Specialty Notifications Start End  Leamon Arnt, MD PCP - General Family Medicine  02/24/18   Tat, Eustace Quail, DO Consulting Physician Neurology  02/24/18   Deneise Lever, MD Consulting Physician Pulmonary Disease  02/24/18   Ronne Binning  Dentistry  03/26/18   Sydnee Cabal, MD Consulting Physician Orthopedic Surgery  03/26/18   Evelina Bucy, DPM Consulting Physician Podiatry  03/26/18   Ronald Lobo, MD Consulting Physician Gastroenterology  03/26/18   Luberta Mutter, MD Consulting Physician Ophthalmology  03/26/18   Luberta Mutter, MD Consulting Physician Ophthalmology  03/20/19   Lavonna Monarch, MD Consulting Physician Dermatology  03/20/19    Objective  Vitals: BP 138/90 (BP Location: Left Arm, Patient Position: Sitting, Cuff Size: Normal)   Pulse 60   Temp 97.6 F (36.4 C) (Temporal)   Ht 5\' 10"  (1.778 m)   Wt 185 lb (83.9 kg)   SpO2 99%   BMI 26.54 kg/m  General:  Well developed, well nourished, no acute distress  Psych:  Alert and orientedx3,normal mood and affect HEENT:  Normocephalic, atraumatic, non-icteric sclera, PERRLsupple neck without adenopathy, mass or thyromegaly Cardiovascular:  Normal S1, S2, RRR without gallop, rub or murmur,+2 distal pulses in bilateral upper and lower extremities. Respiratory:  Good breath sounds bilaterally, CTAB with normal respiratory effort Gastrointestinal: normal bowel sounds, soft, non-tender, no noted masses. No HSM MSK: no deformities, contusions. Joints are without erythema or swelling. Spine and CVA region are nontender Skin:  Warm, no rashes or suspicious lesions noted Neurologic:    Mental status is normal.  CN 2-11 are normal. Gross motor and sensory exams are normal. Stable gait. No tremor GU: No inguinal hernias or adenopathy are appreciated bilaterally   Assessment  1. Annual physical exam   2. Essential tremor   3. Mixed hyperlipidemia   4. Primary osteoarthritis involving multiple joints   5. Idiopathic peripheral neuropathy   6. BPH with obstruction/lower urinary tract symptoms   7. Gastroesophageal reflux disease, unspecified whether esophagitis present   8. History of melanoma   9. OSA on CPAP   10. Screening for colorectal cancer      Plan  Male Wellness Visit:  Age appropriate Health Maintenance and Prevention measures were discussed with patient. Included topics are cancer screening recommendations, ways to keep healthy (see AVS) including dietary and exercise recommendations, regular eye and dental care, use of seat belts, and avoidance of moderate alcohol use and tobacco use. Screens utd  BMI: discussed patient's BMI and encouraged positive lifestyle modifications to help get to or maintain a target BMI.  HM needs and immunizations were addressed and ordered. See below for orders. See HM and immunization section for updates.  Routine labs and screening tests ordered including cmp, cbc and lipids where appropriate.  Discussed recommendations regarding Vit D and calcium supplementation (see AVS)  Chronic disease f/u and/or acute problem visit: (deemed necessary to be done in addition to the wellness visit):  HLD recheck today on statin.   Tremor and neuropathy are stable. Continue bid neurontin  BPH: screen for prostate cancer. See avs. Pt defers medical treatment at  this time. Will monitor. Start alpha blocker if worsens.   Gerd: trial of decreasing to daily dosing from bid. Check vit b12  Follow up: 6 months to recheck urinary sxs and gerd   Commons side effects, risks, benefits, and alternatives for medications and treatment plan prescribed today were discussed,  and the patient expressed understanding of the given instructions. Patient is instructed to call or message via MyChart if he/she has any questions or concerns regarding our treatment plan. No barriers to understanding were identified. We discussed Red Flag symptoms and signs in detail. Patient expressed understanding regarding what to do in case of urgent or emergency type symptoms.   Medication list was reconciled, printed and provided to the patient in AVS. Patient instructions and summary information was reviewed with the patient as documented in the AVS. This note was prepared with assistance of Dragon voice recognition software. Occasional wrong-word or sound-a-like substitutions may have occurred due to the inherent limitations of voice recognition software  This visit occurred during the SARS-CoV-2 public health emergency.  Safety protocols were in place, including screening questions prior to the visit, additional usage of staff PPE, and extensive cleaning of exam room while observing appropriate contact time as indicated for disinfecting solutions.   Orders Placed This Encounter  Procedures  . CBC with Differential/Platelet  . Comprehensive metabolic panel  . Lipid panel  . Vitamin B12  . PSA, Medicare ( [place] [facility] only)  . TSH   No orders of the defined types were placed in this encounter.

## 2019-05-25 ENCOUNTER — Ambulatory Visit: Payer: PPO | Attending: Internal Medicine

## 2019-05-25 ENCOUNTER — Encounter: Payer: Self-pay | Admitting: Family Medicine

## 2019-05-25 DIAGNOSIS — Z23 Encounter for immunization: Secondary | ICD-10-CM | POA: Insufficient documentation

## 2019-05-25 DIAGNOSIS — E538 Deficiency of other specified B group vitamins: Secondary | ICD-10-CM | POA: Insufficient documentation

## 2019-05-25 NOTE — Progress Notes (Signed)
   Covid-19 Vaccination Clinic  Name:  Stephen Anderson    MRN: [id number] DOB: Jun 08, 1940  05/25/2019  [name] was observed post Covid-19 immunization for 15 minutes without incidence. He was provided with Vaccine Information Sheet and instruction to access the V-Safe system.   [name] was instructed to call 911 with any severe reactions post vaccine: Marland Kitchen Difficulty breathing  . Swelling of your face and throat  . A fast heartbeat  . A bad rash all over your body  . Dizziness and weakness    Immunizations Administered    Name Date Dose VIS Date Route   Pfizer COVID-19 Vaccine 05/25/2019  9:33 AM 0.3 mL 03/20/2019 Intramuscular   Manufacturer: [place]   Lot: [id number]   [place]: [id number]

## 2019-05-25 NOTE — Progress Notes (Signed)
Please call patient: I have reviewed his/her lab results. Labs look great but vitamin B12 is low; this in part is due to his gerd medication.  Please try lowering dose of protonix as we discussed and start an otc vit b12 supplement at 1039mcg daily.  His fasting sugar remains just above normal. Avoid sweets/sweetened beverages/desserts etc and I will follow over time. No other changes are needed at this time.

## 2019-06-05 DIAGNOSIS — G4733 Obstructive sleep apnea (adult) (pediatric): Secondary | ICD-10-CM | POA: Diagnosis not present

## 2019-06-26 ENCOUNTER — Other Ambulatory Visit: Payer: Self-pay | Admitting: Family Medicine

## 2019-06-26 NOTE — Telephone Encounter (Signed)
LAST APPOINTMENT DATE: 05/21/2019  NEXT APPOINTMENT DATE:11/19/2019   LAST REFILL: 09/26/2019  QTY: 270 CAPSULE

## 2019-06-30 ENCOUNTER — Telehealth: Payer: Self-pay

## 2019-06-30 NOTE — Telephone Encounter (Signed)
Patient wife regarding gabapentin (NEURONTIN) 300 MG capsule. Patient states that she called the pharmacy several times regarding medication. Informed patient that it refill was sent in on 06/26/19 to the pharmacy on file.

## 2019-07-03 DIAGNOSIS — G4733 Obstructive sleep apnea (adult) (pediatric): Secondary | ICD-10-CM | POA: Diagnosis not present

## 2019-07-10 ENCOUNTER — Other Ambulatory Visit: Payer: Self-pay | Admitting: Family Medicine

## 2019-08-03 DIAGNOSIS — G4733 Obstructive sleep apnea (adult) (pediatric): Secondary | ICD-10-CM | POA: Diagnosis not present

## 2019-08-13 DIAGNOSIS — G4733 Obstructive sleep apnea (adult) (pediatric): Secondary | ICD-10-CM | POA: Diagnosis not present

## 2019-09-02 DIAGNOSIS — G4733 Obstructive sleep apnea (adult) (pediatric): Secondary | ICD-10-CM | POA: Diagnosis not present

## 2019-10-03 DIAGNOSIS — G4733 Obstructive sleep apnea (adult) (pediatric): Secondary | ICD-10-CM | POA: Diagnosis not present

## 2019-11-02 DIAGNOSIS — G4733 Obstructive sleep apnea (adult) (pediatric): Secondary | ICD-10-CM | POA: Diagnosis not present

## 2019-11-13 ENCOUNTER — Ambulatory Visit: Payer: PPO | Admitting: Physician Assistant

## 2019-11-19 ENCOUNTER — Ambulatory Visit: Payer: PPO | Admitting: Family Medicine

## 2019-11-20 ENCOUNTER — Ambulatory Visit (INDEPENDENT_AMBULATORY_CARE_PROVIDER_SITE_OTHER): Payer: PPO | Admitting: Family Medicine

## 2019-11-20 ENCOUNTER — Encounter: Payer: Self-pay | Admitting: Family Medicine

## 2019-11-20 ENCOUNTER — Other Ambulatory Visit: Payer: Self-pay

## 2019-11-20 VITALS — BP 122/72 | HR 58 | Temp 97.2°F | Resp 18 | Ht 70.0 in | Wt 172.2 lb

## 2019-11-20 DIAGNOSIS — E538 Deficiency of other specified B group vitamins: Secondary | ICD-10-CM

## 2019-11-20 DIAGNOSIS — N138 Other obstructive and reflux uropathy: Secondary | ICD-10-CM | POA: Diagnosis not present

## 2019-11-20 DIAGNOSIS — K219 Gastro-esophageal reflux disease without esophagitis: Secondary | ICD-10-CM | POA: Diagnosis not present

## 2019-11-20 DIAGNOSIS — M8949 Other hypertrophic osteoarthropathy, multiple sites: Secondary | ICD-10-CM | POA: Diagnosis not present

## 2019-11-20 DIAGNOSIS — N401 Enlarged prostate with lower urinary tract symptoms: Secondary | ICD-10-CM

## 2019-11-20 DIAGNOSIS — G609 Hereditary and idiopathic neuropathy, unspecified: Secondary | ICD-10-CM

## 2019-11-20 DIAGNOSIS — M159 Polyosteoarthritis, unspecified: Secondary | ICD-10-CM

## 2019-11-20 MED ORDER — CELECOXIB 400 MG PO CAPS: 400.0000 mg | ORAL_CAPSULE | Freq: Every day | ORAL | 11 refills | Status: DC

## 2019-11-20 MED ORDER — PANTOPRAZOLE SODIUM 40 MG PO TBEC: 40.0000 mg | DELAYED_RELEASE_TABLET | Freq: Every day | ORAL | 3 refills | Status: DC

## 2019-11-20 MED ORDER — GABAPENTIN 300 MG PO CAPS: ORAL_CAPSULE | ORAL | 3 refills | Status: DC

## 2019-11-20 NOTE — Progress Notes (Signed)
Subjective  CC:  Chief Complaint  Patient presents with  . Gastroesophageal Reflux    Has improved since his last office visit   . Urinary Symptoms    Has improved since his last office visit    HPI: Stephen Anderson is a 79 y.o. male who presents to the office today to address the problems listed above in the chief complaint.  GERD follow-up: We discussed decreasing his PPI to once a day dosing however he never did this.  His symptoms are well controlled  Vitamin B12 deficiency identified at last visit, 6 months ago.  He has been on supplementation.  Due for recheck.  Likely due to high-dose chronic PPI use.  BPH symptoms: Persist but are no worse.  Nocturia once or twice nightly.  No significant hesitancy.  Osteoarthritis: Hands knees shoulders, using Voltaren gel and going through to weekly.  Has daily pain.  Does a lot of manual labor.  Uses Tylenol Extra Strength twice daily as well.  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  Assessment  1. Gastroesophageal reflux disease, unspecified whether esophagitis present   2. BPH with obstruction/lower urinary tract symptoms   3. Vitamin B12 deficiency   4. Primary osteoarthritis involving multiple joints   5. Idiopathic peripheral neuropathy      Plan   GERD: Well controlled but would like him to try decreasing to daily dosing.  Education given.  BPH, mild symptoms.  Monitor  B12 deficiency for recheck today.  Continue supplementation.  Osteoarthritis: Tylenol, Voltaren gel and will add Celebrex daily.  Idiopathic peripheral neuropathy: Educated on safety issues given high risk jobs that require significant balance and body awareness.   Follow up: 6 months for complete physical Visit date not found  Orders Placed This Encounter  Procedures  . Vitamin B12   Meds ordered this  encounter  Medications  . gabapentin (NEURONTIN) 300 MG capsule    Sig: Take 1 capsule (300 mg total) by mouth in the morning AND 2 capsules (600 mg total) at bedtime.    Dispense:  270 capsule    Refill:  3  . celecoxib (CELEBREX) 400 MG capsule    Sig: Take 1 capsule (400 mg total) by mouth daily.    Dispense:  30 capsule    Refill:  11  . pantoprazole (PROTONIX) 40 MG tablet    Sig: Take 1 tablet (40 mg total) by mouth daily.    Dispense:  90 tablet    Refill:  3    Please keep on file for next refill due in October 2021. Decreasing dose to once a day.      I reviewed the patients updated PMH, FH, and SocHx.    Patient Active Problem List   Diagnosis Date Noted  . Vitamin B12 deficiency 05/25/2019  . Screening for colorectal cancer 05/21/2019  . 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 History of melanoma 03/26/2018  . GERD (gastroesophageal reflux disease) 02/24/2018  . Mixed hyperlipidemia 02/24/2018  . 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 OSA on CPAP 12/30/2013  . Essential tremor 05/16/2012  . Peripheral neuropathy 05/16/2012   Current Meds  Medication Sig  . Acetaminophen (TYLENOL ARTHRITIS EXT RELIEF PO) Take 1 tablet by mouth every 8 (eight) hours.  Marland Kitchen aspirin 81 MG tablet Take 81  mg by mouth every other day.   Marland Kitchen atorvastatin (LIPITOR) 40 MG tablet Take 1 tablet (40 mg total) by mouth at bedtime.  . caffeine (STAY AWAKE) 200 MG TABS tablet Take 200 mg by mouth daily. 1/2 every morning  . diclofenac Sodium (VOLTAREN) 1 % GEL apply sparingly to affected areas up to twice a day  . fluticasone (FLONASE) 50 MCG/ACT nasal spray Place 1 spray into both nostrils daily.  Marland Kitchen gabapentin (NEURONTIN) 300 MG capsule Take 1 capsule (300 mg total) by mouth in the morning AND 2 capsules (600 mg total) at bedtime.  . NON FORMULARY CPAP  . pantoprazole (PROTONIX) 40 MG tablet Take 1 tablet (40 mg total) by mouth daily.  .  primidone (MYSOLINE) 50 MG tablet TAKE ONE TABLET BY MOUTH EVERY MORNING AND ONE TABLET IN THE EVENING  . [DISCONTINUED] gabapentin (NEURONTIN) 300 MG capsule TAKE ONE CAPSULE BY MOUTH THREE TIMES DAILY (Patient taking differently: 2 (two) times daily. 300 mg in the morning and 600 mg at bedtime)  . [DISCONTINUED] pantoprazole (PROTONIX) 40 MG tablet TAKE ONE TABLET BY MOUTH TWICE DAILY    Allergies: Patient is allergic to oxycontin [oxycodone hcl]. Family History: Patient family history includes Bladder Cancer in his brother; Heart disease in his brother; Lung cancer in his father. Social History:  Patient  reports that he quit smoking about 11 years ago. His smoking use included cigarettes. He has a 50.00 pack-year smoking history. He quit smokeless tobacco use about 17 years ago. He reports that he does not drink alcohol and does not use drugs.  Review of Systems: Constitutional: Negative for fever malaise or anorexia Cardiovascular: negative for chest pain Respiratory: negative for SOB or persistent cough Gastrointestinal: negative for abdominal pain  Objective  Vitals: BP 122/72   Pulse (!) 58   Temp (!) 97.2 F (36.2 C) (Temporal)   Resp 18   Ht 5\' 10"  (1.778 m)   Wt 172 lb 3.2 oz (78.1 kg)   SpO2 98%   BMI 24.71 kg/m  General: no acute distress , A&Ox3      Commons side effects, risks, benefits, and alternatives for medications and treatment plan prescribed today were discussed, and the patient expressed understanding of the given instructions. Patient is instructed to call or message via MyChart if he/she has any questions or concerns regarding our treatment plan. No barriers to understanding were identified. We discussed Red Flag symptoms and signs in detail. Patient expressed understanding regarding what to do in case of urgent or emergency type symptoms.   Medication list was reconciled, printed and provided to the patient in AVS. Patient instructions and summary  information was reviewed with the patient as documented in the AVS. This note was prepared with assistance of Dragon voice recognition software. Occasional wrong-word or sound-a-like substitutions may have occurred due to the inherent limitations of voice recognition software  This visit occurred during the SARS-CoV-2 public health emergency.  Safety protocols were in place, including screening questions prior to the visit, additional usage of staff PPE, and extensive cleaning of exam room while observing appropriate contact time as indicated for disinfecting solutions.

## 2019-11-20 NOTE — Patient Instructions (Signed)
Please return in 6 months for your annual complete physical; please come fasting.  Please decrease your indigestion medication to once a day.  Continue your daily Vit b12 supplements.  Try the celebrex once a day; it should help your arthritic pain.  If you have any questions or concerns, please don't hesitate to send me a message via MyChart or call the office at [phone]. Thank you for visiting with [place] today! It's our pleasure caring for you.

## 2019-11-21 LAB — VITAMIN B12: Vitamin B-12: 741 pg/mL (ref 200–1100)

## 2019-11-23 NOTE — Progress Notes (Signed)
Please call patient: I have reviewed his/her lab results. His Vitamin b12 is now normal. Keep taking daily supplement. thanks

## 2019-12-02 DIAGNOSIS — H52203 Unspecified astigmatism, bilateral: Secondary | ICD-10-CM | POA: Diagnosis not present

## 2019-12-02 DIAGNOSIS — Z961 Presence of intraocular lens: Secondary | ICD-10-CM | POA: Diagnosis not present

## 2019-12-02 DIAGNOSIS — D23112 Other benign neoplasm of skin of right lower eyelid, including canthus: Secondary | ICD-10-CM | POA: Diagnosis not present

## 2019-12-03 ENCOUNTER — Encounter: Payer: Self-pay | Admitting: Dermatology

## 2019-12-03 ENCOUNTER — Other Ambulatory Visit: Payer: Self-pay

## 2019-12-03 ENCOUNTER — Ambulatory Visit: Payer: PPO | Admitting: Dermatology

## 2019-12-03 DIAGNOSIS — L570 Actinic keratosis: Secondary | ICD-10-CM | POA: Diagnosis not present

## 2019-12-03 DIAGNOSIS — L2084 Intrinsic (allergic) eczema: Secondary | ICD-10-CM

## 2019-12-03 DIAGNOSIS — D0439 Carcinoma in situ of skin of other parts of face: Secondary | ICD-10-CM | POA: Diagnosis not present

## 2019-12-03 DIAGNOSIS — C4492 Squamous cell carcinoma of skin, unspecified: Secondary | ICD-10-CM

## 2019-12-03 DIAGNOSIS — G4733 Obstructive sleep apnea (adult) (pediatric): Secondary | ICD-10-CM | POA: Diagnosis not present

## 2019-12-03 DIAGNOSIS — D485 Neoplasm of uncertain behavior of skin: Secondary | ICD-10-CM

## 2019-12-03 HISTORY — DX: Squamous cell carcinoma of skin, unspecified: C44.92

## 2019-12-03 NOTE — Patient Instructions (Addendum)
Biopsy, Surgery (Curettage) & Surgery (Excision) Aftercare Instructions  1. Okay to remove bandage in 24 hours  2. Wash area with soap and water  3. Apply Vaseline to area twice daily until healed (Not Neosporin)  4. Okay to cover with a Band-Aid to decrease the chance of infection or prevent irritation from clothing; also it's okay to uncover lesion at home.  5. Suture instructions: return to our office in 7-10 or 10-14 days for a nurse visit for suture removal. Variable healing with sutures, if pain or itching occurs call our office. It's okay to shower or bathe 24 hours after sutures are given.  6. The following risks may occur after a biopsy, curettage or excision: bleeding, scarring, discoloration, recurrence, infection (redness, yellow drainage, pain or swelling).  7. For questions, concerns and results call our office at [place] before 4pm & Friday before 3pm. Biopsy results will be available in 1 week.  Several issues addressed today with Mr. Stephen Anderson.  His active breaking out is under control with use of fluticasone on the face and clobetasol on the chest.  However he is using the fluticasone twice daily and the residual spots on the face are mainly sun damage.  I advised him to try to slowly cut back on the fluticasone from twice daily to once daily to twice a week.  He has about a dozen crusted spots on the face which are sun damage. the thickest one on the right sideburn was biopsied; if Stephen Anderson will call the office in 3 days we will  review the results with him.  The other spots are precancerous. the thickest of these were treated with liquid nitrogen freeze; these will swell and may peel in the next 2 weeks.  He is a good candidate for PDT therapy in the late fall or winter.  Brochure provided by Stephen Anderson and we will plan on seeing him at that time.

## 2019-12-05 ENCOUNTER — Encounter: Payer: Self-pay | Admitting: Dermatology

## 2019-12-05 NOTE — Progress Notes (Signed)
   Follow-Up Visit   Subjective  Stephen Anderson is a 79 y.o. male who presents for the following: Skin Problem (both temples - sclay spot).  Crusts Location: Face Duration:  Quality:  Associated Signs/Symptoms: Modifying Factors: Using fluticasone twice daily on areas Severity:  Timing: Context:   Objective  Well appearing patient in no apparent distress; mood and affect are within normal limits.  All skin waist up examined.   Assessment & Plan    Neoplasm of uncertain behavior of skin Right Parotid Area  Skin / nail biopsy Type of biopsy: tangential   Informed consent: discussed and consent obtained   Timeout: patient name, date of birth, surgical site, and procedure verified   Procedure prep:  Patient was prepped and draped in usual sterile fashion Prep type:  Chlorhexidine Anesthesia: the lesion was anesthetized in a standard fashion   Anesthetic:  1% lidocaine w/ epinephrine 1-100,000 local infiltration Instrument used: flexible razor blade   Hemostasis achieved with: ferric subsulfate   Outcome: patient tolerated procedure well   Post-procedure details: wound care instructions given    Specimen 1 - Surgical pathology Differential Diagnosis: bcc vs scc Check Margins: No  AK (actinic keratosis) (11) Mid Frontal Scalp; Left Parotid Area (4); Right Parotid Area (6)  Destruction of lesion - Left Parotid Area, Mid Frontal Scalp, Right Parotid Area Complexity: simple   Destruction method: cryotherapy   Informed consent: discussed and consent obtained   Timeout:  patient name, date of birth, surgical site, and procedure verified Lesion destroyed using liquid nitrogen: Yes   Region frozen until ice ball extended beyond lesion: Yes   Cryotherapy cycles:  5 Outcome: patient tolerated procedure well with no complications    Intrinsic eczema (2) Left Temple; Right Temple  Taper facial fluticasone from twice daily to once daily; if doing all right in 3 to 4 weeks  we will try to taper further and discontinue.     I, Lavonna Monarch, MD, have reviewed all documentation for this visit.  The documentation on 12/05/19 for the exam, diagnosis, procedures, and orders are all accurate and complete.

## 2019-12-07 ENCOUNTER — Encounter: Payer: Self-pay | Admitting: *Deleted

## 2019-12-07 ENCOUNTER — Telehealth: Payer: Self-pay | Admitting: *Deleted

## 2019-12-07 NOTE — Telephone Encounter (Signed)
-----   Message from Lavonna Monarch, MD sent at 12/05/2019  8:51 AM EDT ----- Schedule surgery with Dr. Darene Lamer

## 2019-12-07 NOTE — Telephone Encounter (Signed)
Pathology results to patient, surgery appointment made.

## 2019-12-08 ENCOUNTER — Other Ambulatory Visit: Payer: Self-pay | Admitting: Family Medicine

## 2019-12-08 MED ORDER — CELECOXIB 200 MG PO CAPS
200.0000 mg | ORAL_CAPSULE | Freq: Every day | ORAL | 3 refills | Status: DC
Start: 2019-12-08 — End: 2021-02-06

## 2019-12-16 ENCOUNTER — Other Ambulatory Visit: Payer: Self-pay | Admitting: Family Medicine

## 2020-01-03 DIAGNOSIS — G4733 Obstructive sleep apnea (adult) (pediatric): Secondary | ICD-10-CM | POA: Diagnosis not present

## 2020-01-08 ENCOUNTER — Ambulatory Visit: Payer: PPO | Admitting: Physician Assistant

## 2020-01-11 ENCOUNTER — Other Ambulatory Visit: Payer: Self-pay | Admitting: Family Medicine

## 2020-01-28 ENCOUNTER — Ambulatory Visit: Payer: PPO | Admitting: Dermatology

## 2020-02-02 DIAGNOSIS — G4733 Obstructive sleep apnea (adult) (pediatric): Secondary | ICD-10-CM | POA: Diagnosis not present

## 2020-02-09 ENCOUNTER — Ambulatory Visit: Payer: PPO | Admitting: Dermatology

## 2020-02-11 ENCOUNTER — Ambulatory Visit: Payer: PPO | Admitting: Dermatology

## 2020-02-26 ENCOUNTER — Other Ambulatory Visit: Payer: Self-pay | Admitting: Family Medicine

## 2020-02-29 ENCOUNTER — Ambulatory Visit: Payer: PPO

## 2020-03-02 ENCOUNTER — Other Ambulatory Visit: Payer: Self-pay | Admitting: Family Medicine

## 2020-03-10 ENCOUNTER — Other Ambulatory Visit: Payer: Self-pay

## 2020-03-10 ENCOUNTER — Ambulatory Visit: Payer: PPO

## 2020-03-10 ENCOUNTER — Ambulatory Visit (INDEPENDENT_AMBULATORY_CARE_PROVIDER_SITE_OTHER): Payer: PPO | Admitting: Dermatology

## 2020-03-10 DIAGNOSIS — D0439 Carcinoma in situ of skin of other parts of face: Secondary | ICD-10-CM

## 2020-03-10 DIAGNOSIS — D049 Carcinoma in situ of skin, unspecified: Secondary | ICD-10-CM

## 2020-03-10 NOTE — Patient Instructions (Signed)

## 2020-03-16 ENCOUNTER — Encounter: Payer: Self-pay | Admitting: Dermatology

## 2020-03-16 NOTE — Progress Notes (Signed)
   Follow-Up Visit   Subjective  Stephen Anderson is a 79 y.o. male who presents for the following: Procedure (CIS right parotid area.).  CIS Location: Right cheek Duration:  Quality:  Associated Signs/Symptoms: Modifying Factors:  Severity:  Timing: Context: For treatment  Objective  Well appearing patient in no apparent distress; mood and affect are within normal limits.  A focused examination was performed including Head and neck. Relevant physical exam findings are noted in the Assessment and Plan.   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  Destruction of lesion Complexity: simple   Destruction method: electrodesiccation and curettage   Informed consent: discussed and consent obtained   Timeout:  patient name, date of birth, surgical site, and procedure verified Anesthesia: the lesion was anesthetized in a standard fashion   Anesthetic:  1% lidocaine w/ epinephrine 1-100,000 local infiltration Curettage performed in three different directions: Yes   Curettage cycles:  3 Lesion length (cm):  1.1 Lesion width (cm):  1 Margin per side (cm):  0 Final wound size (cm):  1.1 Hemostasis achieved with:  ferric subsulfate Outcome: patient tolerated procedure well with no complications   Additional details:  Wound innoculated with 5 fluorouracil solution.  Follow up in 3-4 months.      I, Lavonna Monarch, MD, have reviewed all documentation for this visit.  The documentation on 03/16/20 for the exam, diagnosis, procedures, and orders are all accurate and complete.

## 2020-03-18 ENCOUNTER — Telehealth: Payer: Self-pay | Admitting: *Deleted

## 2020-03-18 NOTE — Telephone Encounter (Signed)
Patient was calling for results but he just had a spot treated so no new pathology was sent. Patient said lesion is healing well no concerns.

## 2020-03-24 ENCOUNTER — Ambulatory Visit: Payer: PPO

## 2020-04-11 ENCOUNTER — Ambulatory Visit: Payer: PPO

## 2020-04-13 ENCOUNTER — Ambulatory Visit (INDEPENDENT_AMBULATORY_CARE_PROVIDER_SITE_OTHER): Payer: PPO

## 2020-04-13 ENCOUNTER — Other Ambulatory Visit: Payer: Self-pay

## 2020-04-13 DIAGNOSIS — L570 Actinic keratosis: Secondary | ICD-10-CM

## 2020-04-13 MED ORDER — AMINOLEVULINIC ACID HCL 10 % EX GEL
2000.0000 mg | Freq: Once | CUTANEOUS | Status: AC
  Administered 2020-04-13: 2000 mg via TOPICAL

## 2020-04-13 NOTE — Progress Notes (Addendum)
Photodynamic Therapy Procedure Note Diagnosis: Actinic keratosis Location: Face Informed Consent: Discussed risks (burning, pain, redness, peeling, severe sunburn-like reaction, blistering, discoloration, lack of resolution) and benefits of the procedure, as well as the alternatives. Informed consent was obtained. Preparation: After cleansing the skin, the area to be treated was coated with Ameluz.  This was allowed to sit on the skin for 90 minutes. Procedure Details: The patient was placed under the light source with appropriate eye protection for 16 minutes 42 seconds. After completing the treatment, the patient applied sunscreen to the treated areas. Patient tolerated the procedure well Plan: Avoid any sun exposure for the next 24 hours. Wear sunscreen daily for the next week. Observe normal sun precautions thereafter. Recommend OTC analgesia as needed for pain. Follow-up in 10 weeks.  

## 2020-04-13 NOTE — Patient Instructions (Signed)

## 2020-04-15 ENCOUNTER — Encounter: Payer: Self-pay | Admitting: Family Medicine

## 2020-04-15 ENCOUNTER — Other Ambulatory Visit: Payer: Self-pay

## 2020-04-15 ENCOUNTER — Ambulatory Visit (INDEPENDENT_AMBULATORY_CARE_PROVIDER_SITE_OTHER): Payer: PPO | Admitting: Family Medicine

## 2020-04-15 ENCOUNTER — Ambulatory Visit (INDEPENDENT_AMBULATORY_CARE_PROVIDER_SITE_OTHER): Payer: PPO

## 2020-04-15 VITALS — BP 136/76 | HR 67 | Temp 99.9°F | Ht 70.0 in | Wt 181.4 lb

## 2020-04-15 DIAGNOSIS — M19041 Primary osteoarthritis, right hand: Secondary | ICD-10-CM

## 2020-04-15 DIAGNOSIS — M19042 Primary osteoarthritis, left hand: Secondary | ICD-10-CM

## 2020-04-15 DIAGNOSIS — M79644 Pain in right finger(s): Secondary | ICD-10-CM

## 2020-04-15 DIAGNOSIS — M79641 Pain in right hand: Secondary | ICD-10-CM | POA: Diagnosis not present

## 2020-04-15 NOTE — Patient Instructions (Addendum)
Health Maintenance Due  Topic Date Due  . COVID-19 Vaccine (3 - Pfizer risk 4-dose series)- send [place] the date of your covid booster and we will og it in the system 06/22/2019   Please stop by x-ray before you go If you have mychart- we will send your results within 3 business days of [place] receiving them.  If you do not have mychart- we will call you about results within 5 business days of [place] receiving them.   Recommended follow up: as needed for acute concerns. Suspect this is arthritis- happy to refer you to sports medicine if needed.

## 2020-04-15 NOTE — Progress Notes (Signed)
Phone [phone] In person visit   Subjective:   Stephen Anderson is a 80 y.o. year old very pleasant male patient who presents for/with See problem oriented charting Chief Complaint  Patient presents with  . Hand Pain    Right ring finger pain , Patient states it's swollen. Pain has been going on for about a month.    This visit occurred during the SARS-CoV-2 public health emergency.  Safety protocols were in place, including screening questions prior to the visit, additional usage of staff PPE, and extensive cleaning of exam room while observing appropriate contact time as indicated for disinfecting solutions.   Past Medical History-  Patient Active Problem List   Diagnosis Date Noted  . Vitamin B12 deficiency 05/25/2019  . Screening for colorectal cancer 05/21/2019  . 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 History of melanoma 03/26/2018  . GERD (gastroesophageal reflux disease) 02/24/2018  . Mixed hyperlipidemia 02/24/2018  . 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 OSA on CPAP 12/30/2013  . Essential tremor 05/16/2012  . Peripheral neuropathy 05/16/2012    Medications- reviewed and updated Current Outpatient Medications  Medication Sig Dispense Refill  . Acetaminophen (TYLENOL ARTHRITIS EXT RELIEF PO) Take 1 tablet by mouth every 8 (eight) hours.    Marland Kitchen aspirin 81 MG tablet Take 81 mg by mouth every other day.     Marland Kitchen atorvastatin (LIPITOR) 40 MG tablet Take 1 tablet (40 mg total) by mouth at bedtime. 90 tablet 3  . caffeine 200 MG TABS tablet Take 200 mg by mouth daily. 1/2 every morning    . celecoxib (CELEBREX) 200 MG capsule Take 1 capsule (200 mg total) by mouth daily. 90 capsule 3  . diclofenac Sodium (VOLTAREN) 1 % GEL apply sparingly to affected areas up to twice a day 100 g 5  . fluticasone (FLONASE) 50 MCG/ACT nasal spray Place 1 spray into both nostrils daily. 16 g 6  . gabapentin (NEURONTIN) 300 MG capsule TAKE  ONE CAPSULE BY MOUTH THREE TIMES DAILY 270 capsule 3  . NON FORMULARY CPAP    . pantoprazole (PROTONIX) 40 MG tablet Take 1 tablet (40 mg total) by mouth daily. 90 tablet 3  . primidone (MYSOLINE) 50 MG tablet TAKE ONE TABLET BY MOUTH EVERY MORNING AND ONE TABLET IN THE EVENING 180 tablet 1   No current facility-administered medications for this visit.     Objective:  BP 136/76   Pulse 67   Temp 99.9 F (37.7 C) (Temporal)   Ht 5\' 10"  (1.778 m)   Wt 181 lb 6.4 oz (82.3 kg)   SpO2 97%   BMI 26.03 kg/m  Gen: NAD, resting comfortably CV: RRR no murmurs rubs or gallops Lungs: CTAB no crackles, wheeze, rhonchi Ext: no edema in bilateral hands MSK: Patient with tenderness to palpation at right ring finger/fourth finger MCP joint-worse with flexion and extension of the joint.  No obvious trigger finger.     Assessment and Plan   #Right ring finger pain. S: Patient reports issue started back in at least october. No injuries or falls that he is aware of.   He has noted swelling and feels pain.   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 Does report fair amount of arthritis. Last time he used hands aggressively for handiman work was in November- has not improved since stopping those activities. No catching of finger.  He has a prescription for Celebrex at baseline as well as diclofenac gel- using both of these and helps some.   Worst pain he experiences is about 4/10. He is right handed.  A/P: Patient with right ring finger pain at MCP joint.  Strongly suspect arthritis.  Patient reports he has a history of arthritis in the hands-this appears to be an exacerbation.  Has not responded as well to typical Celebrex and diclofenac gel-with prior heavy handwork with his job he would like to rule out fracture or bony abnormality-x-rays were ordered today.  He can continue diclofenac gel and Celebrex-has follow-up with Dr.  Jonni Sanger next month and with ongoing Celebrex use likely update renal function -Also offered referral to sports medicine to consider injection-he declines that at present  Recommended follow up: As needed for acute concern-sooner if symptoms worsen Future Appointments  Date Time Provider [place]  04/25/2020  9:30 AM Deneise Lever, MD [facility] None  05/24/2020  8:00 AM Leamon Arnt, MD [facility]  06/22/2020  8:00 AM Lavonna Monarch, MD [facility]    Lab/Order associations:   ICD-10-CM   1. Finger pain, right  M79.644 DG Hand Complete Right  2. Primary osteoarthritis of both hands  M19.041    M19.042    Return precautions advised.  [name], MD

## 2020-04-18 ENCOUNTER — Telehealth: Payer: Self-pay

## 2020-04-18 NOTE — Telephone Encounter (Signed)
Patient called in about his xray results, advised him of Dr.Hunters comments, He gave a verbal understanding.

## 2020-04-25 ENCOUNTER — Ambulatory Visit: Payer: PPO | Admitting: Internal Medicine

## 2020-05-02 ENCOUNTER — Ambulatory Visit: Payer: PPO

## 2020-05-24 ENCOUNTER — Ambulatory Visit (INDEPENDENT_AMBULATORY_CARE_PROVIDER_SITE_OTHER): Payer: PPO | Admitting: Family Medicine

## 2020-05-24 ENCOUNTER — Encounter: Payer: Self-pay | Admitting: Family Medicine

## 2020-05-24 ENCOUNTER — Other Ambulatory Visit: Payer: Self-pay

## 2020-05-24 VITALS — BP 122/80 | HR 56 | Temp 97.9°F | Resp 18 | Ht 70.0 in | Wt 178.0 lb

## 2020-05-24 DIAGNOSIS — Z9989 Dependence on other enabling machines and devices: Secondary | ICD-10-CM

## 2020-05-24 DIAGNOSIS — K219 Gastro-esophageal reflux disease without esophagitis: Secondary | ICD-10-CM

## 2020-05-24 DIAGNOSIS — Z85820 Personal history of malignant melanoma of skin: Secondary | ICD-10-CM | POA: Diagnosis not present

## 2020-05-24 DIAGNOSIS — E782 Mixed hyperlipidemia: Secondary | ICD-10-CM

## 2020-05-24 DIAGNOSIS — G250 Essential tremor: Secondary | ICD-10-CM | POA: Diagnosis not present

## 2020-05-24 DIAGNOSIS — N401 Enlarged prostate with lower urinary tract symptoms: Secondary | ICD-10-CM

## 2020-05-24 DIAGNOSIS — G609 Hereditary and idiopathic neuropathy, unspecified: Secondary | ICD-10-CM

## 2020-05-24 DIAGNOSIS — N138 Other obstructive and reflux uropathy: Secondary | ICD-10-CM

## 2020-05-24 DIAGNOSIS — E538 Deficiency of other specified B group vitamins: Secondary | ICD-10-CM

## 2020-05-24 DIAGNOSIS — G4733 Obstructive sleep apnea (adult) (pediatric): Secondary | ICD-10-CM | POA: Diagnosis not present

## 2020-05-24 DIAGNOSIS — M8949 Other hypertrophic osteoarthropathy, multiple sites: Secondary | ICD-10-CM | POA: Diagnosis not present

## 2020-05-24 DIAGNOSIS — Z0000 Encounter for general adult medical examination without abnormal findings: Secondary | ICD-10-CM | POA: Diagnosis not present

## 2020-05-24 DIAGNOSIS — M159 Polyosteoarthritis, unspecified: Secondary | ICD-10-CM

## 2020-05-24 DIAGNOSIS — M150 Primary generalized (osteo)arthritis: Secondary | ICD-10-CM

## 2020-05-24 LAB — COMPREHENSIVE METABOLIC PANEL
ALT: 20 U/L (ref 0–53)
AST: 19 U/L (ref 0–37)
Albumin: 4.4 g/dL (ref 3.5–5.2)
Alkaline Phosphatase: 63 U/L (ref 39–117)
BUN: 21 mg/dL (ref 6–23)
CO2: 32 mEq/L (ref 19–32)
Calcium: 9.2 mg/dL (ref 8.4–10.5)
Chloride: 103 mEq/L (ref 96–112)
Creatinine, Ser: 1.18 mg/dL (ref 0.40–1.50)
GFR: 59.34 mL/min — ABNORMAL LOW (ref >60.00)
Glucose, Bld: 100 mg/dL — ABNORMAL HIGH (ref 70–99)
Potassium: 4.3 mEq/L (ref 3.5–5.1)
Sodium: 139 mEq/L (ref 135–145)
Total Bilirubin: 0.5 mg/dL (ref 0.2–1.2)
Total Protein: 7 g/dL (ref 6.0–8.3)

## 2020-05-24 LAB — LIPID PANEL
Cholesterol: 165 mg/dL (ref 0–200)
HDL: 40.7 mg/dL (ref >39.00)
NonHDL: 123.85
Total CHOL/HDL Ratio: 4
Triglycerides: 202 mg/dL — ABNORMAL HIGH (ref 0.0–149.0)
VLDL: 40.4 mg/dL — ABNORMAL HIGH (ref 0.0–40.0)

## 2020-05-24 LAB — CBC WITH DIFFERENTIAL/PLATELET
Basophils Absolute: 0 10*3/uL (ref 0.0–0.1)
Basophils Relative: 0.7 % (ref 0.0–3.0)
Eosinophils Absolute: 0.1 10*3/uL (ref 0.0–0.7)
Eosinophils Relative: 2 % (ref 0.0–5.0)
HCT: 43.7 % (ref 39.0–52.0)
Hemoglobin: 14.8 g/dL (ref 13.0–17.0)
Lymphocytes Relative: 32.4 % (ref 12.0–46.0)
Lymphs Abs: 2.2 10*3/uL (ref 0.7–4.0)
MCHC: 33.8 g/dL (ref 30.0–36.0)
MCV: 90.3 fl (ref 78.0–100.0)
Monocytes Absolute: 0.6 10*3/uL (ref 0.1–1.0)
Monocytes Relative: 8.5 % (ref 3.0–12.0)
Neutro Abs: 3.8 10*3/uL (ref 1.4–7.7)
Neutrophils Relative %: 56.4 % (ref 43.0–77.0)
Platelets: 169 10*3/uL (ref 150.0–400.0)
RBC: 4.84 Mil/uL (ref 4.22–5.81)
RDW: 14.1 % (ref 11.5–15.5)
WBC: 6.7 10*3/uL (ref 4.0–10.5)

## 2020-05-24 LAB — VITAMIN B12: Vitamin B-12: 401 pg/mL (ref 211–911)

## 2020-05-24 LAB — TSH: TSH: 1.33 u[IU]/mL (ref 0.35–4.50)

## 2020-05-24 LAB — PSA: PSA: 1.14 ng/mL (ref 0.10–4.00)

## 2020-05-24 LAB — LDL CHOLESTEROL, DIRECT: Direct LDL: 93 mg/dL

## 2020-05-24 MED ORDER — GABAPENTIN 300 MG PO CAPS: ORAL_CAPSULE | ORAL | Status: DC

## 2020-05-24 NOTE — Progress Notes (Signed)
Subjective  Chief Complaint  Patient presents with  . Annual Exam    Fasting labs. He is only concerned about his arthritis.     HPI: Stephen Anderson is a 80 y.o. male who presents to [facility] [place] [place] today for a Male Wellness Visit. He also has the concerns and/or needs as listed above in the chief complaint. These will be addressed in addition to the Health Maintenance Visit.   Wellness Visit: annual visit with health maintenance review and exam    HM: screens and imms are up to date. Remains active. 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 Wt Readings from Last 3 Encounters:  05/24/20 178 lb (80.7 kg)  04/15/20 181 lb 6.4 oz (82.3 kg)  11/20/19 172 lb 3.2 oz (78.1 kg)     Chronic disease management visit and/or acute problem visit:  GERD cut back to once daily protonix and does fairly well. Will have occ sxs of reflux  OA hands hurts but voltaren gel and daily celebrex make it tolerable  OSA on cpap: stable. Sleeps well and awakens refreshed  HLD: tolerated statin. Due for recheck. Fasting.   b12 deficiency on supplements still taking  Tremor and neuropathy: on meds. Tolerates sxs. Not worsening.  BPH with nocturia once or twice nightly. Otherwise stable. Prefers not to add meds.   H/o melanoma w/ annual derm checks.   Patient Active Problem List   Diagnosis Date Noted  . Vitamin B12 deficiency 05/25/2019  . Screening for colorectal cancer 05/21/2019  . 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 History of melanoma 03/26/2018  . GERD (gastroesophageal reflux disease) 02/24/2018  . Mixed hyperlipidemia 02/24/2018  . 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 OSA on CPAP 12/30/2013  . Essential tremor 05/16/2012  . Peripheral neuropathy 05/16/2012   Health Maintenance  Topic Date Due  . COVID-19 Vaccine (3 - Pfizer risk 4-dose series) 06/22/2019  .  TETANUS/TDAP  07/09/2020  . PNA vac Low Risk Adult  Completed  . INFLUENZA VACCINE  Discontinued  . Hepatitis C Screening  Discontinued   Immunization History  Administered Date(s) Administered  . Fluad Quad(high Dose 65+) 01/14/2019  . PFIZER(Purple Top)SARS-COV-2 Vaccination 05/04/2019, 05/25/2019  . Pneumococcal Conjugate-13 10/21/2013  . Pneumococcal Polysaccharide-23 07/17/2010  . Td 01/07/2009  . Tdap 07/10/2010  . Zoster 03/16/2017  . Zoster Recombinat (Shingrix) 04/03/2017, 06/17/2017   We updated and reviewed the patient's past history in detail and it is documented below. Allergies: Patient is allergic to oxycontin [oxycodone hcl]. 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 Past Surgical History Patient  has a past surgical history that includes Gallbladder surgery; Hernia repair; Cataract extraction, bilateral; and Melanoma excision. Social History Patient  reports that he quit smoking about 12 years ago. His smoking use included cigarettes. He has a 50.00 pack-year smoking history. He quit smokeless tobacco use about 18 years ago. He reports that he does not drink alcohol and does not use drugs. Family History family history includes Bladder Cancer in his brother; Heart disease in his brother; Lung cancer in his father. Review of Systems: Constitutional: negative for fever or malaise Ophthalmic: negative for photophobia, double vision or loss of vision Cardiovascular: negative for chest pain, dyspnea on exertion, or  new LE swelling Respiratory: negative for SOB or persistent cough Gastrointestinal: negative for abdominal pain, change in bowel habits or melena Genitourinary: negative for dysuria or gross hematuria Musculoskeletal:  negative for new gait disturbance or muscular weakness Integumentary: negative for new or persistent rashes Neurological: negative for TIA or stroke symptoms Psychiatric: negative for SI or delusions Allergic/Immunologic: negative for hives  Patient Care Team    Relationship Specialty Notifications Start End  Leamon Arnt, MD PCP - General Family Medicine  02/24/18   Tat, Eustace Quail, DO Consulting Physician Neurology  02/24/18   Deneise Lever, MD Consulting Physician Pulmonary Disease  02/24/18   Ronne Binning  Dentistry  03/26/18   Sydnee Cabal, MD Consulting Physician Orthopedic Surgery  03/26/18   Evelina Bucy, DPM Consulting Physician Podiatry  03/26/18   Ronald Lobo, MD Consulting Physician Gastroenterology  03/26/18   Luberta Mutter, MD Consulting Physician Ophthalmology  03/26/18   Luberta Mutter, MD Consulting Physician Ophthalmology  03/20/19   Lavonna Monarch, MD Consulting Physician Dermatology  03/20/19   Warren Danes, PA-C Physician Assistant Dermatology  04/13/20    Objective  Vitals: BP 122/80   Temp 97.9 F (36.6 C) (Temporal)   Resp 18   Wt 178 lb (80.7 kg)   BMI 25.54 kg/m  General:  Well developed, well nourished, no acute distress  Psych:  Alert and orientedx3,normal mood and affect HEENT:  Normocephalic, atraumatic, non-icteric sclera, PERRL, oropharynx is clear without mass or exudate, supple neck without adenopathy, mass or thyromegaly Cardiovascular:  Normal S1, S2, RRR without gallop, rub or murmur, nondisplaced PMI, +2 distal pulses in bilateral upper and lower extremities. Respiratory:  Good breath sounds bilaterally, CTAB with normal respiratory effort Gastrointestinal: normal bowel sounds, soft, non-tender, no noted masses. No HSM MSK: no deformities, contusions. Joints are without erythema or swelling. Spine and CVA region are nontender Skin:  Warm, no rashes or suspicious lesions noted Neurologic:    Mental status is normal.  CN 2-11 are normal. Gross motor and sensory exams are normal. Stable gait. No tremor GU: No inguinal hernias or adenopathy are appreciated bilaterally   Assessment  1. Annual physical exam   2. Gastroesophageal reflux disease, unspecified whether esophagitis present   3. Vitamin B12 deficiency   4. Mixed hyperlipidemia   5. BPH with obstruction/lower urinary tract symptoms   6. Primary osteoarthritis involving multiple joints   7. OSA on CPAP   8. Idiopathic peripheral neuropathy   9. Essential tremor   10. History of melanoma      Plan  Male Wellness Visit:  Age appropriate Health Maintenance and Prevention measures were discussed with patient. Included topics are cancer screening recommendations, ways to keep healthy (see AVS) including dietary and exercise recommendations, regular eye and dental care, use of seat belts, and avoidance of moderate alcohol use and tobacco use.   BMI: discussed patient's BMI and encouraged positive lifestyle modifications to help get to or maintain a target BMI.  HM needs and immunizations were addressed and ordered. See below for orders. See HM and immunization section for updates.  Routine labs and screening tests ordered including cmp, cbc and lipids where appropriate.  Discussed recommendations regarding Vit D and calcium supplementation (see AVS)  Chronic disease f/u and/or acute problem visit: (deemed necessary to be done in addition to the wellness visit):  Chronic problems discussed individually as above. All are well controlled. Today will check lipids, lfts and psa. Renal function and cbc. Current medications as listed  at current doses listed on reviewed med list should be continued; can consider decreasing to prn celebrex and use tylenol daily to twice a day given GERD. Check b12 levels. Continue f/u with derm and neuro.   Follow up: 12 mo for cpe   Commons side effects, risks, benefits, and alternatives for medications and treatment plan  prescribed today were discussed, and the patient expressed understanding of the given instructions. Patient is instructed to call or message via MyChart if he/she has any questions or concerns regarding our treatment plan. No barriers to understanding were identified. We discussed Red Flag symptoms and signs in detail. Patient expressed understanding regarding what to do in case of urgent or emergency type symptoms.   Medication list was reconciled, printed and provided to the patient in AVS. Patient instructions and summary information was reviewed with the patient as documented in the AVS. This note was prepared with assistance of Dragon voice recognition software. Occasional wrong-word or sound-a-like substitutions may have occurred due to the inherent limitations of voice recognition software  This visit occurred during the SARS-CoV-2 public health emergency.  Safety protocols were in place, including screening questions prior to the visit, additional usage of staff PPE, and extensive cleaning of exam room while observing appropriate contact time as indicated for disinfecting solutions.   Orders Placed This Encounter  Procedures  . CBC with Differential/Platelet  . Comprehensive metabolic panel  . Lipid panel  . PSA  . TSH  . Vitamin B12   No orders of the defined types were placed in this encounter.

## 2020-05-24 NOTE — Patient Instructions (Signed)
Please return in 12 months for your annual complete physical; please come fasting.  I will release your lab results to you on your MyChart account with further instructions. Please reply with any questions.   You can try to cut back on the celebrex and use it as needed. Tylenol ES can replace it safely.   If you have any questions or concerns, please don't hesitate to send me a message via MyChart or call the office at [phone]. Thank you for visiting with [place] today! It's our pleasure caring for you.   Preventive Care 6 Years and Older, Male Preventive care refers to lifestyle choices and visits with your health care provider that can promote health and wellness. This includes:  A yearly physical exam. This is also called an annual wellness visit.  Regular dental and eye exams.  Immunizations.  Screening for certain conditions.  Healthy lifestyle choices, such as: ? Eating a healthy diet. ? Getting regular exercise. ? Not using drugs or products that contain nicotine and tobacco. ? Limiting alcohol use. What can I expect for my preventive care visit? Physical exam Your health care provider will check your:  Height and weight. These may be used to calculate your BMI (body mass index). BMI is a measurement that tells if you are at a healthy weight.  Heart rate and blood pressure.  Body temperature.  Skin for abnormal spots. Counseling Your health care provider may ask you questions about your:  Past medical problems.  Family's medical history.  Alcohol, tobacco, and drug use.  Emotional well-being.  Home life and relationship well-being.  Sexual activity.  Diet, exercise, and sleep habits.  History of falls.  Memory and ability to understand (cognition).  Work and work Statistician.  Access to firearms. What immunizations do I need? Vaccines are usually given at various ages, according to a schedule. Your health care provider will recommend vaccines for  you based on your age, medical history, and lifestyle or other factors, such as travel or where you work.   What tests do I need? Blood tests  Lipid and cholesterol levels. These may be checked every 5 years, or more often depending on your overall health.  Hepatitis C test.  Hepatitis B test. Screening  Lung cancer screening. You may have this screening every year starting at age 8 if you have a 30-pack-year history of smoking and currently smoke or have quit within the past 15 years.  Colorectal cancer screening. ? All adults should have this screening starting at age 63 and continuing until age 1. ? Your health care provider may recommend screening at age 40 if you are at increased risk. ? You will have tests every 1-10 years, depending on your results and the type of screening test.  Prostate cancer screening. Recommendations will vary depending on your family history and other risks.  Genital exam to check for testicular cancer or hernias.  Diabetes screening. ? This is done by checking your blood sugar (glucose) after you have not eaten for a while (fasting). ? You may have this done every 1-3 years.  Abdominal aortic aneurysm (AAA) screening. You may need this if you are a current or former smoker.  STD (sexually transmitted disease) testing, if you are at risk. Follow these instructions at home: Eating and drinking  Eat a diet that includes fresh fruits and vegetables, whole grains, lean protein, and low-fat dairy products. Limit your intake of foods with high amounts of sugar, saturated fats, and salt.  Take vitamin and mineral supplements as recommended by your health care provider.  Do not drink alcohol if your health care provider tells you not to drink.  If you drink alcohol: ? Limit how much you have to 0-2 drinks a day. ? Be aware of how much alcohol is in your drink. In the [place] one drink equals one 12 oz bottle of beer (355 mL), one 5 oz glass of wine (148  mL), or one 1 oz glass of hard liquor (44 mL).   Lifestyle  Take daily care of your teeth and gums. Brush your teeth every morning and night with fluoride toothpaste. Floss one time each day.  Stay active. Exercise for at least 30 minutes 5 or more days each week.  Do not use any products that contain nicotine or tobacco, such as cigarettes, e-cigarettes, and chewing tobacco. If you need help quitting, ask your health care provider.  Do not use drugs.  If you are sexually active, practice safe sex. Use a condom or other form of protection to prevent STIs (sexually transmitted infections).  Talk with your health care provider about taking a low-dose aspirin or statin.  Find healthy ways to cope with stress, such as: ? Meditation, yoga, or listening to music. ? Journaling. ? Talking to a trusted person. ? Spending time with friends and family. Safety  Always wear your seat belt while driving or riding in a vehicle.  Do not drive: ? If you have been drinking alcohol. Do not ride with someone who has been drinking. ? When you are tired or distracted. ? While texting.  Wear a helmet and other protective equipment during sports activities.  If you have firearms in your house, make sure you follow all gun safety procedures. What's next?  Visit your health care provider once a year for an annual wellness visit.  Ask your health care provider how often you should have your eyes and teeth checked.  Stay up to date on all vaccines. This information is not intended to replace advice given to you by your health care provider. Make sure you discuss any questions you have with your health care provider. Document Revised: 12/23/2018 Document Reviewed: 03/20/2018 Elsevier Patient Education  2021 [employer].

## 2020-05-26 NOTE — Progress Notes (Signed)
Please call patient: I have reviewed his/her lab results. All labs are stable. Things look good. No changes recommended at this time. Follow up as directed.

## 2020-05-30 DIAGNOSIS — G4733 Obstructive sleep apnea (adult) (pediatric): Secondary | ICD-10-CM | POA: Diagnosis not present

## 2020-05-31 ENCOUNTER — Encounter: Payer: Self-pay | Admitting: Internal Medicine

## 2020-06-01 NOTE — Progress Notes (Signed)
Subjective:    Patient ID: Stephen Anderson, male    DOB: 20-Apr-1940, 80 y.o.   MRN: [id number]  HPI male former smoker  followed for OSA complicated by peripheral neuropathy, essential tremor NPSG 11/2013  AHI 21/ hr, titrated to 8 cwp  -----------------------------------------------------------   04/24/19- 78 yomale former smoker followed for OSA, Insomnia,  complicated by peripheral neuropathy, essential tremor, GERD, BPH,  CPAP auto 5-15/Adapt Download compliance 100%, AHI 10.8/ hr Body weight today 186 lbs -----f/u OSA. Patient stated breathing is at his baseline.  Caffeine 200 mg tab, primidone, gabapentin, doxylamine for sleep Pleased with the way his replacement machine has been working. Sometimes takes it off in last hour of early morning to doze without it. Wife tells him then he snores and has apneas, so he understands. That may contribute to residual apneas. He describes hard work, Theatre manager.  Sleeps soundly at night x for nocturia. CXR 01/14/2019- Trachea is midline. Heart size normal. Lungs appear hyperinflated but clear. Biapical pleural thickening. No pleural fluid. IMPRESSION: Hyperinflation without acute finding.  06/02/20- 80 yo male former smoker followed for OSA, Insomnia,  complicated by peripheral neuropathy, essential tremor, GERD, BPH,  CPAP auto 5-15/Adapt -Caffeine 200 mg tab, primidone, gabapentin, doxylamine for sleep    Caffeine tabs Download- compliance 100%, AHI 12.1/ hr   Breakthrough events "unknown" type apneas Body weight today-183 lbs Covid vax- Flu vax-  He volunteers that he "likes' his CPAP and couldn't live without it. Seems to sleep well with no correlation with breakthrough AHI. Machine is not using its full available range, peaking at 9.1cwp.   He "works hard and plays hard", doing some fairly active outdoor work and also going to gym for treadmill and weights 3x/ week.  Complains of peripheral neuropathy and arthritis  making uncomfortable at night. Takes 1/2 caffeine tab each morning. Biotene rinse for dry mouth with Full-face mask.    ROS-see HPI    "+" = positive Constitutional:    weight loss, night sweats, fevers, chills, fatigue, lassitude. HEENT:    headaches, difficulty swallowing, tooth/dental problems, sore throat,       sneezing, itching, ear ache, nasal congestion, post nasal drip, snoring CV:    chest pain, orthopnea, PND, swelling in lower extremities, anasarca,                                                dizziness, palpitations Resp:   shortness of breath with exertion or at rest.                productive cough,   non-productive cough, coughing up of blood.              change in color of mucus.  wheezing.   Skin:    rash or lesions. GI:  No-   heartburn, indigestion, abdominal pain, nausea, vomiting, diarrhea,                 change in bowel habits, loss of appetite GU: dysuria, change in color of urine, no urgency or frequency.   flank pain. MS:   +joint pain, stiffness, decreased range of motion, back pain. Neuro-    + symptoms of peripheral neuropathy both lower legs. Psych:  change in mood or affect.  depression or anxiety.   memory loss.    Objective:  OBJ-  Physical Exam   General- Alert, Oriented, Affect-appropriate, Distress- none acute, fit-appearing/ not obese Skin- rash-none, lesions- none, excoriation- none Lymphadenopathy- none Head- atraumatic            Eyes- Gross vision intact, PERRLA, conjunctivae and secretions clear            Ears- +Hard of hearing            Nose- Clear, no-Septal dev, mucus, polyps, erosion, perforation             Throat- Mallampati II , mucosa clear , drainage- none, tonsils- atrophic Neck- flexible , trachea midline, no stridor , thyroid nl, carotid no bruit Chest - symmetrical excursion , unlabored           Heart/CV- RRR , no murmur , no gallop  , no rub, nl s1 s2                           - JVD- none , edema- none, stasis changes-  none, varices- none           Lung- clear to P&A, wheeze- none, cough- none , dullness-none, rub- none           Chest wall-  Abd-  Br/ Gen/ Rectal- Not done, not indicated Extrem- cyanosis- none, clubbing, none, atrophy- none, strength- nl Neuro- grossly intact to observation  Assessment & Plan:

## 2020-06-02 ENCOUNTER — Encounter: Payer: Self-pay | Admitting: Internal Medicine

## 2020-06-02 ENCOUNTER — Other Ambulatory Visit: Payer: Self-pay

## 2020-06-02 ENCOUNTER — Ambulatory Visit: Payer: PPO | Admitting: Internal Medicine

## 2020-06-02 DIAGNOSIS — Z9989 Dependence on other enabling machines and devices: Secondary | ICD-10-CM

## 2020-06-02 DIAGNOSIS — F5101 Primary insomnia: Secondary | ICD-10-CM | POA: Diagnosis not present

## 2020-06-02 DIAGNOSIS — G4733 Obstructive sleep apnea (adult) (pediatric): Secondary | ICD-10-CM | POA: Diagnosis not present

## 2020-06-02 NOTE — Patient Instructions (Signed)
We can continue CPAP auto 5-15 ° °Please call if we can help °

## 2020-06-03 NOTE — Assessment & Plan Note (Signed)
Benefits from CPAP. Pressure setting seems appropriate. I don't think we need to retitrate for the residual events. Plan- continue auto 5-15

## 2020-06-03 NOTE — Assessment & Plan Note (Signed)
Gabapentin and doxylamine seem to help with sleep. Main problem now is somatic discomfort in joints and feet from neuropathy and arthritis.  Wee discussed and plan n changes for now.

## 2020-06-13 ENCOUNTER — Ambulatory Visit: Payer: PPO | Admitting: Family Medicine

## 2020-06-13 ENCOUNTER — Telehealth: Payer: Self-pay

## 2020-06-13 NOTE — Telephone Encounter (Signed)
Wife states pt is experiencing terrible shoulder pain. She asked if he could be seen wed, thurs, or fri. No open slots. Please advise how to schedule

## 2020-06-14 NOTE — Telephone Encounter (Signed)
Patient's wife called in saying Stephen Anderson was in a lot of pain with his shoulder, did advise patient we did not have anything with Dr.Andy. Offered an appointment with someone else but wife stated he does not want to see anyone other than Dr.Andy.

## 2020-06-15 ENCOUNTER — Encounter: Payer: Self-pay | Admitting: Family Medicine

## 2020-06-15 ENCOUNTER — Ambulatory Visit (INDEPENDENT_AMBULATORY_CARE_PROVIDER_SITE_OTHER): Payer: PPO

## 2020-06-15 ENCOUNTER — Other Ambulatory Visit: Payer: Self-pay

## 2020-06-15 ENCOUNTER — Ambulatory Visit (INDEPENDENT_AMBULATORY_CARE_PROVIDER_SITE_OTHER): Payer: PPO | Admitting: Family Medicine

## 2020-06-15 VITALS — BP 120/68 | HR 111 | Temp 98.3°F | Wt 184.8 lb

## 2020-06-15 DIAGNOSIS — M67911 Unspecified disorder of synovium and tendon, right shoulder: Secondary | ICD-10-CM

## 2020-06-15 DIAGNOSIS — M19011 Primary osteoarthritis, right shoulder: Secondary | ICD-10-CM | POA: Diagnosis not present

## 2020-06-15 MED ORDER — TRIAMCINOLONE ACETONIDE 40 MG/ML IJ SUSP
40.0000 mg | Freq: Once | INTRAMUSCULAR | Status: AC
  Administered 2020-06-15: 40 mg via INTRA_ARTICULAR

## 2020-06-15 NOTE — Progress Notes (Signed)
Subjective  CC:  Chief Complaint  Patient presents with  . Shoulder Pain    Right shoulder, started back in August while doing yard work. Progressively has gotten worse. Has taken Tramadol 50 mg and methocarbamol 500 mg PRN with little relief    HPI: Stephen Anderson is a 80 y.o. male who presents to the office today to address the problems listed above in the chief complaint.  80 year old male who is very active reports he injured his shoulder back in August while doing yard work.  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  He remembers pulling vigorously on something and felt sharp pain in the right shoulder.  He used medications at that time.  Worked hard until November.  Rested during the month of November and symptoms improved, however they have returned.  He notices pain when he tries to reach behind him to put on his shirt or grab things behind him.  He has pain with overhead work and cannot lift overhead without pain.  He denies radicular symptoms, neck pain or weakness in the hand.   Assessment  1. Disorder of right rotator cuff      Plan   Likely rotator cuff tear or arthropathy: Gave steroid injection today.  Check shoulder x-ray for arthritis.  Discussed expectations of steroid injection.  Recommend ice and rest for the next 2 weeks.  Follow-up if not improved.  May warrant an MRI or referral.  Follow up: If not improving Visit date not found  Orders Placed This Encounter  Procedures  . DG Shoulder Right   Meds ordered this encounter  Medications  . triamcinolone acetonide (KENALOG-40) injection 40 mg      I reviewed the patients updated PMH, FH, and SocHx.    Patient Active Problem List   Diagnosis Date Noted  . Vitamin B12 deficiency 05/25/2019  . Screening for colorectal cancer 05/21/2019  . 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 History of melanoma 03/26/2018  . GERD (gastroesophageal reflux disease) 02/24/2018  . Mixed hyperlipidemia  02/24/2018  . 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 OSA on CPAP 12/30/2013  . Essential tremor 05/16/2012  . Peripheral neuropathy 05/16/2012   Current Meds  Medication Sig  . Acetaminophen (TYLENOL ARTHRITIS EXT RELIEF PO) Take 1 tablet by mouth every other day.  Marland Kitchen aspirin 81 MG tablet Take 81 mg by mouth every other day.   Marland Kitchen atorvastatin (LIPITOR) 40 MG tablet Take 1 tablet (40 mg total) by mouth at bedtime.  . caffeine 200 MG TABS tablet Take 200 mg by mouth daily. 1/2 every morning  . celecoxib (CELEBREX) 200 MG capsule Take 1 capsule (200 mg total) by mouth daily.  . Cyanocobalamin (B-12 PO) Take 1 tablet by mouth daily.  . diclofenac Sodium (VOLTAREN) 1 % GEL apply sparingly to affected areas up to twice a day  . Emollient (AQUAPHOR EX) Apply topically in the morning and at bedtime.  . fluticasone (FLONASE) 50 MCG/ACT nasal spray Place 1 spray into both nostrils daily.  Marland Kitchen gabapentin (NEURONTIN) 300 MG capsule Take 1 capsule (300 mg total) by mouth in the morning AND 2 capsules (600 mg total) at bedtime.  . NON FORMULARY CPAP  . pantoprazole (PROTONIX) 40 MG tablet Take 1 tablet (40 mg total) by mouth daily.  . primidone (MYSOLINE) 50 MG tablet TAKE ONE TABLET BY MOUTH EVERY MORNING AND ONE TABLET IN THE EVENING  Allergies: Patient is allergic to oxycontin [oxycodone hcl]. Family History: Patient family history includes Bladder Cancer in his brother; Heart disease in his brother; Lung cancer in his father. Social History:  Patient  reports that he quit smoking about 12 years ago. His smoking use included cigarettes. He has a 50.00 pack-year smoking history. He quit smokeless tobacco use about 18 years ago. He reports that he does not drink alcohol and does not use drugs.  Review of Systems: Constitutional: Negative for fever malaise or anorexia Cardiovascular: negative for chest pain Respiratory: negative  for SOB or persistent cough Gastrointestinal: negative for abdominal pain  Objective  Vitals: BP 120/68   Pulse (!) 111   Temp 98.3 F (36.8 C) (Temporal)   Wt 184 lb 12.8 oz (83.8 kg)   SpO2 92%   BMI 26.52 kg/m  General: no acute distress , A&Ox3 Right shoulder appears normal: Lateral tenderness present, mildly decreased abduction, positive empty can testing, positive impingement sign, normal internal rotation and abduction.  Shoulder Injection Procedure Note  Pre-operative Diagnosis: right shoulder  Post-operative Diagnosis: same  Indications: Symptomatic relief and failed conservative measures  Anesthesia: Lidocaine 1% without epinephrine without added sodium bicarbonate  Procedure Details   Verbal consent was obtained for the procedure. The shoulder was prepped with alcohol  and the skin was anesthetized with cold spray. Using a 22 gauge needle the subacromial space was injected with 4 mL 1% lidocaine and 1 mL of triamcinolone (KENALOG) 40mg /ml under the posterior aspect of the acromion. The injection site was cleansed with topical isopropyl alcohol and a dressing was applied.  Complications:  None; patient tolerated the procedure well.     Commons side effects, risks, benefits, and alternatives for medications and treatment plan prescribed today were discussed, and the patient expressed understanding of the given instructions. Patient is instructed to call or message via MyChart if he/she has any questions or concerns regarding our treatment plan. No barriers to understanding were identified. We discussed Red Flag symptoms and signs in detail. Patient expressed understanding regarding what to do in case of urgent or emergency type symptoms.   Medication list was reconciled, printed and provided to the patient in AVS. Patient instructions and summary information was reviewed with the patient as documented in the AVS. This note was prepared with assistance of Dragon voice  recognition software. Occasional wrong-word or sound-a-like substitutions may have occurred due to the inherent limitations of voice recognition software  This visit occurred during the SARS-CoV-2 public health emergency.  Safety protocols were in place, including screening questions prior to the visit, additional usage of staff PPE, and extensive cleaning of exam room while observing appropriate contact time as indicated for disinfecting solutions.

## 2020-06-15 NOTE — Telephone Encounter (Signed)
Patient scheduled for today

## 2020-06-15 NOTE — Patient Instructions (Signed)
Please follow up if symptoms do not improve or as needed.   You had a steroid injection today.   Things to be aware of after this injection are listed below:  You may experience no significant improvement or even a slight worsening in your symptoms during the first 24 to 48 hours.  After that we expect your symptoms to improve gradually over the next 2 weeks for the medicine to have its maximal effect.  You should continue to have improvement out to 6 weeks after your injection.  I recommend icing the site of the injection for 20 minutes  1-2 times the day of your injection  You may shower but no swimming, tub bath or Jacuzzi for 24 hours.  If your bandage falls off this does not need to be replaced.  It is appropriate to remove the bandage after 4 hours.  You may resume light activities as tolerated.     POSSIBLE PROCEDURE SIDE EFFECTS: The side effects of the injection are usually fairly minimal however if you may experience some of the following side effects that are usually self-limited and will is off on their own.  If you are concerned please feel free to call the office with questions:             Increased numbness or tingling             Nausea or vomiting             Swelling or bruising at the injection site    Please call our office if if you experience any of the following symptoms over the next week as these can be signs of infection:              Fever greater than 100.5F             Significant swelling at the injection site             Significant redness or drainage from the injection site      

## 2020-06-17 NOTE — Progress Notes (Signed)
Please call patient: I have reviewed his/her lab results. Xray shows mild arthritis in the shoulder where it meets the clavicle, but the shoulder joint itself is fine. Hopefully the steroid injection and rest will help. Return if not improving.

## 2020-06-22 ENCOUNTER — Ambulatory Visit: Payer: PPO | Admitting: Dermatology

## 2020-06-22 ENCOUNTER — Telehealth: Payer: Self-pay

## 2020-06-22 NOTE — Telephone Encounter (Signed)
Patient calling back about xrays.

## 2020-06-22 NOTE — Telephone Encounter (Signed)
Results given to patient. View result note

## 2020-07-15 ENCOUNTER — Other Ambulatory Visit: Payer: Self-pay | Admitting: Family Medicine

## 2020-07-18 ENCOUNTER — Other Ambulatory Visit: Payer: Self-pay

## 2020-07-18 ENCOUNTER — Encounter: Payer: Self-pay | Admitting: Dermatology

## 2020-07-18 ENCOUNTER — Ambulatory Visit: Payer: PPO | Admitting: Dermatology

## 2020-07-18 DIAGNOSIS — L570 Actinic keratosis: Secondary | ICD-10-CM

## 2020-07-21 ENCOUNTER — Ambulatory Visit: Payer: PPO

## 2020-07-26 ENCOUNTER — Encounter: Payer: Self-pay | Admitting: Dermatology

## 2020-07-26 NOTE — Progress Notes (Signed)
   Follow-Up Visit   Subjective  Stephen Anderson is a 80 y.o. male who presents for the following: Follow-up (Here for follow up PDT - "good peel").  Actinic keratoses Location: Face Duration:  Quality:  Associated Signs/Symptoms: Modifying Factors: PDT Severity:  Timing: Context:   Objective  Well appearing patient in no apparent distress; mood and affect are within normal limits. Objective  Left Frontal Scalp (3), Left Temporal Scalp (5), Right Temporal Scalp (3): PDT produced a good amount of redness and peeling, with perhaps 50% reduction in the number of clinical actinic keratoses.  The thicker ones will be treated with freezing today and he may return in the fall or winter to see if topical fluorouracil (Tolak) is indicated.    A focused examination was performed including Head and neck.. Relevant physical exam findings are noted in the Assessment and Plan.   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 Denna Haggard, in the winter patient is to come back for Tolak. Debatable on doing another PDT.   Destruction of lesion - Left Frontal Scalp, Left Temporal Scalp, Right Temporal Scalp Complexity: simple   Destruction method: cryotherapy   Informed consent: discussed and consent obtained   Timeout:  patient name, date of birth, surgical site, and procedure verified Lesion destroyed using liquid nitrogen: Yes   Cryotherapy cycles:  5 Outcome: patient tolerated procedure well with no complications   Post-procedure details: wound care instructions given        I, Lavonna Monarch, MD, have reviewed all documentation for this visit.  The documentation on 07/26/20 for the exam, diagnosis, procedures, and orders are all accurate and complete.

## 2020-08-02 DIAGNOSIS — G4733 Obstructive sleep apnea (adult) (pediatric): Secondary | ICD-10-CM | POA: Diagnosis not present

## 2020-08-29 ENCOUNTER — Other Ambulatory Visit: Payer: Self-pay

## 2020-08-29 ENCOUNTER — Encounter: Payer: Self-pay | Admitting: Family Medicine

## 2020-08-29 ENCOUNTER — Ambulatory Visit (INDEPENDENT_AMBULATORY_CARE_PROVIDER_SITE_OTHER): Payer: PPO | Admitting: Family Medicine

## 2020-08-29 ENCOUNTER — Ambulatory Visit (INDEPENDENT_AMBULATORY_CARE_PROVIDER_SITE_OTHER): Payer: PPO

## 2020-08-29 VITALS — BP 126/74 | HR 61 | Temp 98.3°F | Ht 70.0 in | Wt 178.4 lb

## 2020-08-29 DIAGNOSIS — G609 Hereditary and idiopathic neuropathy, unspecified: Secondary | ICD-10-CM | POA: Diagnosis not present

## 2020-08-29 DIAGNOSIS — M67911 Unspecified disorder of synovium and tendon, right shoulder: Secondary | ICD-10-CM | POA: Diagnosis not present

## 2020-08-29 DIAGNOSIS — M8949 Other hypertrophic osteoarthropathy, multiple sites: Secondary | ICD-10-CM | POA: Diagnosis not present

## 2020-08-29 DIAGNOSIS — M1712 Unilateral primary osteoarthritis, left knee: Secondary | ICD-10-CM | POA: Diagnosis not present

## 2020-08-29 DIAGNOSIS — M159 Polyosteoarthritis, unspecified: Secondary | ICD-10-CM

## 2020-08-29 DIAGNOSIS — M170 Bilateral primary osteoarthritis of knee: Secondary | ICD-10-CM | POA: Diagnosis not present

## 2020-08-29 DIAGNOSIS — M1711 Unilateral primary osteoarthritis, right knee: Secondary | ICD-10-CM | POA: Diagnosis not present

## 2020-08-29 MED ORDER — GABAPENTIN 300 MG PO CAPS: 600.0000 mg | ORAL_CAPSULE | Freq: Two times a day (BID) | ORAL | Status: DC

## 2020-08-29 MED ORDER — DICLOFENAC SODIUM 75 MG PO TBEC: 75.0000 mg | DELAYED_RELEASE_TABLET | Freq: Two times a day (BID) | ORAL | 0 refills | Status: DC

## 2020-08-29 NOTE — Patient Instructions (Addendum)
Please follow up if symptoms do not improve or as needed.   I have referred you to orthopedics to evaluate your shoulder and knee pain.   Please stop the celebrex and use the diclofenac twice a day with food for 1-2 weeks for the shoulder pain. Then you may restart the celebrex.

## 2020-08-29 NOTE — Progress Notes (Signed)
Subjective  CC:  Chief Complaint  Patient presents with  . Shoulder Pain    Right shoulder pain, has had steroid shot done before.  . Knee Pain    Both knees, has neuropathy. Worsening     HPI: Stephen Anderson is a 80 y.o. male who presents to the office today to address the problems listed above in the chief complaint.  Right shoulder pain.  Pain persisting.  Started back in August, 2021.  Overuse injury.  Treated with steroid injection in March.  He did well with this.  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  Describes dull ache.  No weakness.  No new symptoms.  X-rays were done at the time that showed mild AC joint DJD but no other abnormalities.  Has bilateral knee arthritis.  Has had steroid injections in the past but has been multiple years.  Having more aching at night.  This is keeping him awake.  No swelling or redness or warmth.  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  He also uses Voltaren gel.  Idiopathic peripheral neuropathy on gabapentin 300 mg every morning and 600 p.m.  He has been on this for several years.  Reports that his numbness and tingling are becoming more bothersome.  Interfering with sleep.   Assessment  1. Disorder of right rotator cuff   2. Primary osteoarthritis involving multiple joints   3. Bilateral primary osteoarthritis of knee   4. Idiopathic peripheral neuropathy      Plan   Right shoulder pain: Suspect rotator cuff tendinopathy or other.  Given persistent pain, duration of symptoms and need to be able to use the shoulder for his work, refer to orthopedics for further evaluation.  1 week course of Voltaren 75 bid. Hold Celebrex in the meantime.  Knee osteoarthritis: Recheck x-rays.  Refer to orthopedics.  Idiopathic peripheral neuropathy currently active.  Titrate up gabapentin.  Will titrate up slowly, increase to 600 twice daily.  May  be able to tolerate higher dose.  Follow up: As needed Visit date not found  Orders Placed This Encounter  Procedures  . DG Knee AP/LAT W/Sunrise Left  . DG Knee AP/LAT W/Sunrise Right  . Ambulatory referral to Orthopedic Surgery   Meds ordered this encounter  Medications  . diclofenac (VOLTAREN) 75 MG EC tablet    Sig: Take 1 tablet (75 mg total) by mouth 2 (two) times daily.    Dispense:  30 tablet    Refill:  0  . gabapentin (NEURONTIN) 300 MG capsule    Sig: Take 2 capsules (600 mg total) by mouth 2 (two) times daily.      I reviewed the patients updated PMH, FH, and SocHx.    Patient Active Problem List   Diagnosis Date Noted  . Vitamin B12 deficiency 05/25/2019  . Screening for colorectal cancer 05/21/2019  . 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 History of melanoma 03/26/2018  . GERD (gastroesophageal reflux disease) 02/24/2018  . Mixed hyperlipidemia 02/24/2018  . 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 OSA on CPAP 12/30/2013  . Essential tremor 05/16/2012  . Peripheral neuropathy 05/16/2012   Current Meds  Medication Sig  . Acetaminophen (TYLENOL ARTHRITIS EXT RELIEF PO) Take 1 tablet by mouth every other day.  Marland Kitchen aspirin 81 MG tablet Take 81 mg by mouth every other day.   Marland Kitchen atorvastatin (  LIPITOR) 40 MG tablet Take 1 tablet (40 mg total) by mouth at bedtime.  . caffeine 200 MG TABS tablet Take 200 mg by mouth daily. 1/2 every morning  . celecoxib (CELEBREX) 200 MG capsule Take 1 capsule (200 mg total) by mouth daily.  . Cyanocobalamin (B-12 PO) Take 1 tablet by mouth daily.  . diclofenac (VOLTAREN) 75 MG EC tablet Take 1 tablet (75 mg total) by mouth 2 (two) times daily.  . diclofenac Sodium (VOLTAREN) 1 % GEL apply sparingly to affected areas up to twice a day  . Emollient (AQUAPHOR EX) Apply topically in the morning and at bedtime.  . fluticasone (FLONASE) 50 MCG/ACT nasal spray Place 1 spray into both  nostrils daily.  . NON FORMULARY CPAP  . pantoprazole (PROTONIX) 40 MG tablet Take 1 tablet (40 mg total) by mouth daily.  . primidone (MYSOLINE) 50 MG tablet TAKE ONE TABLET BY MOUTH EVERY MORNING AND ONE TABLET EVERY EVENING  . [DISCONTINUED] gabapentin (NEURONTIN) 300 MG capsule Take 1 capsule (300 mg total) by mouth in the morning AND 2 capsules (600 mg total) at bedtime.    Allergies: Patient is allergic to oxycontin [oxycodone hcl]. Family History: Patient family history includes Bladder Cancer in his brother; Heart disease in his brother; Lung cancer in his father. Social History:  Patient  reports that he quit smoking about 12 years ago. His smoking use included cigarettes. He has a 50.00 pack-year smoking history. He quit smokeless tobacco use about 18 years ago. He reports that he does not drink alcohol and does not use drugs.  Review of Systems: Constitutional: Negative for fever malaise or anorexia Cardiovascular: negative for chest pain Respiratory: negative for SOB or persistent cough Gastrointestinal: negative for abdominal pain  Objective  Vitals: BP 126/74   Pulse 61   Temp 98.3 F (36.8 C) (Temporal)   Ht 5\' 10"  (1.778 m)   Wt 178 lb 6.4 oz (80.9 kg)   SpO2 96%   BMI 25.60 kg/m  General: no acute distress , A&Ox3 Knees: No effusion, redness or warmth.  Full range of motion.  No crepitus.  Tenderness Right shoulder: Positive empty can test otherwise full range of motion.  Nontender    Commons side effects, risks, benefits, and alternatives for medications and treatment plan prescribed today were discussed, and the patient expressed understanding of the given instructions. Patient is instructed to call or message via MyChart if he/she has any questions or concerns regarding our treatment plan. No barriers to understanding were identified. We discussed Red Flag symptoms and signs in detail. Patient expressed understanding regarding what to do in case of urgent or  emergency type symptoms.   Medication list was reconciled, printed and provided to the patient in AVS. Patient instructions and summary information was reviewed with the patient as documented in the AVS. This note was prepared with assistance of Dragon voice recognition software. Occasional wrong-word or sound-a-like substitutions may have occurred due to the inherent limitations of voice recognition software  This visit occurred during the SARS-CoV-2 public health emergency.  Safety protocols were in place, including screening questions prior to the visit, additional usage of staff PPE, and extensive cleaning of exam room while observing appropriate contact time as indicated for disinfecting solutions.

## 2020-09-15 DIAGNOSIS — M25511 Pain in right shoulder: Secondary | ICD-10-CM | POA: Insufficient documentation

## 2020-09-15 DIAGNOSIS — M170 Bilateral primary osteoarthritis of knee: Secondary | ICD-10-CM | POA: Insufficient documentation

## 2020-09-15 DIAGNOSIS — M25561 Pain in right knee: Secondary | ICD-10-CM | POA: Insufficient documentation

## 2020-09-15 DIAGNOSIS — M25562 Pain in left knee: Secondary | ICD-10-CM | POA: Diagnosis not present

## 2020-09-15 DIAGNOSIS — M1711 Unilateral primary osteoarthritis, right knee: Secondary | ICD-10-CM | POA: Insufficient documentation

## 2020-10-13 DIAGNOSIS — M7541 Impingement syndrome of right shoulder: Secondary | ICD-10-CM | POA: Diagnosis not present

## 2020-10-17 ENCOUNTER — Other Ambulatory Visit: Payer: Self-pay

## 2020-10-17 ENCOUNTER — Other Ambulatory Visit: Payer: Self-pay | Admitting: Family Medicine

## 2020-10-17 MED ORDER — DICLOFENAC SODIUM 75 MG PO TBEC: 75.0000 mg | DELAYED_RELEASE_TABLET | Freq: Two times a day (BID) | ORAL | 0 refills | Status: DC

## 2020-11-15 DIAGNOSIS — Z961 Presence of intraocular lens: Secondary | ICD-10-CM | POA: Diagnosis not present

## 2020-11-15 DIAGNOSIS — H52203 Unspecified astigmatism, bilateral: Secondary | ICD-10-CM | POA: Diagnosis not present

## 2020-11-15 DIAGNOSIS — D23112 Other benign neoplasm of skin of right lower eyelid, including canthus: Secondary | ICD-10-CM | POA: Diagnosis not present

## 2020-11-16 ENCOUNTER — Encounter: Payer: Self-pay | Admitting: Physician Assistant

## 2020-11-16 ENCOUNTER — Ambulatory Visit: Payer: PPO | Admitting: Physician Assistant

## 2020-11-16 ENCOUNTER — Other Ambulatory Visit: Payer: Self-pay

## 2020-11-16 DIAGNOSIS — L218 Other seborrheic dermatitis: Secondary | ICD-10-CM | POA: Diagnosis not present

## 2020-11-16 DIAGNOSIS — D2372 Other benign neoplasm of skin of left lower limb, including hip: Secondary | ICD-10-CM

## 2020-11-16 DIAGNOSIS — L570 Actinic keratosis: Secondary | ICD-10-CM | POA: Diagnosis not present

## 2020-11-16 DIAGNOSIS — D485 Neoplasm of uncertain behavior of skin: Secondary | ICD-10-CM

## 2020-11-16 DIAGNOSIS — Z1283 Encounter for screening for malignant neoplasm of skin: Secondary | ICD-10-CM

## 2020-11-16 DIAGNOSIS — L821 Other seborrheic keratosis: Secondary | ICD-10-CM

## 2020-11-16 MED ORDER — KETOCONAZOLE 2 % EX SHAM: MEDICATED_SHAMPOO | CUTANEOUS | 5 refills | Status: DC

## 2020-11-16 MED ORDER — FLUOROURACIL 5 % EX CREA: TOPICAL_CREAM | CUTANEOUS | 0 refills | Status: DC

## 2020-11-16 NOTE — Patient Instructions (Signed)

## 2020-11-16 NOTE — Progress Notes (Signed)
Follow-Up Visit   Subjective  Stephen Anderson is a 80 y.o. 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 Family history of non mole skin cancers but no melanoma. ).   The following portions of the chart were reviewed this encounter and updated as appropriate:  Tobacco  Allergies  Meds  Problems  Med Hx  Surg Hx  Fam Hx      Objective  Well appearing patient in no apparent distress; mood and affect are within normal limits.  All skin waist up examined.  Mid Parietal Scalp Pink scale  Right Temporal Scalp Hyperkeratotic scale with pink base        Right Zygomatic Area Hyperkeratotic scale with pink base        Left Lower Leg - Anterior Hyperkeratotic scale with pink base        Right Parietal Scalp Erythematous patches with gritty scale.   Assessment & Plan  Other seborrheic dermatitis Mid Parietal Scalp  ketoconazole (NIZORAL) 2 % shampoo - Mid Parietal Scalp Apply to affected area- let sit 3-5 minutes before rinsing  Neoplasm of uncertain behavior of skin (3) Right Temporal Scalp  Skin / nail biopsy Type of biopsy: tangential   Informed consent: discussed and consent obtained   Timeout: patient name, date of birth, surgical site, and procedure verified   Procedure prep:  Patient was prepped and draped in usual sterile fashion (Non sterile) Prep type:  Chlorhexidine Anesthesia: the lesion was anesthetized in a standard fashion   Anesthetic:  1% lidocaine w/ epinephrine 1-100,000 local infiltration Instrument used: flexible razor blade   Outcome: patient tolerated procedure well   Post-procedure details: wound care instructions given    Specimen 1 - Surgical pathology Differential Diagnosis: bcc vs scc  Check Margins: No  Right  Zygomatic Area  Skin / nail biopsy Type of biopsy: tangential   Informed consent: discussed and consent obtained   Timeout: patient name, date of birth, surgical site, and procedure verified   Procedure prep:  Patient was prepped and draped in usual sterile fashion (Non sterile) Prep type:  Chlorhexidine Anesthesia: the lesion was anesthetized in a standard fashion   Anesthetic:  1% lidocaine w/ epinephrine 1-100,000 local infiltration Instrument used: flexible razor blade   Outcome: patient tolerated procedure well   Post-procedure details: wound care instructions given    Specimen 2 - Surgical pathology Differential Diagnosis: bcc vs scc  Check Margins: No  Left Lower Leg - Anterior  Skin / nail biopsy Type of biopsy: tangential   Informed consent: discussed and consent obtained   Timeout: patient name, date of birth, surgical site, and procedure verified   Procedure prep:  Patient was prepped and draped in usual sterile fashion (Non sterile) Prep type:  Chlorhexidine Anesthesia: the lesion was anesthetized in a standard fashion   Anesthetic:  1% lidocaine w/ epinephrine 1-100,000 local infiltration Instrument used: flexible razor blade   Outcome: patient tolerated procedure well   Post-procedure details: wound care instructions given    Specimen 3 - Surgical pathology Differential Diagnosis: bcc vs scc  Check Margins: No  AK (actinic keratosis) Right Parietal Scalp  fluorouracil (EFUDEX) 5 % cream - Right Parietal Scalp Apply to affected area qhs  Monday-Sunday x 2 weeks- AVOID SUN  I, Bernd Crom, PA-C, have reviewed all documentation's for this visit.  The documentation on 11/16/20 for the exam, diagnosis, procedures and orders are all accurate and complete.

## 2020-11-29 ENCOUNTER — Telehealth: Payer: Self-pay | Admitting: *Deleted

## 2020-11-29 NOTE — Telephone Encounter (Signed)
-----   Message from Warren Danes, [place] sent at 11/29/2020  9:43 AM EDT ----- Mohs for AMP

## 2020-11-29 NOTE — Telephone Encounter (Signed)
Left message to call for pathology results.

## 2020-11-29 NOTE — Telephone Encounter (Signed)
Pathology to patient- will send referral for MOHS

## 2020-11-29 NOTE — Telephone Encounter (Signed)
Mohs info entered into proficient. Waiting for patient to call back before we send over referral.

## 2020-11-29 NOTE — Telephone Encounter (Signed)
[facility] - informed to scheduled after september 30th due to patients going out of town.

## 2020-12-01 ENCOUNTER — Ambulatory Visit: Payer: PPO | Admitting: Family Medicine

## 2020-12-19 ENCOUNTER — Ambulatory Visit (INDEPENDENT_AMBULATORY_CARE_PROVIDER_SITE_OTHER): Payer: PPO | Admitting: Family Medicine

## 2020-12-19 ENCOUNTER — Telehealth: Payer: Self-pay

## 2020-12-19 ENCOUNTER — Other Ambulatory Visit: Payer: Self-pay

## 2020-12-19 ENCOUNTER — Encounter: Payer: Self-pay | Admitting: Family Medicine

## 2020-12-19 VITALS — BP 128/84 | HR 64 | Temp 98.7°F | Ht 70.0 in | Wt 171.0 lb

## 2020-12-19 DIAGNOSIS — G250 Essential tremor: Secondary | ICD-10-CM | POA: Diagnosis not present

## 2020-12-19 DIAGNOSIS — G609 Hereditary and idiopathic neuropathy, unspecified: Secondary | ICD-10-CM | POA: Diagnosis not present

## 2020-12-19 DIAGNOSIS — E538 Deficiency of other specified B group vitamins: Secondary | ICD-10-CM | POA: Diagnosis not present

## 2020-12-19 DIAGNOSIS — Z9989 Dependence on other enabling machines and devices: Secondary | ICD-10-CM

## 2020-12-19 DIAGNOSIS — L237 Allergic contact dermatitis due to plants, except food: Secondary | ICD-10-CM

## 2020-12-19 DIAGNOSIS — G4733 Obstructive sleep apnea (adult) (pediatric): Secondary | ICD-10-CM | POA: Diagnosis not present

## 2020-12-19 LAB — B12 AND FOLATE PANEL
Folate: >24.4 ng/mL (ref >5.9)
Vitamin B-12: >1550 pg/mL — ABNORMAL HIGH (ref 211–911)

## 2020-12-19 LAB — MAGNESIUM: Magnesium: 2.3 mg/dL (ref 1.5–2.5)

## 2020-12-19 LAB — VITAMIN D 25 HYDROXY (VIT D DEFICIENCY, FRACTURES): VITD: 21.92 ng/mL — ABNORMAL LOW (ref 30.00–100.00)

## 2020-12-19 MED ORDER — GABAPENTIN 300 MG PO CAPS: ORAL_CAPSULE | ORAL | Status: DC

## 2020-12-19 NOTE — Telephone Encounter (Signed)
Patient is needing a referral to DR.TAT's office neurologist specializes in neuropathy  this was discussed in appt on 9/12 just needing a new referral

## 2020-12-19 NOTE — Progress Notes (Signed)
Subjective  CC:  Chief Complaint  Patient presents with   Peripheral Neuropathy    HPI: Stephen Anderson is a 80 y.o. male who presents to the office today to address the problems listed above in the chief complaint. 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 Now worsening. Reviewed old records. Has dense neuropathy. Now with worsening burning pain on the bottom of his feet. Has numbness up to knees. Has been able to tolerate gabapentin 300 q am and 600 at night. Using an otc oil that helps as well (eucalyptus based). Has h/o vit b12 defic now on supplements. No foot sores. No claudication. Has trouble staying away if not moving though and worries gabapentin dose increase will worsen this. He does also take primidone for essential tremor and a caffeine tab in the am to help keep him awake. Tremor is controlled. He has not seen Dr. Christain [place] in several years. He has OSA on CPAP managed by pulmonology. He is compliant.  Poison oak: on legs from working outside this weekend. Itching and some red spots.    Assessment  1. Idiopathic peripheral neuropathy   2. Poison oak   3. Essential tremor   4. OSA on CPAP   5. Vitamin B12 deficiency      Plan  Neuropathy, worsening:  will try to titrate up dose of gabapentin: see avs for instruction. Will push up to 600qam, 300 in afternoon, and 900 at night if tolerated. Also rec seeing neurology for further management strategies. No wounds or sores. No vascular insufficiency.  Tremor on primidoen. Continue caffeine tab OSA on cpap; per pulmo Recheck b12 and vits.  Follow up: as scheduled. For cpe  05/25/2021  Orders Placed This Encounter  Procedures   B12 and Folate Panel   Magnesium   VITAMIN D 25 Hydroxy (Vit-D Deficiency, Fractures)   Meds ordered this encounter  Medications   gabapentin (NEURONTIN) 300 MG capsule    Sig: Take 2 tablets in the morning, 1 tablet in the afternoon, and 3  tablets every evening.      I reviewed the patients updated PMH, FH, and SocHx.    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   aspirin 81 MG tablet Take 81 mg by mouth every other day.    atorvastatin (LIPITOR) 40 MG tablet Take 1 tablet (40 mg total) by mouth at bedtime.   caffeine 200 MG TABS tablet Take 200 mg by mouth daily. 1/2 every morning   celecoxib (CELEBREX) 200 MG capsule Take 1 capsule (200 mg total) by mouth daily.   Cyanocobalamin (B-12 PO) Take 1 tablet by  mouth daily.   diclofenac Sodium (VOLTAREN) 1 % GEL apply sparingly to affected areas up to twice a day   Emollient (AQUAPHOR EX) Apply topically in the morning and at bedtime.   fluticasone (FLONASE) 50 MCG/ACT nasal spray Place 1 spray into both nostrils daily.   ketoconazole (NIZORAL) 2 % shampoo Apply to affected area- let sit 3-5 minutes before rinsing   NON FORMULARY CPAP   pantoprazole (PROTONIX) 40 MG tablet Take 1 tablet (40 mg total) by mouth daily.   primidone (MYSOLINE) 50 MG tablet TAKE ONE TABLET BY MOUTH EVERY MORNING AND ONE TABLET EVERY EVENING   [DISCONTINUED] gabapentin (NEURONTIN) 300 MG capsule Take 2 capsules (600 mg total) by mouth 2 (two) times  daily.    Allergies: Patient is allergic to oxycontin [oxycodone hcl]. Family History: Patient family history includes Bladder Cancer in his brother; Heart disease in his brother; Lung cancer in his father. Social History:  Patient  reports that he quit smoking about 12 years ago. His smoking use included cigarettes. He has a 50.00 pack-year smoking history. He quit smokeless tobacco use about 18 years ago. He reports that he does not drink alcohol and does not use drugs.  Review of Systems: Constitutional: Negative for fever malaise or anorexia Cardiovascular: negative for chest pain Respiratory: negative for SOB or persistent cough Gastrointestinal: negative for abdominal pain  Objective  Vitals: BP 128/84   Pulse 64   Temp 98.7 F (37.1 C) (Temporal)   Ht '5\' 10"'$  (1.778 m)   Wt 171 lb (77.6 kg)   SpO2 96%   BMI 24.54 kg/m  General: no acute distress , A&Ox3 Neuro: LE with dense decrease in sensation to knees bilateral. Decreased vibratory sensation to knee cap. Nl distal pulses Skin:  Warm, mild erythematous rash with papules, linear on left inner lower leg    Commons side effects, risks, benefits, and alternatives for medications and treatment plan prescribed today were discussed, and the patient expressed understanding of the given instructions. Patient is instructed to call or message via MyChart if he/she has any questions or concerns regarding our treatment plan. No barriers to understanding were identified. We discussed Red Flag symptoms and signs in detail. Patient expressed understanding regarding what to do in case of urgent or emergency type symptoms.  Medication list was reconciled, printed and provided to the patient in AVS. Patient instructions and summary information was reviewed with the patient as documented in the AVS. This note was prepared with assistance of Dragon voice recognition software. Occasional wrong-word or sound-a-like substitutions may have occurred  due to the inherent limitations of voice recognition software  This visit occurred during the SARS-CoV-2 public health emergency.  Safety protocols were in place, including screening questions prior to the visit, additional usage of staff PPE, and extensive cleaning of exam room while observing appropriate contact time as indicated for disinfecting solutions.

## 2020-12-19 NOTE — Patient Instructions (Signed)
Please follow up as scheduled for your next visit with me: 05/25/2021   We will increase the dose of your gabapentin; start by adding a pill in the afternoon and evening to your current dose; then, after 2-4 weeks, add another pill to your am dose so you will eventually be taking: 2 pills in the morning, 1 pill in the afternoon and 3 pills at night.   I'm hoping this will calm down your pain.   If you have any questions or concerns, please don't hesitate to send me a message via MyChart or call the office at [phone]. Thank you for visiting with [place] today! It's our pleasure caring for you.

## 2020-12-20 ENCOUNTER — Other Ambulatory Visit: Payer: Self-pay

## 2020-12-20 DIAGNOSIS — G250 Essential tremor: Secondary | ICD-10-CM

## 2020-12-20 DIAGNOSIS — G609 Hereditary and idiopathic neuropathy, unspecified: Secondary | ICD-10-CM

## 2020-12-20 MED ORDER — VITAMIN D (ERGOCALCIFEROL) 1.25 MG (50000 UNIT) PO CAPS: 50000.0000 [IU] | ORAL_CAPSULE | ORAL | 0 refills | Status: AC

## 2020-12-20 NOTE — Telephone Encounter (Signed)
Referral has been placed. 

## 2020-12-20 NOTE — Telephone Encounter (Signed)
Okay to order?

## 2020-12-20 NOTE — Addendum Note (Signed)
Addended by: Billey Chang on: 12/20/2020 12:32 PM   Modules accepted: Orders

## 2020-12-20 NOTE — Progress Notes (Signed)
Please call patient: I have reviewed his/her lab results. Labs look good overall. Can cut back to qod dosing of the vit B12 as it is now high. Vit D is a little low and getting this level up towards 60 may help his leg pain a bit. I've ordered a RX supplement to take weekly for 12 weeks. Can also take 2000 units otc daily. No other changes are needed at this time.

## 2020-12-21 ENCOUNTER — Encounter: Payer: Self-pay | Admitting: Neurology

## 2020-12-29 DIAGNOSIS — L570 Actinic keratosis: Secondary | ICD-10-CM | POA: Diagnosis not present

## 2021-01-03 NOTE — Progress Notes (Signed)
Assessment/Plan:   1.  Essential Tremor.  -on primidone 50 mg bid -likely the gabapentin is helping, since he finds that the tremor has gotten better recently.    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  He is currently on gabapentin, 600 mg 3 times per day.  He is also using a topical oil that is over-the-counter twice daily.  He is happy with this current regimen.  -Patient does not really want to change anything.  If neuropathy becomes worse in the future, we discussed the addition of duloxetine.  Right now, he does not want to add or change anything.  -Patient does state that the worst thing right now is balance and he cannot climb on ladders.  Discussed balance therapy, but he does not want to do that right now.  He can let us know if he changes his mind.  3.  Follow-up as needed.   Subjective:   Stephen Anderson was seen in consultation in the movement disorder clinic at the request of Leamon Arnt, MD.  The evaluation is for tremor.  This patient is not new to me, but I have not seen him in over 3 years.  I saw him from 2014 until 2019 for essential tremor and neuropathy.  At that point in time, he was on primidone, 50 mg twice per day.  He was also on low-dose gabapentin for peripheral neuropathy (work-up for reversible causes unremarkable).  That dose has since been increased to gabapentin, 300 mg, 2 in the morning/1 in the afternoon/3 at bedtime but he is actually taking 600 mg tid (was 900 mg per day and now 1800 mg per day).  Its helping significantly compared to previous along with a topical oil he is using bid.  He also finds that if he walks barefoot, his knees will hurt.  He is more sleepy when he sits and relax with the increased dosage.  Tremor is doing well per patient.  He thinks that the bid primidone is helping.  Something has "changed that it has gotten  better."    Current/Previously tried tremor medications: Primidone, 50 mg twice per day; gabapentin 300 mg, 2 tablets in the morning, 1 in the afternoon and 3 in the evening (he does 600mg  in the AM and dinner and bed)   Outside reports reviewed: historical medical records, office notes, and referral letter/letters.  Allergies  Allergen Reactions   Oxycontin [Oxycodone Hcl] Hives    Current Outpatient Medications  Medication Instructions   Acetaminophen (TYLENOL ARTHRITIS EXT RELIEF PO) 1 tablet, Oral, Every other day   aspirin 81 mg, Oral, Every other day   atorvastatin (LIPITOR) 40 mg, Oral, Daily at bedtime   celecoxib (CELEBREX) 200 mg, Oral, Daily   Cyanocobalamin (B-12 PO) 1 tablet, Oral, Daily   fluticasone (FLONASE) 50 MCG/ACT nasal spray 1 spray, Each Nare, Daily   gabapentin (NEURONTIN) 300 MG capsule Take 2 tablets in the morning, 1 tablet in the afternoon, and 3 tablets every evening.   ketoconazole (NIZORAL) 2 % shampoo Apply to affected area- let sit 3-5 minutes before rinsing   NON FORMULARY CPAP    pantoprazole (PROTONIX) 40 mg, Oral, Daily   primidone (MYSOLINE) 50 MG tablet TAKE ONE TABLET BY MOUTH EVERY MORNING AND ONE TABLET EVERY EVENING   Vitamin D (Ergocalciferol) (DRISDOL) 50,000 Units, Oral, Weekly     Objective:   VITALS:  Vitals:   01/10/21 0845  BP: 125/79  Pulse: 81  SpO2: 98%  Weight: 174 lb 6.4 oz (79.1 kg)  Height: 5\' 10"  (1.778 m)   Gen:  Appears stated age and in NAD. HEENT:  Normocephalic, atraumatic. The mucous membranes are moist. The superficial temporal arteries are without ropiness or tenderness. Cardiovascular: Regular rate and rhythm. Lungs: Clear to auscultation bilaterally. Neck: There are no carotid bruits noted bilaterally.  NEUROLOGICAL:  Orientation:  The patient is alert and oriented x 3.   Cranial nerves: There is good facial symmetry. Extraocular muscles are intact and visual fields are full to confrontational  testing. Speech is fluent and clear.  Hearing is intact to conversational tone. Tone: Tone is good throughout. Sensation: Sensation is intact to light touch touch throughout (facial, trunk, extremities). Vibration is decreased distally. There is no extinction with double simultaneous stimulation. There is no sensory dermatomal level identified. Coordination:  The patient has no dysdiadichokinesia or dysmetria. Motor: Strength is 5/5 in the bilateral upper and lower extremities.  Shoulder shrug is equal bilaterally.  There is no pronator drift.  There are no fasciculations noted. Gait and Station: The patient is able to ambulate without difficulty.  He is just slightly wide-based, but otherwise he ambulates normal  MOVEMENT EXAM: Tremor:  There is no rest tremor.  There is no postural tremor today.  He is able to draw Archimedes spirals pretty well.  I have reviewed and interpreted the following labs independently   Chemistry      Component Value Date/Time   NA 139 05/24/2020 0817   NA 143 09/25/2017 0000   K 4.3 05/24/2020 0817   CL 103 05/24/2020 0817   CO2 32 05/24/2020 0817   BUN 21 05/24/2020 0817   BUN 19 09/25/2017 0000   CREATININE 1.18 05/24/2020 0817   GLU 88 09/25/2017 0000      Component Value Date/Time   CALCIUM 9.2 05/24/2020 0817   ALKPHOS 63 05/24/2020 0817   AST 19 05/24/2020 0817   ALT 20 05/24/2020 0817   BILITOT 0.5 05/24/2020 0817      Lab Results  Component Value Date   WBC 6.7 05/24/2020   HGB 14.8 05/24/2020   HCT 43.7 05/24/2020   MCV 90.3 05/24/2020   PLT 169.0 05/24/2020   Lab Results  Component Value Date   TSH 1.33 05/24/2020     CC:  Leamon Arnt, MD

## 2021-01-10 ENCOUNTER — Other Ambulatory Visit: Payer: Self-pay

## 2021-01-10 ENCOUNTER — Ambulatory Visit: Payer: PPO | Admitting: Neurology

## 2021-01-10 ENCOUNTER — Encounter: Payer: Self-pay | Admitting: Neurology

## 2021-01-10 VITALS — BP 125/79 | HR 81 | Ht 70.0 in | Wt 174.4 lb

## 2021-01-10 DIAGNOSIS — G250 Essential tremor: Secondary | ICD-10-CM

## 2021-01-10 DIAGNOSIS — G609 Hereditary and idiopathic neuropathy, unspecified: Secondary | ICD-10-CM | POA: Diagnosis not present

## 2021-01-10 NOTE — Patient Instructions (Signed)
We discussed adding duloxetine but decided to hold off on that.  Peripheral Neuropathy Peripheral neuropathy is a type of nerve damage. It affects nerves that carry signals between the spinal cord and the arms, legs, and the rest of the body (peripheral nerves). It does not affect nerves in the spinal cord or brain. In peripheral neuropathy, one nerve or a group of nerves may be damaged. Peripheral neuropathy is a broad category that includes many specific nerve disorders, like diabetic neuropathy, hereditary neuropathy, and carpal tunnel syndrome. What are the causes? This condition may be caused by: Diabetes. This is the most common cause of peripheral neuropathy. Nerve injury. Pressure or stress on a nerve that lasts a long time. Lack (deficiency) of B vitamins. This can result from alcoholism, poor diet, or a restricted diet. Infections. Autoimmune diseases, such as rheumatoid arthritis and systemic lupus erythematosus. Nerve diseases that are passed from parent to child (inherited). Some medicines, such as cancer medicines (chemotherapy). Poisonous (toxic) substances, such as lead and mercury. Too little blood flowing to the legs. Kidney disease. Thyroid disease. In some cases, the cause of this condition is not known. What are the signs or symptoms? Symptoms of this condition depend on which of your nerves is damaged. Common symptoms include: Loss of feeling (numbness) in the feet, hands, or both. Tingling in the feet, hands, or both. Burning pain. Very sensitive skin. Weakness. Not being able to move a part of the body (paralysis). Muscle twitching. Clumsiness or poor coordination. Loss of balance. Not being able to control your bladder. Feeling dizzy. Sexual problems. How is this diagnosed? Diagnosing and finding the cause of peripheral neuropathy can be difficult. Your health care provider will take your medical history and do a physical exam. A neurological exam will also  be done. This involves checking things that are affected by your brain, spinal cord, and nerves (nervous system). For example, your health care provider will check your reflexes, how you move, and what you can feel. You may have other tests, such as: Blood tests. Electromyogram (EMG) and nerve conduction tests. These tests check nerve function and how well the nerves are controlling the muscles. Imaging tests, such as CT scans or MRI to rule out other causes of your symptoms. Removing a small piece of nerve to be examined in a lab (nerve biopsy). Removing and examining a small amount of the fluid that surrounds the brain and spinal cord (lumbar puncture). How is this treated? Treatment for this condition may involve: Treating the underlying cause of the neuropathy, such as diabetes, kidney disease, or vitamin deficiencies. Stopping medicines that can cause neuropathy, such as chemotherapy. Medicine to help relieve pain. Medicines may include: Prescription or over-the-counter pain medicine. Antiseizure medicine. Antidepressants. Pain-relieving patches that are applied to painful areas of skin. Surgery to relieve pressure on a nerve or to destroy a nerve that is causing pain. Physical therapy to help improve movement and balance. Devices to help you move around (assistive devices). Follow these instructions at home: Medicines Take over-the-counter and prescription medicines only as told by your health care provider. Do not take any other medicines without first asking your health care provider. Do not drive or use heavy machinery while taking prescription pain medicine. Lifestyle  Do not use any products that contain nicotine or tobacco, such as cigarettes and e-cigarettes. Smoking keeps blood from reaching damaged nerves. If you need help quitting, ask your health care provider. Avoid or limit alcohol. Too much alcohol can cause a vitamin  B deficiency, and vitamin B is needed for healthy  nerves. Eat a healthy diet. This includes: Eating foods that are high in fiber, such as fresh fruits and vegetables, whole grains, and beans. Limiting foods that are high in fat and processed sugars, such as fried or sweet foods. General instructions  If you have diabetes, work closely with your health care provider to keep your blood sugar under control. If you have numbness in your feet: Check every day for signs of injury or infection. Watch for redness, warmth, and swelling. Wear padded socks and comfortable shoes. These help protect your feet. Develop a good support system. Living with peripheral neuropathy can be stressful. Consider talking with a mental health specialist or joining a support group. Use assistive devices and attend physical therapy as told by your health care provider. This may include using a walker or a cane. Keep all follow-up visits as told by your health care provider. This is important. Contact a health care provider if: You have new signs or symptoms of peripheral neuropathy. You are struggling emotionally from dealing with peripheral neuropathy. Your pain is not well-controlled. Get help right away if: You have an injury or infection that is not healing normally. You develop new weakness in an arm or leg. You have fallen or do so frequently. Summary Peripheral neuropathy is when the nerves in the arms, or legs are damaged, resulting in numbness, weakness, or pain. There are many causes of peripheral neuropathy, including diabetes, pinched nerves, vitamin deficiencies, autoimmune disease, and hereditary conditions. Diagnosing and finding the cause of peripheral neuropathy can be difficult. Your health care provider will take your medical history, do a physical exam, and do tests, including blood tests and nerve function tests. Treatment involves treating the underlying cause of the neuropathy and taking medicines to help control pain. Physical therapy and  assistive devices may also help. This information is not intended to replace advice given to you by your health care provider. Make sure you discuss any questions you have with your health care provider. Document Revised: 01/05/2020 Document Reviewed: 01/05/2020 Elsevier Patient Education  2022 [employer].

## 2021-01-24 ENCOUNTER — Other Ambulatory Visit: Payer: Self-pay | Admitting: Family Medicine

## 2021-02-04 ENCOUNTER — Other Ambulatory Visit: Payer: Self-pay | Admitting: Family Medicine

## 2021-03-31 DIAGNOSIS — S0101XA Laceration without foreign body of scalp, initial encounter: Secondary | ICD-10-CM | POA: Diagnosis not present

## 2021-04-05 DIAGNOSIS — Z4802 Encounter for removal of sutures: Secondary | ICD-10-CM | POA: Diagnosis not present

## 2021-05-02 ENCOUNTER — Other Ambulatory Visit: Payer: Self-pay | Admitting: Family Medicine

## 2021-05-03 ENCOUNTER — Other Ambulatory Visit: Payer: Self-pay

## 2021-05-03 MED ORDER — PRIMIDONE 50 MG PO TABS: ORAL_TABLET | ORAL | 3 refills | Status: DC

## 2021-05-20 ENCOUNTER — Other Ambulatory Visit: Payer: Self-pay | Admitting: Family Medicine

## 2021-05-25 ENCOUNTER — Encounter: Payer: PPO | Admitting: Family Medicine

## 2021-05-25 ENCOUNTER — Telehealth: Payer: Self-pay | Admitting: Family Medicine

## 2021-05-25 NOTE — Telephone Encounter (Signed)
Fine by me!  Thanks,  Rich

## 2021-05-25 NOTE — Telephone Encounter (Signed)
Pt is wanting to [facility] his care to [facility], he states that the [place] office is closer to him.   Please advise if the [facility] is ok with the both of you.

## 2021-05-31 NOTE — Progress Notes (Signed)
Subjective:    Patient ID: Stephen Anderson, male    DOB: Apr 22, 1940, 81 y.o.   MRN: [id number]  HPI male former smoker  followed for OSA complicated by peripheral neuropathy, essential tremor NPSG 11/2013  AHI 21/ hr, titrated to 8 cwp  -----------------------------------------------------------   06/02/20- 81 yo male former smoker followed for OSA, Insomnia,  complicated by peripheral neuropathy, essential tremor, GERD, BPH,  CPAP auto 5-15/Adapt -Caffeine 200 mg tab, primidone, gabapentin, doxylamine for sleep    Caffeine tabs  Download- compliance 100%, AHI 12.1/ hr   Breakthrough events "unknown" type apneas Body weight today-183 lbs Covid vax- Flu vax-  He volunteers that he "likes' his CPAP and couldn't live without it. Seems to sleep well with no correlation with breakthrough AHI. Machine is not using its full available range, peaking at 9.1cwp.   He "works hard and plays hard", doing some fairly active outdoor work and also going to gym for treadmill and weights 3x/ week.  Complains of peripheral neuropathy and arthritis making uncomfortable at night. Takes 1/2 caffeine tab each morning. Biotene rinse for dry mouth with Full-face mask.    06/01/21-  81 yo male former smoker followed for OSA, Insomnia,  complicated by peripheral neuropathy, essential tremor, GERD, BPH,  CPAP auto 5-15/Adapt -Caffeine 200 mg tab, primidone, gabapentin, doxylamine for sleep     Download- compliance 97%, AHI 8.9/ hr     pressure range 5-8 Body weight today-178 lbs Covid vax-3 Phizer Flu vax-no Download reviewed.  Residual AHI is a little higher than ideal but it I do not see that CPAP adjustment would change this.  There are a few central apneas.  He is very comfortable with his CPAP machine which is mechanically working quite well. Breathing is comfortable and he has avoided COVID infection. His most active concern is peripheral neuropathy which is being worked on.  He mentions history of having  had "melanomas" removed from back and temples without active disease now-I see reference to squamous cell carcinomas  ROS-see HPI    "+" = positive Constitutional:    weight loss, night sweats, fevers, chills, fatigue, lassitude. HEENT:    headaches, difficulty swallowing, tooth/dental problems, sore throat,       sneezing, itching, ear ache, nasal congestion, post nasal drip, snoring CV:    chest pain, orthopnea, PND, swelling in lower extremities, anasarca,                                                dizziness, palpitations Resp:   shortness of breath with exertion or at rest.                productive cough,   non-productive cough, coughing up of blood.              change in color of mucus.  wheezing.   Skin:    rash or lesions. GI:  No-   heartburn, indigestion, abdominal pain, nausea, vomiting, diarrhea,                 change in bowel habits, loss of appetite GU: dysuria, change in color of urine, no urgency or frequency.   flank pain. MS:   +joint pain, stiffness, decreased range of motion, back pain. Neuro-    + symptoms of peripheral neuropathy both lower legs. Psych:  change in mood  or affect.  depression or anxiety.   memory loss.    Objective:  OBJ- Physical Exam   General- Alert, Oriented, Affect-appropriate, Distress- none acute, fit-appearing/ not obese Skin- rash-none, lesions- none, excoriation- none Lymphadenopathy- none Head- atraumatic            Eyes- Gross vision intact, PERRLA, conjunctivae and secretions clear            Ears- +Hard of hearing            Nose- Clear, no-Septal dev, mucus, polyps, erosion, perforation             Throat- Mallampati II , mucosa clear , drainage- none, tonsils- atrophic Neck- flexible , trachea midline, no stridor , thyroid nl, carotid no bruit Chest - symmetrical excursion , unlabored           Heart/CV- RRR , no murmur , no gallop  , no rub, nl s1 s2                           - JVD- none , edema- none, stasis changes- none,  varices- none           Lung- clear to P&A, wheeze- none, cough- none , dullness-none, rub- none           Chest wall-  Abd-  Br/ Gen/ Rectal- Not done, not indicated Extrem- cyanosis- none, clubbing, none, atrophy- none, strength- nl Neuro- grossly intact to observation  Assessment & Plan:

## 2021-06-01 ENCOUNTER — Encounter: Payer: Self-pay | Admitting: Internal Medicine

## 2021-06-01 ENCOUNTER — Ambulatory Visit: Payer: PPO | Admitting: Internal Medicine

## 2021-06-01 ENCOUNTER — Other Ambulatory Visit: Payer: Self-pay

## 2021-06-01 DIAGNOSIS — G4733 Obstructive sleep apnea (adult) (pediatric): Secondary | ICD-10-CM | POA: Diagnosis not present

## 2021-06-01 DIAGNOSIS — G609 Hereditary and idiopathic neuropathy, unspecified: Secondary | ICD-10-CM | POA: Diagnosis not present

## 2021-06-01 DIAGNOSIS — Z9989 Dependence on other enabling machines and devices: Secondary | ICD-10-CM

## 2021-06-01 NOTE — Assessment & Plan Note (Signed)
He benefits from CPAP with good compliance. Plan-continue auto 5-15

## 2021-06-01 NOTE — Assessment & Plan Note (Signed)
He continues to follow with neurology.

## 2021-06-01 NOTE — Telephone Encounter (Signed)
Pt is scheduled °

## 2021-06-01 NOTE — Patient Instructions (Signed)
We can continue CPAP auto 5-15 ° °Please call if we can help °

## 2021-06-06 ENCOUNTER — Encounter: Payer: PPO | Admitting: Family Medicine

## 2021-06-27 ENCOUNTER — Ambulatory Visit (INDEPENDENT_AMBULATORY_CARE_PROVIDER_SITE_OTHER): Payer: PPO | Admitting: Registered Nurse

## 2021-06-27 ENCOUNTER — Other Ambulatory Visit: Payer: Self-pay

## 2021-06-27 ENCOUNTER — Encounter: Payer: Self-pay | Admitting: Registered Nurse

## 2021-06-27 VITALS — BP 146/71 | HR 98 | Temp 97.7°F | Resp 18 | Ht 70.0 in | Wt 179.0 lb

## 2021-06-27 DIAGNOSIS — E782 Mixed hyperlipidemia: Secondary | ICD-10-CM | POA: Diagnosis not present

## 2021-06-27 DIAGNOSIS — J302 Other seasonal allergic rhinitis: Secondary | ICD-10-CM | POA: Diagnosis not present

## 2021-06-27 DIAGNOSIS — Z8639 Personal history of other endocrine, nutritional and metabolic disease: Secondary | ICD-10-CM

## 2021-06-27 DIAGNOSIS — G609 Hereditary and idiopathic neuropathy, unspecified: Secondary | ICD-10-CM | POA: Diagnosis not present

## 2021-06-27 DIAGNOSIS — M159 Polyosteoarthritis, unspecified: Secondary | ICD-10-CM

## 2021-06-27 DIAGNOSIS — G250 Essential tremor: Secondary | ICD-10-CM

## 2021-06-27 DIAGNOSIS — K219 Gastro-esophageal reflux disease without esophagitis: Secondary | ICD-10-CM

## 2021-06-27 LAB — COMPREHENSIVE METABOLIC PANEL
ALT: 13 U/L (ref 0–53)
AST: 18 U/L (ref 0–37)
Albumin: 4.6 g/dL (ref 3.5–5.2)
Alkaline Phosphatase: 82 U/L (ref 39–117)
BUN: 20 mg/dL (ref 6–23)
CO2: 29 mEq/L (ref 19–32)
Calcium: 9.2 mg/dL (ref 8.4–10.5)
Chloride: 102 mEq/L (ref 96–112)
Creatinine, Ser: 1.14 mg/dL (ref 0.40–1.50)
GFR: 61.37 mL/min (ref >60.00)
Glucose, Bld: 96 mg/dL (ref 70–99)
Potassium: 4.6 mEq/L (ref 3.5–5.1)
Sodium: 138 mEq/L (ref 135–145)
Total Bilirubin: 0.4 mg/dL (ref 0.2–1.2)
Total Protein: 7.1 g/dL (ref 6.0–8.3)

## 2021-06-27 LAB — CBC WITH DIFFERENTIAL/PLATELET
Basophils Absolute: 0 10*3/uL (ref 0.0–0.1)
Basophils Relative: 0.5 % (ref 0.0–3.0)
Eosinophils Absolute: 0.1 10*3/uL (ref 0.0–0.7)
Eosinophils Relative: 1.3 % (ref 0.0–5.0)
HCT: 40.4 % (ref 39.0–52.0)
Hemoglobin: 13.1 g/dL (ref 13.0–17.0)
Lymphocytes Relative: 17.3 % (ref 12.0–46.0)
Lymphs Abs: 1.6 10*3/uL (ref 0.7–4.0)
MCHC: 32.5 g/dL (ref 30.0–36.0)
MCV: 85 fl (ref 78.0–100.0)
Monocytes Absolute: 0.8 10*3/uL (ref 0.1–1.0)
Monocytes Relative: 9.1 % (ref 3.0–12.0)
Neutro Abs: 6.4 10*3/uL (ref 1.4–7.7)
Neutrophils Relative %: 71.8 % (ref 43.0–77.0)
Platelets: 193 10*3/uL (ref 150.0–400.0)
RBC: 4.75 Mil/uL (ref 4.22–5.81)
RDW: 14.8 % (ref 11.5–15.5)
WBC: 9 10*3/uL (ref 4.0–10.5)

## 2021-06-27 LAB — LIPID PANEL
Cholesterol: 149 mg/dL (ref 0–200)
HDL: 45.6 mg/dL (ref >39.00)
LDL Cholesterol: 64 mg/dL (ref 0–99)
NonHDL: 103.58
Total CHOL/HDL Ratio: 3
Triglycerides: 199 mg/dL — ABNORMAL HIGH (ref 0.0–149.0)
VLDL: 39.8 mg/dL (ref 0.0–40.0)

## 2021-06-27 LAB — VITAMIN D 25 HYDROXY (VIT D DEFICIENCY, FRACTURES): VITD: 25.37 ng/mL — ABNORMAL LOW (ref 30.00–100.00)

## 2021-06-27 LAB — T4, FREE: Free T4: 0.77 ng/dL (ref 0.60–1.60)

## 2021-06-27 LAB — B12 AND FOLATE PANEL
Folate: 9.3 ng/mL (ref >5.9)
Vitamin B-12: 599 pg/mL (ref 211–911)

## 2021-06-27 LAB — TSH: TSH: 1.54 u[IU]/mL (ref 0.35–5.50)

## 2021-06-27 LAB — HEMOGLOBIN A1C: Hgb A1c MFr Bld: 6 % (ref 4.6–6.5)

## 2021-06-27 LAB — SEDIMENTATION RATE: Sed Rate: 15 mm/hr (ref 0–20)

## 2021-06-27 MED ORDER — PANTOPRAZOLE SODIUM 40 MG PO TBEC: 40.0000 mg | DELAYED_RELEASE_TABLET | Freq: Every day | ORAL | 3 refills | Status: DC

## 2021-06-27 MED ORDER — PREGABALIN 150 MG PO CAPS: 150.0000 mg | ORAL_CAPSULE | Freq: Two times a day (BID) | ORAL | 0 refills | Status: DC

## 2021-06-27 MED ORDER — FLUTICASONE PROPIONATE 50 MCG/ACT NA SUSP: 1.0000 | Freq: Every day | NASAL | 6 refills | Status: DC

## 2021-06-27 MED ORDER — PRIMIDONE 50 MG PO TABS: ORAL_TABLET | ORAL | 3 refills | Status: DC

## 2021-06-27 MED ORDER — CELECOXIB 200 MG PO CAPS: 200.0000 mg | ORAL_CAPSULE | Freq: Every day | ORAL | 3 refills | Status: DC

## 2021-06-27 NOTE — Progress Notes (Signed)
? ?New Patient Office Visit ? ?Subjective:  ?Patient ID: Stephen Anderson, male    DOB: March 27, 1941  Age: 81 y.o. MRN: [id number] ? ?CC:  ?Chief Complaint  ?Patient presents with  ? Transitions Of Care  ?  Patient states he is is here for a TOC. Patient states he has been having pain all over that he feels like its just not arthritis   ? ? ?HPI ?Stephen Anderson presents for [facility] ? ?Histories reviewed and updated with patient.  ? ?Hx of melanomas ?Face, and back ?Follows with derm. ? ?Pain ?Diffuse, all over.  ?Worse in lower extremities with neuropathies.  ?Has had dg knees that have shown some OA but less than expected with extent of pain. ?Currently taking gabapentin '600mg'$  po qd in am, '300mg'$  with dinner, '300mg'$  po qhs. Tylenol arthritis 500gm po qhs. Uses voltaren gel topically on hands daily when needed.  ?Notes inadequate control ?No clear cause of neuropathies. Had followed with Dr. Carles Collet, last seen in 2019. Has apparently not had EMG ? ? ?Past Medical History:  ?Diagnosis Date  ? Atypical mole 02/24/2014  ? severe-upper left paraspinal (WS)  ? Atypical mole 03/25/2014  ? persistently dysplastic nevi- upper left paraspinal (EXC)  ? Essential tremor   ? GERD (gastroesophageal reflux disease) 02/24/2018  ? History of melanoma 03/26/2018  ? S/p excision on back  ? Hypercholesteremia   ? Mixed hyperlipidemia 02/24/2018  ? Peripheral neuropathy   ? 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 well diff-right hand- txpbx  ? 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 in situ- left cheek (CX35FU)  ? 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 in situ-right parotid area  ? ? ?Past Surgical History:  ?Procedure Laterality Date  ? CATARACT EXTRACTION, BILATERAL    ? GALLBLADDER SURGERY    ? HERNIA REPAIR    ? bilateral  ? MELANOMA EXCISION    ? ? ?Family History  ?Problem Relation Age of Onset  ? Lung cancer Father   ? Bladder Cancer Brother   ? Heart disease Brother   ? Neuropathy Daughter   ? ? ?Social History   ? ?Socioeconomic History  ? Marital status: Married  ?  Spouse name: Not on file  ? Number of children: y  ? Years of education: Not on file  ? Highest education level: Not on file  ?Occupational History  ? Occupation: retired  ?  Comment: [employer]; painting  ?Tobacco Use  ? Smoking status: Former  ?  Packs/day: 1.00  ?  Years: 50.00  ?  Pack years: 50.00  ?  Types: Cigarettes  ?  Quit date: 04/06/2008  ?  Years since quitting: 13.2  ? Smokeless tobacco: Former  ?  Quit date: 05/16/2002  ?Vaping Use  ? Vaping Use: Never used  ?Substance and Sexual Activity  ? Alcohol use: No  ?  Alcohol/week: 0.0 standard drinks  ? Drug use: No  ? Sexual activity: Not on file  ?Other Topics Concern  ? Not on file  ?Social History Narrative  ? Right handed   ? Lives with wife  ? 1 story  ? ?Social Determinants of Health  ? ?Financial Resource Strain: Not on file  ?Food Insecurity: Not on file  ?Transportation Needs: Not on file  ?Physical Activity: Not on file  ?Stress: Not on file  ?Social Connections: Not on file  ?Intimate Partner Violence: Not on file  ? ? ?ROS ?Review of Systems  ?Constitutional:  Negative.   ?HENT: Negative.    ?Eyes: Negative.   ?Respiratory: Negative.    ?Cardiovascular: Negative.   ?Gastrointestinal: Negative.   ?Genitourinary: Negative.   ?Musculoskeletal: Negative.   ?Skin: Negative.   ?Neurological: Negative.   ?Psychiatric/Behavioral: Negative.    ?All other systems reviewed and are negative. ? ?Objective:  ? ?Today's Vitals: BP (!) 146/71   Pulse 98   Temp 97.7 ?F (36.5 ?C) (Temporal)   Resp 18   Ht '5\' 10"'$  (1.778 m)   Wt 179 lb (81.2 kg)   SpO2 99%   BMI 25.68 kg/m?  ? ?Physical Exam ?Vitals and nursing note reviewed.  ?Constitutional:   ?   Appearance: Normal appearance.  ?Cardiovascular:  ?   Rate and Rhythm: Normal rate and regular rhythm.  ?   Pulses: Normal pulses.  ?   Heart sounds: Normal heart sounds. No murmur heard. ?  No friction rub. No gallop.  ?Pulmonary:  ?   Effort:  Pulmonary effort is normal. No respiratory distress.  ?   Breath sounds: Normal breath sounds. No stridor. No wheezing, rhonchi or rales.  ?Neurological:  ?   General: No focal deficit present.  ?   Mental Status: He is alert. Mental status is at baseline.  ?Psychiatric:     ?   Mood and Affect: Mood normal.     ?   Behavior: Behavior normal.     ?   Thought Content: Thought content normal.     ?   Judgment: Judgment normal.  ? ? ?Assessment & Plan:  ? ?Problem List Items Addressed This Visit   ? ?  ? Digestive  ? GERD (gastroesophageal reflux disease)  ? Relevant Medications  ? pantoprazole (PROTONIX) 40 MG tablet  ?  ? Nervous and Auditory  ? Essential tremor - Primary  ? Relevant Medications  ? primidone (MYSOLINE) 50 MG tablet  ? Other Relevant Orders  ? Comprehensive metabolic panel  ? CBC with Differential/Platelet  ? Hemoglobin A1c  ? Idiopathic peripheral neuropathy  ? Relevant Medications  ? primidone (MYSOLINE) 50 MG tablet  ? pregabalin (LYRICA) 150 MG capsule  ? Other Relevant Orders  ? Comprehensive metabolic panel  ? CBC with Differential/Platelet  ? TSH  ? T4, free  ? CK isoenzymes (brain, muscle injury)  ? B12 and Folate Panel  ? Vitamin D (25 hydroxy)  ? Protein Electrophoresis, (serum)  ? Sedimentation rate  ?  ? Musculoskeletal and Integument  ? Osteoarthritis, multiple Anderson  ? Relevant Medications  ? celecoxib (CELEBREX) 200 MG capsule  ? Other Relevant Orders  ? Comprehensive metabolic panel  ?  ? Other  ? Mixed hyperlipidemia  ? Relevant Orders  ? Lipid panel  ? ?Other Visit Diagnoses   ? ? Seasonal allergic rhinitis, unspecified trigger      ? Relevant Medications  ? fluticasone (FLONASE) 50 MCG/ACT nasal spray  ? History of elevated glucose      ? Relevant Orders  ? Comprehensive metabolic panel  ? CBC with Differential/Platelet  ? ?  ? ? ?Outpatient Encounter Medications as of 06/27/2021  ?Medication Sig  ? Acetaminophen (TYLENOL ARTHRITIS EXT RELIEF PO) Take 1 tablet by mouth every other  day.  ? aspirin 81 MG tablet Take 81 mg by mouth every other day.   ? atorvastatin (LIPITOR) 40 MG tablet TAKE ONE TABLET BY MOUTH AT BEDTIME  ? Cyanocobalamin (B-12 PO) Take 1 tablet by mouth daily.  ? gabapentin (NEURONTIN) 300 MG capsule TAKE  ONE CAPSULE BY MOUTH THREE TIMES DAILY  ? ketoconazole (NIZORAL) 2 % shampoo Apply to affected area- let sit 3-5 minutes before rinsing  ? NON FORMULARY CPAP  ? pregabalin (LYRICA) 150 MG capsule Take 1 capsule (150 mg total) by mouth 2 (two) times daily.  ? [DISCONTINUED] celecoxib (CELEBREX) 200 MG capsule TAKE ONE CAPSULE BY MOUTH EVERY DAY  ? [DISCONTINUED] fluticasone (FLONASE) 50 MCG/ACT nasal spray Place 1 spray into both nostrils daily.  ? [DISCONTINUED] pantoprazole (PROTONIX) 40 MG tablet TAKE ONE TABLET BY MOUTH EVERY DAY  ? [DISCONTINUED] primidone (MYSOLINE) 50 MG tablet TAKE ONE TABLET BY MOUTH EVERY MORNING AND ONE TABLET EVERY EVENING  ? celecoxib (CELEBREX) 200 MG capsule Take 1 capsule (200 mg total) by mouth daily.  ? fluticasone (FLONASE) 50 MCG/ACT nasal spray Place 1 spray into both nostrils daily.  ? pantoprazole (PROTONIX) 40 MG tablet Take 1 tablet (40 mg total) by mouth daily.  ? primidone (MYSOLINE) 50 MG tablet TAKE ONE TABLET BY MOUTH EVERY MORNING AND ONE TABLET EVERY EVENING  ? ?No facility-administered encounter medications on file as of 06/27/2021.  ? ? ?Follow-up: Return in about 6 months (around 12/28/2021) for CPE, labs.  ? ?PLAN ?Switch to lyrica to check effect. If not effective, switch back to gabapentin as above. ?Labs as above. Casting a fairly wide net. Will consider getting him back with Dr. Carles Collet in neuro for emg or other work up. ?Refill meds as above ?Patient encouraged to call clinic with any questions, comments, or concerns. ? ?[name], NP ? ?

## 2021-06-27 NOTE — Patient Instructions (Addendum)
Mr. Farnan -  ? ?Great to meet you ? ?Getting a good panel of labs today ? ?Take pregabalin in place of gabapentin. Just two a day - morning and night. If it's not as effective, stop taking it and let me know ? ?I'll call with any urgent lab results ? ?See you in 6 months for a physical and repeat labs ? ?Thanks, ? ?Rich  ? ? ? ?If you have lab work done today you will be contacted with your lab results within the next 2 weeks.  If you have not heard from [place] then please contact us. The fastest way to get your results is to register for My Chart. ? ? ?IF you received an x-ray today, you will receive an invoice from [facility]. Please contact [facility] at [phone] with questions or concerns regarding your invoice.  ? ?IF you received labwork today, you will receive an invoice from [facility]. Please contact [facility] at [phone] with questions or concerns regarding your invoice.  ? ?Our billing staff will not be able to assist you with questions regarding bills from these companies. ? ?You will be contacted with the lab results as soon as they are available. The fastest way to get your results is to activate your My Chart account. Instructions are located on the last page of this paperwork. If you have not heard from [place] regarding the results in 2 weeks, please contact this office. ?  ? ? ?

## 2021-07-01 LAB — PROTEIN ELECTROPHORESIS, SERUM
Albumin ELP: 4.2 g/dL (ref 3.8–4.8)
Alpha 1: 0.3 g/dL (ref 0.2–0.3)
Alpha 2: 0.9 g/dL (ref 0.5–0.9)
Beta 2: 0.4 g/dL (ref 0.2–0.5)
Beta Globulin: 0.5 g/dL (ref 0.4–0.6)
Gamma Globulin: 0.9 g/dL (ref 0.8–1.7)
Total Protein: 7.1 g/dL (ref 6.1–8.1)

## 2021-07-05 ENCOUNTER — Encounter: Payer: Self-pay | Admitting: Family Medicine

## 2021-07-05 ENCOUNTER — Ambulatory Visit (INDEPENDENT_AMBULATORY_CARE_PROVIDER_SITE_OTHER): Payer: PPO | Admitting: Family Medicine

## 2021-07-05 VITALS — BP 138/74 | HR 74 | Temp 97.8°F | Resp 17 | Ht 70.0 in | Wt 182.4 lb

## 2021-07-05 DIAGNOSIS — J301 Allergic rhinitis due to pollen: Secondary | ICD-10-CM

## 2021-07-05 DIAGNOSIS — R0981 Nasal congestion: Secondary | ICD-10-CM | POA: Diagnosis not present

## 2021-07-05 MED ORDER — IPRATROPIUM BROMIDE 0.06 % NA SOLN: 1.0000 | Freq: Four times a day (QID) | NASAL | 5 refills | Status: DC

## 2021-07-05 NOTE — Patient Instructions (Addendum)
Try claritin over the counter once per day. Stop if trouble urinating or dizziness.  ?Continue flovent nasal spray 2 spray per nostril daily- as allergies improve, can decrease amount used.  ?Adding new nasal spray (not at same time as flonase). atrovent nasal spray.  ?Recheck if not improving in next week.  ?Return to the clinic or go to the nearest emergency room if any of your symptoms worsen or new symptoms occur. ? ? ?

## 2021-07-05 NOTE — Progress Notes (Signed)
? ?Subjective:  ?Patient ID: Stephen Anderson, male    DOB: [birth year]  Age: 81 y.o. MRN: [id number] ? ?CC:  ?Chief Complaint  ?Patient presents with  ? Nasal Congestion  ?  Pt has had congestions, notes has been 2 weeks, has had some continued notes mostly facial pain wanted to have something to help  clear congestion   ? ? ?HPI ?Stephen Anderson presents for  ? ?Nasal congestion: ?Multiple dental extractions 8 days ago. Notice nasal congestion after working outside later last week. No fever. Noticed postnasal drip, some sore throat.  ?Some HA, pain behind eyes. Similar as last year with pollen flare, or with mowing without mask. Unknown if discolored nasal d/c.  ?No fever, dyspnea, or chest pain. No sick contacts. Feels ok o/w - no new myalgias. Eyes watering, some sneezing at times. Minimal.  ?Has used flonase BID. Using consistently.  ?Equate '10mg'$  sinus PE. Advil (recommended by dentist), throat lozenges. ?  ? ?History ?Patient Active Problem List  ? Diagnosis Date Noted  ? Vitamin B12 deficiency 05/25/2019  ? Screening for colorectal cancer 05/21/2019  ? Insomnia 04/23/2018  ? History of melanoma 03/26/2018  ? GERD (gastroesophageal reflux disease) 02/24/2018  ? Mixed hyperlipidemia 02/24/2018  ? 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 BPH with obstruction/lower urinary tract symptoms 02/24/2018  ? OSA on CPAP 12/30/2013  ? Essential tremor 05/16/2012  ? Idiopathic peripheral neuropathy 05/16/2012  ? ?Past Medical History:  ?Diagnosis Date  ? Atypical mole 02/24/2014  ? severe-upper left paraspinal (WS)  ? Atypical mole 03/25/2014  ? persistently dysplastic nevi- upper left paraspinal (EXC)  ? Essential tremor   ? GERD (gastroesophageal reflux disease) 02/24/2018  ? History of melanoma 03/26/2018  ? S/p excision on back  ? Hypercholesteremia   ? Mixed hyperlipidemia 02/24/2018  ? Peripheral neuropathy   ? 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 well diff-right hand- txpbx  ? Squamous cell carcinoma of  face 05/20/2018  ? in situ- left cheek (CX35FU)  ? 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 in situ-right parotid area  ? ?Past Surgical History:  ?Procedure Laterality Date  ? CATARACT EXTRACTION, BILATERAL    ? GALLBLADDER SURGERY    ? HERNIA REPAIR    ? bilateral  ? MELANOMA EXCISION    ? ?Allergies  ?Allergen Reactions  ? Oxycontin [Oxycodone Hcl] Hives  ? ?Prior to Admission medications   ?Medication Sig Start Date End Date Taking? Authorizing Provider  ?Acetaminophen (TYLENOL ARTHRITIS EXT RELIEF PO) Take 1 tablet by mouth every other day.   Yes [provider]  ?aspirin 81 MG tablet Take 81 mg by mouth every other day.    Yes [provider]  ?atorvastatin (LIPITOR) 40 MG tablet TAKE ONE TABLET BY MOUTH AT BEDTIME 05/02/21  Yes Leamon Arnt, MD  ?celecoxib (CELEBREX) 200 MG capsule Take 1 capsule (200 mg total) by mouth daily. 06/27/21  Yes [name], NP  ?Cyanocobalamin (B-12 PO) Take 1 tablet by mouth daily.   Yes [provider]  ?fluticasone (FLONASE) 50 MCG/ACT nasal spray Place 1 spray into both nostrils daily. 06/27/21  Yes [name], NP  ?gabapentin (NEURONTIN) 300 MG capsule TAKE ONE CAPSULE BY MOUTH THREE TIMES DAILY 02/06/21  Yes Leamon Arnt, MD  ?ketoconazole (NIZORAL) 2 % shampoo Apply to affected area- let sit 3-5 minutes before rinsing 11/16/20  Yes Sheffield, Vida Roller R, PA-C  ?NON FORMULARY CPAP   Yes [provider]  ?pantoprazole (PROTONIX) 40  MG tablet Take 1 tablet (40 mg total) by mouth daily. 06/27/21  Yes [name], NP  ?pregabalin (LYRICA) 150 MG capsule Take 1 capsule (150 mg total) by mouth 2 (two) times daily. 06/27/21  Yes [name], NP  ?primidone (MYSOLINE) 50 MG tablet TAKE ONE TABLET BY MOUTH EVERY MORNING AND ONE TABLET EVERY EVENING 06/27/21  Yes [name], NP  ? ?Social History  ? ?Socioeconomic History  ? Marital status: Married  ?  Spouse name: Not on file  ? Number of children: y  ? Years of education:  Not on file  ? Highest education level: Not on file  ?Occupational History  ? Occupation: retired  ?  Comment: [employer]; painting  ?Tobacco Use  ? Smoking status: Former  ?  Packs/day: 1.00  ?  Years: 50.00  ?  Pack years: 50.00  ?  Types: Cigarettes  ?  Quit date: 04/06/2008  ?  Years since quitting: 13.2  ? Smokeless tobacco: Former  ?  Quit date: 05/16/2002  ?Vaping Use  ? Vaping Use: Never used  ?Substance and Sexual Activity  ? Alcohol use: No  ?  Alcohol/week: 0.0 standard drinks  ? Drug use: No  ? Sexual activity: Not on file  ?Other Topics Concern  ? Not on file  ?Social History Narrative  ? Right handed   ? Lives with wife  ? 1 story  ? ?Social Determinants of Health  ? ?Financial Resource Strain: Not on file  ?Food Insecurity: Not on file  ?Transportation Needs: Not on file  ?Physical Activity: Not on file  ?Stress: Not on file  ?Social Connections: Not on file  ?Intimate Partner Violence: Not on file  ? ? ?Review of Systems ?Per HPI.  ? ?Objective:  ? ?Vitals:  ? 07/05/21 1102  ?BP: 138/74  ?Pulse: 74  ?Resp: 17  ?Temp: 97.8 ?F (36.6 ?C)  ?TempSrc: Temporal  ?SpO2: 97%  ?Weight: 182 lb 6.4 oz (82.7 kg)  ?Height: '5\' 10"'$  (1.778 m)  ? ? ? ?Physical Exam ?Vitals reviewed.  ?Constitutional:   ?   Appearance: He is well-developed.  ?HENT:  ?   Head: Normocephalic and atraumatic.  ?   Right Ear: Tympanic membrane, ear canal and external ear normal.  ?   Left Ear: Tympanic membrane, ear canal and external ear normal.  ?   Nose: Congestion (min congestion bilat.) present. No rhinorrhea.  ?   Comments: Sinuses nontender.  ?   Mouth/Throat:  ?   Pharynx: No oropharyngeal exudate or posterior oropharyngeal erythema.  ?   Comments: No exudate seen at areas of prior extractions.  ?Eyes:  ?   Conjunctiva/sclera: Conjunctivae normal.  ?   Pupils: Pupils are equal, round, and reactive to light.  ?Cardiovascular:  ?   Rate and Rhythm: Normal rate and regular rhythm.  ?   Heart sounds: Normal heart sounds. No murmur  heard. ?Pulmonary:  ?   Effort: Pulmonary effort is normal.  ?   Breath sounds: Normal breath sounds. No wheezing, rhonchi or rales.  ?Abdominal:  ?   Palpations: Abdomen is soft.  ?   Tenderness: There is no abdominal tenderness.  ?Musculoskeletal:  ?   Cervical back: Neck supple.  ?Lymphadenopathy:  ?   Cervical: No cervical adenopathy.  ?Skin: ?   General: Skin is warm and dry.  ?   Findings: No rash.  ?Neurological:  ?   Mental Status: He is alert and oriented to person, place, and time.  ?  Psychiatric:     ?   Behavior: Behavior normal.  ? ? ? ? ? ?Assessment & Plan:  ?Stephen Anderson is a 81 y.o. male . ?Nasal congestion - Plan: ipratropium (ATROVENT) 0.06 % nasal spray ? ?Allergic rhinitis due to pollen, unspecified seasonality ?Congestion likely due to allergies based on timing of symptoms.  Unlikely sinusitis.  Unlikely related to dental procedure.  Continue Flonase, option of adding Claritin with potential side effects discussed, stop if urinary issues other new side effects.  Add Atrovent nasal spray.  Recheck next week if not improving.  Hold on prednisone, antibiotics for now. ? ?Meds ordered this encounter  ?Medications  ? ipratropium (ATROVENT) 0.06 % nasal spray  ?  Sig: Place 1-2 sprays into both nostrils 4 (four) times daily. As needed for nasal congestion  ?  Dispense:  15 mL  ?  Refill:  5  ? ?Patient Instructions  ?Try claritin over the counter once per day. Stop if trouble urinating or dizziness.  ?Continue flovent nasal spray 2 spray per nostril daily- as allergies improve, can decrease amount used.  ?Adding new nasal spray (not at same time as flonase). atrovent nasal spray.  ?Recheck if not improving in next week.  ?Return to the clinic or go to the nearest emergency room if any of your symptoms worsen or new symptoms occur. ? ? ? ? ? ?Signed,  ? ?Merri Ray, MD ?[place], [facility] ?[facility] ?07/05/21 ?12:03 PM ? ? ?

## 2021-07-18 ENCOUNTER — Ambulatory Visit: Payer: PPO | Admitting: Dermatology

## 2021-08-16 ENCOUNTER — Ambulatory Visit: Payer: PPO | Admitting: Physician Assistant

## 2021-08-30 ENCOUNTER — Other Ambulatory Visit: Payer: Self-pay | Admitting: Registered Nurse

## 2021-08-30 DIAGNOSIS — G609 Hereditary and idiopathic neuropathy, unspecified: Secondary | ICD-10-CM

## 2021-08-30 MED ORDER — PREGABALIN 150 MG PO CAPS: 150.0000 mg | ORAL_CAPSULE | Freq: Two times a day (BID) | ORAL | 0 refills | Status: DC

## 2021-09-14 ENCOUNTER — Ambulatory Visit: Payer: PPO | Admitting: Physician Assistant

## 2021-09-14 ENCOUNTER — Encounter: Payer: Self-pay | Admitting: Physician Assistant

## 2021-09-14 DIAGNOSIS — Z1283 Encounter for screening for malignant neoplasm of skin: Secondary | ICD-10-CM | POA: Diagnosis not present

## 2021-09-14 DIAGNOSIS — L570 Actinic keratosis: Secondary | ICD-10-CM | POA: Diagnosis not present

## 2021-09-14 NOTE — Progress Notes (Signed)
   Follow-Up Visit   Subjective  Stephen Anderson is a 81 y.o. male who presents for the following: Annual Exam (Patient here today for yearly skin check no concerns. Personal history of atypical moles, and non mole skin cancer. ).   The following portions of the chart were reviewed this encounter and updated as appropriate:  Tobacco  Allergies  Meds  Problems  Med Hx  Surg Hx  Fam Hx      Objective  Well appearing patient in no apparent distress; mood and affect are within normal limits.  All skin waist up examined.  Waist up skin examination-No atypical nevi or signs of NMSC noted at the time of the visit.    Left Temple (3), Right Temporal Scalp (3), Scalp Erythematous patches with gritty scale.   Assessment & Plan  Encounter for screening for malignant neoplasm of skin  Yearly skin examinations  AK (actinic keratosis) (7) Left Temple (3); Right Temporal Scalp (3); Scalp  Destruction of lesion - Left Temple, Right Temporal Scalp, Scalp Complexity: simple   Destruction method: cryotherapy   Informed consent: discussed and consent obtained   Timeout:  patient name, date of birth, surgical site, and procedure verified Lesion destroyed using liquid nitrogen: Yes   Cryotherapy cycles:  3 Outcome: patient tolerated procedure well with no complications      I, Aliza Moret, PA-C, have reviewed all documentation's for this visit.  The documentation on 09/14/21 for the exam, diagnosis, procedures and orders are all accurate and complete.

## 2021-09-21 ENCOUNTER — Ambulatory Visit (INDEPENDENT_AMBULATORY_CARE_PROVIDER_SITE_OTHER): Payer: PPO | Admitting: Family Medicine

## 2021-09-21 ENCOUNTER — Encounter: Payer: Self-pay | Admitting: Family Medicine

## 2021-09-21 VITALS — BP 128/74 | HR 62 | Temp 97.7°F | Resp 16 | Ht 70.0 in | Wt 176.1 lb

## 2021-09-21 DIAGNOSIS — G2581 Restless legs syndrome: Secondary | ICD-10-CM

## 2021-09-21 DIAGNOSIS — G609 Hereditary and idiopathic neuropathy, unspecified: Secondary | ICD-10-CM | POA: Diagnosis not present

## 2021-09-21 DIAGNOSIS — M150 Primary generalized (osteo)arthritis: Secondary | ICD-10-CM

## 2021-09-21 DIAGNOSIS — M159 Polyosteoarthritis, unspecified: Secondary | ICD-10-CM

## 2021-09-21 MED ORDER — CELECOXIB 200 MG PO CAPS: 200.0000 mg | ORAL_CAPSULE | Freq: Two times a day (BID) | ORAL | 0 refills | Status: DC

## 2021-09-21 NOTE — Patient Instructions (Signed)
Follow up w/ Rich in 4-6 weeks to recheck pain and RLS INCREASE the Celebrex to twice daily CONTINUE the Lyrica twice daily USE the Voltaren gel for joint pains CONTINUE the Tylenol Arthritis as needed for breakthrough pain Call with any questions or concerns Hang in there!

## 2021-09-21 NOTE — Progress Notes (Signed)
   Subjective:    Patient ID: Stephen Anderson, male    DOB: 1940-08-27, 81 y.o.   MRN: [id number]  HPI Neuropathy- pt was switched to Lyrica on 5/24.  Currently on '150mg'$  BID.  Previously on Gabapentin '600mg'$  TID which caused excessive sedation.  Pt reports he is numb from 'my knees down'.    Arthritis- pt reports walking/running on treadmill regularly.  Does yard work frequently.  Has pain in hands bilaterally.  Currently on Celebrex '200mg'$  daily.  If stands for any period of time, 'i gotta learn how to walk again'.  Using Voltaren gel w/ some relief.    RLS- pt reports he is not resting well at night b/c his legs 'can't be still'.  'i gotta move em'.     Review of Systems For ROS see HPI     Objective:   Physical Exam Vitals reviewed.  Constitutional:      General: He is not in acute distress.    Appearance: Normal appearance. He is not ill-appearing.  HENT:     Head: Normocephalic and atraumatic.  Musculoskeletal:     Right lower leg: No edema.     Left lower leg: No edema.  Skin:    General: Skin is warm and dry.  Neurological:     General: No focal deficit present.     Mental Status: He is alert and oriented to person, place, and time.  Psychiatric:        Mood and Affect: Mood normal.        Behavior: Behavior normal.        Thought Content: Thought content normal.           Assessment & Plan:

## 2021-09-22 MED ORDER — PREGABALIN 150 MG PO CAPS: 150.0000 mg | ORAL_CAPSULE | Freq: Two times a day (BID) | ORAL | 0 refills | Status: DC

## 2021-09-22 NOTE — Assessment & Plan Note (Signed)
Chronic problem for pt.  Encouraged him to continue the Lyrica at this time.  Refill provided.  To f/u w/ PCP for med changes if needed.

## 2021-09-22 NOTE — Assessment & Plan Note (Signed)
New.  I explained to pt that I prefer to make 1 medication adjustment at a time and since we are increasing his Celebrex, I think it would be best to hold on treating the RLS.  Encouraged him to schedule a f/u w/ PCP in a few weeks to determine if an additional medication is required.  Pt expressed understanding and is in agreement w/ plan.

## 2021-09-22 NOTE — Assessment & Plan Note (Signed)
Ongoing issue for pt.  Pain is not well controlled and pt is frustrated and even angry about this.  He is very active- walking and running regularly.  Finds he has joint pain and stiffness throughout- particularly if he stays in 1 position for any period of time.  Will increase Celebrex to BID.  Encouraged him to continue his protonix daily.  Can continue to use Voltaren gel topically and tylenol as needed for breakthrough pain.  Pt expressed understanding and is in agreement w/ plan.

## 2021-10-26 ENCOUNTER — Encounter: Payer: Self-pay | Admitting: Registered Nurse

## 2021-10-26 ENCOUNTER — Ambulatory Visit (INDEPENDENT_AMBULATORY_CARE_PROVIDER_SITE_OTHER): Payer: PPO | Admitting: Registered Nurse

## 2021-10-26 ENCOUNTER — Other Ambulatory Visit: Payer: Self-pay

## 2021-10-26 VITALS — BP 128/68 | HR 68 | Temp 98.2°F | Resp 18 | Ht 70.0 in | Wt 168.0 lb

## 2021-10-26 DIAGNOSIS — E538 Deficiency of other specified B group vitamins: Secondary | ICD-10-CM | POA: Diagnosis not present

## 2021-10-26 DIAGNOSIS — G609 Hereditary and idiopathic neuropathy, unspecified: Secondary | ICD-10-CM | POA: Diagnosis not present

## 2021-10-26 DIAGNOSIS — R5382 Chronic fatigue, unspecified: Secondary | ICD-10-CM | POA: Diagnosis not present

## 2021-10-26 LAB — COMPREHENSIVE METABOLIC PANEL WITH GFR
ALT: 9 U/L (ref 0–53)
AST: 17 U/L (ref 0–37)
Albumin: 4.3 g/dL (ref 3.5–5.2)
Alkaline Phosphatase: 78 U/L (ref 39–117)
BUN: 22 mg/dL (ref 6–23)
CO2: 26 meq/L (ref 19–32)
Calcium: 8.8 mg/dL (ref 8.4–10.5)
Chloride: 105 meq/L (ref 96–112)
Creatinine, Ser: 1.28 mg/dL (ref 0.40–1.50)
GFR: 53.29 mL/min — ABNORMAL LOW
Glucose, Bld: 97 mg/dL (ref 70–99)
Potassium: 4.3 meq/L (ref 3.5–5.1)
Sodium: 139 meq/L (ref 135–145)
Total Bilirubin: 0.3 mg/dL (ref 0.2–1.2)
Total Protein: 6.9 g/dL (ref 6.0–8.3)

## 2021-10-26 LAB — CBC WITH DIFFERENTIAL/PLATELET
Basophils Absolute: 0 10*3/uL (ref 0.0–0.1)
Basophils Relative: 0.6 % (ref 0.0–3.0)
Eosinophils Absolute: 0.2 10*3/uL (ref 0.0–0.7)
Eosinophils Relative: 3.7 % (ref 0.0–5.0)
HCT: 36.5 % — ABNORMAL LOW (ref 39.0–52.0)
Hemoglobin: 11.9 g/dL — ABNORMAL LOW (ref 13.0–17.0)
Lymphocytes Relative: 32.4 % (ref 12.0–46.0)
Lymphs Abs: 1.9 10*3/uL (ref 0.7–4.0)
MCHC: 32.6 g/dL (ref 30.0–36.0)
MCV: 81.2 fl (ref 78.0–100.0)
Monocytes Absolute: 0.6 10*3/uL (ref 0.1–1.0)
Monocytes Relative: 9.7 % (ref 3.0–12.0)
Neutro Abs: 3.1 10*3/uL (ref 1.4–7.7)
Neutrophils Relative %: 53.6 % (ref 43.0–77.0)
Platelets: 150 10*3/uL (ref 150.0–400.0)
RBC: 4.49 Mil/uL (ref 4.22–5.81)
RDW: 15.6 % — ABNORMAL HIGH (ref 11.5–15.5)
WBC: 5.8 10*3/uL (ref 4.0–10.5)

## 2021-10-26 LAB — HEMOGLOBIN A1C: Hgb A1c MFr Bld: 6.1 % (ref 4.6–6.5)

## 2021-10-26 LAB — IBC + FERRITIN
Ferritin: 7 ng/mL — ABNORMAL LOW (ref 22.0–322.0)
Iron: 42 ug/dL (ref 42–165)
Saturation Ratios: 8.9 % — ABNORMAL LOW (ref 20.0–50.0)
TIBC: 471.8 ug/dL — ABNORMAL HIGH (ref 250.0–450.0)
Transferrin: 337 mg/dL (ref 212.0–360.0)

## 2021-10-26 LAB — B12 AND FOLATE PANEL
Folate: 9.5 ng/mL (ref >5.9)
Vitamin B-12: 866 pg/mL (ref 211–911)

## 2021-10-26 LAB — TSH: TSH: 1.43 u[IU]/mL (ref 0.35–5.50)

## 2021-10-26 LAB — VITAMIN D 25 HYDROXY (VIT D DEFICIENCY, FRACTURES): VITD: 23.42 ng/mL — ABNORMAL LOW (ref 30.00–100.00)

## 2021-10-26 NOTE — Patient Instructions (Addendum)
Stephen Anderson -   [name] to see you. Thanks for letting me take part in your care.   Check out these names for a new PCP: Berniece Pap, MD Agustina Caroli, MD Myrna Blazer Early, NP Jeralyn Ruths, DNP  Stay well!  [name]   If you have lab work done today you will be contacted with your lab results within the next 2 weeks.  If you have not heard from [place] then please contact us. The fastest way to get your results is to register for My Chart.   IF you received an x-ray today, you will receive an invoice from [facility]. Please contact [facility] at [phone] with questions or concerns regarding your invoice.   IF you received labwork today, you will receive an invoice from [place]. Please contact [facility] at [phone] with questions or concerns regarding your invoice.   Our billing staff will not be able to assist you with questions regarding bills from these companies.  You will be contacted with the lab results as soon as they are available. The fastest way to get your results is to activate your My Chart account. Instructions are located on the last page of this paperwork. If you have not heard from [place] regarding the results in 2 weeks, please contact this office.

## 2021-10-26 NOTE — Assessment & Plan Note (Signed)
Stable on lyrica. Will plan to continue. Worsened positionally - advised on activity modification to help

## 2021-10-26 NOTE — Assessment & Plan Note (Signed)
Check labs and replenish as indicated.

## 2021-10-26 NOTE — Progress Notes (Signed)
Established Patient Office Visit  Subjective:  Patient ID: Stephen Anderson, male    DOB: 06-14-1940  Age: 81 y.o. MRN: [id number]  CC:  Chief Complaint  Patient presents with   Follow-up    Patient states he is here to follow up on pain and discuss being fatigue.    HPI DUAN SCHARNHORST presents for follow up   Seen by Dr. Birdie Riddle on 09/21/21 RLS and idiopathic neuropathy Started on celebrex '200mg'$  po bid.  He is also on lyrica '150mg'$  po bid, primidone '50mg'$  po bid  He does note that he has had some fatigue ongoing. No weight loss, shob, doe, chest pain, headaches, visual changes.   Does note that his Hgb was in the 11s when trying to donate blood on Monday. This is the first time that he has ever been turned away from donating.   Feels that pregabalin is helping somewhat, but notes it comes with AE of mild sedation.  He has been losing weight - unintentional. He has been working out. Consistent diet. No nvd, constipation, or other GI complaints. No shob, doe, cough, chest pain. Colonoscopy 2018 with 10 year recall.   Outpatient Medications Prior to Visit  Medication Sig Dispense Refill   Acetaminophen (TYLENOL ARTHRITIS EXT RELIEF PO) Take 1 tablet by mouth every other day.     aspirin 81 MG tablet Take 81 mg by mouth every other day.      atorvastatin (LIPITOR) 40 MG tablet TAKE ONE TABLET BY MOUTH AT BEDTIME 90 tablet 3   celecoxib (CELEBREX) 200 MG capsule Take 1 capsule (200 mg total) by mouth 2 (two) times daily. 180 capsule 0   Cyanocobalamin (B-12 PO) Take 1 tablet by mouth daily.     fluticasone (FLONASE) 50 MCG/ACT nasal spray Place 1 spray into both nostrils daily. 16 g 6   ipratropium (ATROVENT) 0.06 % nasal spray Place 1-2 sprays into both nostrils 4 (four) times daily. As needed for nasal congestion 15 mL 5   ketoconazole (NIZORAL) 2 % shampoo Apply to affected area- let sit 3-5 minutes before rinsing 120 mL 5   NON FORMULARY CPAP     pantoprazole (PROTONIX) 40 MG  tablet Take 1 tablet (40 mg total) by mouth daily. 90 tablet 3   pregabalin (LYRICA) 150 MG capsule Take 1 capsule (150 mg total) by mouth 2 (two) times daily. 180 capsule 0   primidone (MYSOLINE) 50 MG tablet TAKE ONE TABLET BY MOUTH EVERY MORNING AND ONE TABLET EVERY EVENING 180 tablet 3   No facility-administered medications prior to visit.    Review of Systems  Constitutional: Negative.   HENT: Negative.    Eyes: Negative.   Respiratory: Negative.    Cardiovascular: Negative.   Gastrointestinal: Negative.   Genitourinary: Negative.   Musculoskeletal: Negative.   Skin: Negative.   Neurological: Negative.   Psychiatric/Behavioral: Negative.    All other systems reviewed and are negative.     Objective:     BP 128/68   Pulse 68   Temp 98.2 F (36.8 C) (Temporal)   Resp 18   Ht '5\' 10"'$  (1.778 m)   Wt 168 lb (76.2 kg)   SpO2 98%   BMI 24.11 kg/m   Wt Readings from Last 3 Encounters:  10/26/21 168 lb (76.2 kg)  09/21/21 176 lb 2 oz (79.9 kg)  07/05/21 182 lb 6.4 oz (82.7 kg)   Physical Exam Constitutional:      General: He is not in acute distress.  Appearance: Normal appearance. He is normal weight. He is not ill-appearing, toxic-appearing or diaphoretic.  Cardiovascular:     Rate and Rhythm: Normal rate and regular rhythm.     Heart sounds: Normal heart sounds. No murmur heard.    No friction rub. No gallop.  Pulmonary:     Effort: Pulmonary effort is normal. No respiratory distress.     Breath sounds: Normal breath sounds. No stridor. No wheezing, rhonchi or rales.  Chest:     Chest wall: No tenderness.  Neurological:     General: No focal deficit present.     Mental Status: He is alert and oriented to person, place, and time. Mental status is at baseline.  Psychiatric:        Mood and Affect: Mood normal.        Behavior: Behavior normal.        Thought Content: Thought content normal.        Judgment: Judgment normal.     No results found for any  visits on 10/26/21.    The 10-year ASCVD risk score (Arnett DK, et al., 2019) is: 30.1%    Assessment & Plan:   Problem List Items Addressed This Visit       Nervous and Auditory   Idiopathic peripheral neuropathy    Stable on lyrica. Will plan to continue. Worsened positionally - advised on activity modification to help       Relevant Orders   Comprehensive metabolic panel   CBC with Differential/Platelet   TSH   Hemoglobin A1c   B12 and Folate Panel   Vitamin D (25 hydroxy)   IBC + Ferritin     Other   B12 deficiency    Check labs and replenish as indicated.      Relevant Orders   Methylmalonic Acid   Homocysteine   Chronic fatigue - Primary    Unclear etiology. Will collect labs today. Given his apparent waning hbg per his experience with blood donation, will focus on iron panel, RBC indices, and hgb      Relevant Orders   Comprehensive metabolic panel   CBC with Differential/Platelet   TSH   Hemoglobin A1c   B12 and Folate Panel   Vitamin D (25 hydroxy)   IBC + Ferritin    No orders of the defined types were placed in this encounter.   Return in about 3 months (around 01/26/2022) for CPE and labs.    [name], NP

## 2021-10-26 NOTE — Assessment & Plan Note (Signed)
Unclear etiology. Will collect labs today. Given his apparent waning hbg per his experience with blood donation, will focus on iron panel, RBC indices, and hgb

## 2021-10-29 LAB — HOMOCYSTEINE: Homocysteine: 9.8 umol/L (ref <11.4)

## 2021-10-29 LAB — METHYLMALONIC ACID, SERUM: Methylmalonic Acid, Quant: 96 nmol/L (ref 87–318)

## 2021-10-31 ENCOUNTER — Telehealth: Payer: Self-pay

## 2021-10-31 NOTE — Telephone Encounter (Signed)
Patient and his wife has called to see who the will be seeing now that rich is leaving. Patient wants to know if they could be added on waiting list for Carlota Raspberry or Birdie Riddle because he doesn't want to leave [place].

## 2021-11-14 NOTE — Telephone Encounter (Signed)
Called patient wife to make her aware

## 2021-11-22 DIAGNOSIS — H52203 Unspecified astigmatism, bilateral: Secondary | ICD-10-CM | POA: Diagnosis not present

## 2021-11-22 DIAGNOSIS — Z961 Presence of intraocular lens: Secondary | ICD-10-CM | POA: Diagnosis not present

## 2021-11-27 DIAGNOSIS — X32XXXD Exposure to sunlight, subsequent encounter: Secondary | ICD-10-CM | POA: Diagnosis not present

## 2021-11-27 DIAGNOSIS — L570 Actinic keratosis: Secondary | ICD-10-CM | POA: Diagnosis not present

## 2021-12-13 ENCOUNTER — Other Ambulatory Visit: Payer: Self-pay | Admitting: Physician Assistant

## 2021-12-13 DIAGNOSIS — L218 Other seborrheic dermatitis: Secondary | ICD-10-CM

## 2021-12-22 ENCOUNTER — Other Ambulatory Visit: Payer: Self-pay | Admitting: Family

## 2021-12-22 ENCOUNTER — Telehealth: Payer: Self-pay | Admitting: Registered Nurse

## 2021-12-22 DIAGNOSIS — G609 Hereditary and idiopathic neuropathy, unspecified: Secondary | ICD-10-CM

## 2021-12-22 DIAGNOSIS — L218 Other seborrheic dermatitis: Secondary | ICD-10-CM

## 2021-12-22 MED ORDER — PREGABALIN 150 MG PO CAPS
150.0000 mg | ORAL_CAPSULE | Freq: Two times a day (BID) | ORAL | 0 refills | Status: DC
Start: 2021-12-22 — End: 2022-03-06

## 2021-12-22 MED ORDER — KETOCONAZOLE 2 % EX SHAM: MEDICATED_SHAMPOO | CUTANEOUS | 5 refills | Status: DC

## 2021-12-22 NOTE — Telephone Encounter (Signed)
Encourage patient to contact the pharmacy for refills or they can request refills through [facility]  (Please schedule appointment if patient has not been seen in over a year)    [facility] TO: [place] [phone]  MEDICATION NAME & DOSE: pregabalin 150 mg AND ketoconazole 2% shampoo   NOTES/COMMENTS FROM PATIENT:      [place] office please notify patient: It takes 48-72 hours to process rx refill requests Ask patient to call pharmacy to ensure rx is ready before heading there.

## 2022-01-01 ENCOUNTER — Encounter: Payer: PPO | Admitting: Registered Nurse

## 2022-02-01 ENCOUNTER — Other Ambulatory Visit: Payer: Self-pay | Admitting: Family Medicine

## 2022-03-06 ENCOUNTER — Ambulatory Visit (INDEPENDENT_AMBULATORY_CARE_PROVIDER_SITE_OTHER): Payer: PPO | Admitting: Family

## 2022-03-06 ENCOUNTER — Encounter: Payer: Self-pay | Admitting: Family

## 2022-03-06 VITALS — BP 124/78 | HR 66 | Temp 98.0°F | Ht 70.0 in | Wt 178.8 lb

## 2022-03-06 DIAGNOSIS — Z8639 Personal history of other endocrine, nutritional and metabolic disease: Secondary | ICD-10-CM

## 2022-03-06 DIAGNOSIS — G2581 Restless legs syndrome: Secondary | ICD-10-CM | POA: Diagnosis not present

## 2022-03-06 DIAGNOSIS — E782 Mixed hyperlipidemia: Secondary | ICD-10-CM

## 2022-03-06 DIAGNOSIS — E538 Deficiency of other specified B group vitamins: Secondary | ICD-10-CM | POA: Diagnosis not present

## 2022-03-06 DIAGNOSIS — K4090 Unilateral inguinal hernia, without obstruction or gangrene, not specified as recurrent: Secondary | ICD-10-CM

## 2022-03-06 DIAGNOSIS — K219 Gastro-esophageal reflux disease without esophagitis: Secondary | ICD-10-CM | POA: Diagnosis not present

## 2022-03-06 DIAGNOSIS — M159 Polyosteoarthritis, unspecified: Secondary | ICD-10-CM | POA: Diagnosis not present

## 2022-03-06 DIAGNOSIS — G609 Hereditary and idiopathic neuropathy, unspecified: Secondary | ICD-10-CM

## 2022-03-06 LAB — LIPID PANEL
Cholesterol: 152 mg/dL (ref 0–200)
HDL: 38 mg/dL — ABNORMAL LOW (ref >39.00)
NonHDL: 114.25
Total CHOL/HDL Ratio: 4
Triglycerides: 214 mg/dL — ABNORMAL HIGH (ref 0.0–149.0)
VLDL: 42.8 mg/dL — ABNORMAL HIGH (ref 0.0–40.0)

## 2022-03-06 LAB — VITAMIN B12: Vitamin B-12: 582 pg/mL (ref 211–911)

## 2022-03-06 LAB — CBC WITH DIFFERENTIAL/PLATELET
Basophils Absolute: 0 10*3/uL (ref 0.0–0.1)
Basophils Relative: 0.4 % (ref 0.0–3.0)
Eosinophils Absolute: 0.2 10*3/uL (ref 0.0–0.7)
Eosinophils Relative: 2.9 % (ref 0.0–5.0)
HCT: 42.6 % (ref 39.0–52.0)
Hemoglobin: 13.9 g/dL (ref 13.0–17.0)
Lymphocytes Relative: 29.5 % (ref 12.0–46.0)
Lymphs Abs: 1.9 10*3/uL (ref 0.7–4.0)
MCHC: 32.6 g/dL (ref 30.0–36.0)
MCV: 88.9 fl (ref 78.0–100.0)
Monocytes Absolute: 0.6 10*3/uL (ref 0.1–1.0)
Monocytes Relative: 9.4 % (ref 3.0–12.0)
Neutro Abs: 3.7 10*3/uL (ref 1.4–7.7)
Neutrophils Relative %: 57.8 % (ref 43.0–77.0)
Platelets: 177 10*3/uL (ref 150.0–400.0)
RBC: 4.79 Mil/uL (ref 4.22–5.81)
RDW: 16.2 % — ABNORMAL HIGH (ref 11.5–15.5)
WBC: 6.4 10*3/uL (ref 4.0–10.5)

## 2022-03-06 LAB — BASIC METABOLIC PANEL
BUN: 25 mg/dL — ABNORMAL HIGH (ref 6–23)
CO2: 30 mEq/L (ref 19–32)
Calcium: 8.9 mg/dL (ref 8.4–10.5)
Chloride: 104 mEq/L (ref 96–112)
Creatinine, Ser: 1.32 mg/dL (ref 0.40–1.50)
GFR: 51.22 mL/min — ABNORMAL LOW (ref >60.00)
Glucose, Bld: 94 mg/dL (ref 70–99)
Potassium: 4.7 mEq/L (ref 3.5–5.1)
Sodium: 141 mEq/L (ref 135–145)

## 2022-03-06 LAB — IBC + FERRITIN
Ferritin: 19.4 ng/mL — ABNORMAL LOW (ref 22.0–322.0)
Iron: 77 ug/dL (ref 42–165)
Saturation Ratios: 22.2 % (ref 20.0–50.0)
TIBC: 347.2 ug/dL (ref 250.0–450.0)
Transferrin: 248 mg/dL (ref 212.0–360.0)

## 2022-03-06 LAB — VITAMIN D 25 HYDROXY (VIT D DEFICIENCY, FRACTURES): VITD: 52.49 ng/mL (ref 30.00–100.00)

## 2022-03-06 LAB — LDL CHOLESTEROL, DIRECT: Direct LDL: 84 mg/dL

## 2022-03-06 LAB — HEMOGLOBIN A1C: Hgb A1c MFr Bld: 5.9 % (ref 4.6–6.5)

## 2022-03-06 MED ORDER — PANTOPRAZOLE SODIUM 40 MG PO TBEC: 40.0000 mg | DELAYED_RELEASE_TABLET | Freq: Every day | ORAL | 1 refills | Status: DC

## 2022-03-06 MED ORDER — CELECOXIB 200 MG PO CAPS: 200.0000 mg | ORAL_CAPSULE | Freq: Two times a day (BID) | ORAL | 0 refills | Status: DC

## 2022-03-06 MED ORDER — PREGABALIN 150 MG PO CAPS: 150.0000 mg | ORAL_CAPSULE | Freq: Two times a day (BID) | ORAL | 1 refills | Status: DC

## 2022-03-06 NOTE — Progress Notes (Signed)
Established Patient Office Visit  Subjective   Patient ID: Stephen Anderson, male    DOB: [birth year]  Age: 81 y.o. MRN: [id number]  Chief Complaint  Patient presents with   Medication Refill    Pt states he hasn't been taking his meds for a few weeks    Depression    PHQ9 - 50    HPI  81 year old male presents today for a recheck of his chronic health conditions. He has a history of GERD, B12 and Vitamin D deficiency, hyperlipidemia, hyperglycemia, OSA, and RLS. Patient reports he has been doing well. Exercises twice per week at the [facility] and stays active by continuing to work. He has been out of a few of his medications for about a week. Uses his CPAP nightly and is able to get a great night of sleep. Overall his mood is great. No feeling of helplessness, hopelessness, no thoughts of death or dying.  Patient reports attempting to donate blood and was turned away twice because they told him his hemoglobin was too low. He denies any known bleeding. Last colonoscopy was at age 44.    Also reports a history of bilateral inguinal hernia repair. He believes the hernia is back on the right side. No pain. Only see a bulge. Does not want to be referred to urology for repair or evaluation.   Review of Systems  Constitutional:  Negative for diaphoresis, fever and malaise/fatigue.       Reports he gets cold quickly  HENT: Negative.    Eyes: Negative.   Respiratory: Negative.    Cardiovascular: Negative.  Negative for chest pain and palpitations.  Gastrointestinal: Negative.   Genitourinary: Negative.   Musculoskeletal: Negative.   Skin: Negative.   Neurological: Negative.   Endo/Heme/Allergies: Negative.   Psychiatric/Behavioral: Negative.  Negative for depression. The patient is not nervous/anxious.   All other systems reviewed and are negative.    Objective:     BP 124/78   Pulse 66   Temp 98 F (36.7 C)   Ht '5\' 10"'$  (1.778 m)   Wt 178 lb 12.8 oz (81.1 kg)   SpO2 96%   BMI 25.66  kg/m  BP Readings from Last 3 Encounters:  03/06/22 124/78  10/26/21 128/68  09/21/21 128/74   Wt Readings from Last 3 Encounters:  03/06/22 178 lb 12.8 oz (81.1 kg)  10/26/21 168 lb (76.2 kg)  09/21/21 176 lb 2 oz (79.9 kg)      Physical Exam Vitals and nursing note reviewed. Exam conducted with a chaperone present.  Constitutional:      Appearance: Normal appearance. He is normal weight.  HENT:     Right Ear: Tympanic membrane and ear canal normal.     Left Ear: Tympanic membrane and ear canal normal.  Cardiovascular:     Rate and Rhythm: Normal rate and regular rhythm.     Pulses: Normal pulses.     Heart sounds: Normal heart sounds.  Pulmonary:     Effort: Pulmonary effort is normal.     Breath sounds: Normal breath sounds.  Abdominal:     General: Abdomen is flat. Bowel sounds are normal.     Palpations: Abdomen is soft. There is no mass.     Tenderness: There is no rebound.     Hernia: No hernia is present.  Genitourinary:      Comments: Right inguinal hernia. Reducible and non-tender.  Musculoskeletal:        General: Normal range of  motion.     Cervical back: Normal range of motion and neck supple.  Skin:    General: Skin is warm and dry.  Neurological:     Mental Status: He is alert. Mental status is at baseline.  Psychiatric:        Mood and Affect: Mood normal.        Behavior: Behavior normal.    No results found for any visits on 03/06/22.    The 10-year ASCVD risk score (Arnett DK, et al., 2019) is: 28.7%    Assessment & Plan:   Problem List Items Addressed This Visit     RLS (restless legs syndrome)   Relevant Orders   Vitamin D (25 hydroxy)   IBC + Ferritin   Osteoarthritis, multiple sites   Relevant Medications   aspirin EC 81 MG tablet   celecoxib (CELEBREX) 200 MG capsule   Other Relevant Orders   Vitamin B12   Vitamin D (25 hydroxy)   IBC + Ferritin   Mixed hyperlipidemia - Primary   Relevant Medications   aspirin EC 81 MG  tablet   atorvastatin (LIPITOR) 40 MG tablet   Other Relevant Orders   Hemoglobin A1c   Lipid panel   Idiopathic peripheral neuropathy   Relevant Medications   gabapentin (NEURONTIN) 300 MG capsule   pregabalin (LYRICA) 150 MG capsule   Other Relevant Orders   IBC + Ferritin   GERD (gastroesophageal reflux disease)   Relevant Medications   pantoprazole (PROTONIX) 40 MG tablet   pantoprazole (PROTONIX) 40 MG tablet   Other Relevant Orders   CBC with Differential/Platelets   B12 deficiency   Relevant Orders   Vitamin B12   Vitamin D (25 hydroxy)   Other Visit Diagnoses     History of elevated glucose       Relevant Orders   Hemoglobin R4Y   Basic Metabolic Panel (BMET)   Right inguinal hernia         Declines referral for hernia. Will consider colonoscopy if hemoglobin is low.  Medications renewed. Encouraged to schedule an appt with new PCP Continue exercising and following a heart healthy diet.  Labs obtained will notify patient pending results.   Return in about 6 months (around 09/04/2022).    Kennyth Arnold, FNP

## 2022-04-05 ENCOUNTER — Encounter: Payer: Self-pay | Admitting: Internal Medicine

## 2022-04-05 ENCOUNTER — Other Ambulatory Visit (INDEPENDENT_AMBULATORY_CARE_PROVIDER_SITE_OTHER): Payer: PPO

## 2022-04-05 ENCOUNTER — Ambulatory Visit (INDEPENDENT_AMBULATORY_CARE_PROVIDER_SITE_OTHER): Payer: PPO | Admitting: Internal Medicine

## 2022-04-05 VITALS — BP 129/59 | HR 55 | Temp 97.9°F | Ht 70.0 in | Wt 170.0 lb

## 2022-04-05 DIAGNOSIS — R5382 Chronic fatigue, unspecified: Secondary | ICD-10-CM

## 2022-04-05 DIAGNOSIS — R0602 Shortness of breath: Secondary | ICD-10-CM | POA: Diagnosis not present

## 2022-04-05 DIAGNOSIS — Z862 Personal history of diseases of the blood and blood-forming organs and certain disorders involving the immune mechanism: Secondary | ICD-10-CM

## 2022-04-05 DIAGNOSIS — Z87891 Personal history of nicotine dependence: Secondary | ICD-10-CM | POA: Diagnosis not present

## 2022-04-05 DIAGNOSIS — G609 Hereditary and idiopathic neuropathy, unspecified: Secondary | ICD-10-CM

## 2022-04-05 DIAGNOSIS — M159 Polyosteoarthritis, unspecified: Secondary | ICD-10-CM | POA: Diagnosis not present

## 2022-04-05 DIAGNOSIS — Z79899 Other long term (current) drug therapy: Secondary | ICD-10-CM

## 2022-04-05 DIAGNOSIS — R1011 Right upper quadrant pain: Secondary | ICD-10-CM | POA: Diagnosis not present

## 2022-04-05 DIAGNOSIS — Z0000 Encounter for general adult medical examination without abnormal findings: Secondary | ICD-10-CM

## 2022-04-05 DIAGNOSIS — E782 Mixed hyperlipidemia: Secondary | ICD-10-CM | POA: Diagnosis not present

## 2022-04-05 DIAGNOSIS — R634 Abnormal weight loss: Secondary | ICD-10-CM | POA: Diagnosis not present

## 2022-04-05 DIAGNOSIS — E559 Vitamin D deficiency, unspecified: Secondary | ICD-10-CM | POA: Diagnosis not present

## 2022-04-05 DIAGNOSIS — R0789 Other chest pain: Secondary | ICD-10-CM | POA: Diagnosis not present

## 2022-04-05 DIAGNOSIS — Z125 Encounter for screening for malignant neoplasm of prostate: Secondary | ICD-10-CM | POA: Diagnosis not present

## 2022-04-05 DIAGNOSIS — M150 Primary generalized (osteo)arthritis: Secondary | ICD-10-CM

## 2022-04-05 HISTORY — DX: Other chest pain: R07.89

## 2022-04-05 HISTORY — DX: Other long term (current) drug therapy: Z79.899

## 2022-04-05 HISTORY — DX: Abnormal weight loss: R63.4

## 2022-04-05 MED ORDER — PREGABALIN 200 MG PO CAPS: 200.0000 mg | ORAL_CAPSULE | Freq: Two times a day (BID) | ORAL | 1 refills | Status: DC

## 2022-04-05 NOTE — Patient Instructions (Signed)
It was a pleasure seeing you today!  Your health and satisfaction are my top priorities. If you believe your experience today was worthy of a 5-star rating, I'd be grateful for your feedback! Loralee Pacas, MD   CHECKOUT CHECKLIST  '[]'$    Schedule next appointment(s):    Return in about 1 month (around 05/06/2022) for review problems and medications go over recent testing and see if any weight loss.  Any requested lab visits should be scheduled as appointments too  If you are not doing well:  Return to the office sooner Please bring all your medicine bottles to each appointment If your condition begins to worsen or become severe:  go to the emergency room or even call 911 '[]'$   Start taking atorvastatin 40 mg daily    '[]'$   (Optional):  Review your clinical notes on MyChart after they are completed.     Today's draft of the physician documented plan for today's visit: (final revisions will be visible on MyChart chart later) Preventative health care -     PSA  Unintentional weight loss Assessment & Plan: Lost weight despite eating over holidays Return to clinic 1 month(s) see if continued heavy weight loss   Orders: -     Ambulatory referral to Gastroenterology -     CT CHEST WO CONTRAST; Future  Idiopathic peripheral neuropathy -     Pregabalin; Take 1 capsule (200 mg total) by mouth 2 (two) times daily.  Dispense: 180 capsule; Refill: 1  Chronic fatigue -     CT CHEST WO CONTRAST; Future  Primary osteoarthritis involving multiple joints  Mixed hyperlipidemia Assessment & Plan: Continue with aspirin 81 q0d  Continue with atorvastatin but increase to 4x weekly to daily   Chest discomfort -     Ambulatory referral to Cardiology -     CT CHEST WO CONTRAST; Future  Former smoker -     CT CHEST WO CONTRAST; Future  RUQ pain -     US ABDOMEN LIMITED RUQ (LIVER/GB); Future  History of anemia  Vitamin D deficiency  Shortness of breath -     CT CHEST WO CONTRAST;  Future      QUESTIONS & CONCERNS: CLINICAL: please contact us via phone [phone] OR MyChart messaging  LAB & IMAGING:   We will call you if the results are significantly abnormal or you don't use MyChart.  Most normal results will be posted to MyChart immediately and have a clinical review message by Dr. Randol Kern posted within 2-3 business days.   If you have not heard from [place] regarding the results in 2 weeks OR if you need priority reporting, please contact this office. MYCHART:  The fastest way to get your results and easiest way to stay in touch with [place] is by activating your My Chart account. Instructions are located on the last page of this paperwork.  BILLING: xray and lab orders are billed from separate companies and questions./concerns should be directed to the [facility].  For visit charges please discuss with our administrative services COMPLAINTS:  please let Dr. Randol Kern know or see the [facility], by asking at the front desk: we want you to be satisfied with every experience and we would be grateful for the opportunity to address any problems

## 2022-04-05 NOTE — Assessment & Plan Note (Signed)
Continue with aspirin 81 q0d  Continue with atorvastatin but increase to 4x weekly to daily

## 2022-04-05 NOTE — Assessment & Plan Note (Signed)
Continue with Lyrica but increase dose due to poor control 150->200 mg PDMP reviewed during this encounter. We had a lot of other pressing issues so I took this over without much discussion

## 2022-04-05 NOTE — Assessment & Plan Note (Signed)
Lost weight despite eating over holidays Return to clinic 1 month(s) see if continued heavy weight loss

## 2022-04-05 NOTE — Progress Notes (Signed)
Stephen Anderson  Phone: [phone]  New patient visit  Visit Date: 04/05/2022 Patient: Stephen Anderson   DOB: [birth year]   81 y.o. Male  MRN: [id number]  Today's healthcare provider: Loralee Pacas, MD  Assessment and Plan:   [name] is a nice gentleman with a list of potentially serious problems presenting today to transfer Primary Care Provider (PCP) to me.  He would like a full evaluation and workup   Stephen Anderson was seen today for transfer of care., shortness of breath, chest pain, right side pain, low hemoglobin, peripheral neuropathy and weight loss concern.  Preventative health care -     PSA  Unintentional weight loss Overview: Wt Readings from Last 20 Encounters:  04/05/22 170 lb (77.1 kg)  03/06/22 178 lb 12.8 oz (81.1 kg)  10/26/21 168 lb (76.2 kg)  09/21/21 176 lb 2 oz (79.9 kg)  07/05/21 182 lb 6.4 oz (82.7 kg)  06/27/21 179 lb (81.2 kg)  06/01/21 178 lb 12.8 oz (81.1 kg)  01/10/21 174 lb 6.4 oz (79.1 kg)  12/19/20 171 lb (77.6 kg)  08/29/20 178 lb 6.4 oz (80.9 kg)  06/15/20 184 lb 12.8 oz (83.8 kg)  06/02/20 183 lb 3.2 oz (83.1 kg)  05/24/20 178 lb (80.7 kg)  04/15/20 181 lb 6.4 oz (82.3 kg)  11/20/19 172 lb 3.2 oz (78.1 kg)  05/21/19 185 lb (83.9 kg)  04/24/19 186 lb 9.6 oz (84.6 kg)  03/20/19 185 lb 12.8 oz (84.3 kg)  01/14/19 184 lb 9.6 oz (83.7 kg)  09/10/18 183 lb (83 kg)     Assessment & Plan: Lost weight despite eating over holidays Return to clinic 1 month(s) see if continued heavy weight loss   Orders: -     Ambulatory referral to Gastroenterology -     CT CHEST WO CONTRAST; Future  Idiopathic peripheral neuropathy Overview: Evaluated by Stephen Anderson 2019. Gabapentin and lidocaine patches recommended From toes to knees bilaterally Stephen Anderson switched gabapentin to Lyrica He uses lidocaine.   Orders: -     Pregabalin; Take 1 capsule (200 mg total) by mouth 2 (two) times daily.  Dispense: 180 capsule; Refill:  1  Chronic fatigue Overview: Reports worsening Anemia B12 and vitamin D corrected in 02/2022 but fatigue got worse   Orders: -     CT CHEST WO CONTRAST; Future  Primary osteoarthritis involving multiple joints Overview: Uses Voltaren topical And celebrex   Mixed hyperlipidemia Overview: Lipid Panel     Component Value Date/Time   CHOL 152 03/06/2022 0844   TRIG 214.0 (H) 03/06/2022 0844   HDL 38.00 (L) 03/06/2022 0844   CHOLHDL 4 03/06/2022 0844   VLDL 42.8 (H) 03/06/2022 0844   LDLCALC 64 06/27/2021 0858   LDLDIRECT 84.0 03/06/2022 0844     Assessment & Plan: Continue with aspirin 81 q0d  Continue with atorvastatin but increase to 4x weekly to daily   Chest discomfort Overview: Associated with fatigue worsening 02/2022  Orders: -     Ambulatory referral to Cardiology -     CT CHEST WO CONTRAST; Future  Former smoker Overview: Smoked heavily many years but quit around age 42  Orders: -     [place]; Future  RUQ pain -     US ABDOMEN LIMITED RUQ (LIVER/GB); Future  History of anemia  Vitamin D deficiency  Shortness of breath -     CT CHEST WO CONTRAST; Future    Subjective:  Patient presents today to establish  care.  Chief Complaint  Patient presents with   Transfer of care.   Shortness of Breath   Chest Pain    Intermittent on left side-wants to check heart.   Right side pain    Under rib cage area.   Low hemoglobin   Peripheral Neuropathy    Knees to toes.   Weight loss concern    Has lost 12 pounds quickly-(works out at [facility]).    Problem-oriented charting was used to develop and update his medical history: Problem  Unintentional Weight Loss   Wt Readings from Last 20 Encounters:  04/05/22 170 lb (77.1 kg)  03/06/22 178 lb 12.8 oz (81.1 kg)  10/26/21 168 lb (76.2 kg)  09/21/21 176 lb 2 oz (79.9 kg)  07/05/21 182 lb 6.4 oz (82.7 kg)  06/27/21 179 lb (81.2 kg)  06/01/21 178 lb 12.8 oz (81.1 kg)  01/10/21 174 lb 6.4  oz (79.1 kg)  12/19/20 171 lb (77.6 kg)  08/29/20 178 lb 6.4 oz (80.9 kg)  06/15/20 184 lb 12.8 oz (83.8 kg)  06/02/20 183 lb 3.2 oz (83.1 kg)  05/24/20 178 lb (80.7 kg)  04/15/20 181 lb 6.4 oz (82.3 kg)  11/20/19 172 lb 3.2 oz (78.1 kg)  05/21/19 185 lb (83.9 kg)  04/24/19 186 lb 9.6 oz (84.6 kg)  03/20/19 185 lb 12.8 oz (84.3 kg)  01/14/19 184 lb 9.6 oz (83.7 kg)  09/10/18 183 lb (83 kg)      Chest Discomfort   Associated with fatigue worsening 02/2022   Former Smoker   Smoked heavily many years but quit around age 31   Chronic Fatigue   Reports worsening Anemia B12 and vitamin D corrected in 02/2022 but fatigue got worse    Mixed Hyperlipidemia   Lipid Panel     Component Value Date/Time   CHOL 152 03/06/2022 0844   TRIG 214.0 (H) 03/06/2022 0844   HDL 38.00 (L) 03/06/2022 0844   CHOLHDL 4 03/06/2022 0844   VLDL 42.8 (H) 03/06/2022 0844   LDLCALC 64 06/27/2021 0858   LDLDIRECT 84.0 03/06/2022 0844      Osteoarthritis, Multiple Sites   Uses Voltaren topical And celebrex   Idiopathic Peripheral Neuropathy   Evaluated by Stephen Anderson 2019. Gabapentin and lidocaine patches recommended From toes to knees bilaterally Stephen Anderson switched gabapentin to Lyrica He uses lidocaine.       Depression Screen    03/06/2022    8:13 AM 10/26/2021    7:55 AM 09/21/2021    8:21 AM 06/27/2021    8:16 AM  PHQ 2/9 Scores  PHQ - 2 Score 1 1 0 0  PHQ- 9 Score 5 7 0 0   No results found for any visits on 04/05/22.  The following were reviewed and entered/updated into his MEDICAL RECORD [facility] Medical History:  Diagnosis Date   Atypical mole 02/24/2014   severe-upper left paraspinal (WS)   Atypical mole 03/25/2014   persistently dysplastic nevi- upper left paraspinal (EXC)   Chest discomfort 04/05/2022   Associated with fatigue worsening 02/2022   Essential tremor    GERD (gastroesophageal reflux disease) 02/24/2018   History of melanoma 03/26/2018   S/p excision on  back   Hypercholesteremia    Mixed hyperlipidemia 02/24/2018   Peripheral neuropathy    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   well diff-right hand- txpbx   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 (CX35FU)   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  in situ-right parotid area   Unintentional weight loss 04/05/2022   Wt Readings from Last 20 Encounters: 04/05/22 170 lb (77.1 kg) 03/06/22 178 lb 12.8 oz (81.1 kg) 10/26/21 168 lb (76.2 kg) 09/21/21 176 lb 2 oz (79.9 kg) 07/05/21 182 lb 6.4 oz (82.7 kg) 06/27/21 179 lb (81.2 kg) 06/01/21 178 lb 12.8 oz (81.1 kg) 01/10/21 174 lb 6.4 oz (79.1 kg) 12/19/20 171 lb (77.6 kg) 08/29/20 178 lb 6.4 oz (80.9 kg) 06/15/20 184 lb 12.8 oz (83.8 kg) 06/02/20 183 lb   Past Surgical History:  Procedure Laterality Date   CATARACT EXTRACTION, BILATERAL     GALLBLADDER SURGERY     HERNIA REPAIR     bilateral   MELANOMA EXCISION     Family History  Problem Relation Age of Onset   Lung cancer Father    Bladder Cancer Brother    Heart disease Brother    Neuropathy Daughter    Outpatient Medications Prior to Visit  Medication Sig Dispense Refill   Acetaminophen (TYLENOL ARTHRITIS EXT RELIEF PO) Take 1 tablet by mouth every other day.     aspirin 81 MG tablet Take 81 mg by mouth every other day.      atorvastatin (LIPITOR) 40 MG tablet TAKE ONE TABLET BY MOUTH AT BEDTIME 90 tablet 3   celecoxib (CELEBREX) 200 MG capsule Take 1 capsule (200 mg total) by mouth 2 (two) times daily. 180 capsule 0   Cholecalciferol (VITAMIN D-3) 125 MCG (5000 UT) TABS Take 1 tablet by mouth daily.     Cyanocobalamin (B-12 PO) Take 1 tablet by mouth daily.     diclofenac Sodium (VOLTAREN) 1 % GEL Apply 2 g topically in the morning and at bedtime. Every morning and every night.     fluticasone (CUTIVATE) 1.61 % cream 1 application twice a day by topical route.     fluticasone (FLONASE) 50 MCG/ACT nasal spray Place 1 spray into both  nostrils daily. 16 g 6   ipratropium (ATROVENT) 0.06 % nasal spray Place 1-2 sprays into both nostrils 4 (four) times daily. As needed for nasal congestion 15 mL 5   ketoconazole (NIZORAL) 2 % shampoo Apply to affected area- let sit 3-5 minutes before rinsing 120 mL 5   NON FORMULARY CPAP     pantoprazole (PROTONIX) 40 MG tablet Take 1 tablet (40 mg total) by mouth daily. 90 tablet 1   primidone (MYSOLINE) 50 MG tablet TAKE ONE TABLET BY MOUTH EVERY MORNING AND ONE TABLET EVERY EVENING 180 tablet 3   Zoster Vaccine Adjuvanted [facility]) injection      pregabalin (LYRICA) 150 MG capsule Take 1 capsule (150 mg total) by mouth 2 (two) times daily. 180 capsule 1   UNABLE TO FIND Apply 1 application  topically 3 times/day as needed-between meals & bedtime. [place] Oil-every morning and night.     Clobetasol Propionate 0.05 % shampoo  (Patient not taking: Reported on 04/05/2022)     gabapentin (NEURONTIN) 300 MG capsule Take 1 capsule by mouth 3 (three) times daily. (Patient not taking: Reported on 04/05/2022)     aspirin EC 81 MG tablet      atorvastatin (LIPITOR) 40 MG tablet Take 1 tablet by mouth at bedtime.     pantoprazole (PROTONIX) 40 MG tablet Take 1 tablet by mouth daily.     No facility-administered medications prior to visit.    Allergies  Allergen Reactions   Hydrocodone Other (See Comments)   Oxycodone Other (See Comments)   Oxycontin [Oxycodone  Hcl] Hives   Social History   Tobacco Use   Smoking status: Former    Packs/day: 1.00    Years: 50.00    Total pack years: 50.00    Types: Cigarettes    Quit date: 04/06/2008    Years since quitting: 14.0   Smokeless tobacco: Former    Quit date: 05/16/2002  Vaping Use   Vaping Use: Never used  Substance Use Topics   Alcohol use: No    Alcohol/week: 0.0 standard drinks of alcohol   Drug use: No    Immunization History  Administered Date(s) Administered   Fluad Quad(high Dose 65+) 01/14/2019   PFIZER(Purple Top)SARS-COV-2  Vaccination 05/04/2019, 05/25/2019, 01/17/2020   Pneumococcal Conjugate-13 10/21/2013   Pneumococcal Polysaccharide-23 07/17/2010   Td 01/07/2009   Tdap 07/10/2010   Zoster Recombinat (Shingrix) 04/03/2017, 06/17/2017   Zoster, Live 03/16/2017    Objective:  BP (!) 129/59 (BP Location: Left Arm, Patient Position: Standing)   Pulse (!) 55   Temp 97.9 F (36.6 C) (Temporal)   Ht '5\' 10"'$  (1.778 m)   Wt 170 lb (77.1 kg)   SpO2 98%   BMI 24.39 kg/m  Body mass index is 24.39 kg/m.  Physical Exam  Vital signs reviewed.  Nursing notes reviewed. General Appearance/Constitutional:  Well developed, well nourished male in no acute distress Musculoskeletal: All extremities are intact.  Neurological:  Awake, alert,  No obvious focal neurological deficits or cognitive impairments Psychiatric:  Appropriate mood, pleasant demeanor Problem-specific findings:  tenderness ruq just under right rib cage, lungs ctab w/normal work of breathing, heart sounds are faint- possible secondary to barrel chested rib cage.  Abdomen has some tightness in the epigastric region without tenderness- I don't think there is a mass but rather this is secondary to large rib cage.    Results Reviewed: I went through all this labwork with the patient in detail Results for orders placed or performed in visit on 03/06/22  Hemoglobin A1c  Result Value Ref Range   Hgb A1c MFr Bld 5.9 4.6 - 6.5 %  Basic Metabolic Panel (BMET)  Result Value Ref Range   Sodium 141 135 - 145 mEq/L   Potassium 4.7 3.5 - 5.1 mEq/L   Chloride 104 96 - 112 mEq/L   CO2 30 19 - 32 mEq/L   Glucose, Bld 94 70 - 99 mg/dL   BUN 25 (H) 6 - 23 mg/dL   Creatinine, Ser 1.32 0.40 - 1.50 mg/dL   GFR 51.22 (L) >60.00 mL/min   Calcium 8.9 8.4 - 10.5 mg/dL  Vitamin B12  Result Value Ref Range   Vitamin B-12 582 211 - 911 pg/mL  Vitamin D (25 hydroxy)  Result Value Ref Range   VITD 52.49 30.00 - 100.00 ng/mL  CBC with Differential/Platelets  Result  Value Ref Range   WBC 6.4 4.0 - 10.5 K/uL   RBC 4.79 4.22 - 5.81 Mil/uL   Hemoglobin 13.9 13.0 - 17.0 g/dL   HCT 42.6 39.0 - 52.0 %   MCV 88.9 78.0 - 100.0 fl   MCHC 32.6 30.0 - 36.0 g/dL   RDW 16.2 (H) 11.5 - 15.5 %   Platelets 177.0 150.0 - 400.0 K/uL   Neutrophils Relative % 57.8 43.0 - 77.0 %   Lymphocytes Relative 29.5 12.0 - 46.0 %   Monocytes Relative 9.4 3.0 - 12.0 %   Eosinophils Relative 2.9 0.0 - 5.0 %   Basophils Relative 0.4 0.0 - 3.0 %   Neutro Abs 3.7 1.4 -  7.7 K/uL   Lymphs Abs 1.9 0.7 - 4.0 K/uL   Monocytes Absolute 0.6 0.1 - 1.0 K/uL   Eosinophils Absolute 0.2 0.0 - 0.7 K/uL   Basophils Absolute 0.0 0.0 - 0.1 K/uL  IBC + Ferritin  Result Value Ref Range   Iron 77 42 - 165 ug/dL   Transferrin 248.0 212.0 - 360.0 mg/dL   Saturation Ratios 22.2 20.0 - 50.0 %   Ferritin 19.4 (L) 22.0 - 322.0 ng/mL   TIBC 347.2 250.0 - 450.0 mcg/dL  Lipid panel  Result Value Ref Range   Cholesterol 152 0 - 200 mg/dL   Triglycerides 214.0 (H) 0.0 - 149.0 mg/dL   HDL 38.00 (L) >39.00 mg/dL   VLDL 42.8 (H) 0.0 - 40.0 mg/dL   Total CHOL/HDL Ratio 4    NonHDL 114.25   LDL cholesterol, direct  Result Value Ref Range   Direct LDL 84.0 mg/dL

## 2022-04-06 LAB — PSA: PSA: 1.14 ng/mL (ref 0.10–4.00)

## 2022-04-11 ENCOUNTER — Other Ambulatory Visit: Payer: Self-pay | Admitting: Internal Medicine

## 2022-04-11 DIAGNOSIS — K625 Hemorrhage of anus and rectum: Secondary | ICD-10-CM

## 2022-04-17 ENCOUNTER — Other Ambulatory Visit (INDEPENDENT_AMBULATORY_CARE_PROVIDER_SITE_OTHER): Payer: PPO

## 2022-04-17 ENCOUNTER — Other Ambulatory Visit: Payer: Self-pay | Admitting: Internal Medicine

## 2022-04-17 DIAGNOSIS — K625 Hemorrhage of anus and rectum: Secondary | ICD-10-CM | POA: Diagnosis not present

## 2022-04-17 DIAGNOSIS — Z1212 Encounter for screening for malignant neoplasm of rectum: Secondary | ICD-10-CM

## 2022-04-17 LAB — FECAL OCCULT BLOOD, IMMUNOCHEMICAL: Fecal Occult Bld: NEGATIVE

## 2022-04-18 ENCOUNTER — Encounter (HOSPITAL_BASED_OUTPATIENT_CLINIC_OR_DEPARTMENT_OTHER): Payer: Self-pay | Admitting: Cardiology

## 2022-04-18 ENCOUNTER — Ambulatory Visit (INDEPENDENT_AMBULATORY_CARE_PROVIDER_SITE_OTHER): Payer: PPO | Admitting: Cardiology

## 2022-04-18 VITALS — BP 110/68 | HR 63 | Ht 70.0 in | Wt 177.0 lb

## 2022-04-18 DIAGNOSIS — Z7189 Other specified counseling: Secondary | ICD-10-CM | POA: Diagnosis not present

## 2022-04-18 DIAGNOSIS — I2510 Atherosclerotic heart disease of native coronary artery without angina pectoris: Secondary | ICD-10-CM | POA: Diagnosis not present

## 2022-04-18 DIAGNOSIS — R079 Chest pain, unspecified: Secondary | ICD-10-CM

## 2022-04-18 DIAGNOSIS — E782 Mixed hyperlipidemia: Secondary | ICD-10-CM | POA: Diagnosis not present

## 2022-04-18 NOTE — Patient Instructions (Signed)
Medication Instructions:  ?Your physician recommends that you continue on your current medications as directed. Please refer to the Current Medication list given to you today.  ? ?Labwork: ?NONE ? ?Testing/Procedures: ?NONE ? ?Follow-Up: ?AS NEEDED  ? ?  ?

## 2022-04-18 NOTE — Progress Notes (Signed)
Cardiology Office Note:    Date:  04/19/2022   ID:  BARBARA KENG, DOB 11-07-1940, MRN [id number]  PCP:  Loralee Pacas, MD  Cardiologist:  Buford Dresser, MD  Referring MD: Loralee Pacas, MD   CC: New patient evaluation for chest discomfort  History of Present Illness:    Stephen Anderson is a 82 y.o. male with a hx of hyperlipidemia, peripheral neuropathy, GERD, and melanoma, who is seen as a new consult at the request of Loralee Pacas, MD for the evaluation and management of chest discomfort.  He saw his PCP Dr. Randol Kern 04/05/2022 where he reported worsening fatigue associated with chest discomfort, so he was referred to cardiology for further evaluation. He was also concerned with unintentional weight loss despite eating over the holidays. Noted to be on Lyrica and lidocaine for peripheral neuropathy. Lyrica was increased to 200 mg due to poor control. Atorvastatin was increased to QD from 4x weekly.  Chest pain: -Initial onset: Right before Christmas he was feeling significantly fatigued, and he lost 12 lbs in 2 weeks. He had dark stools but didn't notice any blood. His left chest pain/discomfort has been intermittent for some time.  -Quality: Left-sided, difficult to describe -Frequency: Maybe twice in the past couple weeks. -Duration:  Sometimes it stays for a brief time.  -Associated symptoms:  He also complains of occasional right-sided abdominal tenderness/discomfort, possibly near his liver.  -Aggravating/alleviating factors: Soreness with palpation. Once in a blue moon he will experience the chest discomfort while exercising. -Prior cardiac history:  None. -Prior workup:  Coronary artery calcification noted on CT scan 06/2013. -Tobacco:  Former smoker for many years, quit around age 60  -Comorbidities:  Hyperlipidemia, peripheral neuropathy.  Endorses hiatal hernia and arthritis. Now on 200 mg pregabalin 2x daily which seems to be helping him. -Exercise  level:  Goes to the [facility] twice a week. He walks, then jogs until he feels that his legs will no longer support him. Since March, he has cut limbs off of trees, used [employer] and [employer], corporate, used Orth[employer]ist frequently. -Cardiac ROS: no shortness of breath, no PND, no orthopnea, no LE edema, no syncope -Family history: His younger brother had a heart attack at 71 yo. His father died of lung cancer at 62 yo.  Typically has eggs and bacon for breakfast which he cooks himself.   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)   Chest discomfort 04/05/2022   Associated with fatigue worsening 02/2022   Essential tremor    GERD (gastroesophageal reflux disease) 02/24/2018   High risk medication use 04/05/2022   Lyrica for neuropathy     History of melanoma 03/26/2018   S/p excision on back   Hypercholesteremia    Mixed hyperlipidemia 02/24/2018   Peripheral neuropathy    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   well diff-right hand- txpbx   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 (CX35FU)   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   in situ-right parotid area   Unintentional weight loss 04/05/2022   Wt Readings from Last 20 Encounters: 04/05/22 170 lb (77.1 kg) 03/06/22 178 lb 12.8 oz (81.1 kg) 10/26/21 168 lb (76.2 kg) 09/21/21 176 lb 2 oz (79.9 kg) 07/05/21 182 lb 6.4 oz (82.7 kg) 06/27/21 179 lb (81.2 kg) 06/01/21 178 lb 12.8 oz (  81.1 kg) 01/10/21 174 lb 6.4 oz (79.1 kg) 12/19/20 171 lb (77.6 kg) 08/29/20 178 lb 6.4 oz (80.9 kg) 06/15/20 184 lb 12.8 oz (83.8 kg) 06/02/20 183 lb    Past Surgical History:  Procedure Laterality Date   CATARACT EXTRACTION, BILATERAL     GALLBLADDER SURGERY     HERNIA REPAIR     bilateral   MELANOMA EXCISION      Current Medications: Current Outpatient Medications on  File Prior to Visit  Medication Sig   Acetaminophen (TYLENOL ARTHRITIS EXT RELIEF PO) Take 1 tablet by mouth every other day.   aspirin 81 MG tablet Take 81 mg by mouth every other day.    atorvastatin (LIPITOR) 40 MG tablet TAKE ONE TABLET BY MOUTH AT BEDTIME   celecoxib (CELEBREX) 200 MG capsule Take 1 capsule (200 mg total) by mouth 2 (two) times daily.   Cholecalciferol (VITAMIN D-3) 125 MCG (5000 UT) TABS Take 1 tablet by mouth daily.   Clobetasol Propionate 0.05 % shampoo    Cyanocobalamin (B-12 PO) Take 1 tablet by mouth daily.   diclofenac Sodium (VOLTAREN) 1 % GEL Apply 2 g topically in the morning and at bedtime. Every morning and every night.   fluticasone (CUTIVATE) 5.80 % cream 1 application twice a day by topical route.   fluticasone (FLONASE) 50 MCG/ACT nasal spray Place 1 spray into both nostrils daily.   ipratropium (ATROVENT) 0.06 % nasal spray Place 1-2 sprays into both nostrils 4 (four) times daily. As needed for nasal congestion   ketoconazole (NIZORAL) 2 % shampoo Apply to affected area- let sit 3-5 minutes before rinsing   NON FORMULARY CPAP   pantoprazole (PROTONIX) 40 MG tablet Take 1 tablet (40 mg total) by mouth daily.   pregabalin (LYRICA) 200 MG capsule Take 1 capsule (200 mg total) by mouth 2 (two) times daily.   primidone (MYSOLINE) 50 MG tablet TAKE ONE TABLET BY MOUTH EVERY MORNING AND ONE TABLET EVERY EVENING   Zoster Vaccine Adjuvanted [facility]) injection    No current facility-administered medications on file prior to visit.     Allergies:   Hydrocodone, Oxycodone, and Oxycontin [oxycodone hcl]   Social History   Tobacco Use   Smoking status: Former    Packs/day: 1.00    Years: 50.00    Total pack years: 50.00    Types: Cigarettes    Quit date: 04/06/2008    Years since quitting: 14.0   Smokeless tobacco: Former    Quit date: 05/16/2002  Vaping Use   Vaping Use: Never used  Substance Use Topics   Alcohol use: No    Alcohol/week: 0.0 standard  drinks of alcohol   Drug use: No    Family History: family history includes Bladder Cancer in his brother; Heart disease in his brother; Lung cancer in his father; Neuropathy in his daughter.  ROS:   Please see the history of present illness.  Additional pertinent ROS: Constitutional: Negative for chills, fever, night sweats, unintentional weight loss  HENT: Negative for ear pain and hearing loss.   Eyes: Negative for loss of vision and eye pain.  Respiratory: Negative for cough, sputum, wheezing.   Cardiovascular: See HPI. Gastrointestinal: Negative for abdominal pain, melena, and hematochezia.  Genitourinary: Negative for dysuria and hematuria.  Musculoskeletal: Negative for falls and myalgias.  Skin: Negative for itching and rash.  Neurological: Negative for focal weakness, focal sensory changes and loss of consciousness.  Endo/Heme/Allergies: Does not bruise/bleed easily.     EKGs/Labs/Other Studies Reviewed:  The following studies were reviewed today:  LE Arterial Doppler  10/23/2013: No evidence of segmental lower extremity arterial disease at rest, bilaterally.  Normal ABI's, bilaterally. Normal great toe pressures, bilaterally.  CT Abdomen/Pelvis  06/22/2013: IMPRESSION:  1. No findings to explain the patient's chronic worsening diarrhea.  2. Coronary artery calcification.  3. Small right inguinal hernia contains fat.   EKG:  EKG is personally reviewed.   04/18/2022:  NSR at 63 bpm  Recent Labs: 10/26/2021: ALT 9; TSH 1.43 03/06/2022: BUN 25; Creatinine, Ser 1.32; Hemoglobin 13.9; Platelets 177.0; Potassium 4.7; Sodium 141   Recent Lipid Panel    Component Value Date/Time   CHOL 152 03/06/2022 0844   TRIG 214.0 (H) 03/06/2022 0844   HDL 38.00 (L) 03/06/2022 0844   CHOLHDL 4 03/06/2022 0844   VLDL 42.8 (H) 03/06/2022 0844   LDLCALC 64 06/27/2021 0858   LDLDIRECT 84.0 03/06/2022 0844    Physical Exam:    VS:  BP 110/68 (BP Location: Right Arm, Patient  Position: Sitting, Cuff Size: Normal)   Pulse 63   Ht '5\' 10"'$  (1.778 m)   Wt 177 lb (80.3 kg)   BMI 25.40 kg/m     Wt Readings from Last 3 Encounters:  04/18/22 177 lb (80.3 kg)  04/05/22 170 lb (77.1 kg)  03/06/22 178 lb 12.8 oz (81.1 kg)    GEN: Well nourished, well developed in no acute distress HEENT: Normal, moist mucous membranes NECK: No JVD CARDIAC: regular rhythm, normal S1 and S2, no rubs or gallops. No murmur. VASCULAR: Radial and DP pulses 2+ bilaterally. No carotid bruits RESPIRATORY:  Clear to auscultation without rales, wheezing or rhonchi  ABDOMEN: Soft, non-tender, non-distended MUSCULOSKELETAL:  Ambulates independently. Muscle knot of left chest with reproducible tenderness on palpation. SKIN: Warm and dry, no edema NEUROLOGIC:  Alert and oriented x 3. No focal neuro deficits noted. PSYCHIATRIC:  Normal affect    ASSESSMENT:    1. Chest pain of uncertain etiology   2. Coronary artery calcification seen on CT scan   3. Mixed hyperlipidemia   4. Cardiac risk counseling   5. Counseling on health promotion and disease prevention    PLAN:    Chest pain -muscle knot/tenderness on anterior left chest wall on exam, which reproduced his pain  Coronary artery calcification on CT Mixed hyperlipidemia -continue atorvastatin, aspirin -reviewed red flag warning signs that need immediate medical attention  Cardiac risk counseling and prevention recommendations: -recommend heart healthy/Mediterranean diet, with whole grains, fruits, vegetable, fish, lean meats, nuts, and olive oil. Limit salt. -recommend moderate walking, 3-5 times/week for 30-50 minutes each session. Aim for at least 150 minutes.week. Goal should be pace of 3 miles/hours, or walking 1.5 miles in 30 minutes -recommend avoidance of tobacco products. Avoid excess alcohol. -ASCVD risk score: The 10-year ASCVD risk score (Arnett DK, et al., 2019) is: 25%   Values used to calculate the score:     Age: 6  years     Sex: Male     Is Non-Hispanic African American: No     Diabetic: No     Tobacco smoker: No     Systolic Blood Pressure: 295 mmHg     Is BP treated: No     HDL Cholesterol: 38 mg/dL     Total Cholesterol: 152 mg/dL    Plan for follow up: PRN.  Buford Dresser, MD, PhD, [facility]    Medication Adjustments/Labs and Tests Ordered: Current medicines are reviewed at  length with the patient today.  Concerns regarding medicines are outlined above.   Orders Placed This Encounter  Procedures   EKG 12-Lead   No orders of the defined types were placed in this encounter.  Patient Instructions  Medication Instructions:  Your physician recommends that you continue on your current medications as directed. Please refer to the Current Medication list given to you today.   Labwork: NONE  Testing/Procedures: NONE  Follow-Up: AS NEEDED       I,Mathew Stumpf,acting as a Education administrator for [employer], MD.,have documented all relevant documentation on the behalf of Buford Dresser, MD,as directed by  Buford Dresser, MD while in the presence of Buford Dresser, MD.  I, Buford Dresser, MD, have reviewed all documentation for this visit. The documentation on 04/19/22 for the exam, diagnosis, procedures, and orders are all accurate and complete.   Signed, Buford Dresser, MD PhD 04/19/2022 3:54 PM    [place]

## 2022-04-26 ENCOUNTER — Ambulatory Visit
Admission: RE | Admit: 2022-04-26 | Discharge: 2022-04-26 | Disposition: A | Discharge status: Home / Self Care | Payer: PPO | Source: Ambulatory Visit | Attending: Internal Medicine | Admitting: Internal Medicine

## 2022-04-26 DIAGNOSIS — N281 Cyst of kidney, acquired: Secondary | ICD-10-CM | POA: Diagnosis not present

## 2022-04-26 DIAGNOSIS — R1011 Right upper quadrant pain: Secondary | ICD-10-CM

## 2022-04-26 DIAGNOSIS — K760 Fatty (change of) liver, not elsewhere classified: Secondary | ICD-10-CM | POA: Diagnosis not present

## 2022-04-29 ENCOUNTER — Encounter: Payer: Self-pay | Admitting: Internal Medicine

## 2022-04-29 DIAGNOSIS — K760 Fatty (change of) liver, not elsewhere classified: Secondary | ICD-10-CM | POA: Insufficient documentation

## 2022-04-29 DIAGNOSIS — N281 Cyst of kidney, acquired: Secondary | ICD-10-CM | POA: Insufficient documentation

## 2022-04-29 NOTE — Progress Notes (Signed)
Ultrasound found no cause of right upper quadrant of abdomen pain.  It noted fatty liver disease and an incidental kidney cyst.  They suggested dedicated ultrasound for the kidney but I think we should consider doing CT abdomen and pelvis instead if right upper quadrant of abdomen pain is persisting.  That would be with contrast and require kidney lab checks within 30 days prior to the test.   Please call and see if he would like to do CT abdomen for persistent pain, or just ultrasound kidney for kidney cyst and order whatever one he wants. Offer for him to come for appointment to discuss further or do video appointment if he would like to discuss it in more detail

## 2022-05-01 ENCOUNTER — Other Ambulatory Visit: Payer: Self-pay

## 2022-05-01 DIAGNOSIS — R1011 Right upper quadrant pain: Secondary | ICD-10-CM

## 2022-05-02 ENCOUNTER — Ambulatory Visit
Admission: RE | Admit: 2022-05-02 | Discharge: 2022-05-02 | Disposition: A | Discharge status: Home / Self Care | Payer: PPO | Source: Ambulatory Visit | Attending: Internal Medicine | Admitting: Internal Medicine

## 2022-05-02 DIAGNOSIS — R0602 Shortness of breath: Secondary | ICD-10-CM

## 2022-05-02 DIAGNOSIS — J479 Bronchiectasis, uncomplicated: Secondary | ICD-10-CM | POA: Diagnosis not present

## 2022-05-02 DIAGNOSIS — I700 Atherosclerosis of aorta: Secondary | ICD-10-CM | POA: Diagnosis not present

## 2022-05-02 DIAGNOSIS — R0789 Other chest pain: Secondary | ICD-10-CM

## 2022-05-02 DIAGNOSIS — J439 Emphysema, unspecified: Secondary | ICD-10-CM | POA: Diagnosis not present

## 2022-05-02 DIAGNOSIS — R634 Abnormal weight loss: Secondary | ICD-10-CM

## 2022-05-02 DIAGNOSIS — Z87891 Personal history of nicotine dependence: Secondary | ICD-10-CM

## 2022-05-02 DIAGNOSIS — R0600 Dyspnea, unspecified: Secondary | ICD-10-CM | POA: Diagnosis not present

## 2022-05-02 DIAGNOSIS — R5382 Chronic fatigue, unspecified: Secondary | ICD-10-CM

## 2022-05-08 ENCOUNTER — Encounter: Payer: Self-pay | Admitting: Internal Medicine

## 2022-05-08 ENCOUNTER — Ambulatory Visit (INDEPENDENT_AMBULATORY_CARE_PROVIDER_SITE_OTHER): Payer: PPO | Admitting: Internal Medicine

## 2022-05-08 VITALS — BP 137/87 | HR 56 | Temp 98.1°F | Ht 70.0 in | Wt 173.6 lb

## 2022-05-08 DIAGNOSIS — R1011 Right upper quadrant pain: Secondary | ICD-10-CM | POA: Insufficient documentation

## 2022-05-08 DIAGNOSIS — I739 Peripheral vascular disease, unspecified: Secondary | ICD-10-CM | POA: Diagnosis not present

## 2022-05-08 DIAGNOSIS — N281 Cyst of kidney, acquired: Secondary | ICD-10-CM | POA: Diagnosis not present

## 2022-05-08 DIAGNOSIS — I731 Thromboangiitis obliterans [Buerger's disease]: Secondary | ICD-10-CM | POA: Insufficient documentation

## 2022-05-08 DIAGNOSIS — Z23 Encounter for immunization: Secondary | ICD-10-CM | POA: Diagnosis not present

## 2022-05-08 DIAGNOSIS — I7300 Raynaud's syndrome without gangrene: Secondary | ICD-10-CM

## 2022-05-08 HISTORY — DX: Right upper quadrant pain: R10.11

## 2022-05-08 LAB — CBC
HCT: 43.7 % (ref 39.0–52.0)
Hemoglobin: 14.8 g/dL (ref 13.0–17.0)
MCHC: 34 g/dL (ref 30.0–36.0)
MCV: 90.1 fl (ref 78.0–100.0)
Platelets: 171 10*3/uL (ref 150.0–400.0)
RBC: 4.85 Mil/uL (ref 4.22–5.81)
RDW: 14.6 % (ref 11.5–15.5)
WBC: 6.3 10*3/uL (ref 4.0–10.5)

## 2022-05-08 MED ORDER — NIFEDIPINE ER OSMOTIC RELEASE 30 MG PO TB24: 30.0000 mg | ORAL_TABLET | Freq: Every day | ORAL | 2 refills | Status: DC

## 2022-05-08 MED ORDER — CILOSTAZOL 50 MG PO TABS: 50.0000 mg | ORAL_TABLET | Freq: Two times a day (BID) | ORAL | 2 refills | Status: DC

## 2022-05-08 NOTE — Patient Instructions (Addendum)
Understanding Raynaud's Syndrome and Buerger's Disease Raynaud's Syndrome Raynaud's Syndrome is a disorder that affects small blood vessels in your fingers and toes. It may also affect blood vessels in your nose, lips, or ear lobes1.  Symptoms Cold fingers or toes Numbness Skin discoloration - the fingers and toes turn white or blue, upon exposure to cold or stress Upon warmth, skin color changes to red with throbbing pain2 Prevention Wear warm, comfortable clothing including socks2 During an attack, warm the hands and feet by rubbing them against each other or against a warm surface2 Try meditation or yoga to manage emotional stress2 Avoid medications that restrict blood flow such as migraine headache drugs3 Quit smoking -- smoking narrows your blood vessels and blocks blood flow3  Buerger's Disease Buerger's Disease is a condition characterized by swelling of small and medium-sized blood vessels and clot formation. Usually occurs in feet and hands, and may result in pain and tissue damage4.  Symptoms Pain in the legs, feet, or arms, and hands, which may come and go Numbness in the legs and hands Sores on toes and fingers Fingers and toes turn pale when exposed to cold environment Inflammation of veins4 Prevention Quit use of all tobacco containing products4 Avoid smoking as it narrows your blood vessels and blocks blood flow5 Caring for your feet and skin to prevent sores, ulcers, and infection3 Preventing Progression to Gangrene Gangrene is a serious condition that results from a lack of blood supply to an area of the body, often the extremities like fingers and toes. It can occur as a complication of both Raynaud's Syndrome and Buerger's Disease16. To prevent gangrene:  Maintain good foot and hand hygiene Regularly inspect the hands and feet for any signs of injury or infection Seek immediate medical attention if you notice persistent pain, sores, ulcers, or changes in skin  color7 Remember, the most effective way to manage these conditions and prevent complications is to quit smoking and all forms of tobacco use894. If you need help quitting, consider using nicotine replacement therapy aids or joining a smoking cessation program4.  Please consult with your healthcare provider for more personalized advice and treatment options. This handout is intended to provide general information and should not be used as a substitute for professional medical advice.   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   RUQ abdominal pain 05/08/2022   Raynaud's disease without gangrene 05/08/2022   Kidney cyst, acquired 04/29/2022   Fatty liver 04/29/2022   Unintentional weight loss 04/05/2022   Chest discomfort 04/05/2022   Former smoker 04/05/2022   High risk medication use 04/05/2022   Chronic fatigue 10/26/2021   RLS (restless legs syndrome) 09/21/2021   Right knee pain 09/15/2020   Pain in joint of right shoulder 09/15/2020   B12 deficiency 05/25/2019   Screening for colorectal cancer 05/21/2019   Insomnia 04/23/2018   History of melanoma 03/26/2018   GERD (gastroesophageal reflux disease) 02/24/2018   Mixed hyperlipidemia 02/24/2018   Osteoarthritis, multiple sites 02/24/2018   OSA on CPAP 12/30/2013   Essential tremor 05/16/2012   Idiopathic peripheral neuropathy 05/16/2012     Orders Placed This Encounter  Procedures   US Renal    Standing Status:   Future    Standing Expiration Date:   05/09/2023    Order Specific Question:   Reason for Exam (SYMPTOM  OR DIAGNOSIS REQUIRED)    Answer:   kidney cyst dedicated eval    Order  Specific Question:   Preferred imaging location?    Answer:   [facility]   US ARTERIAL ABI (SCREENING LOWER EXTREMITY)    Standing Status:   Future    Standing Expiration Date:   05/09/2023    Order Specific Question:   Reason for Exam (SYMPTOM  OR DIAGNOSIS  REQUIRED)    Answer:   raynauds    Order Specific Question:   Preferred imaging location?    Answer:   [facility] [address]   CBC    [place]   ANA   ANCA Profile   Ambulatory referral to Vascular Surgery    Referral Priority:   Routine    Referral Type:   Surgical    Referral Reason:   Specialty Services Required    Requested Specialty:   Vascular Surgery    Number of Visits Requested:   1   ECHOCARDIOGRAM COMPLETE    Standing Status:   Future    Standing Expiration Date:   08/07/2022    Order Specific Question:   Where should this test be performed    Answer:   [facility]    Order Specific Question:   Perflutren DEFINITY (image enhancing agent) should be administered unless hypersensitivity or allergy exist    Answer:   Administer Perflutren    Order Specific Question:   Reason for exam-Echo    Answer:   Other-Full Diagnosis List    Order Specific Question:   Full ICD-10/Reason for Exam    Answer:   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

## 2022-05-08 NOTE — Progress Notes (Signed)
[facility]: [phone]   Routine Medical Office Visit  Patient:  Stephen Anderson      Age: 82 y.o.       Sex:  male  Date:   05/08/2022  PCP:    Loralee Pacas, [place] Provider: Loralee Pacas, [place]   Problem Focused Charting:   Medical Decision Making per Assessment/Plan   Maleak was seen today for 1 month follow-up.  Raynaud's disease without gangrene Overview: Patients chart review and interview were used to generate a prompt for artificial intelligence analysis ([employer] artificial intelligence) clinical decision support.  AI output was reviewed and is provided in red:  Comprehensive Review of the Case: The patient is a 82 year old individual with a history of intermittent gastrointestinal bleeding who presents with intermittent numbing, cold, pale fingertips and toe tips that worsen in cold weather. The patient has a past history of smoking but quit 20 years ago. The affected fingers have low oxygen saturation on oximetry. There is no information provided regarding the patient's medications, allergies, social history, physical examination findings, laboratory data, illness course, or imaging data and other studies. Without these details, it is challenging to form a complete clinical picture; however, the provided information will be used to generate a differential diagnosis. The presence of additional symptoms, such as ulcerations, gangrene, or a history of thrombotic events, could further suggest certain diagnoses.  Most Likely Dx:  Raynaud's Phenomenon complicated by digital ischemia. The patient's symptoms of cold, pale, and numb extremities that worsen with cold exposure are characteristic of Raynaud's phenomenon. The low oxygen saturation in the affected fingers suggests that there is a significant reduction in blood flow, possibly leading to digital ischemia. The history of smoking, even though the patient quit 20 years ago, could have  contributed to vascular damage, exacerbating the condition. The presence of capillary refill time delay or digital ulcers could further suggest this diagnosis.  Expanded DDx:  Buerger's Disease (Thromboangiitis Obliterans): This condition could explain the patient's symptoms of cold, pale, and numb extremities, as well as the low oxygen saturation in the fingers. Buerger's disease is associated with smoking and can lead to ischemia in the extremities. The presence of migratory thrombophlebitis or distal extremity ischemia with corkscrew collaterals on angiography could further suggest this diagnosis.  Systemic Sclerosis (Scleroderma): Systemic sclerosis could account for the patient's symptoms due to its association with Raynaud's phenomenon and vascular complications leading to digital ischemia. The disease can cause fibrosis and microvascular damage, which may result in the symptoms described. The presence of skin thickening, sclerodactyly, or positive antinuclear antibodies (ANA) with specific scleroderma patterns could further suggest this diagnosis.  Alternative DDx:  Peripheral Arterial Disease (PAD): PAD could explain the patient's symptoms of cold, pale, and numb extremities, especially given the history of smoking, which is a significant risk factor. The low oxygen saturation in the fingers suggests impaired perfusion, which is consistent with PAD. The presence of diminished or absent peripheral pulses or an ankle-brachial index (ABI) less than 0.9 could further suggest this diagnosis.  Cryoglobulinemia: Cryoglobulinemia could cause the patient's symptoms due to the precipitation of cryoglobulins in the blood vessels, leading to vasospasm and ischemia in cold conditions. The presence of purpura, arthralgias, or renal impairment could further suggest this diagnosis.  Cholesterol Embolization Syndrome: This syndrome could explain the patient's symptoms due to cholesterol crystal embolization  from atherosclerotic plaques, leading to digital ischemia. The presence of livedo reticularis, blue toe syndrome, or eosinophilia could further  suggest this diagnosis.  Frostbite: Although less likely given the intermittent nature of the symptoms, frostbite could cause cold, pale, and numb extremities. The presence of a history of prolonged cold exposure and clear demarcation of affected tissue could further suggest this diagnosis.  Ergotism: Ergotism, caused by the ingestion of ergot-contaminated grains or medications, could lead to vasoconstriction and ischemia of the extremities. The presence of gastrointestinal symptoms or a history of consuming contaminated food or ergot-derived medications could further suggest this diagnosis.  Hyperviscosity Syndrome: This condition could lead to reduced blood flow and ischemia in the extremities. The presence of symptoms such as blurred vision, headaches, or evidence of increased serum viscosity could further suggest this diagnosis.  Connective Tissue Disease: Various connective tissue diseases can present with Raynaud's phenomenon and digital ischemia. The presence of specific autoantibodies or systemic symptoms such as joint pain or muscle weakness could further suggest this diagnosis.  Vasculitis: Vasculitis, such as in granulomatosis with polyangiitis or Churg-Strauss syndrome, could cause ischemic symptoms in the extremities. The presence of systemic symptoms like fever, weight loss, or hematuria could further suggest this diagnosis.  Assessment & Plan: Patients chart review and interview were used to generate a prompt for artificial intelligence analysis ([employer] artificial intelligence) clinical decision support.  AI output was reviewed and is provided in red:  #Intermittent Peripheral Ischemia  The patient is a 82 year old male with a history of intermittent gastrointestinal bleeding who now presents with symptoms suggestive of peripheral  ischemia, as evidenced by cold, pale fingertips and toe tips that numb intermittently and worsen with cold exposure. The history of smoking, despite cessation 20 years ago, contributes to the risk of vascular disease. The low oxygen saturation readings in the affected fingers indicate compromised blood flow. The differential diagnosis includes peripheral arterial disease (PAD), Raynaud's phenomenon, vasculitis, and embolic phenomena.  Dx: - Complete blood count (CBC) to assess for anemia or polycythemia. - Comprehensive metabolic panel (CMP) to evaluate for electrolyte imbalances and renal function. - Coagulation studies including PT/INR and aPTT to rule out coagulopathies. - Ankle-brachial index (ABI) to assess for peripheral arterial disease. - Doppler ultrasound of the extremities to evaluate blood flow and identify any arterial occlusions. - Echocardiogram to rule out cardiac sources of emboli. - Autoantibody panel including ANA, ANCA, and rheumatoid factor to evaluate for autoimmune conditions such as vasculitis. - Cold stimulation test for Raynaud's phenomenon.  Tx: - Avoidance of cold temperatures and smoking cessation support if needed. - Calcium channel blockers such as nifedipine to reduce the frequency and severity of Raynaud's attacks if diagnosed. - Antiplatelet agents such as aspirin to reduce the risk of thrombotic events in PAD. - Supervised exercise program to improve peripheral circulation. - Referral to a vascular specialist for further evaluation and management, including possible angioplasty or bypass surgery for PAD if indicated. - Management of underlying conditions such as autoimmune diseases or coagulopathies with appropriate medications if diagnosed.   See AVS for handout given on conservative mgmt  Orders: -     VAS [place] LOWER EXT ART SEG MULTI (SEGMENTALS & LE RAYNAUDS); Future -     NIFEdipine ER Osmotic Release; Take 1 tablet (30 mg total) by mouth daily.   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   Orders: -     Cilostazol; Take 1 tablet (50 mg total) by mouth 2 (two) times daily.  Dispense: 30 tablet; Refill: 2 -     VAS [place] LOWER EXT ART SEG MULTI (SEGMENTALS & LE RAYNAUDS); Future -     NIFEdipine ER Osmotic Release; Take 1 tablet (30 mg total) by mouth daily.  Dispense: 30 tablet; Refill: 2 -     US ARTERIAL ABI (SCREENING LOWER EXTREMITY); Future -     CBC -     ANA -     ANCA Profile -     ECHOCARDIOGRAM COMPLETE; Future -     Ambulatory referral to Vascular Surgery  RUQ abdominal pain Overview: We did ultrasound abdomen 04/26/22 to evaluate pain he has where it feels like right upper quadrant of abdomen pain flared by leaning forward feels like liver tip rolling over but that was negative  Assessment & Plan: Discussed pain and ultrasound findings negative - advised no further workup unless severe or worsening and he says its not and seem ok with not working on this further at this time.  Orders: -     VAS [place] LOWER EXT ART SEG MULTI (SEGMENTALS & LE RAYNAUDS); Future -     NIFEdipine ER Osmotic Release; Take 1 tablet (30 mg total) by mouth daily.  Dispense: 30 tablet; Refill: 2 -     US ARTERIAL ABI (SCREENING LOWER EXTREMITY); Future -     CBC -     ANA -     ANCA Profile -     ECHOCARDIOGRAM COMPLETE; Future -     Ambulatory referral to Vascular Surgery  Kidney cyst, acquired Overview: Incidental 1.6 cm lesion noted on 04/2022 ultrasound   Orders: -     US RENAL; Future  PAD (peripheral artery disease) ([facility]) -     VAS [place] LOWER EXT ART SEG MULTI (SEGMENTALS & LE RAYNAUDS); Future -     NIFEdipine  ER Osmotic Release; Take 1 tablet (30 mg total) by mouth daily.  Dispense: 30 tablet; Refill: 2 -     US ARTERIAL ABI (SCREENING LOWER EXTREMITY); Future -     CBC -     ANA -     ANCA Profile -     ECHOCARDIOGRAM COMPLETE; Future -     Ambulatory referral to Vascular Surgery  Need for Tdap vaccination -     Tdap vaccine greater than or equal to 7yo IM    Patient mostly came for follow up last months labs and imaging, which we did, but I became concerned about very severe Raynaud which I first noticed and added to chart today, and developed an extensive workup plan.  Due to extensive workup and other problems that need addressed still, will return to clinic 1 month   He has idiopathic peripheral neuropathy but I suspect its due to this vascular disease.   Subjective - Clinical Presentation:   Stephen Anderson is a 82 y.o. 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[place]) 05/08/2022   RUQ abdominal pain 05/08/2022   Raynaud's disease without gangrene 05/08/2022   Kidney cyst, acquired 04/29/2022   Fatty liver 04/29/2022   Unintentional weight loss 04/05/2022   Chest discomfort 04/05/2022   Former smoker 04/05/2022   High  risk medication use 04/05/2022   Chronic fatigue 10/26/2021   RLS (restless legs syndrome) 09/21/2021   Right knee pain 09/15/2020   Pain in joint of right shoulder 09/15/2020   B12 deficiency 05/25/2019   Screening for colorectal cancer 05/21/2019   Insomnia 04/23/2018   History of melanoma 03/26/2018   GERD (gastroesophageal reflux disease) 02/24/2018   Mixed hyperlipidemia 02/24/2018   Osteoarthritis, multiple sites 02/24/2018   OSA on CPAP 12/30/2013   Essential tremor 05/16/2012   Idiopathic peripheral neuropathy 05/16/2012   Past Medical History:  Diagnosis Date   Atypical mole 02/24/2014   severe-upper left paraspinal (WS)   Atypical mole 03/25/2014   persistently dysplastic nevi-  upper left paraspinal (EXC)   Chest discomfort 04/05/2022   Associated with fatigue worsening 02/2022   Essential tremor    GERD (gastroesophageal reflux disease) 02/24/2018   High risk medication use 04/05/2022   Lyrica for neuropathy     History of melanoma 03/26/2018   S/p excision on back   Hypercholesteremia    Mixed hyperlipidemia 02/24/2018   Peripheral neuropathy    RUQ abdominal pain 05/08/2022   We did ultrasound abdomen 04/26/22 to evaluate pain he has where it feels like right upper quadrant of abdomen pain flared by leaning forward feels like liver tip rolling over but that was negative   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   well diff-right hand- txpbx   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 ([id number])   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   in situ-right parotid area   Unintentional weight loss 04/05/2022   Wt Readings from Last 20 Encounters: 04/05/22 170 lb (77.1 kg) 03/06/22 178 lb 12.8 oz (81.1 kg) 10/26/21 168 lb (76.2 kg) 09/21/21 176 lb 2 oz (79.9 kg) 07/05/21 182 lb 6.4 oz (82.7 kg) 06/27/21 179 lb (81.2 kg) 06/01/21 178 lb 12.8 oz (81.1 kg) 01/10/21 174 lb 6.4 oz (79.1 kg) 12/19/20 171 lb (77.6 kg) 08/29/20 178 lb 6.4 oz (80.9 kg) 06/15/20 184 lb 12.8 oz (83.8 kg) 06/02/20 183 lb    Outpatient Medications Prior to Visit  Medication Sig   Acetaminophen (TYLENOL ARTHRITIS EXT RELIEF PO) Take 1 tablet by mouth every other day.   aspirin 81 MG tablet Take 81 mg by mouth every other day.    atorvastatin (LIPITOR) 40 MG tablet TAKE ONE TABLET BY MOUTH AT BEDTIME   celecoxib (CELEBREX) 200 MG capsule Take 1 capsule (200 mg total) by mouth 2 (two) times daily.   Cholecalciferol (VITAMIN D-3) 125 MCG (5000 UT) TABS Take 1 tablet by mouth daily.   Clobetasol Propionate 0.05 % shampoo    Cyanocobalamin (B-12 PO) Take 1 tablet by mouth daily.   diclofenac Sodium (VOLTAREN) 1 % GEL Apply 2 g topically in the morning and at  bedtime. Every morning and every night.   fluticasone (CUTIVATE) 6.29 % cream 1 application twice a day by topical route.   fluticasone (FLONASE) 50 MCG/ACT nasal spray Place 1 spray into both nostrils daily.   ipratropium (ATROVENT) 0.06 % nasal spray Place 1-2 sprays into both nostrils 4 (four) times daily. As needed for nasal congestion   ketoconazole (NIZORAL) 2 % shampoo Apply to affected area- let sit 3-5 minutes before rinsing   NON FORMULARY CPAP   pantoprazole (PROTONIX) 40 MG tablet Take 1 tablet (40 mg total) by mouth daily.   pregabalin (LYRICA) 200 MG capsule Take 1 capsule (200 mg total) by mouth 2 (two) times daily.  primidone (MYSOLINE) 50 MG tablet TAKE ONE TABLET BY MOUTH EVERY MORNING AND ONE TABLET EVERY EVENING   Zoster Vaccine Adjuvanted [facility]) injection    No facility-administered medications prior to visit.    Chief Complaint  Patient presents with   1 month follow-up    HPI  Right upper quadrant of abdomen abdomen pain stable. Feels like liver tip rolls under when he leans forward Has numbing pain in fingers and toes worsened by cold many years but no official diagnosis for it or workup On aspirin and statin already and gabapentin for idiopathic peripheral neuropathy          Objective:  Physical Exam  BP 137/87   Pulse (!) 56   Temp 98.1 F (36.7 C) (Oral)   Ht '5\' 10"'$  (1.778 m)   Wt 173 lb 9.6 oz (78.7 kg)   SpO2 99%   BMI 24.91 kg/m  Well developed, well nourished  by BMI criteria but truncal adiposity (waist circumference or caliper) should be used instead. Wt Readings from Last 10 Encounters:  05/08/22 173 lb 9.6 oz (78.7 kg)  04/18/22 177 lb (80.3 kg)  04/05/22 170 lb (77.1 kg)  03/06/22 178 lb 12.8 oz (81.1 kg)  10/26/21 168 lb (76.2 kg)  09/21/21 176 lb 2 oz (79.9 kg)  07/05/21 182 lb 6.4 oz (82.7 kg)  06/27/21 179 lb (81.2 kg)  06/01/21 178 lb 12.8 oz (81.1 kg)  01/10/21 174 lb 6.4 oz (79.1 kg)   Vital signs reviewed.  Nursing  notes reviewed. Weight trend reviewed. General Appearance:  Well developed, well nourished male in no acute distress.   Normal work of breathing at rest Musculoskeletal: All extremities are intact.  Neurological:  Awake, alert,  No obvious focal neurological deficits or cognitive impairments Psychiatric:  Appropriate mood, pleasant demeanor Problem-specific findings:  very pale swollen fingertip (especially left index and middle) that cannot get oxygen read over 70 throughout appointment.     Results Reviewed: Results for orders placed or performed in visit on 05/08/22  CBC  Result Value Ref Range   WBC 6.3 4.0 - 10.5 K/uL   RBC 4.85 4.22 - 5.81 Mil/uL   Platelets 171.0 150.0 - 400.0 K/uL   Hemoglobin 14.8 13.0 - 17.0 g/dL   HCT 43.7 39.0 - 52.0 %   MCV 90.1 78.0 - 100.0 fl   MCHC 34.0 30.0 - 36.0 g/dL   RDW 14.6 11.5 - 15.5 %    Recent Results (from the past 2160 hour(s))  Hemoglobin A1c     Status: None   Collection Time: 03/06/22  8:44 AM  Result Value Ref Range   Hgb A1c MFr Bld 5.9 4.6 - 6.5 %    Comment: Glycemic Control Guidelines for People with Diabetes:Non Diabetic:  <6%Goal of Therapy: <7%Additional Action Suggested:  >2%   Basic Metabolic Panel (BMET)     Status: Abnormal   Collection Time: 03/06/22  8:44 AM  Result Value Ref Range   Sodium 141 135 - 145 mEq/L   Potassium 4.7 3.5 - 5.1 mEq/L   Chloride 104 96 - 112 mEq/L   CO2 30 19 - 32 mEq/L   Glucose, Bld 94 70 - 99 mg/dL   BUN 25 (H) 6 - 23 mg/dL   Creatinine, Ser 1.32 0.40 - 1.50 mg/dL   GFR 51.22 (L) >60.00 mL/min    Comment: Calculated using the CKD-EPI Creatinine Equation (2021)   Calcium 8.9 8.4 - 10.5 mg/dL  Vitamin B12  Status: None   Collection Time: 03/06/22  8:44 AM  Result Value Ref Range   Vitamin B-12 582 211 - 911 pg/mL  Vitamin D (25 hydroxy)     Status: None   Collection Time: 03/06/22  8:44 AM  Result Value Ref Range   VITD 52.49 30.00 - 100.00 ng/mL  CBC with Differential/Platelets      Status: Abnormal   Collection Time: 03/06/22  8:44 AM  Result Value Ref Range   WBC 6.4 4.0 - 10.5 K/uL   RBC 4.79 4.22 - 5.81 Mil/uL   Hemoglobin 13.9 13.0 - 17.0 g/dL   HCT 42.6 39.0 - 52.0 %   MCV 88.9 78.0 - 100.0 fl   MCHC 32.6 30.0 - 36.0 g/dL   RDW 16.2 (H) 11.5 - 15.5 %   Platelets 177.0 150.0 - 400.0 K/uL   Neutrophils Relative % 57.8 43.0 - 77.0 %   Lymphocytes Relative 29.5 12.0 - 46.0 %   Monocytes Relative 9.4 3.0 - 12.0 %   Eosinophils Relative 2.9 0.0 - 5.0 %   Basophils Relative 0.4 0.0 - 3.0 %   Neutro Abs 3.7 1.4 - 7.7 K/uL   Lymphs Abs 1.9 0.7 - 4.0 K/uL   Monocytes Absolute 0.6 0.1 - 1.0 K/uL   Eosinophils Absolute 0.2 0.0 - 0.7 K/uL   Basophils Absolute 0.0 0.0 - 0.1 K/uL  IBC + Ferritin     Status: Abnormal   Collection Time: 03/06/22  8:44 AM  Result Value Ref Range   Iron 77 42 - 165 ug/dL   Transferrin 248.0 212.0 - 360.0 mg/dL   Saturation Ratios 22.2 20.0 - 50.0 %   Ferritin 19.4 (L) 22.0 - 322.0 ng/mL   TIBC 347.2 250.0 - 450.0 mcg/dL  Lipid panel     Status: Abnormal   Collection Time: 03/06/22  8:44 AM  Result Value Ref Range   Cholesterol 152 0 - 200 mg/dL    Comment: ATP III Classification       Desirable:  < 200 mg/dL               Borderline High:  200 - 239 mg/dL          High:  > = 240 mg/dL   Triglycerides 214.0 (H) 0.0 - 149.0 mg/dL    Comment: Normal:  <150 mg/dLBorderline High:  150 - 199 mg/dL   HDL 38.00 (L) >39.00 mg/dL   VLDL 42.8 (H) 0.0 - 40.0 mg/dL   Total CHOL/HDL Ratio 4     Comment:                Men          Women1/2 Average Risk     3.4          3.3Average Risk          5.0          4.42X Average Risk          9.6          7.13X Average Risk          15.0          11.0                       NonHDL 114.25     Comment: NOTE:  Non-HDL goal should be 30 mg/dL higher than patient's LDL goal (i.e. LDL goal of < 70 mg/dL, would have non-HDL goal of < 100 mg/dL)  LDL cholesterol, direct     Status: None   Collection Time:  03/06/22  8:44 AM  Result Value Ref Range   Direct LDL 84.0 mg/dL    Comment: Optimal:  <100 mg/dLNear or Above Optimal:  100-129 mg/dLBorderline High:  130-159 mg/dLHigh:  160-189 mg/dLVery High:  >190 mg/dL  PSA     Status: None   Collection Time: 04/05/22 11:04 AM  Result Value Ref Range   PSA 1.14 0.10 - 4.00 ng/mL    Comment: Test performed using Access [employer] PSA Assay, a parmagnetic partical, chemiluminecent immunoassay.  Fecal occult blood, imunochemical([facility])     Status: None   Collection Time: 04/17/22  8:32 AM   Specimen: Stool  Result Value Ref Range   Fecal Occult Bld Negative Negative  CBC     Status: None   Collection Time: 05/08/22  9:12 AM  Result Value Ref Range   WBC 6.3 4.0 - 10.5 K/uL   RBC 4.85 4.22 - 5.81 Mil/uL   Platelets 171.0 150.0 - 400.0 K/uL   Hemoglobin 14.8 13.0 - 17.0 g/dL   HCT 43.7 39.0 - 52.0 %   MCV 90.1 78.0 - 100.0 fl   MCHC 34.0 30.0 - 36.0 g/dL   RDW 14.6 11.5 - 15.5 %          Signed: Loralee Pacas, MD 05/08/2022 6:45 PM

## 2022-05-08 NOTE — Assessment & Plan Note (Signed)
Patients chart review and interview were used to generate a prompt for artificial intelligence analysis ([employer] artificial intelligence) clinical decision support.  AI output was reviewed and is provided in red:  #Intermittent Peripheral Ischemia  The patient is a 82 year old male with a history of intermittent gastrointestinal bleeding who now presents with symptoms suggestive of peripheral ischemia, as evidenced by cold, pale fingertips and toe tips that numb intermittently and worsen with cold exposure. The history of smoking, despite cessation 20 years ago, contributes to the risk of vascular disease. The low oxygen saturation readings in the affected fingers indicate compromised blood flow. The differential diagnosis includes peripheral arterial disease (PAD), Raynaud's phenomenon, vasculitis, and embolic phenomena.  Dx: - Complete blood count (CBC) to assess for anemia or polycythemia. - Comprehensive metabolic panel (CMP) to evaluate for electrolyte imbalances and renal function. - Coagulation studies including PT/INR and aPTT to rule out coagulopathies. - Ankle-brachial index (ABI) to assess for peripheral arterial disease. - Doppler ultrasound of the extremities to evaluate blood flow and identify any arterial occlusions. - Echocardiogram to rule out cardiac sources of emboli. - Autoantibody panel including ANA, ANCA, and rheumatoid factor to evaluate for autoimmune conditions such as vasculitis. - Cold stimulation test for Raynaud's phenomenon.  Tx: - Avoidance of cold temperatures and smoking cessation support if needed. - Calcium channel blockers such as nifedipine to reduce the frequency and severity of Raynaud's attacks if diagnosed. - Antiplatelet agents such as aspirin to reduce the risk of thrombotic events in PAD. - Supervised exercise program to improve peripheral circulation. - Referral to a vascular specialist for further evaluation and management, including possible  angioplasty or bypass surgery for PAD if indicated. - Management of underlying conditions such as autoimmune diseases or coagulopathies with appropriate medications if diagnosed.   See AVS for handout given on conservative mgmt

## 2022-05-08 NOTE — Assessment & Plan Note (Addendum)
Discussed pain and ultrasound findings negative - advised no further workup unless severe or worsening and he says its not and seem ok with not working on this further at this time.

## 2022-05-10 LAB — ANA: Anti Nuclear Antibody (ANA): POSITIVE — AB

## 2022-05-10 LAB — ANTI-NUCLEAR AB-TITER (ANA TITER)
ANA TITER: 1:80 {titer} — ABNORMAL HIGH
ANA Titer 1: 1:40 {titer} — ABNORMAL HIGH

## 2022-05-11 LAB — ANCA PROFILE
Anti-MPO Antibodies: <0.2 U (ref 0.0–0.9)
Anti-PR3 Antibodies: <0.2 U (ref 0.0–0.9)
Atypical pANCA: <1:20 {titer}
C-ANCA: <1:20 {titer}
P-ANCA: <1:20 {titer}

## 2022-05-11 NOTE — Progress Notes (Signed)
Positive Anti Nuclear Antigen (ANA) with the cold painful fingers and arthritis and Raynaud could be lupus but probably another autoimmune disease.   Recommmend seeing rheumatology specialist.  Will refer with permission - call and ask for.

## 2022-05-20 ENCOUNTER — Ambulatory Visit (HOSPITAL_BASED_OUTPATIENT_CLINIC_OR_DEPARTMENT_OTHER)
Admission: RE | Admit: 2022-05-20 | Discharge: 2022-05-20 | Disposition: A | Discharge status: Home / Self Care | Payer: HMO | Source: Ambulatory Visit | Attending: Internal Medicine | Admitting: Internal Medicine

## 2022-05-20 DIAGNOSIS — R1011 Right upper quadrant pain: Secondary | ICD-10-CM | POA: Insufficient documentation

## 2022-05-20 DIAGNOSIS — N200 Calculus of kidney: Secondary | ICD-10-CM | POA: Diagnosis not present

## 2022-05-20 LAB — POCT I-STAT CREATININE: Creatinine, Ser: 1.3 mg/dL — ABNORMAL HIGH (ref 0.61–1.24)

## 2022-05-20 MED ORDER — IOHEXOL 300 MG/ML  SOLN
100.0000 mL | Freq: Once | INTRAMUSCULAR | Status: AC | PRN
  Administered 2022-05-20: 80 mL via INTRAVENOUS

## 2022-05-21 ENCOUNTER — Encounter: Payer: Self-pay | Admitting: Internal Medicine

## 2022-05-21 DIAGNOSIS — K4090 Unilateral inguinal hernia, without obstruction or gangrene, not specified as recurrent: Secondary | ICD-10-CM | POA: Insufficient documentation

## 2022-05-21 DIAGNOSIS — J479 Bronchiectasis, uncomplicated: Secondary | ICD-10-CM | POA: Insufficient documentation

## 2022-05-21 DIAGNOSIS — M5136 Other intervertebral disc degeneration, lumbar region: Secondary | ICD-10-CM | POA: Insufficient documentation

## 2022-05-21 DIAGNOSIS — I700 Atherosclerosis of aorta: Secondary | ICD-10-CM | POA: Insufficient documentation

## 2022-05-21 DIAGNOSIS — N200 Calculus of kidney: Secondary | ICD-10-CM | POA: Insufficient documentation

## 2022-05-21 DIAGNOSIS — N4289 Other specified disorders of prostate: Secondary | ICD-10-CM | POA: Insufficient documentation

## 2022-05-21 DIAGNOSIS — K5790 Diverticulosis of intestine, part unspecified, without perforation or abscess without bleeding: Secondary | ICD-10-CM | POA: Insufficient documentation

## 2022-05-21 NOTE — Progress Notes (Signed)
Reviewed  unchanged from prior in my medical opinion

## 2022-05-21 NOTE — Progress Notes (Signed)
Strangely - the kidney cyst was not visible at all on this scan.   They did see a bunch of other issues: diverticular disease, right inguinal hernia, aortic atherosclerosis, emphysema,  kidney stones, bad back disk.    We can talk about it all at upcoming appointment there is a lot of ground to cover... nothing really explains the right upper abdominal pain though.

## 2022-05-24 ENCOUNTER — Ambulatory Visit
Admission: RE | Admit: 2022-05-24 | Discharge: 2022-05-24 | Disposition: A | Discharge status: Home / Self Care | Payer: HMO | Source: Ambulatory Visit | Attending: Internal Medicine | Admitting: Internal Medicine

## 2022-05-24 DIAGNOSIS — I739 Peripheral vascular disease, unspecified: Secondary | ICD-10-CM

## 2022-05-24 DIAGNOSIS — I7300 Raynaud's syndrome without gangrene: Secondary | ICD-10-CM | POA: Diagnosis not present

## 2022-05-24 DIAGNOSIS — I731 Thromboangiitis obliterans [Buerger's disease]: Secondary | ICD-10-CM

## 2022-05-24 DIAGNOSIS — R1011 Right upper quadrant pain: Secondary | ICD-10-CM

## 2022-05-24 DIAGNOSIS — N281 Cyst of kidney, acquired: Secondary | ICD-10-CM

## 2022-05-28 DIAGNOSIS — D225 Melanocytic nevi of trunk: Secondary | ICD-10-CM | POA: Diagnosis not present

## 2022-05-28 DIAGNOSIS — X32XXXD Exposure to sunlight, subsequent encounter: Secondary | ICD-10-CM | POA: Diagnosis not present

## 2022-05-28 DIAGNOSIS — L570 Actinic keratosis: Secondary | ICD-10-CM | POA: Diagnosis not present

## 2022-05-28 DIAGNOSIS — Z1283 Encounter for screening for malignant neoplasm of skin: Secondary | ICD-10-CM | POA: Diagnosis not present

## 2022-05-29 ENCOUNTER — Encounter: Payer: Self-pay | Admitting: Vascular Surgery

## 2022-05-29 ENCOUNTER — Ambulatory Visit (INDEPENDENT_AMBULATORY_CARE_PROVIDER_SITE_OTHER): Payer: HMO | Admitting: Vascular Surgery

## 2022-05-29 VITALS — BP 136/61 | HR 76 | Temp 98.3°F | Resp 16 | Ht 70.0 in | Wt 177.0 lb

## 2022-05-29 DIAGNOSIS — I7300 Raynaud's syndrome without gangrene: Secondary | ICD-10-CM | POA: Diagnosis not present

## 2022-05-29 DIAGNOSIS — D509 Iron deficiency anemia, unspecified: Secondary | ICD-10-CM | POA: Diagnosis not present

## 2022-05-29 DIAGNOSIS — R634 Abnormal weight loss: Secondary | ICD-10-CM | POA: Diagnosis not present

## 2022-05-29 NOTE — Progress Notes (Signed)
Patient name: HARISON OLESKY MRN: [id number] DOB: Aug 08, 1940 Sex: male  REASON FOR CONSULT: Evaluate for PAD  HPI: CORELL ORLOFF is a 82 y.o. male, with hx HLD and arthritis presents for evaluation of PAD.  Patient states he has had numbness in both lower extremities for years.  This is below the knees.  This does not get worse when he walks.  He recently saw a new PCP and was referred to our practice.  States has been told he has neuropathy in the past and has had multiple test for this.  Also describes some blanching of his fingertips that occur intermittently and resolves.  He did have lower extremity arterial duplex on 05/24/2022 with normal exam and no hemodynamically significant stenosis.  ABI's were 1.3 on the right and 1.3 on the left with triphasic waveforms at the ankle.  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)   Chest discomfort 04/05/2022   Associated with fatigue worsening 02/2022   Essential tremor    GERD (gastroesophageal reflux disease) 02/24/2018   High risk medication use 04/05/2022   Lyrica for neuropathy     History of melanoma 03/26/2018   S/p excision on back   Hypercholesteremia    Mixed hyperlipidemia 02/24/2018   Peripheral neuropathy    RUQ abdominal pain 05/08/2022   We did ultrasound abdomen 04/26/22 to evaluate pain he has where it feels like right upper quadrant of abdomen pain flared by leaning forward feels like liver tip rolling over but that was negative   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   well diff-right hand- txpbx   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 ([id number])   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   in situ-right parotid area   Unintentional weight loss 04/05/2022   Wt Readings from Last 20 Encounters: 04/05/22 170 lb (77.1 kg) 03/06/22 178 lb 12.8  oz (81.1 kg) 10/26/21 168 lb (76.2 kg) 09/21/21 176 lb 2 oz (79.9 kg) 07/05/21 182 lb 6.4 oz (82.7 kg) 06/27/21 179 lb (81.2 kg) 06/01/21 178 lb 12.8 oz (81.1 kg) 01/10/21 174 lb 6.4 oz (79.1 kg) 12/19/20 171 lb (77.6 kg) 08/29/20 178 lb 6.4 oz (80.9 kg) 06/15/20 184 lb 12.8 oz (83.8 kg) 06/02/20 183 lb    Past Surgical History:  Procedure Laterality Date   CATARACT EXTRACTION, BILATERAL     GALLBLADDER SURGERY     HERNIA REPAIR     bilateral   MELANOMA EXCISION      Family History  Problem Relation Age of Onset   Lung cancer Father    Bladder Cancer Brother    Heart disease Brother    Neuropathy Daughter     SOCIAL HISTORY: Social History   Socioeconomic History   Marital status: Married    Spouse name: Not on file   Number of children: y   Years of education: Not on file   Highest education level: Not on file  Occupational History   Occupation: retired    Comment: Nurse, mental health order; painting  Tobacco Use   Smoking status: Former    Packs/day: 1.00    Years: 50.00    Total pack years: 50.00    Types: Cigarettes    Quit date: 04/06/2008    Years since quitting: 14.1   Smokeless tobacco: Former    Quit  date: 05/16/2002  Vaping Use   Vaping Use: Never used  Substance and Sexual Activity   Alcohol use: No    Alcohol/week: 0.0 standard drinks of alcohol   Drug use: No   Sexual activity: Not on file  Other Topics Concern   Not on file  Social History Narrative   Right handed    Lives with wife   1 story   Social Determinants of Health   Financial Resource Strain: Not on file  Food Insecurity: Not on file  Transportation Needs: Not on file  Physical Activity: Not on file  Stress: Not on file  Social Connections: Not on file  Intimate Partner Violence: Not on file    Allergies  Allergen Reactions   Hydrocodone Other (See Comments)   Oxycodone Other (See Comments)   Oxycontin [Oxycodone Hcl] Hives    Current Outpatient Medications  Medication  Sig Dispense Refill   Acetaminophen (TYLENOL ARTHRITIS EXT RELIEF PO) Take 1 tablet by mouth every other day.     aspirin 81 MG tablet Take 81 mg by mouth every other day.      atorvastatin (LIPITOR) 40 MG tablet TAKE ONE TABLET BY MOUTH AT BEDTIME 90 tablet 3   celecoxib (CELEBREX) 200 MG capsule Take 1 capsule (200 mg total) by mouth 2 (two) times daily. 180 capsule 0   Cholecalciferol (VITAMIN D-3) 125 MCG (5000 UT) TABS Take 1 tablet by mouth daily.     cilostazol (PLETAL) 50 MG tablet Take 1 tablet (50 mg total) by mouth 2 (two) times daily. 30 tablet 2   Clobetasol Propionate 0.05 % shampoo      Cyanocobalamin (B-12 PO) Take 1 tablet by mouth daily.     diclofenac Sodium (VOLTAREN) 1 % GEL Apply 2 g topically in the morning and at bedtime. Every morning and every night.     fluticasone (CUTIVATE) [id number] % cream 1 application twice a day by topical route.     fluticasone (FLONASE) 50 MCG/ACT nasal spray Place 1 spray into both nostrils daily. 16 g 6   ipratropium (ATROVENT) 0.06 % nasal spray Place 1-2 sprays into both nostrils 4 (four) times daily. As needed for nasal congestion 15 mL 5   ketoconazole (NIZORAL) 2 % shampoo Apply to affected area- let sit 3-5 minutes before rinsing 120 mL 5   NIFEdipine (PROCARDIA-XL/NIFEDICAL-XL) 30 MG 24 hr tablet Take 1 tablet (30 mg total) by mouth daily. 30 tablet 2   NON FORMULARY CPAP     pantoprazole (PROTONIX) 40 MG tablet Take 1 tablet (40 mg total) by mouth daily. 90 tablet 1   pregabalin (LYRICA) 200 MG capsule Take 1 capsule (200 mg total) by mouth 2 (two) times daily. 180 capsule 1   primidone (MYSOLINE) 50 MG tablet TAKE ONE TABLET BY MOUTH EVERY MORNING AND ONE TABLET EVERY EVENING 180 tablet 3   Zoster Vaccine Adjuvanted [facility]) injection      No current facility-administered medications for this visit.    REVIEW OF SYSTEMS:  [X]$  denotes positive finding, [ ]$  denotes negative finding Cardiac  Comments:  Chest pain or chest pressure:     Shortness of breath upon exertion:    Short of breath when lying flat:    Irregular heart rhythm:        Vascular    Pain in calf, thigh, or hip brought on by ambulation:    Pain in feet at night that wakes you up from your sleep:     Blood clot  in your veins:    Leg swelling:         Pulmonary    Oxygen at home:    Productive cough:     Wheezing:         Neurologic    Sudden weakness in arms or legs:     Sudden numbness in arms or legs:     Sudden onset of difficulty speaking or slurred speech:    Temporary loss of vision in one eye:     Problems with dizziness:     Numbness both feet x   Gastrointestinal    Blood in stool:     Vomited blood:         Genitourinary    Burning when urinating:     Blood in urine:        Psychiatric    Major depression:         Hematologic    Bleeding problems:    Problems with blood clotting too easily:        Skin    Rashes or ulcers:        Constitutional    Fever or chills:      PHYSICAL EXAM: Vitals:   05/29/22 0907  BP: 136/61  Pulse: 76  Resp: 16  Temp: 98.3 F (36.8 C)  TempSrc: Temporal  SpO2: 96%  Weight: 177 lb (80.3 kg)  Height: 5' 10"$  (1.778 m)    GENERAL: The patient is a well-nourished male, in no acute distress. The vital signs are documented above. CARDIAC: There is a regular rate and rhythm.  VASCULAR:  Bilateral femoral pulses palpable  Bilateral AT pulses palpable Bilateral PT pulses palpable PULMONARY: No respiratory distress. ABDOMEN: Soft and non-tender. MUSCULOSKELETAL: There are no major deformities or cyanosis. NEUROLOGIC: No focal weakness or paresthesias are detected. SKIN: There are no ulcers or rashes noted. PSYCHIATRIC: The patient has a normal affect.  DATA:   Narrative & Impression  CLINICAL DATA:  Peripheral arterial disease, Buerger's disease, Raynaud's disease   EXAM: BILATERAL LOWER EXTREMITY ARTERIAL DUPLEX SCAN   TECHNIQUE: Gray-scale sonography as well as color  Doppler and duplex ultrasound was performed to evaluate the arteries of both lower extremities including the common, superficial and profunda femoral arteries, popliteal artery and calf arteries.   COMPARISON:  None Available.   FINDINGS: Right Lower Extremity   ABI: 1.3   Inflow: Normal common femoral arterial waveforms and velocities. No evidence of inflow (aortoiliac) disease.   Outflow: Normal profunda femoral, superficial femoral and popliteal arterial waveforms and velocities. No focal elevation of the PSV to suggest stenosis.   Runoff: Normal posterior and anterior tibial arterial waveforms and velocities. Vessels are patent to the ankle.   Left Lower Extremity   ABI: 1.3   Inflow: Normal common femoral arterial waveforms and velocities. No evidence of inflow (aortoiliac) disease.   Outflow: Normal profunda femoral, superficial femoral and popliteal arterial waveforms and velocities. No focal elevation of the PSV to suggest stenosis.   Runoff: Normal posterior and anterior tibial arterial waveforms and velocities. Vessels are patent to the ankle.   IMPRESSION: Normal exam no evidence of hemodynamically significant peripheral arterial disease.   Signed,   Criselda Peaches, MD, [place]   [facility]   [facility]     Electronically Signed   By: Jacqulynn Cadet M.D.   On: 05/25/2022 05:50    Assessment/Plan:  82 year old male presents for evaluation of PAD.  I discussed that his noninvasive  imaging shows normal triphasic waveforms at the ankle with no hemodynamically significant stenosis in his lower extremities.  On exam he has palpable PT and AT pulses bilaterally.  I do not think he has any evidence of arterial insufficiency.  He has previously been diagnosed with neuropathy according to the patient.  He does not need any intervention from my standpoint.  He can follow-up as needed.  His upper extremity  blanching sounds like Raynaud's.  He has no tissue loss.  He has easily palpable radial pulses that are bounding at the wrist.   Marty Heck, MD [facility]: [phone]

## 2022-06-01 NOTE — Progress Notes (Unsigned)
Subjective:    Patient ID: Stephen Anderson, male    DOB: 1940/11/18, 82 y.o.   MRN: [id number]  HPI male former smoker  followed for OSA complicated by peripheral neuropathy, essential tremor NPSG 11/2013  AHI 21/ hr, titrated to 8 cwp  -----------------------------------------------------------  06/01/21-  82 yo male former smoker followed for OSA, Insomnia,  complicated by peripheral neuropathy, essential tremor, GERD, BPH,  CPAP auto 5-15/Adapt -Caffeine 200 mg tab, primidone, gabapentin, doxylamine for sleep     Download- compliance 97%, AHI 8.9/ hr     pressure range 5-8 Body weight today-178 lbs Covid vax-3 Phizer Flu vax-no Download reviewed.  Residual AHI is a little higher than ideal but it I do not see that CPAP adjustment would change this.  There are a few central apneas.  He is very comfortable with his CPAP machine which is mechanically working quite well. Breathing is comfortable and he has avoided COVID infection. His most active concern is peripheral neuropathy which is being worked on.  He mentions history of having had "melanomas" removed from back and temples without active disease now-I see reference to squamous cell carcinomas  06/04/22- 82 yo male former smoker followed for OSA, Insomnia,  complicated by peripheral neuropathy, essential tremor, GERD, BPH,  CPAP auto 5-15/Adapt -Caffeine 200 mg tab, primidone,Lyrica  Download- compliance 100%, AHI 8.6/ hr Body weight today-178 lbs Covid vax-3 Phizer Flu vax-no Download reviewed.  He is doing very well, using CPAP every night.  Machine currently pressure ranging between 6.4 and 10.5.  He is having a few breakthrough events per hour likely reflecting mask leak, but he is comfortable.  He is trying to make masks last longer because of cost. His primary complaints continue related to peripheral neuropathy and arthritis for which primary physician is making adjustments.  [facility] chest  05/05/22 1. No acute findings in the chest. Specifically, no findings to explain the patient's history of weight loss. 2. Mild cylindrical bronchiectasis in all lobes of both lungs. 3. 2 mm nonobstructing left renal stone. 4. 1.8 cm lesion interpolar right kidney is new since 2017, likely a cyst. CT abdomen with contrast material recommended to exclude caliceal lesion. 5. Emphysema (ICD10-J43.9) and Aortic Atherosclerosis (ICD10-170.0) .  ROS-see HPI    "+" = positive Constitutional:    weight loss, night sweats, fevers, chills, fatigue, lassitude. HEENT:    headaches, difficulty swallowing, tooth/dental problems, sore throat,       sneezing, itching, ear ache, nasal congestion, post nasal drip, snoring CV:    chest pain, orthopnea, PND, swelling in lower extremities, anasarca,                                                 dizziness, palpitations Resp:   shortness of breath with exertion or at rest.                productive cough,   non-productive cough, coughing up of blood.              change in color of mucus.  wheezing.   Skin:    rash or lesions. GI:  No-   heartburn, indigestion, abdominal pain, nausea, vomiting, diarrhea,                 change in bowel habits, loss  of appetite GU: dysuria, change in color of urine, no urgency or frequency.   flank pain. MS:   +joint pain, stiffness, decreased range of motion, back pain. Neuro-    + symptoms of peripheral neuropathy both lower legs. Psych:  change in mood or affect.  depression or anxiety.   memory loss.    Objective:  OBJ- Physical Exam   General- Alert, Oriented, Affect-appropriate, Distress- none acute, fit-appearing/ not obese Skin- rash-none, lesions- none, excoriation- none Lymphadenopathy- none Head- atraumatic            Eyes- Gross vision intact, PERRLA, conjunctivae and secretions clear            Ears- +Hard of hearing            Nose- Clear, no-Septal dev, mucus, polyps, erosion, perforation              Throat- Mallampati II , mucosa clear , drainage- none, tonsils- atrophic Neck- flexible , trachea midline, no stridor , thyroid nl, carotid no bruit Chest - symmetrical excursion , unlabored           Heart/CV- RRR , no murmur , no gallop  , no rub, nl s1 s2                           - JVD- none , edema- none, stasis changes- none, varices- none           Lung- clear to P&A, wheeze- none, cough- none , dullness-none, rub- none           Chest wall-  Abd-  Br/ Gen/ Rectal- Not done, not indicated Extrem- cyanosis- none, clubbing, none, atrophy- none, strength- nl Neuro- + tremor mouth  Assessment & Plan:

## 2022-06-04 ENCOUNTER — Ambulatory Visit (INDEPENDENT_AMBULATORY_CARE_PROVIDER_SITE_OTHER): Payer: HMO

## 2022-06-04 ENCOUNTER — Encounter: Payer: Self-pay | Admitting: Internal Medicine

## 2022-06-04 ENCOUNTER — Ambulatory Visit (INDEPENDENT_AMBULATORY_CARE_PROVIDER_SITE_OTHER): Payer: HMO | Admitting: Internal Medicine

## 2022-06-04 VITALS — BP 122/70 | HR 72 | Ht 70.0 in | Wt 178.0 lb

## 2022-06-04 DIAGNOSIS — I731 Thromboangiitis obliterans [Buerger's disease]: Secondary | ICD-10-CM

## 2022-06-04 DIAGNOSIS — G4733 Obstructive sleep apnea (adult) (pediatric): Secondary | ICD-10-CM | POA: Diagnosis not present

## 2022-06-04 DIAGNOSIS — I7300 Raynaud's syndrome without gangrene: Secondary | ICD-10-CM

## 2022-06-04 DIAGNOSIS — I7389 Other specified peripheral vascular diseases: Secondary | ICD-10-CM | POA: Diagnosis not present

## 2022-06-04 DIAGNOSIS — J479 Bronchiectasis, uncomplicated: Secondary | ICD-10-CM

## 2022-06-04 DIAGNOSIS — R1011 Right upper quadrant pain: Secondary | ICD-10-CM | POA: Diagnosis not present

## 2022-06-04 DIAGNOSIS — I739 Peripheral vascular disease, unspecified: Secondary | ICD-10-CM

## 2022-06-04 LAB — ECHOCARDIOGRAM COMPLETE
Area-P 1/2: 3.77 cm2
Height: 70 in
MV M vel: 3.23 m/s
MV Peak grad: 41.6 mmHg
S' Lateral: 2.15 cm
Weight: 2848 oz

## 2022-06-04 NOTE — Assessment & Plan Note (Signed)
Benefits from CPAP and very compliant.  Residual AHI 8.6/hour likely reflects mask leak.  We discussed timing for replacement. Plan-continue auto 5-15

## 2022-06-04 NOTE — Assessment & Plan Note (Signed)
He is not noting cough or dyspnea now.  This can be watched.

## 2022-06-04 NOTE — Patient Instructions (Signed)
We can continue CPAP auto 5-15 ° °Please call if we can help °

## 2022-06-05 ENCOUNTER — Encounter: Payer: Self-pay | Admitting: Internal Medicine

## 2022-06-05 DIAGNOSIS — I517 Cardiomegaly: Secondary | ICD-10-CM | POA: Insufficient documentation

## 2022-06-05 DIAGNOSIS — I5189 Other ill-defined heart diseases: Secondary | ICD-10-CM | POA: Insufficient documentation

## 2022-06-07 ENCOUNTER — Encounter: Payer: Self-pay | Admitting: Internal Medicine

## 2022-06-07 ENCOUNTER — Ambulatory Visit (INDEPENDENT_AMBULATORY_CARE_PROVIDER_SITE_OTHER): Payer: HMO | Admitting: Internal Medicine

## 2022-06-07 VITALS — BP 106/68 | HR 77 | Temp 98.0°F | Ht 70.0 in | Wt 178.6 lb

## 2022-06-07 DIAGNOSIS — N4289 Other specified disorders of prostate: Secondary | ICD-10-CM

## 2022-06-07 DIAGNOSIS — G4733 Obstructive sleep apnea (adult) (pediatric): Secondary | ICD-10-CM | POA: Diagnosis not present

## 2022-06-07 DIAGNOSIS — D509 Iron deficiency anemia, unspecified: Secondary | ICD-10-CM | POA: Diagnosis not present

## 2022-06-07 DIAGNOSIS — N200 Calculus of kidney: Secondary | ICD-10-CM | POA: Diagnosis not present

## 2022-06-07 DIAGNOSIS — I731 Thromboangiitis obliterans [Buerger's disease]: Secondary | ICD-10-CM

## 2022-06-07 DIAGNOSIS — R634 Abnormal weight loss: Secondary | ICD-10-CM | POA: Diagnosis not present

## 2022-06-07 DIAGNOSIS — G609 Hereditary and idiopathic neuropathy, unspecified: Secondary | ICD-10-CM | POA: Diagnosis not present

## 2022-06-07 DIAGNOSIS — I700 Atherosclerosis of aorta: Secondary | ICD-10-CM | POA: Diagnosis not present

## 2022-06-07 DIAGNOSIS — M5136 Other intervertebral disc degeneration, lumbar region: Secondary | ICD-10-CM | POA: Diagnosis not present

## 2022-06-07 HISTORY — DX: Iron deficiency anemia, unspecified: D50.9

## 2022-06-07 MED ORDER — CILOSTAZOL 50 MG PO TABS: 50.0000 mg | ORAL_TABLET | Freq: Two times a day (BID) | ORAL | 3 refills | Status: DC

## 2022-06-07 MED ORDER — PREGABALIN 200 MG PO CAPS: 200.0000 mg | ORAL_CAPSULE | Freq: Two times a day (BID) | ORAL | 1 refills | Status: DC

## 2022-06-07 NOTE — Progress Notes (Signed)
[facility]: [id number]   Routine Medical Office Visit  Patient:  Stephen Anderson      Age: 82 y.o.       Sex:  male  Date:   06/07/2022  PCP:    Loralee Pacas, [place] Provider: Loralee Pacas, MD   Problem Focused Charting:   Medical Decision Making per Assessment/Plan   Main purpose for 1 month return to clinic was concern for unintentional weight loss- which seems to not be issue.  We also reviewed extensive recent labs and imaging. Cleaned up his problem list. Address concerns he had about Voltaren and dry mouth with CPAP.  Malicah was seen today for 1 month follow-up.  DDD (degenerative disc disease), lumbar Overview: CT abdomen 05/2022 noted severe disc space narrowing at L1-L2 with endplate changes and Schmorl nodes at this level.  Voltaren cream helps  Assessment & Plan: Patient concerned about Voltaren help but he wakes up stiff Reassured that he Voltaren is worth risk of stiffness. Offered physical therapy but he declined- he will just do [employer].  Encouraged him to ask for our team of nurse practitioners that work at the [facility] to help him work on this  Orders: -     Ambulatory referral to Rheumatology  OSA on CPAP Overview: Managed by pulm, Dr. Annamaria Boots NPSG 11/2013  AHI 21/ hr, titrated to 8 cwp Dry mouth despite using moisturizing  Assessment & Plan: Due to patient complains of dry mouth due to this I recommended that he switch to a nasal prong facemask and in particular I recommend the ResMed P10 prongs of the nose instead of squishing down on the nose like a full facemask and that keeps the nose airways open so that he will not mouth breathe and dry out his mouth.  In addition in order for this to work it is important to keep the sinuses clear of congestion and so I recommended recommend that he also do a nasal saline mist spray into each nose hold nostril each night with the  simply saline misting product.  If he is having an allergy flare he can do Flonase afterwards because it works more effectively after a nasal rinse.  I believe that with this combination of interventions he will be able to use the CPAP through the nose fully and be able to wear a more comfortable mask throughout the night.  Also I recommend nasal dilators such as Breathe Right or intake   Idiopathic peripheral neuropathy Overview: Evaluated by Dr. Carles Collet 2019. Gabapentin and lidocaine patches recommended From toes to knees bilaterally [name] switched gabapentin to Lyrica He uses lidocaine. These help, and it helped more when I upped the dose He has bad back disks and buergers as contributors- negative vascular evaluation 05/2022. History B12 deficiency on replacement but it has not resolved. Associated with restless legs syndrome    Assessment & Plan: Reviewed records, updated overview I am determined to follow up on this issues and determine the cause and improve the treatment  Orders: -     Pregabalin; Take 1 capsule (200 mg total) by mouth 2 (two) times daily.  Dispense: 180 capsule; Refill: 1  Thromboangiitis obliterans (Buerger's disease) ([place]) Overview: Used to smoke, cold weather ice over fingertips especially index and middle on left hand.   Orders: -     Cilostazol; Take 1 tablet (50 mg total) by mouth 2 (two) times daily.  Dispense:  90 tablet; Refill: 3  Calcification of prostate Overview: Lab Results  Component Value Date   PSA 1.14 04/05/2022   PSA 1.14 05/24/2020   PSA 0.97 05/21/2019       Weight loss Overview: Wt Readings from Last 10 Encounters:  06/07/22 178 lb 9.6 oz (81 kg)  06/04/22 178 lb (80.7 kg)  05/29/22 177 lb (80.3 kg)  05/08/22 173 lb 9.6 oz (78.7 kg)  04/18/22 177 lb (80.3 kg)  04/05/22 170 lb (77.1 kg)  03/06/22 178 lb 12.8 oz (81.1 kg)  10/26/21 168 lb (76.2 kg)  09/21/21 176 lb 2 oz (79.9 kg)  07/05/21 182 lb 6.4 oz (82.7 kg)      Assessment & Plan: Reviewed weights and they seem stable now.   Recurrent kidney stones Overview: History passing 2 stones, likes to drink milk every am 3. 2 mm nonobstructing left renal stone 04/2022 CT chest  Assessment & Plan: Recommended increase water intake We should check vitamin D next lab draw.   Iron deficiency anemia, unspecified iron deficiency anemia type Overview: Lab Results  Component Value Date/Time   HGB 14.8 05/08/2022 09:12 AM   HGB 13.9 03/06/2022 08:44 AM   HGB 11.9 (L) 10/26/2021 08:37 AM   HGB 13.1 06/27/2021 08:58 AM   HGB 14.8 05/24/2020 08:17 AM           Subjective - Clinical Presentation:   DEDAN Anderson is a 82 y.o. male  Patient Active Problem List   Diagnosis Date Noted   Weight loss 06/07/2022   LVH (left ventricular hypertrophy) 06/05/2022   Grade I diastolic dysfunction [id number]   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   Right inguinal hernia 05/21/2022   Aortic atherosclerosis ([place]) 05/21/2022   Bronchiectasis ([place]) 05/21/2022   Recurrent kidney stones 05/21/2022   Calcification of prostate 05/21/2022   DDD (degenerative disc disease), lumbar 05/21/2022   Thromboangiitis obliterans (Buerger's disease) ([place]) 05/08/2022   RUQ abdominal pain 05/08/2022   Raynaud's disease without gangrene 05/08/2022   Kidney cyst, acquired 04/29/2022   Fatty liver 04/29/2022   Chest discomfort 04/05/2022   Former smoker 04/05/2022   High risk medication use 04/05/2022   Chronic fatigue 10/26/2021   RLS (restless legs syndrome) 09/21/2021   Right knee pain 09/15/2020   Pain in joint of right shoulder 09/15/2020   B12 deficiency 05/25/2019   Insomnia 04/23/2018   History of melanoma 03/26/2018   GERD (gastroesophageal reflux disease) 02/24/2018   Mixed hyperlipidemia 02/24/2018   Osteoarthritis, multiple sites 02/24/2018   OSA on CPAP 12/30/2013   Essential tremor 05/16/2012   Idiopathic peripheral neuropathy 05/16/2012   Past  Medical History:  Diagnosis Date   Atypical mole 02/24/2014   severe-upper left paraspinal (WS)   Atypical mole 03/25/2014   persistently dysplastic nevi- upper left paraspinal (EXC)   Chest discomfort 04/05/2022   Associated with fatigue worsening 02/2022   Essential tremor    GERD (gastroesophageal reflux disease) 02/24/2018   High risk medication use 04/05/2022   Lyrica for neuropathy     History of melanoma 03/26/2018   S/p excision on back   Hypercholesteremia    Iron deficiency anemia 06/07/2022   Lab Results  Component  Value  Date/Time     HGB  14.8  05/08/2022 09:12 AM     HGB  13.9  03/06/2022 08:44 AM     HGB  11.9 (L)  10/26/2021 08:37 AM     HGB  13.1  06/27/2021 08:58 AM     HGB  14.8  05/24/2020 08:17 AM         Mixed hyperlipidemia 02/24/2018   Peripheral neuropathy    RUQ abdominal pain 05/08/2022   We did ultrasound abdomen 04/26/22 to evaluate pain he has where it feels like right upper quadrant of abdomen pain flared by leaning forward feels like liver tip rolling over but that was negative   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   well diff-right hand- txpbx   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 ([id number])   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   in situ-right parotid area   Unintentional weight loss 04/05/2022   Wt Readings from Last 20 Encounters: 04/05/22 170 lb (77.1 kg) 03/06/22 178 lb 12.8 oz (81.1 kg) 10/26/21 168 lb (76.2 kg) 09/21/21 176 lb 2 oz (79.9 kg) 07/05/21 182 lb 6.4 oz (82.7 kg) 06/27/21 179 lb (81.2 kg) 06/01/21 178 lb 12.8 oz (81.1 kg) 01/10/21 174 lb 6.4 oz (79.1 kg) 12/19/20 171 lb (77.6 kg) 08/29/20 178 lb 6.4 oz (80.9 kg) 06/15/20 184 lb 12.8 oz (83.8 kg) 06/02/20 183 lb    Outpatient Medications Prior to Visit  Medication Sig   Acetaminophen (TYLENOL ARTHRITIS EXT RELIEF PO) Take 1 tablet by mouth every other day.   aspirin 81 MG tablet Take 81 mg by mouth every other day.    atorvastatin  (LIPITOR) 40 MG tablet TAKE ONE TABLET BY MOUTH AT BEDTIME   celecoxib (CELEBREX) 200 MG capsule Take 1 capsule (200 mg total) by mouth 2 (two) times daily.   Cholecalciferol (VITAMIN D-3) 125 MCG (5000 UT) TABS Take 1 tablet by mouth daily.   Clobetasol Propionate 0.05 % shampoo    Cyanocobalamin (B-12 PO) Take 1 tablet by mouth daily.   diclofenac Sodium (VOLTAREN) 1 % GEL Apply 2 g topically in the morning and at bedtime. Every morning and every night.   fluticasone (CUTIVATE) [id number] % cream 1 application twice a day by topical route.   fluticasone (FLONASE) 50 MCG/ACT nasal spray Place 1 spray into both nostrils daily.   ipratropium (ATROVENT) 0.06 % nasal spray Place 1-2 sprays into both nostrils 4 (four) times daily. As needed for nasal congestion   ketoconazole (NIZORAL) 2 % shampoo Apply to affected area- let sit 3-5 minutes before rinsing   NIFEdipine (PROCARDIA-XL/NIFEDICAL-XL) 30 MG 24 hr tablet Take 1 tablet (30 mg total) by mouth daily.   NON FORMULARY CPAP   pantoprazole (PROTONIX) 40 MG tablet Take 1 tablet (40 mg total) by mouth daily.   primidone (MYSOLINE) 50 MG tablet TAKE ONE TABLET BY MOUTH EVERY MORNING AND ONE TABLET EVERY EVENING   [DISCONTINUED] ASPIRIN 81 PO every other day.   [DISCONTINUED] cilostazol (PLETAL) 50 MG tablet Take 1 tablet (50 mg total) by mouth 2 (two) times daily.   [DISCONTINUED] diclofenac Sodium (VOLTAREN) 1 % GEL apply sparingly to affected areas Transdermal up to twice a day for 90 days   [DISCONTINUED] pregabalin (LYRICA) 200 MG capsule Take 1 capsule (200 mg total) by mouth 2 (two) times daily.   No facility-administered medications prior to visit.    Chief Complaint  Patient presents with   1 month follow-up    Wants to know if he should continue taking the cilostazol. Also, if the voltaren gel affects or dries out the joints of his knee.      HPI  See problem list over view updates and assessment / plan for interview information from today  Objective:  Physical Exam  BP 106/68 (BP Location: Left Arm, Patient Position: Sitting)   Pulse 77   Temp 98 F (36.7 C) (Temporal)   Ht '5\' 10"'$  (1.778 m)   Wt 178 lb 9.6 oz (81 kg)   SpO2 100%   BMI 25.63 kg/m   Overweight  by BMI criteria but truncal adiposity (waist circumference or caliper) should be used instead. Wt Readings from Last 10 Encounters:  06/07/22 178 lb 9.6 oz (81 kg)  06/04/22 178 lb (80.7 kg)  05/29/22 177 lb (80.3 kg)  05/08/22 173 lb 9.6 oz (78.7 kg)  04/18/22 177 lb (80.3 kg)  04/05/22 170 lb (77.1 kg)  03/06/22 178 lb 12.8 oz (81.1 kg)  10/26/21 168 lb (76.2 kg)  09/21/21 176 lb 2 oz (79.9 kg)  07/05/21 182 lb 6.4 oz (82.7 kg)   Vital signs reviewed.  Nursing notes reviewed. Weight trend reviewed. General Appearance:  Well developed, well nourished male in no acute distress.   Normal work of breathing at rest Musculoskeletal: All extremities are intact.  Neurological:  Awake, alert,  No obvious focal neurological deficits or cognitive impairments Psychiatric:  Appropriate mood, pleasant demeanor Problem-specific findings:  pale fingertips. Slow sit to stand. Very friendly gentleman.   Results Reviewed: No results found for any visits on 06/07/22.  Recent Results (from the past 2160 hour(s))  PSA     Status: None   Collection Time: 04/05/22 11:04 AM  Result Value Ref Range   PSA 1.14 0.10 - 4.00 ng/mL    Comment: Test performed using Access Hybritech PSA Assay, a parmagnetic partical, chemiluminecent immunoassay.  Fecal occult blood, imunochemical([facility])     Status: None   Collection Time: 04/17/22  8:32 AM   Specimen: Stool  Result Value Ref Range   Fecal Occult Bld Negative Negative  CBC     Status: None   Collection Time: 05/08/22  9:12 AM  Result Value Ref Range   WBC 6.3 4.0 - 10.5 K/uL   RBC 4.85 4.22 - 5.81 Mil/uL   Platelets 171.0 150.0 - 400.0 K/uL   Hemoglobin 14.8 13.0 - 17.0 g/dL   HCT 43.7 39.0 - 52.0 %    MCV 90.1 78.0 - 100.0 fl   MCHC 34.0 30.0 - 36.0 g/dL   RDW 14.6 11.5 - 15.5 %  ANA     Status: Abnormal   Collection Time: 05/08/22  9:12 AM  Result Value Ref Range   Anti Nuclear Antibody (ANA) POSITIVE (A) NEGATIVE    Comment: ANA IFA is a first line screen for detecting the presence of up to approximately 150 autoantibodies in various autoimmune diseases. A positive ANA IFA result is suggestive of autoimmune disease and reflexes to titer and pattern. Further laboratory testing may be considered if clinically indicated. . For additional information, please refer to http://education.[facility].com/faq/FAQ177 (This link is being provided for informational/ educational purposes only.) .   ANCA Profile     Status: None   Collection Time: 05/08/22  9:12 AM  Result Value Ref Range   Anti-MPO Antibodies <0.2 0.0 - 0.9 units   Anti-PR3 Antibodies <0.2 0.0 - 0.9 units   C-ANCA <1:20 Neg:<1:20 titer   P-ANCA <1:20 Neg:<1:20 titer    Comment: The presence of positive fluorescence exhibiting P-ANCA or C-ANCA patterns alone is not specific for the diagnosis of Wegener's Granulomatosis (WG) or microscopic polyangiitis. Decisions about treatment should not be based solely on ANCA IFA results.  The International ANCA Group Consensus recommends follow up  testing of positive sera with both PR-3 and MPO-ANCA enzyme immunoassays. As many as 5% serum samples are positive only by EIA. Ref. AM J Clin Pathol 1999;111:507-513.    Atypical pANCA <1:20 Neg:<1:20 titer    Comment: The atypical pANCA pattern has been observed in a significant percentage of patients with ulcerative colitis, primary sclerosing cholangitis and autoimmune hepatitis.   Anti-nuclear ab-titer (ANA titer)     Status: Abnormal   Collection Time: 05/08/22  9:12 AM  Result Value Ref Range   ANA Titer 1 1:40 (H) titer    Comment: A low level ANA titer may be present in pre-clinical autoimmune diseases and normal  individuals.                 Reference Range                 <1:40        Negative                 1:40-1:80    Low Antibody Level                 >1:80        Elevated Antibody Level .    ANA Pattern 1 Cytoplasmic (A)     Comment: The presence of cytoplasmic fluorescence was noted on the HEp-2 slide. Other reactivities (e.g., anti- mitochondrial antibodies or anti-smooth muscle antibodies) may be responsible for this fluorescence. The clinical significance of this finding is uncertain. Clinical correlation is recommended. . AC-15 to AC-23: Cytoplasmic . International Consensus on ANA Patterns (https://www.hernandez-[email or web address]/)    ANA TITER 1:80 (H) titer    Comment: A low level ANA titer may be present in pre-clinical autoimmune diseases and normal individuals.                 Reference Range                 <1:40        Negative                 1:40-1:80    Low Antibody Level                 >1:80        Elevated Antibody Level .    ANA PATTERN Nuclear, Homogeneous (A)     Comment: Homogeneous pattern is associated with systemic lupus erythematosus (SLE), drug-induced lupus and juvenile idiopathic arthritis. . AC-1: Homogeneous . International Consensus on ANA Patterns (https://www.hernandez-[email or web address]/)   I-STAT creatinine     Status: Abnormal   Collection Time: 05/20/22  9:20 AM  Result Value Ref Range   Creatinine, Ser 1.30 (H) 0.61 - 1.24 mg/dL  ECHOCARDIOGRAM COMPLETE     Status: None   Collection Time: 06/04/22 11:47 AM  Result Value Ref Range   Weight 2,848 oz   Height 70 in   BP 122/70 mmHg   S' Lateral 2.15 cm   Area-P 1/2 3.77 cm2   MV M vel 3.23 m/s   MV Peak grad 41.6 mmHg   Est EF 55 - 60%           Signed: Loralee Pacas, MD 06/07/2022 8:57 AM

## 2022-06-07 NOTE — Assessment & Plan Note (Signed)
Recommended increase water intake We should check vitamin D next lab draw.

## 2022-06-07 NOTE — Patient Instructions (Addendum)
I think most likely a big prostate caused the kidney cysts and that they can be safely ignored I think the Voltaren is safe to keep using. Switched your cilostazol to 90-day fills and I want you to stay on it to try to protect your fingertips from the turning pale it should give a little more blood to them but at the price of your blood is going to be a little thinner.  The rheumatologist referral was placed and they are going to look into whether this might be an autoimmune condition that is causing then issue.  The vascular evaluation was pretty negative and the kidney ultrasound results are nothing to worry about.  I do think that the reason that you have the kidney cysts is something called benign prostate hyperplasia which just means big prostate which pretty much all men get at your age and so I gave a handout about it Was read the information of below below about our plan to fix your dry mouth for the CPAP and return to see me in 3 months  It was a pleasure seeing you today!  Your health and satisfaction are my top priorities. If you believe your experience today was worthy of a 5-star rating, I'd be grateful for your feedback! Loralee Pacas, MD   CHECKOUT CHECKLIST  '[]'$    Schedule next appointment(s):    Return in about 3 months (around 09/05/2022) for chronic disease monitoring and management.  Any requested lab visits should be scheduled as appointments too  If you are not doing well:  Return to the office sooner Please bring all your medicine bottles to each appointment If your condition begins to worsen or become severe:  go to the emergency room or even call 911   '[]'$   (Optional):  Review your clinical notes on MyChart after they are completed.     Today's draft of the physician documented plan for today's visit: (final revisions will be visible on MyChart chart later) DDD (degenerative disc disease), lumbar Assessment & Plan: Patient concerned about Voltaren help but he wakes up  stiff Reassured that he Voltaren is worth risk of stiffness. Offered physical therapy but he declined- he will just do [employer].  Encouraged him to ask for our team of nurse practitioners that work at the [facility] to help him work on this  Orders: -     Ambulatory referral to Rheumatology  OSA on CPAP Assessment & Plan: Due to patient complains of dry mouth due to this I recommended that he switch to a nasal prong facemask and in particular I recommend the ResMed P10 prongs of the nose instead of squishing down on the nose like a full facemask and that keeps the nose airways open so that he will not mouth breathe and dry out his mouth.  In addition in order for this to work it is important to keep the sinuses clear of congestion and so I recommended recommend that he also do a nasal saline mist spray into each nose hold nostril each night with the simply saline misting product.  If he is having an allergy flare he can do Flonase afterwards because it works more effectively after a nasal rinse.  I believe that with this combination of interventions he will be able to use the CPAP through the nose fully and be able to wear a more comfortable mask throughout the night.  Also I recommend nasal dilators such as Breathe Right or intake  Idiopathic peripheral neuropathy Assessment & Plan: Reviewed records, updated overview I am determined to follow up on this issues and determine the cause and improve the treatment  Orders: -     Pregabalin; Take 1 capsule (200 mg total) by mouth 2 (two) times daily.  Dispense: 180 capsule; Refill: 1  Thromboangiitis obliterans (Buerger's disease) ([place]) -     Cilostazol; Take 1 tablet (50 mg total) by mouth 2 (two) times daily.  Dispense: 90 tablet; Refill: 3  Calcification of prostate  Weight loss Assessment & Plan: Reviewed weights and they seem stable now.   Recurrent kidney stones Assessment &  Plan: Recommended increase water intake We should check vitamin D next lab draw.   Iron deficiency anemia, unspecified iron deficiency anemia type      QUESTIONS & CONCERNS: CLINICAL: please contact us via phone [phone] OR MyChart messaging  LAB & IMAGING:   We will call you if the results are significantly abnormal or you don't use MyChart.  Most normal results will be posted to MyChart immediately and have a clinical review message by Dr. Randol Kern posted within 2-3 business days.   If you have not heard from [place] regarding the results in 2 weeks OR if you need priority reporting, please contact this office. MYCHART:  The fastest way to get your results and easiest way to stay in touch with [place] is by activating your My Chart account. Instructions are located on the last page of this paperwork.  BILLING: xray and lab orders are billed from separate companies and questions./concerns should be directed to the [place].  For visit charges please discuss with our administrative services COMPLAINTS:  please let Dr. Randol Kern know or see the [place], by asking at the front desk: we want you to be satisfied with every experience and we would be grateful for the opportunity to address any problems

## 2022-06-07 NOTE — Assessment & Plan Note (Signed)
Encouraged continuing with aspirin/pletal although vascular evaluate 05/2022 negative on ankle-brachial index- due to Raynaud seems to be progressively worsening neuropathy

## 2022-06-07 NOTE — Assessment & Plan Note (Signed)
Due to patient complains of dry mouth due to this I recommended that he switch to a nasal prong facemask and in particular I recommend the ResMed P10 prongs of the nose instead of squishing down on the nose like a full facemask and that keeps the nose airways open so that he will not mouth breathe and dry out his mouth.  In addition in order for this to work it is important to keep the sinuses clear of congestion and so I recommended recommend that he also do a nasal saline mist spray into each nose hold nostril each night with the simply saline misting product.  If he is having an allergy flare he can do Flonase afterwards because it works more effectively after a nasal rinse.  I believe that with this combination of interventions he will be able to use the CPAP through the nose fully and be able to wear a more comfortable mask throughout the night.  Also I recommend nasal dilators such as Breathe Right or intake

## 2022-06-07 NOTE — Assessment & Plan Note (Signed)
Reviewed records, updated overview I am determined to follow up on this issues and determine the cause and improve the treatment

## 2022-06-07 NOTE — Assessment & Plan Note (Addendum)
Patient concerned about Voltaren help but he wakes up stiff Reassured that he Voltaren is worth risk of stiffness. Offered physical therapy but he declined- he will just do [employer].  Encouraged him to ask for our team of nurse practitioners that work at the [facility] to help him work on this

## 2022-06-07 NOTE — Assessment & Plan Note (Signed)
Reviewed weights and they seem stable now.

## 2022-06-20 DIAGNOSIS — K2970 Gastritis, unspecified, without bleeding: Secondary | ICD-10-CM | POA: Diagnosis not present

## 2022-06-20 DIAGNOSIS — K648 Other hemorrhoids: Secondary | ICD-10-CM | POA: Diagnosis not present

## 2022-06-20 DIAGNOSIS — K5730 Diverticulosis of large intestine without perforation or abscess without bleeding: Secondary | ICD-10-CM | POA: Diagnosis not present

## 2022-06-20 DIAGNOSIS — R634 Abnormal weight loss: Secondary | ICD-10-CM | POA: Diagnosis not present

## 2022-06-20 DIAGNOSIS — D509 Iron deficiency anemia, unspecified: Secondary | ICD-10-CM | POA: Diagnosis not present

## 2022-06-20 DIAGNOSIS — K449 Diaphragmatic hernia without obstruction or gangrene: Secondary | ICD-10-CM | POA: Diagnosis not present

## 2022-06-20 DIAGNOSIS — K2100 Gastro-esophageal reflux disease with esophagitis, without bleeding: Secondary | ICD-10-CM | POA: Diagnosis not present

## 2022-06-20 LAB — HM COLONOSCOPY

## 2022-06-22 DIAGNOSIS — K2100 Gastro-esophageal reflux disease with esophagitis, without bleeding: Secondary | ICD-10-CM | POA: Diagnosis not present

## 2022-06-22 DIAGNOSIS — K2970 Gastritis, unspecified, without bleeding: Secondary | ICD-10-CM | POA: Diagnosis not present

## 2022-06-26 ENCOUNTER — Other Ambulatory Visit: Payer: Self-pay

## 2022-06-26 MED ORDER — ATORVASTATIN CALCIUM 40 MG PO TABS: 40.0000 mg | ORAL_TABLET | Freq: Every day | ORAL | 3 refills | Status: DC

## 2022-07-05 ENCOUNTER — Encounter: Payer: Self-pay | Admitting: Internal Medicine

## 2022-07-05 DIAGNOSIS — K449 Diaphragmatic hernia without obstruction or gangrene: Secondary | ICD-10-CM | POA: Insufficient documentation

## 2022-07-05 DIAGNOSIS — K2970 Gastritis, unspecified, without bleeding: Secondary | ICD-10-CM | POA: Insufficient documentation

## 2022-07-24 ENCOUNTER — Telehealth: Payer: Self-pay | Admitting: Internal Medicine

## 2022-07-24 NOTE — Telephone Encounter (Signed)
Copied from CRM [id number]. Topic: Medicare AWV >> Jul 24, 2022 10:51 AM Gwenith Spitz wrote: Reason for CRM: Called patient to schedule Medicare Annual Wellness Visit (AWV). Left message for patient to call back and schedule Medicare Annual Wellness Visit (AWV).  Last date of AWV: 03/20/2019  Please schedule an appointment at any time with Inetta Fermo, [facility]. Please schedule AWVS with Inetta Fermo, [facility].  If any questions, please contact me at [phone].  Thank you ,  Gabriel [facility] AWV TEAM Direct Dial [phone]

## 2022-08-04 ENCOUNTER — Other Ambulatory Visit: Payer: Self-pay | Admitting: Internal Medicine

## 2022-08-04 DIAGNOSIS — I7300 Raynaud's syndrome without gangrene: Secondary | ICD-10-CM

## 2022-08-04 DIAGNOSIS — I731 Thromboangiitis obliterans [Buerger's disease]: Secondary | ICD-10-CM

## 2022-08-04 DIAGNOSIS — R1011 Right upper quadrant pain: Secondary | ICD-10-CM

## 2022-08-04 DIAGNOSIS — I739 Peripheral vascular disease, unspecified: Secondary | ICD-10-CM

## 2022-09-05 ENCOUNTER — Ambulatory Visit (INDEPENDENT_AMBULATORY_CARE_PROVIDER_SITE_OTHER): Payer: HMO | Admitting: Internal Medicine

## 2022-09-05 ENCOUNTER — Encounter: Payer: Self-pay | Admitting: Internal Medicine

## 2022-09-05 VITALS — BP 130/80 | HR 57 | Temp 97.6°F | Ht 70.0 in | Wt 176.4 lb

## 2022-09-05 DIAGNOSIS — R634 Abnormal weight loss: Secondary | ICD-10-CM

## 2022-09-05 DIAGNOSIS — M5136 Other intervertebral disc degeneration, lumbar region: Secondary | ICD-10-CM

## 2022-09-05 DIAGNOSIS — N183 Chronic kidney disease, stage 3 unspecified: Secondary | ICD-10-CM | POA: Diagnosis not present

## 2022-09-05 DIAGNOSIS — K2971 Gastritis, unspecified, with bleeding: Secondary | ICD-10-CM | POA: Diagnosis not present

## 2022-09-05 DIAGNOSIS — R0789 Other chest pain: Secondary | ICD-10-CM

## 2022-09-05 DIAGNOSIS — M19041 Primary osteoarthritis, right hand: Secondary | ICD-10-CM | POA: Insufficient documentation

## 2022-09-05 DIAGNOSIS — M19042 Primary osteoarthritis, left hand: Secondary | ICD-10-CM

## 2022-09-05 DIAGNOSIS — M159 Polyosteoarthritis, unspecified: Secondary | ICD-10-CM

## 2022-09-05 DIAGNOSIS — M545 Low back pain, unspecified: Secondary | ICD-10-CM | POA: Insufficient documentation

## 2022-09-05 DIAGNOSIS — G609 Hereditary and idiopathic neuropathy, unspecified: Secondary | ICD-10-CM

## 2022-09-05 DIAGNOSIS — M51369 Other intervertebral disc degeneration, lumbar region without mention of lumbar back pain or lower extremity pain: Secondary | ICD-10-CM

## 2022-09-05 DIAGNOSIS — I700 Atherosclerosis of aorta: Secondary | ICD-10-CM | POA: Diagnosis not present

## 2022-09-05 DIAGNOSIS — G2581 Restless legs syndrome: Secondary | ICD-10-CM | POA: Diagnosis not present

## 2022-09-05 DIAGNOSIS — M150 Primary generalized (osteo)arthritis: Secondary | ICD-10-CM

## 2022-09-05 LAB — MAGNESIUM: Magnesium: 1.9 mg/dL (ref 1.5–2.5)

## 2022-09-05 LAB — LIPID PANEL
Cholesterol: 123 mg/dL (ref 0–200)
HDL: 34.6 mg/dL — ABNORMAL LOW (ref >39.00)
LDL Cholesterol: 57 mg/dL (ref 0–99)
NonHDL: 88.83
Total CHOL/HDL Ratio: 4
Triglycerides: 160 mg/dL — ABNORMAL HIGH (ref 0.0–149.0)
VLDL: 32 mg/dL (ref 0.0–40.0)

## 2022-09-05 LAB — COMPREHENSIVE METABOLIC PANEL
ALT: 15 U/L (ref 0–53)
AST: 18 U/L (ref 0–37)
Albumin: 4 g/dL (ref 3.5–5.2)
Alkaline Phosphatase: 73 U/L (ref 39–117)
BUN: 19 mg/dL (ref 6–23)
CO2: 28 mEq/L (ref 19–32)
Calcium: 8.6 mg/dL (ref 8.4–10.5)
Chloride: 107 mEq/L (ref 96–112)
Creatinine, Ser: 1.17 mg/dL (ref 0.40–1.50)
GFR: 59 mL/min — ABNORMAL LOW (ref >60.00)
Glucose, Bld: 101 mg/dL — ABNORMAL HIGH (ref 70–99)
Potassium: 4.4 mEq/L (ref 3.5–5.1)
Sodium: 142 mEq/L (ref 135–145)
Total Bilirubin: 0.4 mg/dL (ref 0.2–1.2)
Total Protein: 6.5 g/dL (ref 6.0–8.3)

## 2022-09-05 LAB — MICROALBUMIN / CREATININE URINE RATIO
Creatinine,U: 97.2 mg/dL
Microalb Creat Ratio: 1.9 mg/g (ref 0.0–30.0)
Microalb, Ur: 1.8 mg/dL (ref 0.0–1.9)

## 2022-09-05 LAB — CBC WITH DIFFERENTIAL/PLATELET
Basophils Absolute: 0 10*3/uL (ref 0.0–0.1)
Basophils Relative: 0.6 % (ref 0.0–3.0)
Eosinophils Absolute: 0.2 10*3/uL (ref 0.0–0.7)
Eosinophils Relative: 2.2 % (ref 0.0–5.0)
HCT: 42.4 % (ref 39.0–52.0)
Hemoglobin: 14.1 g/dL (ref 13.0–17.0)
Lymphocytes Relative: 25.1 % (ref 12.0–46.0)
Lymphs Abs: 1.7 10*3/uL (ref 0.7–4.0)
MCHC: 33.2 g/dL (ref 30.0–36.0)
MCV: 90.2 fl (ref 78.0–100.0)
Monocytes Absolute: 0.7 10*3/uL (ref 0.1–1.0)
Monocytes Relative: 9.9 % (ref 3.0–12.0)
Neutro Abs: 4.3 10*3/uL (ref 1.4–7.7)
Neutrophils Relative %: 62.2 % (ref 43.0–77.0)
Platelets: 158 10*3/uL (ref 150.0–400.0)
RBC: 4.71 Mil/uL (ref 4.22–5.81)
RDW: 14.9 % (ref 11.5–15.5)
WBC: 6.9 10*3/uL (ref 4.0–10.5)

## 2022-09-05 LAB — PHOSPHORUS: Phosphorus: 2.1 mg/dL — ABNORMAL LOW (ref 2.3–4.6)

## 2022-09-05 LAB — FERRITIN: Ferritin: 29.4 ng/mL (ref 22.0–322.0)

## 2022-09-05 LAB — URIC ACID: Uric Acid, Serum: 5.7 mg/dL (ref 4.0–7.8)

## 2022-09-05 LAB — VITAMIN D 25 HYDROXY (VIT D DEFICIENCY, FRACTURES): VITD: 46.57 ng/mL (ref 30.00–100.00)

## 2022-09-05 MED ORDER — DICLOFENAC SODIUM 1 % EX GEL: 2.0000 g | Freq: Two times a day (BID) | CUTANEOUS | 11 refills | Status: DC

## 2022-09-05 MED ORDER — PREGABALIN 200 MG PO CAPS: 200.0000 mg | ORAL_CAPSULE | Freq: Two times a day (BID) | ORAL | 1 refills | Status: DC

## 2022-09-05 MED ORDER — CELECOXIB 200 MG PO CAPS: 200.0000 mg | ORAL_CAPSULE | Freq: Two times a day (BID) | ORAL | 3 refills | Status: DC

## 2022-09-05 NOTE — Patient Instructions (Addendum)
It was a pleasure seeing you today! Your health and satisfaction are our top priorities.   Glenetta Hew, MD  Next Steps:  [x]  Early Intervention: Schedule sooner appointment, call our on-call services, or go to emergency room if there is Increase in pain or discomfort New or worsening symptoms Sudden or severe changes in your health [x]  Flexible Follow-Up: We recommend a No follow-ups on file. for optimal routine care. This allows for progress monitoring and treatment adjustments. [x]  Preventive Care: Schedule your annual preventive care visit! It's typically covered by insurance and helps identify potential health issues early. [x]  Lab & X-ray Appointments: Incomplete tests scheduled today, or call to schedule. X-rays: [facility] (M-F, 8:30am-noon or 1pm-5pm). [x]  Medical Information Release: Sign a release form at front desk to obtain relevant medical information we don't have.  Making the Most of Our Focused (20 minute) Appointments:  [x]   Clearly state your top concerns at the beginning of the visit to focus our discussion [x]   If you anticipate you will need more time, please inform the front desk during scheduling - we can book multiple appointments in the same week. [x]   If you have transportation problems- use our convenient video appointments or ask about transportation support. [x]   We can get down to business faster if you use MyChart to update information before the visit and submit non-urgent questions before your visit. Thank you for taking the time to provide details through MyChart.  Let our nurse know and she can import this information into your encounter documents.  Arrival and Wait Times: [x]   Arriving on time ensures that everyone receives prompt attention. [x]   Early morning (8a) and afternoon (1p) appointments tend to have shortest wait times. [x]   Unfortunately, we cannot delay appointments for late arrivals or hold slots during phone  calls.  Getting Answers and Following Up  [x]   Simple Questions & Concerns: For quick questions or basic follow-up after your visit, reach [place] at [phone] or MyChart messaging. [x]   Complex Concerns: If your concern is more complex, scheduling an appointment might be best. Discuss this with the staff to find the most suitable option. [x]   Lab & Imaging Results: We'll contact you directly if results are abnormal or you don't use MyChart. Most normal results will be on MyChart within 2-3 business days, with a review message from Dr. Jon Billings. Haven't heard back in 2 weeks? Need results sooner? Contact us at [phone]. [x]   Referrals: Our referral coordinator will manage specialist referrals. The specialist's office should contact you within 2 weeks to schedule an appointment. Call us if you haven't heard from them after 2 weeks.  Staying Connected  [x]   MyChart: Activate your MyChart for the fastest way to access results and message [place]. See the last page of this paperwork for instructions on how to activate.  Bring to Your Next Appointment  [x]   Medications: Please bring all your medication bottles to your next appointment to ensure we have an accurate record of your prescriptions. [x]   Health Diaries: If you're monitoring any health conditions at home, keeping a diary of your readings can be very helpful for discussions at your next appointment.  Billing  [x]   X-ray & Lab Orders: These are billed by separate companies. Contact the invoicing company directly for questions or concerns. [x]   Visit Charges: Discuss any billing inquiries with our administrative services team.  Your Satisfaction Matters  [x]   Share Your Experience: We strive for your satisfaction!  If you have any complaints, or preferably compliments, please let Dr. Jon Billings know directly or contact our Practice Administrators, Edwena Felty or [employer], by asking at the front desk.   Reviewing Your Records  [x]    Review this early draft of your clinical encounter notes below and the final encounter summary tomorrow on MyChart after its been completed.   Lumbar back pain  Primary osteoarthritis involving multiple joints -     Diclofenac Sodium; Apply 2 g topically in the morning and at bedtime. Every morning and every night.  Dispense: 350 g; Refill: 11 -     Celecoxib; Take 1 capsule (200 mg total) by mouth 2 (two) times daily.  Dispense: 180 capsule; Refill: 3  DDD (degenerative disc disease), lumbar  Arthritis of both hands  Gastritis with hemorrhage, unspecified chronicity, unspecified gastritis type Assessment & Plan: Associated with hiatal hernia   Idiopathic peripheral neuropathy -     Pregabalin; Take 1 capsule (200 mg total) by mouth 2 (two) times daily.  Dispense: 180 capsule; Refill: 1  Weight loss  Chest discomfort  RLS (restless legs syndrome) -     Ferritin  Chronic kidney disease (CKD), active medical management without dialysis, stage 3 (moderate) (HCC) -     CBC with Differential/Platelet -     Comprehensive metabolic panel -     VITAMIN D 25 Hydroxy (Vit-D Deficiency, Fractures) -     Microalbumin / creatinine urine ratio -     Magnesium -     Phosphorus -     Uric acid  Aortic atherosclerosis (HCC) Assessment & Plan:    Orders: -     Lipid panel    VISIT SUMMARY:  During our visit, we discussed your ongoing issues with morning stiffness in your lower back, symptoms of neuropathy, and your history of gastritis, hiatal hernia, and arthritis. We also touched on your occasional chest discomfort and history of kidney disease. You are managing these conditions with various medications, which seem to be working well for you. We also discussed your restless legs syndrome, which you are managing with iron supplements.  YOUR PLAN:  -DEGENERATIVE DISC DISEASE: This is a condition where the discs in your spine wear down over time, causing stiffness and discomfort.  Continue using Voltaren gel as needed for relief. We will order a maximum supply of this gel for you.  -DEPRESSION: Your score on the depression screening questionnaire has increased since January. However, you do not feel depressed, but rather discouraged. At this time, we will not intervene, but we will continue to monitor your mental health.  -RESTLESS LEG SYNDROME: This is a condition that causes an uncontrollable urge to move your legs, especially at night. Continue taking your iron supplement. We will check your iron levels today to ensure they are adequate.  -CHRONIC KIDNEY DISEASE (STAGE 3): This is a long-term condition where your kidneys do not work as well as they should. We will check your kidney function today. Depending on the results, we may need to reduce your dose of Celebrex, as this medication can strain the kidneys.  -HYPERLIPIDEMIA: This is a condition where you have high levels of fats (lipids) in your blood. We will check your cholesterol levels today to ensure they are within a healthy range.  -GASTRITIS/HIATAL HERNIA: Gastritis is inflammation of the stomach lining, and a hiatal hernia occurs when part of your stomach pushes upward through your diaphragm. Continue taking Protonix as prescribed to manage these conditions.  INSTRUCTIONS:  We will be ordering a comprehensive lab panel today to check your iron levels, kidney function, and cholesterol levels. Please continue taking your medications as prescribed. We will schedule a follow-up appointment in 3 months to review your progress and make any necessary adjustments to your treatment plan.

## 2022-09-05 NOTE — Progress Notes (Signed)
[facility]: [phone]   Routine Medical Office Visit  Patient:  Stephen Anderson      Age: 82 y.o.       Sex:  male  Date:   09/05/2022 PCP:    Lula Olszewski, MD   Today's Healthcare Provider: Lula Olszewski, MD   Assessment and Plan:   Assessment and Plan based on [employer] AI conversational text extraction:     Degenerative Disc Disease: Increased morning stiffness, improved with activity. Using Voltaren gel with good effect. -Continue Voltaren gel 1% as needed. -Order maximum supply of Voltaren gel 1%.  Depression: Patient scored 11 on depression screening questionnaire, up from 5 in January. Patient denies feeling depressed, more so discouraged. Declines treatment. -No intervention at this time.  Restless Leg Syndrome: Patient reports restless legs, especially at night. Currently taking iron supplement for low iron levels. -Check iron levels today. -Continue iron supplement.  Chronic Kidney Disease (Stage 3): Stable for many years. Patient taking Celebrex which can strain kidneys. -Check kidney function today. -Consider reducing Celebrex dose depending on lab results.  Hyperlipidemia: Last cholesterol check 6 months ago. -Check cholesterol levels today.  Gastritis/Hiatal Hernia: Patient reports no current symptoms. Taking Protonix twice daily. -Continue Protonix as prescribed.  General Health Maintenance: -Order comprehensive lab panel today. -Follow-up appointment in 3 months.      Lumbar back pain  Primary osteoarthritis involving multiple joints -     Diclofenac Sodium; Apply 2 g topically in the morning and at bedtime. Every morning and every night.  Dispense: 350 g; Refill: 11 -     Celecoxib; Take 1 capsule (200 mg total) by mouth 2 (two) times daily.  Dispense: 180 capsule; Refill: 3  DDD (degenerative disc disease), lumbar  Arthritis of both hands  Gastritis with hemorrhage, unspecified chronicity, unspecified gastritis  type Assessment & Plan: Associated with hiatal hernia   Idiopathic peripheral neuropathy -     Pregabalin; Take 1 capsule (200 mg total) by mouth 2 (two) times daily.  Dispense: 180 capsule; Refill: 1  Weight loss  Chest discomfort  RLS (restless legs syndrome) -     Ferritin  Chronic kidney disease (CKD), active medical management without dialysis, stage 3 (moderate) (HCC) -     CBC with Differential/Platelet -     Comprehensive metabolic panel -     VITAMIN D 25 Hydroxy (Vit-D Deficiency, Fractures) -     Microalbumin / creatinine urine ratio -     Magnesium -     Phosphorus -     Uric acid  Aortic atherosclerosis (HCC) Assessment & Plan:    Orders: -     Lipid panel      Treatment plan discussed and reviewed in detail. Explained medication safety and potential side effects. Agreed on patient returning to office if symptoms worsen, persist, or new symptoms develop. Discussed precautions in case of needing to visit the Emergency Department. Answered all patient questions and confirmed understanding and comfort with the plan. Encouraged patient to contact our office if they have any questions or concerns.       Clinical Presentation:    82 y.o. male here today for 3 month follow-up and Degenerative Disk Disease  HPI  Discussed the use of AI scribe software for clinical note transcription with the patient, who gave verbal consent to proceed.  History of Present Illness  based on Abridge AI conversational text extraction:  The patient, an 82 year old with a history of degenerative disc disease,  presents with increasing morning stiffness in the lower back. The stiffness appears to be a progression from previous symptoms. Despite the stiffness, the patient remains active, engaging in physical work such as shoveling dirt and mulch without significant discomfort. However, the patient reports increased discomfort during the night and upon waking in the morning. The patient has  been managing the discomfort with Voltaren cream, which provides some relief.  The patient also reports symptoms of neuropathy, describing a lack of sensation from the knees down to the toes. Despite this, the patient is able to walk and stand without significant difficulty. The neuropathy contributes to restlessness, particularly at night, and the patient reports difficulty staying still, even during the day.  The patient has a history of gastritis and hiatal hernia, which has been managed with Protonix. The patient reports that this medication effectively controls the burning sensation associated with these conditions most of the time. The patient also has a history of arthritis in the hands, which is managed with Celebrex. The patient reports satisfaction with this medication.  The patient also reports occasional chest discomfort, although this is infrequent and the patient is under the care of a cardiologist for this issue. The patient also has a history of kidney disease, which has been stable for many years. The patient is currently taking iron supplements for restless legs syndrome, which has been somewhat effective in managing the symptoms. The patient remains active, regularly attending a local gym for exercise.      The following table summarizes the actions taken during the encounter to manage (by editing,updating, adding,and resolving)  Problem  Lumbar Back Pain   Sep 05, 2022 interim history:   gets bad am stiffness   Arthritis of Both Hands   Planned to see specialist 09/2022   Gastritis   06/2022 esophagoduodenoscopy by Dr. Bosie Clos   Weight Loss   Wt Readings from Last 10 Encounters:  09/05/22 176 lb 6.4 oz (80 kg)  06/07/22 178 lb 9.6 oz (81 kg)  06/04/22 178 lb (80.7 kg)  05/29/22 177 lb (80.3 kg)  05/08/22 173 lb 9.6 oz (78.7 kg)  04/18/22 177 lb (80.3 kg)  04/05/22 170 lb (77.1 kg)  03/06/22 178 lb 12.8 oz (81.1 kg)  10/26/21 168 lb (76.2 kg)  09/21/21 176 lb 2 oz  (79.9 kg)      Aortic Atherosclerosis (Hcc)   05/2022 ct   Ddd (Degenerative Disc Disease), Lumbar   CT abdomen 05/2022 noted severe disc space narrowing at L1-L2 with endplate changes and Schmorl nodes at this level.  Voltaren cream helps Am stiffness.   Chest Discomfort   Cardiologist Jodelle Red, MD  Associated with fatigue worsening 02/2022   Rls (Restless Legs Syndrome)   Sep 05, 2022 interim history:   still keeping awake on/off, even with cpap      Reviewed chart data: Active Ambulatory Problems    Diagnosis Date Noted   Essential tremor 05/16/2012   Idiopathic peripheral neuropathy 05/16/2012   OSA on CPAP 12/30/2013   GERD (gastroesophageal reflux disease) 02/24/2018   Mixed hyperlipidemia 02/24/2018   Osteoarthritis, multiple sites 02/24/2018   History of melanoma 03/26/2018   Insomnia 04/23/2018   B12 deficiency 05/25/2019   RLS (restless legs syndrome) 09/21/2021   Chronic fatigue 10/26/2021   Right knee pain 09/15/2020   Pain in joint of right shoulder 09/15/2020   Chest discomfort 04/05/2022   Former smoker 04/05/2022   High risk medication use 04/05/2022   Kidney cyst, acquired 04/29/2022  Fatty liver 04/29/2022   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   RUQ abdominal pain 05/08/2022   Raynaud's disease without gangrene 05/08/2022   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   Right inguinal hernia 05/21/2022   Aortic atherosclerosis (HCC) 05/21/2022   Bronchiectasis (HCC) 05/21/2022   Recurrent kidney stones 05/21/2022   Calcification of prostate 05/21/2022   DDD (degenerative disc disease), lumbar 05/21/2022   LVH (left ventricular hypertrophy) 06/05/2022   Grade I diastolic dysfunction 06/05/2022   Weight loss 06/07/2022   Gastritis 07/05/2022   Hiatal hernia 07/05/2022   Lumbar back pain 09/05/2022   Arthritis of both hands 09/05/2022   Resolved Ambulatory Problems    Diagnosis Date Noted   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   aspirin 81 MG tablet Take 81 mg by mouth every other day.    atorvastatin (LIPITOR) 40 MG tablet Take 1 tablet (40 mg total) by mouth at bedtime.   Cholecalciferol (VITAMIN D-3) 125 MCG (5000 UT) TABS Take 1 tablet by mouth daily.   cilostazol (PLETAL) 50 MG tablet Take 1 tablet (50 mg total) by mouth 2 (two) times daily.   Clobetasol Propionate 0.05 % shampoo    Cyanocobalamin (B-12 PO) Take 1 tablet by mouth daily.   fluticasone (CUTIVATE) 0.05 % cream 1 application twice a day by topical route.   fluticasone (FLONASE) 50 MCG/ACT nasal spray Place 1 spray into both nostrils daily.   ipratropium (ATROVENT) 0.06 % nasal spray Place 1-2 sprays into both nostrils 4 (four) times daily. As needed for nasal congestion   ketoconazole (NIZORAL) 2 % shampoo Apply to affected area- let sit 3-5 minutes before rinsing   NIFEdipine (PROCARDIA-XL/NIFEDICAL-XL) 30 MG 24 hr tablet TAKE 1 TABLET BY MOUTH DAILY   NON FORMULARY CPAP   pantoprazole (PROTONIX) 40 MG tablet Take 1 tablet (40 mg total) by mouth daily.   primidone (MYSOLINE) 50 MG tablet TAKE ONE TABLET BY MOUTH EVERY MORNING AND ONE TABLET EVERY EVENING   [DISCONTINUED] celecoxib (CELEBREX) 200 MG capsule Take 1 capsule (200 mg total) by mouth 2 (two) times daily.   [DISCONTINUED] diclofenac Sodium (VOLTAREN) 1 % GEL Apply 2 g topically in the morning and  at bedtime. Every morning and every night.   [DISCONTINUED] pregabalin (LYRICA) 200 MG capsule Take 1 capsule (200 mg total) by mouth 2 (two) times daily.   No facility-administered medications prior to visit.         Clinical Data Analysis:   Physical Exam  BP 130/80 (BP Location: Left Arm, Patient Position: Sitting)   Pulse (!) 57   Temp 97.6 F (36.4 C) (Temporal)   Ht 5\' 10"  (1.778 m)   Wt 176 lb 6.4 oz (80 kg)   SpO2 96%   BMI 25.31 kg/m  Wt Readings from Last 10 Encounters:  09/05/22 176 lb 6.4 oz (80 kg)  06/07/22 178 lb 9.6 oz (81 kg)  06/04/22 178 lb (80.7 kg)  05/29/22 177 lb (80.3 kg)  05/08/22 173 lb 9.6 oz (78.7 kg)  04/18/22 177 lb (80.3 kg)  04/05/22 170 lb (77.1 kg)  03/06/22 178 lb 12.8 oz (81.1 kg)  10/26/21 168 lb (76.2 kg)  09/21/21 176 lb 2 oz (79.9 kg)   Vital signs reviewed.  Nursing notes reviewed. Weight trend reviewed. Abnormalities and Problem-Specific physical exam findings:  very nice guy.   General Appearance:  No acute distress appreciable.   Well-groomed, healthy-appearing male.  Well proportioned with no abnormal fat distribution.  Good muscle tone. Skin: Clear and well-hydrated. Pulmonary:  Normal work of breathing at rest, no respiratory distress apparent. SpO2: 96 %  Musculoskeletal: All extremities are intact.  Neurological:  Awake, alert, oriented, and engaged.  No obvious focal neurological deficits or cognitive impairments.  Sensorium seems unclouded.   Speech is clear and coherent with logical content. Psychiatric:  Appropriate mood, pleasant and cooperative demeanor, thoughtful and engaged during the exam  Results Reviewed:    Results for orders placed or performed in visit on 09/05/22  CBC with Differential/Platelet  Result Value Ref Range   WBC 6.9 4.0 - 10.5 K/uL   RBC 4.71 4.22 - 5.81 Mil/uL   Hemoglobin 14.1 13.0 - 17.0 g/dL   HCT 40.9 81.1 - 91.4 %   MCV 90.2 78.0 - 100.0 fl   MCHC 33.2 30.0 - 36.0 g/dL   RDW 78.2 95.6  - 21.3 %   Platelets 158.0 150.0 - 400.0 K/uL   Neutrophils Relative % 62.2 43.0 - 77.0 %   Lymphocytes Relative 25.1 12.0 - 46.0 %   Monocytes Relative 9.9 3.0 - 12.0 %   Eosinophils Relative 2.2 0.0 - 5.0 %   Basophils Relative 0.6 0.0 - 3.0 %   Neutro Abs 4.3 1.4 - 7.7 K/uL   Lymphs Abs 1.7 0.7 - 4.0 K/uL   Monocytes Absolute 0.7 0.1 - 1.0 K/uL   Eosinophils Absolute 0.2 0.0 - 0.7 K/uL   Basophils Absolute 0.0 0.0 - 0.1 K/uL  Ferritin  Result Value Ref Range   Ferritin 29.4 22.0 - 322.0 ng/mL  Lipid panel  Result Value Ref Range   Cholesterol 123 0 - 200 mg/dL   Triglycerides 086.5 (H) 0.0 - 149.0 mg/dL   HDL 78.46 (L) >96.29 mg/dL   VLDL 52.8 0.0 - 41.3 mg/dL   LDL Cholesterol 57 0 - 99 mg/dL   Total CHOL/HDL Ratio 4    NonHDL 88.83   Comp Met (CMET)  Result Value Ref Range   Sodium 142 135 - 145 mEq/L   Potassium 4.4 3.5 - 5.1 mEq/L   Chloride 107 96 - 112 mEq/L   CO2 28 19 - 32 mEq/L   Glucose, Bld 101 (H) 70 - 99 mg/dL   BUN 19 6 - 23 mg/dL   Creatinine, Ser 2.44 0.40 - 1.50 mg/dL   Total Bilirubin 0.4 0.2 - 1.2 mg/dL   Alkaline Phosphatase 73 39 - 117 U/L   AST 18 0 - 37 U/L   ALT 15 0 - 53 U/L   Total Protein 6.5 6.0 - 8.3 g/dL   Albumin 4.0 3.5 - 5.2 g/dL   GFR 01.02 (L) >72.53 mL/min   Calcium 8.6 8.4 - 10.5 mg/dL  Vitamin D (25 hydroxy)  Result Value Ref Range   VITD 46.57 30.00 - 100.00 ng/mL  Microalbumin / creatinine urine ratio  Result Value Ref Range   Microalb, Ur 1.8 0.0 - 1.9 mg/dL   Creatinine,U 66.4 mg/dL  Microalb Creat Ratio 1.9 0.0 - 30.0 mg/g  Magnesium  Result Value Ref Range   Magnesium 1.9 1.5 - 2.5 mg/dL  Phosphorus  Result Value Ref Range   Phosphorus 2.1 (L) 2.3 - 4.6 mg/dL  Uric acid  Result Value Ref Range   Uric Acid, Serum 5.7 4.0 - 7.8 mg/dL    Recent Results (from the past 2160 hour(s))  [facility] COLONOSCOPY     Status: None   Collection Time: 06/20/22  2:50 PM  Result Value Ref Range   [facility] Colonoscopy See Report (in  chart) See Report (in chart), Patient Reported    Comment: Abstracted by HIM  CBC with Differential/Platelet     Status: None   Collection Time: 09/05/22  8:51 AM  Result Value Ref Range   WBC 6.9 4.0 - 10.5 K/uL   RBC 4.71 4.22 - 5.81 Mil/uL   Hemoglobin 14.1 13.0 - 17.0 g/dL   HCT 16.1 09.6 - 04.5 %   MCV 90.2 78.0 - 100.0 fl   MCHC 33.2 30.0 - 36.0 g/dL   RDW 40.9 81.1 - 91.4 %   Platelets 158.0 150.0 - 400.0 K/uL   Neutrophils Relative % 62.2 43.0 - 77.0 %   Lymphocytes Relative 25.1 12.0 - 46.0 %   Monocytes Relative 9.9 3.0 - 12.0 %   Eosinophils Relative 2.2 0.0 - 5.0 %   Basophils Relative 0.6 0.0 - 3.0 %   Neutro Abs 4.3 1.4 - 7.7 K/uL   Lymphs Abs 1.7 0.7 - 4.0 K/uL   Monocytes Absolute 0.7 0.1 - 1.0 K/uL   Eosinophils Absolute 0.2 0.0 - 0.7 K/uL   Basophils Absolute 0.0 0.0 - 0.1 K/uL  Ferritin     Status: None   Collection Time: 09/05/22  8:51 AM  Result Value Ref Range   Ferritin 29.4 22.0 - 322.0 ng/mL  Lipid panel     Status: Abnormal   Collection Time: 09/05/22  8:51 AM  Result Value Ref Range   Cholesterol 123 0 - 200 mg/dL    Comment: ATP III Classification       Desirable:  < 200 mg/dL               Borderline High:  200 - 239 mg/dL          High:  > = 782 mg/dL   Triglycerides 956.2 (H) 0.0 - 149.0 mg/dL    Comment: Normal:  <130 mg/dLBorderline High:  150 - 199 mg/dL   HDL 86.57 (L) >84.69 mg/dL   VLDL 62.9 0.0 - 52.8 mg/dL   LDL Cholesterol 57 0 - 99 mg/dL   Total CHOL/HDL Ratio 4     Comment:                Men          Women1/2 Average Risk     3.4          3.3Average Risk          5.0          4.42X Average Risk          9.6          7.13X Average Risk          15.0          11.0                       NonHDL 88.83     Comment: NOTE:  Non-HDL  goal should be 30 mg/dL higher than patient's LDL goal (i.e. LDL goal of < 70 mg/dL, would have non-HDL goal of < 100 mg/dL)  Comp Met (CMET)     Status: Abnormal   Collection Time: 09/05/22  8:51 AM  Result  Value Ref Range   Sodium 142 135 - 145 mEq/L   Potassium 4.4 3.5 - 5.1 mEq/L   Chloride 107 96 - 112 mEq/L   CO2 28 19 - 32 mEq/L   Glucose, Bld 101 (H) 70 - 99 mg/dL   BUN 19 6 - 23 mg/dL   Creatinine, Ser 1.61 0.40 - 1.50 mg/dL   Total Bilirubin 0.4 0.2 - 1.2 mg/dL   Alkaline Phosphatase 73 39 - 117 U/L   AST 18 0 - 37 U/L   ALT 15 0 - 53 U/L   Total Protein 6.5 6.0 - 8.3 g/dL   Albumin 4.0 3.5 - 5.2 g/dL   GFR 09.60 (L) >45.40 mL/min    Comment: Calculated using the CKD-EPI Creatinine Equation (2021)   Calcium 8.6 8.4 - 10.5 mg/dL  Vitamin D (25 hydroxy)     Status: None   Collection Time: 09/05/22  8:51 AM  Result Value Ref Range   VITD 46.57 30.00 - 100.00 ng/mL  Microalbumin / creatinine urine ratio     Status: None   Collection Time: 09/05/22  8:51 AM  Result Value Ref Range   Microalb, Ur 1.8 0.0 - 1.9 mg/dL   Creatinine,U 98.1 mg/dL   Microalb Creat Ratio 1.9 0.0 - 30.0 mg/g  Magnesium     Status: None   Collection Time: 09/05/22  8:51 AM  Result Value Ref Range   Magnesium 1.9 1.5 - 2.5 mg/dL  Phosphorus     Status: Abnormal   Collection Time: 09/05/22  8:51 AM  Result Value Ref Range   Phosphorus 2.1 (L) 2.3 - 4.6 mg/dL  Uric acid     Status: None   Collection Time: 09/05/22  8:51 AM  Result Value Ref Range   Uric Acid, Serum 5.7 4.0 - 7.8 mg/dL    No image results found.   No results found.     Signed: Lula Olszewski, MD 09/05/2022 6:32 PM

## 2022-09-05 NOTE — Assessment & Plan Note (Signed)
Associated with hiatal hernia

## 2022-09-23 NOTE — Progress Notes (Signed)
Office Visit Note  Patient: Stephen Anderson             Date of Birth: 07-04-1940           MRN: [id number]             PCP: Lula Olszewski, MD Referring: Lula Olszewski, MD Visit Date: 09/24/2022   Subjective:  New Patient (Initial Visit) (Arthritis, neuropathy)   History of Present Illness: SONNY ANDREOTTI is a 82 y.o. male here for evaluation of positive ANA checked in association with raynaud's symptoms intermittent pallor affecting fingers and low peripheral oxygen measurements.  This is provoked with cold exposure and can take minutes or up to an hour in some cases for rewarming and fingers resuming their normal appearance.  Never associated with any skin blistering peeling or pitting.  Feet with numbness this neuropathy affects up to the level of the knee on both legs.  The problem is ongoing for years initially numbness of both feet that has progressed gradually over time. Had had previous ultrasound studies for PAD that were unremarkable in 2015 and repeat in February of this year still with normal ABI and triphasic waveform.  He does not typically experience similar numbness affecting the hands.  He was on gabapentin but switched to Lyrica which is partially beneficial but symptoms still bother him especially with pressure and cold surfaces contacting his feet or at nighttime.  He is prescribed nifedipine 30 mg daily and takes Pletal 50 mg twice daily. Besides these complaints he also discusses chronic symptoms from osteoarthritis and degenerative disc disease.  He has been very physically active for work for many decades and although retired now stays very active.  Symptoms do not severely limit his activities but has increased cysts and pain and stiffness if he exerts himself.  Previous x-ray imaging reviewed from 2020 through 2022 demonstrating degenerative changes most appearing relatively mild.  He does not notice a lot of visible swelling.  He is taking Celebrex 200 mg twice  daily and using topical Voltaren 1% feels without these he would not be able to function very well.  He had EGD in March with some evidence of gastritis and esophagitis. He is a former daily cigarette smoker discontinued since 20 years ago. Has OSA with CPAP.  Labs reviewed ANA 1:40 cytoplasmic 1:80 homogenous ANCA neg  Activities of Daily Living:  Patient reports morning stiffness for 2 hours.   Patient Reports nocturnal pain.  Difficulty dressing/grooming: Denies Difficulty climbing stairs: Denies Difficulty getting out of chair: Denies Difficulty using hands for taps, buttons, cutlery, and/or writing: Denies  Review of Systems  Constitutional:  Negative for fatigue.  HENT:  Positive for mouth dryness. Negative for mouth sores.   Eyes:  Negative for dryness.  Respiratory:  Negative for shortness of breath.   Cardiovascular:  Negative for chest pain and palpitations.  Gastrointestinal:  Negative for blood in stool, constipation and diarrhea.  Endocrine: Positive for increased urination.  Genitourinary:  Negative for involuntary urination.  Musculoskeletal:  Positive for joint pain, gait problem, joint pain, joint swelling, myalgias, morning stiffness and myalgias. Negative for muscle weakness and muscle tenderness.  Skin:  Positive for hair loss and sensitivity to sunlight. Negative for color change and rash.  Allergic/Immunologic: Negative for susceptible to infections.  Neurological:  Negative for dizziness and headaches.  Hematological:  Negative for swollen glands.  Psychiatric/Behavioral:  Positive for sleep disturbance. Negative for depressed mood. The patient is nervous/anxious.  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   Right inguinal hernia 05/21/2022   Aortic atherosclerosis (HCC) 05/21/2022   Bronchiectasis (HCC) 05/21/2022   Recurrent kidney stones 05/21/2022   Calcification of prostate 05/21/2022   DDD (degenerative disc disease), lumbar 05/21/2022   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   RUQ abdominal pain 05/08/2022   Raynaud's disease without gangrene 05/08/2022   Kidney cyst, acquired 04/29/2022   Fatty liver 04/29/2022   Chest discomfort 04/05/2022   Former smoker 04/05/2022   High risk medication use 04/05/2022   Chronic fatigue 10/26/2021   RLS (restless legs syndrome) 09/21/2021   Right knee pain 09/15/2020   Pain in joint of right shoulder 09/15/2020   B12 deficiency 05/25/2019   Insomnia 04/23/2018   History of melanoma 03/26/2018   GERD (gastroesophageal reflux disease) 02/24/2018   Mixed hyperlipidemia 02/24/2018   Osteoarthritis, multiple sites 02/24/2018   OSA on CPAP 12/30/2013   Essential tremor 05/16/2012   Idiopathic peripheral neuropathy 05/16/2012    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)   Chest discomfort 04/05/2022   Associated with fatigue worsening 02/2022   Essential tremor    GERD (gastroesophageal reflux disease) 02/24/2018   High risk medication use 04/05/2022   Lyrica for neuropathy     History of melanoma 03/26/2018   S/p excision on back   Hypercholesteremia    Iron deficiency anemia 06/07/2022   Lab Results  Component  Value  Date/Time     HGB  14.8  05/08/2022 09:12 AM     HGB  13.9  03/06/2022 08:44 AM     HGB  11.9 (L)  10/26/2021 08:37 AM     HGB  13.1  06/27/2021 08:58 AM     HGB  14.8  05/24/2020 08:17 AM         Mixed hyperlipidemia 02/24/2018   Peripheral  neuropathy    RUQ abdominal pain 05/08/2022   We did ultrasound abdomen 04/26/22 to evaluate pain he has where it feels like right upper quadrant of abdomen pain flared by leaning forward feels like liver tip rolling over but that was negative   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   well diff-right hand- txpbx   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 ([id number])   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   in situ-right parotid area   Unintentional weight loss 04/05/2022   Wt Readings from Last 20 Encounters: 04/05/22 170 lb (77.1 kg) 03/06/22 178 lb 12.8 oz (81.1 kg) 10/26/21 168 lb (76.2 kg) 09/21/21 176 lb 2 oz (79.9 kg) 07/05/21 182 lb 6.4 oz (82.7 kg) 06/27/21 179 lb (81.2 kg) 06/01/21 178 lb 12.8 oz (81.1 kg) 01/10/21 174 lb 6.4 oz (79.1 kg) 12/19/20 171 lb (77.6 kg) 08/29/20 178 lb 6.4 oz (80.9 kg) 06/15/20 184 lb 12.8 oz (83.8 kg) 06/02/20 183 lb    Family History  Problem Relation Age of Onset   Lung cancer Father    Bladder Cancer Brother  Heart disease Brother    Neuropathy Daughter    Past Surgical History:  Procedure Laterality Date   CATARACT EXTRACTION, BILATERAL     GALLBLADDER SURGERY     HERNIA REPAIR     bilateral   MELANOMA EXCISION     Social History   Social History Narrative   Right handed    Lives with wife   1 story   Immunization History  Administered Date(s) Administered   Fluad Quad(high Dose 65+) 01/14/2019   PFIZER(Purple Top)SARS-COV-2 Vaccination 05/04/2019, 05/25/2019, 01/17/2020   Pneumococcal Conjugate-13 10/21/2013   Pneumococcal Polysaccharide-23 07/17/2010   Td 01/07/2009   Tdap 07/10/2010, 05/08/2022   Zoster Recombinat (Shingrix) 04/03/2017, 06/17/2017   Zoster, Live 03/16/2017     Objective: Vital Signs: BP 125/67 (BP Location: Left Arm, Patient Position: Sitting, Cuff Size: Normal)   Pulse 70   Resp 16   Ht 5' 8.5" (1.74 m)   Wt 176 lb (79.8 kg)   BMI 26.37 kg/m    Physical  Exam HENT:     Mouth/Throat:     Mouth: Mucous membranes are moist.     Pharynx: Oropharynx is clear.  Eyes:     Conjunctiva/sclera: Conjunctivae normal.  Cardiovascular:     Rate and Rhythm: Normal rate and regular rhythm.  Pulmonary:     Effort: Pulmonary effort is normal.     Breath sounds: Normal breath sounds.  Musculoskeletal:     Right lower leg: No edema.     Left lower leg: No edema.  Lymphadenopathy:     Cervical: No cervical adenopathy.  Skin:    General: Skin is warm and dry.     Findings: No rash.     Comments: Enlarged capillary loops visible at right 4th digit without other specific changes No digital pitting  Neurological:     Mental Status: He is alert.  Psychiatric:        Mood and Affect: Mood normal.      Musculoskeletal Exam:  Shoulders full ROM no tenderness or swelling Elbows full ROM no tenderness or swelling Wrists full ROM no tenderness or swelling Fingers full ROM, tenderness to pressure of 2nd MCP joint without swelling, tender nodule proximal to right 4th MCP flexor side Knees full ROM no tenderness or swelling Ankles full ROM no tenderness or swelling   Investigation: No additional findings.  Imaging: No results found.  Recent Labs: Lab Results  Component Value Date   WBC 6.9 09/05/2022   HGB 14.1 09/05/2022   PLT 158.0 09/05/2022   NA 142 09/05/2022   K 4.4 09/05/2022   CL 107 09/05/2022   CO2 28 09/05/2022   GLUCOSE 101 (H) 09/05/2022   BUN 19 09/05/2022   CREATININE 1.17 09/05/2022   BILITOT 0.4 09/05/2022   ALKPHOS 73 09/05/2022   AST 18 09/05/2022   ALT 15 09/05/2022   PROT 6.5 09/05/2022   ALBUMIN 4.0 09/05/2022   CALCIUM 8.6 09/05/2022    Speciality Comments: No specialty comments available.  Procedures:  No procedures performed Allergies: Hydrocodone, Oxycodone, Oxycontin [oxycodone hcl], and Simvastatin   Assessment / Plan:     Visit Diagnoses: Positive antinuclear antibody - Plan: Rheumatoid factor,  Sedimentation rate, Anti-scleroderma antibody, RNP Antibody, Anti-Smith antibody, Sjogrens syndrome-A extractable nuclear antibody, Anti-DNA antibody, double-stranded, C3 and C4  Positive ANA and changes consistent with raynaud's otherwise joint pain and neuropathy pretty nonspecific. Checking antibody panel as details also sed rate and complement. If significantly abnormal can f/u discuss treatment options to try  vs symptomatic management.  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  He has nonspecific changes with nailfold capillary dilatation but only in 1 finger.  Describes symptoms pretty consistently provoked by cold exposure not having any with exertion.  Onset and progression of symptoms years after smoking cessation is less typical for Buerger's.  Idiopathic peripheral neuropathy  Mechanism appears unclear as he is not diabetic and lower extremity arterial studies remain totally normal. Mononeuritis not typical in symmetric length dependent areas and without motor dysfunction. CIDP unlikely with limited distribution after the number of years.  Recurrent kidney stones  Recurrent kidney stones no significant renal insufficiency hypercalcemia or hyperuricemia noted on review of labs.  No evidence of chondrocalcinosis on today's or reviewed previous imaging.  Arthritis of both hands - Plan: XR Hand 2 View Right, XR Hand 2 View Left  Joint pain in both hands appears consistent with primary osteoarthritis.  No synovitis appreciable on exam in clinic today and x-ray shows mostly generalized degenerative changes.  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  Trigger finger, right ring finger  Has definite trigger finger with flexor tendon nodule of the right fourth digit A1 pulley not currently severely painful or limiting activity  discussed with the option for treating if more symptomatic.  Orders: Orders Placed This Encounter  Procedures   XR Hand 2 View Right   XR Hand 2 View Left   Rheumatoid factor   Sedimentation rate   Anti-scleroderma antibody   RNP Antibody   Anti-Smith antibody   Sjogrens syndrome-A extractable nuclear antibody   Anti-DNA antibody, double-stranded   C3 and C4   No orders of the defined types were placed in this encounter.    Follow-Up Instructions: Return in about 2 weeks (around 10/08/2022) for New pt ANA/raynaud/OA f/u 2-3wks.   Fuller Plan, MD  Note - This record has been created using [employer].  Chart creation errors have been sought, but may not always  have been located. Such creation errors do not reflect on  the standard of medical care.

## 2022-09-24 ENCOUNTER — Ambulatory Visit (INDEPENDENT_AMBULATORY_CARE_PROVIDER_SITE_OTHER): Payer: HMO

## 2022-09-24 ENCOUNTER — Encounter: Payer: Self-pay | Admitting: Internal Medicine

## 2022-09-24 ENCOUNTER — Ambulatory Visit: Payer: HMO | Attending: Internal Medicine | Admitting: Internal Medicine

## 2022-09-24 ENCOUNTER — Ambulatory Visit: Payer: HMO

## 2022-09-24 VITALS — BP 125/67 | HR 70 | Resp 16 | Ht 68.5 in | Wt 176.0 lb

## 2022-09-24 DIAGNOSIS — M65341 Trigger finger, right ring finger: Secondary | ICD-10-CM | POA: Diagnosis not present

## 2022-09-24 DIAGNOSIS — M19041 Primary osteoarthritis, right hand: Secondary | ICD-10-CM

## 2022-09-24 DIAGNOSIS — I731 Thromboangiitis obliterans [Buerger's disease]: Secondary | ICD-10-CM | POA: Diagnosis not present

## 2022-09-24 DIAGNOSIS — R768 Other specified abnormal immunological findings in serum: Secondary | ICD-10-CM | POA: Diagnosis not present

## 2022-09-24 DIAGNOSIS — M19042 Primary osteoarthritis, left hand: Secondary | ICD-10-CM

## 2022-09-24 DIAGNOSIS — N200 Calculus of kidney: Secondary | ICD-10-CM | POA: Diagnosis not present

## 2022-09-24 DIAGNOSIS — G609 Hereditary and idiopathic neuropathy, unspecified: Secondary | ICD-10-CM | POA: Diagnosis not present

## 2022-09-24 DIAGNOSIS — I7300 Raynaud's syndrome without gangrene: Secondary | ICD-10-CM | POA: Diagnosis not present

## 2022-09-25 LAB — RHEUMATOID FACTOR: Rheumatoid fact SerPl-aCnc: <10 IU/mL (ref <14)

## 2022-09-25 LAB — RNP ANTIBODY: Ribonucleic Protein(ENA) Antibody, IgG: <1 AI

## 2022-09-25 LAB — ANTI-DNA ANTIBODY, DOUBLE-STRANDED: ds DNA Ab: 1 IU/mL

## 2022-09-25 LAB — SJOGRENS SYNDROME-A EXTRACTABLE NUCLEAR ANTIBODY: SSA (Ro) (ENA) Antibody, IgG: <1 AI

## 2022-09-25 LAB — ANTI-SCLERODERMA ANTIBODY: Scleroderma (Scl-70) (ENA) Antibody, IgG: <1 AI

## 2022-09-25 LAB — C3 AND C4
C3 Complement: 124 mg/dL (ref 82–185)
C4 Complement: 21 mg/dL (ref 15–53)

## 2022-09-25 LAB — SEDIMENTATION RATE: Sed Rate: 2 mm/h (ref 0–20)

## 2022-09-25 LAB — ANTI-SMITH ANTIBODY: ENA SM Ab Ser-aCnc: <1 AI

## 2022-10-18 NOTE — Progress Notes (Signed)
Office Visit Note  Patient: Stephen Anderson             Date of Birth: Sep 23, 1940           MRN: [id number]             PCP: Lula Olszewski, MD Referring: Lula Olszewski, MD Visit Date: 10/19/2022   Subjective:  Other (Patient reports pain in bilateral hands, between the joints)   History of Present Illness: Stephen Anderson is a 82 y.o. male here for follow up for bilateral hand pain with Raynaud's symptoms and positive ANA.  Lab testing at our initial visit was negative for rheumatoid factor, specific extractable nuclear antibodies, sedimentation rate, and serum complements.  X-ray of bilateral hands demonstrated some osteoarthritis mostly mild appearing and in the distal finger joints, slightly worse at the left index finger.  No significant change in symptoms since our last visit complains about stabbing pain he gets proximal to the right fourth finger on his palm.  Says this happens about twice a week.  Not having any circulation problems lately due to warmer weather.  He is on the Lyrica consistently and Celebrex and topical Voltaren as needed for hand pain.  He is on the nifedipine 30 mg daily since January.  Previous HPI 09/24/22 Stephen Anderson is a 82 y.o. male here for evaluation of positive ANA checked in association with raynaud's symptoms intermittent pallor affecting fingers and low peripheral oxygen measurements.  This is provoked with cold exposure and can take minutes or up to an hour in some cases for rewarming and fingers resuming their normal appearance.  Never associated with any skin blistering peeling or pitting.  Feet with numbness this neuropathy affects up to the level of the knee on both legs.  The problem is ongoing for years initially numbness of both feet that has progressed gradually over time. Had had previous ultrasound studies for PAD that were unremarkable in 2015 and repeat in February of this year still with normal ABI and triphasic waveform.  He does not  typically experience similar numbness affecting the hands.  He was on gabapentin but switched to Lyrica which is partially beneficial but symptoms still bother him especially with pressure and cold surfaces contacting his feet or at nighttime.  He is prescribed nifedipine 30 mg daily and takes Pletal 50 mg twice daily. Besides these complaints he also discusses chronic symptoms from osteoarthritis and degenerative disc disease.  He has been very physically active for work for many decades and although retired now stays very active.  Symptoms do not severely limit his activities but has increased cysts and pain and stiffness if he exerts himself.  Previous x-ray imaging reviewed from 2020 through 2022 demonstrating degenerative changes most appearing relatively mild.  He does not notice a lot of visible swelling.  He is taking Celebrex 200 mg twice daily and using topical Voltaren 1% feels without these he would not be able to function very well.  He had EGD in March with some evidence of gastritis and esophagitis. He is a former daily cigarette smoker discontinued since 20 years ago. Has OSA with CPAP.   Labs reviewed ANA 1:40 cytoplasmic 1:80 homogenous ANCA neg   Review of Systems  Constitutional:  Positive for fatigue.  HENT:  Positive for mouth dryness. Negative for mouth sores.   Eyes:  Negative for dryness.  Respiratory:  Negative for shortness of breath.   Cardiovascular:  Negative for chest pain and  palpitations.  Gastrointestinal:  Negative for blood in stool, constipation and diarrhea.  Endocrine: Negative for increased urination.  Genitourinary:  Negative for involuntary urination.  Musculoskeletal:  Positive for joint pain, joint pain, joint swelling, myalgias, morning stiffness, muscle tenderness and myalgias. Negative for gait problem and muscle weakness.  Skin:  Positive for color change, rash and sensitivity to sunlight.  Allergic/Immunologic: Negative for susceptible to  infections.  Neurological:  Positive for numbness. Negative for dizziness and headaches.  Hematological:  Negative for swollen glands.  Psychiatric/Behavioral:  Positive for sleep disturbance. Negative for depressed mood. The patient is not nervous/anxious.     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   Right inguinal hernia 05/21/2022   Aortic atherosclerosis (HCC) 05/21/2022   Bronchiectasis (HCC) 05/21/2022   Recurrent kidney stones 05/21/2022   Calcification of prostate 05/21/2022   DDD (degenerative disc disease), lumbar 05/21/2022   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   RUQ abdominal pain 05/08/2022   Raynaud's disease without gangrene 05/08/2022   Kidney cyst, acquired 04/29/2022   Fatty liver 04/29/2022   Chest discomfort 04/05/2022   Former smoker 04/05/2022   High risk medication use 04/05/2022   Chronic fatigue 10/26/2021   RLS (restless legs syndrome) 09/21/2021   Right knee pain 09/15/2020   Pain in joint of right shoulder 09/15/2020   B12 deficiency 05/25/2019   Insomnia 04/23/2018   History of melanoma 03/26/2018   GERD (gastroesophageal reflux disease) 02/24/2018   Mixed hyperlipidemia 02/24/2018   Osteoarthritis, multiple sites 02/24/2018   OSA on CPAP 12/30/2013   Essential tremor 05/16/2012   Idiopathic peripheral neuropathy 05/16/2012    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)   Chest discomfort 04/05/2022   Associated with fatigue  worsening 02/2022   Essential tremor    GERD (gastroesophageal reflux disease) 02/24/2018   High risk medication use 04/05/2022   Lyrica for neuropathy     History of melanoma 03/26/2018   S/p excision on back   Hypercholesteremia    Iron deficiency anemia 06/07/2022   Lab Results  Component  Value  Date/Time     HGB  14.8  05/08/2022 09:12 AM     HGB  13.9  03/06/2022 08:44 AM     HGB  11.9 (L)  10/26/2021 08:37 AM     HGB  13.1  06/27/2021 08:58 AM     HGB  14.8  05/24/2020 08:17 AM         Mixed hyperlipidemia 02/24/2018   Peripheral neuropathy    RUQ abdominal pain 05/08/2022   We did ultrasound abdomen 04/26/22 to evaluate pain he has where it feels like right upper quadrant of abdomen pain flared by leaning forward feels like liver tip rolling over but that was negative   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   well diff-right hand- txpbx   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 ([id number])   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   in situ-right parotid area  Unintentional weight loss 04/05/2022   Wt Readings from Last 20 Encounters: 04/05/22 170 lb (77.1 kg) 03/06/22 178 lb 12.8 oz (81.1 kg) 10/26/21 168 lb (76.2 kg) 09/21/21 176 lb 2 oz (79.9 kg) 07/05/21 182 lb 6.4 oz (82.7 kg) 06/27/21 179 lb (81.2 kg) 06/01/21 178 lb 12.8 oz (81.1 kg) 01/10/21 174 lb 6.4 oz (79.1 kg) 12/19/20 171 lb (77.6 kg) 08/29/20 178 lb 6.4 oz (80.9 kg) 06/15/20 184 lb 12.8 oz (83.8 kg) 06/02/20 183 lb    Family History  Problem Relation Age of Onset   Lung cancer Father    Bladder Cancer Brother    Heart disease Brother    Neuropathy Daughter    Past Surgical History:  Procedure Laterality Date   CATARACT EXTRACTION, BILATERAL     GALLBLADDER SURGERY     HERNIA REPAIR     bilateral   MELANOMA EXCISION     Social History   Social History Narrative   Right handed    Lives with wife   1 story   Immunization History  Administered Date(s)  Administered   Fluad Quad(high Dose 65+) 01/14/2019   PFIZER(Purple Top)SARS-COV-2 Vaccination 05/04/2019, 05/25/2019, 01/17/2020   Pneumococcal Conjugate-13 10/21/2013   Pneumococcal Polysaccharide-23 07/17/2010   Td 01/07/2009   Tdap 07/10/2010, 05/08/2022   Zoster Recombinant(Shingrix) 04/03/2017, 06/17/2017   Zoster, Live 03/16/2017     Objective: Vital Signs: BP 136/74 (BP Location: Left Arm, Patient Position: Sitting, Cuff Size: Normal)   Pulse 67   Resp 15   Ht 5' 9.5" (1.765 m)   Wt 176 lb 3.2 oz (79.9 kg)   BMI 25.65 kg/m    Physical Exam Cardiovascular:     Rate and Rhythm: Normal rate and regular rhythm.  Pulmonary:     Effort: Pulmonary effort is normal.     Breath sounds: Normal breath sounds.  Musculoskeletal:     Right lower leg: No edema.     Left lower leg: No edema.  Skin:    Findings: No rash.     Comments: No digital pitting  Neurological:     Mental Status: He is alert.  Psychiatric:        Mood and Affect: Mood normal.      Musculoskeletal Exam:  Shoulders full ROM no tenderness or swelling Elbows full ROM no tenderness or swelling Wrists full ROM no tenderness or swelling Fingers full ROM, tender nodule on palm proximal to right 4th MCP  Knees full ROM no tenderness or swelling Ankles full ROM no tenderness or swelling  Investigation: No additional findings.  Imaging: XR Hand 2 View Left  Result Date: 09/24/2022 X-ray left hand 2 views Radiocarpal and carpal joint spaces appear normal.  Subchondral cyst at head of the first metacarpal.  May be slight narrowing of third MCP joint space.  Very early osteophyte processes. PIPs normal. 2nd DIP with more advanced joint space loss and marginal osteophytes but no erosive changes. Bone mineralization otherwise normal. Impression Very mild degenerative arthritis changes except more advanced at the second DIP joint  XR Hand 2 View Right  Result Date: 09/24/2022 X-ray right hand 2 views Radiocarpal  joint space appears normal.  Few cystic changes present in the carpal bones minimal at first [facility] joint arthritis.  Small marginal osteophyte formation at MCP joints with narrowing at the second and fourth MCPs and subchondral cyst worst at the fifth MCP.  Marginal osteophytes at PIP and DIP joints with slight asymmetric space narrowing at the DIPs.  No  erosions or abnormal calcifications seen. Impression Mild appearing generalized degenerative changes, appears to be progression from 2022 xray with 2nd and 4th MCP narrowing   Recent Labs: Lab Results  Component Value Date   WBC 6.9 09/05/2022   HGB 14.1 09/05/2022   PLT 158.0 09/05/2022   NA 142 09/05/2022   K 4.4 09/05/2022   CL 107 09/05/2022   CO2 28 09/05/2022   GLUCOSE 101 (H) 09/05/2022   BUN 19 09/05/2022   CREATININE 1.17 09/05/2022   BILITOT 0.4 09/05/2022   ALKPHOS 73 09/05/2022   AST 18 09/05/2022   ALT 15 09/05/2022   PROT 6.5 09/05/2022   ALBUMIN 4.0 09/05/2022   CALCIUM 8.6 09/05/2022    Speciality Comments: No specialty comments available.  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  No evidence of active systemic inflammation.  With negative labs and lack of any skin thickening suggestive against any lupus or systemic sclerosis.  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  So far has not had any ischemic complications and symptoms are very mild at the moment.  I agree with the addition of low-dose nifedipine that he is tolerating fine there would be room to titrate up if symptoms get worse again  into colder weather.  Would be a candidate for phosphodiesterase inhibitors if symptoms get more refractory as he is not on any chronic nitrates.  Arthritis of both hands  Bilateral hand changes most consistent with osteoarthritis.  Did not have any synovitis on prior exam including musculoskeletal ultrasound combined with normal labs is reassuring.  Agree with the current treatment plan including Lyrica and as needed Celebrex and Voltaren.  Also provided handout on supplemental treatments for osteoarthritis pain.  Trigger finger, right ring finger  Briefly discussed again the flexor tendon nodule on his right hand not having too much triggering but is getting pretty significant pain intermittently.  Not currently bad enough that he wants to pursue any local injection or aggressive treatment.  Can follow-up as needed.  Orders: No orders of the defined types were placed in this encounter.  No orders of the defined types were placed in this encounter.    Follow-Up Instructions: Return if symptoms worsen or fail to improve.   Fuller Plan, MD  Note - This record has been created using [employer].  Chart creation errors have been sought, but may not always  have been located. Such creation errors do not reflect on  the standard of medical care.

## 2022-10-19 ENCOUNTER — Ambulatory Visit: Payer: HMO | Attending: Internal Medicine | Admitting: Internal Medicine

## 2022-10-19 ENCOUNTER — Encounter: Payer: Self-pay | Admitting: Internal Medicine

## 2022-10-19 VITALS — BP 136/74 | HR 67 | Resp 15 | Ht 69.5 in | Wt 176.2 lb

## 2022-10-19 DIAGNOSIS — M19041 Primary osteoarthritis, right hand: Secondary | ICD-10-CM | POA: Diagnosis not present

## 2022-10-19 DIAGNOSIS — M65341 Trigger finger, right ring finger: Secondary | ICD-10-CM | POA: Diagnosis not present

## 2022-10-19 DIAGNOSIS — I731 Thromboangiitis obliterans [Buerger's disease]: Secondary | ICD-10-CM

## 2022-10-19 DIAGNOSIS — M19042 Primary osteoarthritis, left hand: Secondary | ICD-10-CM | POA: Diagnosis not present

## 2022-10-19 DIAGNOSIS — I7300 Raynaud's syndrome without gangrene: Secondary | ICD-10-CM

## 2022-10-19 NOTE — Patient Instructions (Signed)
I don't see any evidence of an inflammatory condition causing arthritis or circulation problems. We can follow up as needed about your hand arthritis or the finger nodule. I will send a note to Dr. Jon Billings regarding our findings.   For osteoarthritis several treatments may be beneficial:  - Topical antiinflammatory medicine such as diclofenac or Voltaren can be applied to  affected area as needed. Topical analgesics containing CBD, menthol, or lidocaine can be tried.  - Oral nonsteroidal antiinflammatory drugs (NSAIDs) such as ibuprofen, aleve, celebrex, or mobic are usually helpful for osteoarthritis. These should be taken intermittently or as needed, and always taken with food.  - Turmeric has some antiinflammatory effect similar to NSAIDs and may help, if taken as a supplement should not be taken above recommended doses.   - Compressive gloves or sleeve can be helpful to support the joint especially if hurting or swelling with certain activities.  - Physical therapy referral can discuss exercises or activity modification to improve symptoms or strength if needed.  - Local steroid injection is an option if symptoms become worse and not controlled by the above options.

## 2022-11-07 ENCOUNTER — Encounter (INDEPENDENT_AMBULATORY_CARE_PROVIDER_SITE_OTHER): Payer: Self-pay

## 2022-11-26 DIAGNOSIS — D225 Melanocytic nevi of trunk: Secondary | ICD-10-CM | POA: Diagnosis not present

## 2022-11-26 DIAGNOSIS — L570 Actinic keratosis: Secondary | ICD-10-CM | POA: Diagnosis not present

## 2022-11-26 DIAGNOSIS — X32XXXD Exposure to sunlight, subsequent encounter: Secondary | ICD-10-CM | POA: Diagnosis not present

## 2022-11-29 DIAGNOSIS — H52203 Unspecified astigmatism, bilateral: Secondary | ICD-10-CM | POA: Diagnosis not present

## 2022-11-29 DIAGNOSIS — Z961 Presence of intraocular lens: Secondary | ICD-10-CM | POA: Diagnosis not present

## 2022-12-06 ENCOUNTER — Encounter: Payer: Self-pay | Admitting: Internal Medicine

## 2022-12-06 ENCOUNTER — Ambulatory Visit (INDEPENDENT_AMBULATORY_CARE_PROVIDER_SITE_OTHER): Payer: HMO | Admitting: Internal Medicine

## 2022-12-06 VITALS — BP 116/64 | HR 67 | Temp 97.6°F | Ht 69.5 in | Wt 174.8 lb

## 2022-12-06 DIAGNOSIS — G609 Hereditary and idiopathic neuropathy, unspecified: Secondary | ICD-10-CM

## 2022-12-06 DIAGNOSIS — I739 Peripheral vascular disease, unspecified: Secondary | ICD-10-CM | POA: Diagnosis not present

## 2022-12-06 DIAGNOSIS — G629 Polyneuropathy, unspecified: Secondary | ICD-10-CM | POA: Diagnosis not present

## 2022-12-06 DIAGNOSIS — M159 Polyosteoarthritis, unspecified: Secondary | ICD-10-CM | POA: Diagnosis not present

## 2022-12-06 DIAGNOSIS — I731 Thromboangiitis obliterans [Buerger's disease]: Secondary | ICD-10-CM | POA: Diagnosis not present

## 2022-12-06 DIAGNOSIS — I7300 Raynaud's syndrome without gangrene: Secondary | ICD-10-CM | POA: Diagnosis not present

## 2022-12-06 DIAGNOSIS — L218 Other seborrheic dermatitis: Secondary | ICD-10-CM

## 2022-12-06 DIAGNOSIS — R5383 Other fatigue: Secondary | ICD-10-CM

## 2022-12-06 DIAGNOSIS — R1011 Right upper quadrant pain: Secondary | ICD-10-CM

## 2022-12-06 MED ORDER — NIFEDIPINE ER OSMOTIC RELEASE 60 MG PO TB24: 60.0000 mg | ORAL_TABLET | Freq: Every day | ORAL | 3 refills | Status: DC

## 2022-12-06 MED ORDER — KETOCONAZOLE 2 % EX SHAM: MEDICATED_SHAMPOO | CUTANEOUS | 5 refills | Status: DC

## 2022-12-06 MED ORDER — PREGABALIN 100 MG PO CAPS: 100.0000 mg | ORAL_CAPSULE | Freq: Two times a day (BID) | ORAL | 5 refills | Status: DC

## 2022-12-06 MED ORDER — CELECOXIB 200 MG PO CAPS: 200.0000 mg | ORAL_CAPSULE | Freq: Every day | ORAL | 3 refills | Status: DC

## 2022-12-06 MED ORDER — DICLOFENAC SODIUM 1 % EX GEL: 2.0000 g | Freq: Two times a day (BID) | CUTANEOUS | 11 refills | Status: DC

## 2022-12-06 NOTE — Patient Instructions (Addendum)
It was a pleasure seeing you today! Your health and satisfaction are our top priorities.  Glenetta Hew, MD  Your Providers PCP: Lula Olszewski, MD,  [phone]) Referring Provider: Lula Olszewski, MD,  [phone]) Care Team Provider: Vladimir Faster, DO,  [phone]) Care Team Provider: Waymon Budge, MD,  [phone]) Care Team Provider: Sharyon Cable Care Team Provider: Eugenia Mcalpine, MD,  [phone]) Care Team Provider: Park Liter, DPM Care Team Provider: Bernette Redbird, MD,  [phone]) Care Team Provider: Maris Berger, MD,  [phone]) Care Team Provider: Maris Berger, MD,  [phone]) Care Team Provider: Janalyn Harder, MD Care Team Provider: Glyn Ade, PA-C Care Team Provider: Jodelle Red, MD,  [phone]) Care Team Provider: Charlott Rakes, MD,  [phone]) Care Team Provider: Fuller Plan, MD,  [phone])  VISIT SUMMARY:  Dear Mr. Merilynn Finland, thank you for coming in to discuss your concerns about increasing fatigue and drowsiness, particularly after exertion. We also discussed your arthritis and neuropathy, which you manage with Voltaren cream, Tylenol Arthritis, and Lyrica. You expressed concern that Lyrica may be contributing to your drowsiness and were open to reducing or discontinuing this medication. We also discussed your use of Celebrex for arthritis and your willingness to reduce the dosage due to potential heart disease risk. You also take nifedipine for vascular issues and are open to increasing the dosage to improve blood flow.  YOUR PLAN:  -ARTHRITIS: Arthritis is inflammation of one or more of your joints that causes pain and stiffness. We will reduce your Celebrex dosage from 200mg  twice daily to 200mg  once daily while continuing Voltaren cream and Tylenol Arthritis 500mg  as needed.  -PERIPHERAL NEUROPATHY: Peripheral neuropathy is a result of damage to your peripheral nerves,  often causing weakness, numbness, and pain, usually in your hands and feet. We will reduce your Lyrica dosage from 200mg  twice daily to 100mg  twice daily and consider further reduction or discontinuation depending on your tolerance of neuropathic pain.  -POOR PERIPHERAL CIRCULATION: Poor peripheral circulation is when the blood vessels in your legs are narrowed or blocked, often causing leg pain and cramping. We will increase your Nifedipine dosage to improve blood flow and continue baby aspirin and Pletal.  -EXERTIONAL FATIGUE: Exertional fatigue is a feeling of tiredness or exhaustion that happens after physical activity. We will address this through the reduction of Lyrica and increase of Nifedipine as above and consider further evaluation if there is no improvement with medication adjustments.  INSTRUCTIONS:  Please follow the new medication plan as discussed. We will follow up in 1 month to assess your response to these medication adjustments. If you experience any side effects or if your symptoms worsen, please contact the office immediately.   NEXT STEPS: [x]  Early Intervention: Schedule sooner appointment, call our on-call services, or go to emergency room if there is any significant Increase in pain or discomfort New or worsening symptoms Sudden or severe changes in your health [x]  Flexible Follow-Up: We recommend a No follow-ups on file. for optimal routine care. This allows for progress monitoring and treatment adjustments. [x]  Preventive Care: Schedule your annual preventive care visit! It's typically covered by insurance and helps identify potential health issues early. [x]  Lab & X-ray Appointments: Incomplete tests scheduled today, or call to schedule. X-rays: [facility] (M-F, 8:30am-noon or 1pm-5pm). [x]  Medical Information Release: Sign a release form at front desk to obtain relevant medical information we don't have.  MAKING THE MOST OF OUR FOCUSED 20 MINUTE  APPOINTMENTS: [x]   Clearly state your top concerns at the beginning of the visit to focus our discussion [x]   If you anticipate you will need more time, please inform the front desk during scheduling - we can book multiple appointments in the same week. [x]   If you have transportation problems- use our convenient video appointments or ask about transportation support. [x]   We can get down to business faster if you use MyChart to update information before the visit and submit non-urgent questions before your visit. Thank you for taking the time to provide details through MyChart.  Let our nurse know and she can import this information into your encounter documents.  Arrival and Wait Times: [x]   Arriving on time ensures that everyone receives prompt attention. [x]   Early morning (8a) and afternoon (1p) appointments tend to have shortest wait times. [x]   Unfortunately, we cannot delay appointments for late arrivals or hold slots during phone calls.  Getting Answers and Following Up [x]   Simple Questions & Concerns: For quick questions or basic follow-up after your visit, reach [place] at [phone] or MyChart messaging. [x]   Complex Concerns: If your concern is more complex, scheduling an appointment might be best. Discuss this with the staff to find the most suitable option. [x]   Lab & Imaging Results: We'll contact you directly if results are abnormal or you don't use MyChart. Most normal results will be on MyChart within 2-3 business days, with a review message from Dr. Jon Billings. Haven't heard back in 2 weeks? Need results sooner? Contact us at [phone]. [x]   Referrals: Our referral coordinator will manage specialist referrals. The specialist's office should contact you within 2 weeks to schedule an appointment. Call us if you haven't heard from them after 2 weeks.  Staying Connected [x]   MyChart: Activate your MyChart for the fastest way to access results and message [place]. See the last page of  this paperwork for instructions on how to activate.  Bring to Your Next Appointment [x]   Medications: Please bring all your medication bottles to your next appointment to ensure we have an accurate record of your prescriptions. [x]   Health Diaries: If you're monitoring any health conditions at home, keeping a diary of your readings can be very helpful for discussions at your next appointment.  Billing [x]   X-ray & Lab Orders: These are billed by separate companies. Contact the invoicing company directly for questions or concerns. [x]   Visit Charges: Discuss any billing inquiries with our administrative services team.  Your Satisfaction Matters [x]   Share Your Experience: We strive for your satisfaction! If you have any complaints, or preferably compliments, please let Dr. Jon Billings know directly or contact our Practice Administrators, Edwena Felty or [employer], by asking at the front desk.   Reviewing Your Records [x]   Review this early draft of your clinical encounter notes below and the final encounter summary tomorrow on MyChart after its been completed.  All orders placed so far are visible here: Other fatigue  Primary osteoarthritis involving multiple joints -     Celecoxib; Take 1 capsule (200 mg total) by mouth daily. (Patient taking differently: Take 200 mg by mouth 2 (two) times daily.)  Dispense: 90 capsule; Refill: 3  Thromboangiitis obliterans (Buerger's disease) (HCC)  RUQ abdominal pain  PAD (peripheral artery disease) (HCC)  Raynaud's disease without gangrene -     NIFEdipine ER Osmotic Release; Take 1 tablet (60 mg total) by mouth daily. Dose increased to help improve blood flow and energy.  Dispense: 90 tablet;  Refill: 3  Neuropathy -     Pregabalin; Take 1 capsule (100 mg total) by mouth 2 (two) times daily. Dose reduced from 200 for mental sharpness improvement.  Ok to take just at night time to help sleep. Ok to stop completely if nerve pain is ok without.   Dispense: 60 capsule; Refill: 5

## 2022-12-06 NOTE — Progress Notes (Signed)
[facility]: [phone]   -- Medical Office Visit --  Patient:  Stephen Anderson      Age: 82 y.o.       Sex:  male  Date:   12/06/2022 Patient Care Team: Lula Olszewski, MD as PCP - General (Internal Medicine) Jodelle Red, MD as PCP - Cardiology (Cardiology) Tat, Octaviano Batty, DO as Consulting Physician (Neurology) Waymon Budge, MD as Consulting Physician (Pulmonary Disease) Sharyon Cable (Dentistry) Eugenia Mcalpine, MD (Inactive) as Consulting Physician (Orthopedic Surgery) Park Liter, DPM (Inactive) as Consulting Physician (Podiatry) Bernette Redbird, MD as Consulting Physician (Gastroenterology) Maris Berger, MD as Consulting Physician (Ophthalmology) Maris Berger, MD as Consulting Physician (Ophthalmology) Janalyn Harder, MD (Inactive) as Consulting Physician (Dermatology) Glyn Ade, PA-C as Physician Assistant (Dermatology) Charlott Rakes, MD as Consulting Physician (Gastroenterology) Fuller Plan, MD as Consulting Physician (Rheumatology) Today's Healthcare Provider: Lula Olszewski, MD     Assessment & Plan Other fatigue This is his main concern but it is very difficult to discern the cause of.  He reports chronic fatigue, particularly after exertion, which may be contributed to by Lyrica and poor peripheral circulation. We will address this through the reduction of Lyrica and increase of Nifedipine as above and consider further evaluation if there is no improvement with medication adjustments.  Suspicious for multifactorial etiology associated with complex medical history and medication(s) regimen.  I asked for a 1 month follow-up to try to see of these changes investigate further  Primary osteoarthritis involving multiple joints Severe arthritis affects his first metacarpophalangeal joint among others, currently managed with Voltaren cream and Tylenol Arthritis 500mg . He also uses Celebrex, but after  discussing potential cardiovascular risks with chronic use, we will reduce Celebrex from 200mg  twice daily to 200mg  once daily while continuing Voltaren cream and Tylenol Arthritis 500mg  as needed. Thromboangiitis obliterans (Buerger's disease) (HCC)  RUQ abdominal pain  PAD (peripheral artery disease) (HCC) He suffers from chronic leg pain and cramping, likely due to poor blood flow, currently managed with baby aspirin and Pletal. We will increase Nifedipine from the current dose to improve blood flow and continue baby aspirin and Pletal. Raynaud's disease without gangrene  Neuropathy  Other seborrheic dermatitis  Idiopathic peripheral neuropathy He experiences chronic neuropathic pain managed with Lyrica 200mg  twice daily. After discussing potential cognitive side effects and his desire to reduce drowsiness, we will reduce Lyrica from 200mg  twice daily to 100mg  twice daily and consider further reduction or discontinuation depending on his tolerance of neuropathic pain.  Recent blood work not necessary to repeat time Follow-up in 1 month to assess response to medication adjustments and follow up fatigue    Diagnoses and all orders for this visit: Other fatigue Primary osteoarthritis involving multiple joints -     celecoxib (CELEBREX) 200 MG capsule; Take 1 capsule (200 mg total) by mouth daily. (Patient taking differently: Take 200 mg by mouth 2 (two) times daily.) -     diclofenac Sodium (VOLTAREN) 1 % GEL; Apply 2 g topically in the morning and at bedtime. Every morning and every night. Thromboangiitis obliterans (Buerger's disease) (HCC) RUQ abdominal pain PAD (peripheral artery disease) (HCC) Raynaud's disease without gangrene -     NIFEdipine (PROCARDIA XL) 60 MG 24 hr tablet; Take 1 tablet (60 mg total) by mouth daily. Dose increased to help improve blood flow and energy. (Patient taking differently: Take 60 mg by mouth daily. Dose increased to help improve blood flow and energy.  Patient reported  taking 30 mg.) Neuropathy -     pregabalin (LYRICA) 100 MG capsule; Take 1 capsule (100 mg total) by mouth 2 (two) times daily. Dose reduced from 200 for mental sharpness improvement.  Ok to take just at night time to help sleep. Ok to stop completely if nerve pain is ok without. Other seborrheic dermatitis -     ketoconazole (NIZORAL) 2 % shampoo; Apply to affected area- let sit 3-5 minutes before rinsing  Recommended follow-up: No follow-ups on file. Future Appointments  Date Time Provider Department Center  01/08/2023  8:00 AM Lula Olszewski, MD [facility]  06/10/2023  9:00 AM Waymon Budge, MD [facility] None            Subjective   82 y.o. 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 His reasons/main concerns/chief complaints for today's office visit are 3 month follow-up   ------------------------------------------------------------------------------------------------------------------------ AI-Extracted: Discussed the use of AI scribe  software for clinical note transcription with the patient, who gave verbal consent to proceed.  History of Present Illness   Mr. Merilynn Finland, a patient with a history of arthritis and neuropathy, presents with concerns about increasing fatigue and drowsiness, particularly after exertion. This has been a persistent issue for several years and has been impacting his daily activities. The patient reports falling asleep frequently, even during the day, after periods of physical activity. He also mentions an incident of losing focus while driving, which he attributes to drowsiness.  The patient's arthritis is managed with Voltaren cream and Tylenol Arthritis 500 mg, which he reports as being effective. He also has substantial arthritis in his hands, as evidenced by recent imaging. The patient uses Voltaren cream daily for his hands, which he reports as being sore and difficult to bend. He denies any pain in the middle finger of his left hand but reports that his fingertips turn white in cold weather.  Regarding his neuropathy, the patient reports constant leg pain, which he manages with Lyrica (progabalin). He expresses concern that this medication may be contributing to his drowsiness and has expressed a willingness to reduce or discontinue this medication if it could alleviate his fatigue. He also reports numbness in his knees, which he attributes to neuropathy.  The patient is also on Celebrex for arthritis management but is willing to reduce the dosage due to concerns about potential heart disease risk. He also takes nifedipine (Procardia XL) for vascular issues, which he is open to increasing the dosage of to improve blood flow.  The patient is physically active, regularly attending the gym and engaging in strenuous activities like digging ditches. He reports that his legs feel better after exercise. He also uses a CPAP machine for sleep apnea. His wife has him on a regimen of vitamins C, D, and B,  which he  has been taking for a month.      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  Problem list overviews that were updated at today's visit: No problems updated. Current Outpatient Medications on File Prior to Visit  Medication Sig   Acetaminophen (TYLENOL ARTHRITIS EXT RELIEF PO) Take 1 tablet by mouth every other day.   Ascorbic Acid (VITAMIN C PO) Take 500 mg by mouth daily.   aspirin 81 MG tablet Take 81 mg by mouth every other day.    atorvastatin (LIPITOR) 40 MG tablet Take 1 tablet (40 mg total) by mouth at bedtime.   caffeine 200 MG TABS tablet Take 200 mg by mouth in the morning and at bedtime. "STAY AWAKE"   Cholecalciferol (VITAMIN D-3) 125 MCG (5000 UT) TABS Take 1 tablet by mouth daily.   cilostazol (PLETAL) 50 MG tablet Take 1 tablet (50 mg total) by mouth 2 (two) times daily.   Cyanocobalamin (B-12 PO) Take 1 tablet by mouth daily.   Ferrous Sulfate (IRON PO) Take 65 mg by mouth daily.   fluticasone (FLONASE) 50 MCG/ACT nasal spray Place 1 spray into both nostrils daily. (Patient taking differently: Place 1 spray into both nostrils daily. As needed.)   ipratropium (ATROVENT) 0.06 % nasal spray Place 1-2 sprays into both nostrils 4 (four) times daily. As needed for nasal congestion   MAGNESIUM PO Take 400 mg by mouth daily.   NON FORMULARY CPAP   pantoprazole (PROTONIX) 40 MG tablet Take 1 tablet (40 mg total) by mouth daily.   primidone  (MYSOLINE) 50 MG tablet TAKE ONE TABLET BY MOUTH EVERY MORNING AND ONE TABLET EVERY EVENING   UNABLE TO FIND 100 mg in the morning and at bedtime. Out back oil.   Clobetasol Propionate 0.05 % shampoo  (Patient not taking: Reported on 12/06/2022)   fluticasone (CUTIVATE) 0.05 % cream 1 application twice a day by topical route. (Patient not taking: Reported on 12/06/2022)   No current facility-administered medications on file prior to visit.   Medications Discontinued During This Encounter  Medication Reason   celecoxib (CELEBREX) 200 MG capsule Reorder   pregabalin (LYRICA) 200 MG capsule    NIFEdipine (PROCARDIA-XL/NIFEDICAL-XL) 30 MG 24 hr tablet Dose change   ketoconazole (NIZORAL) 2 % shampoo Reorder   diclofenac Sodium (VOLTAREN) 1 % GEL      Objective   Physical Exam  BP 116/64 (BP Location: Left Arm, Patient Position: Sitting)   Pulse 67   Temp 97.6 F (36.4 C) (Temporal)   Ht 5' 9.5" (1.765 m)   Wt 174 lb 12.8 oz (79.3 kg)   SpO2 98%   BMI 25.44 kg/m  Wt Readings from Last 10 Encounters:  12/06/22 174 lb 12.8 oz (79.3 kg)  10/19/22 176 lb 3.2 oz (79.9 kg)  09/24/22 176 lb (79.8 kg)  09/05/22 176 lb 6.4 oz (80 kg)  06/07/22 178 lb 9.6 oz (81 kg)  06/04/22 178 lb (80.7 kg)  05/29/22 177 lb (80.3 kg)  05/08/22 173 lb 9.6 oz (78.7 kg)  04/18/22 177 lb (80.3 kg)  04/05/22 170 lb (77.1 kg)   Vital signs reviewed.  Nursing notes reviewed. Weight trend reviewed. Abnormalities and Problem-Specific physical exam findings:  truncal adiposity. Very friendly gentleman. Very respectful.  General Appearance:  No acute distress appreciable.   Well-groomed, healthy-appearing male.  Well proportioned with no abnormal fat distribution.  Good muscle tone. Pulmonary:  Normal work of breathing at rest, no respiratory distress apparent. SpO2: 98 %  Musculoskeletal: All extremities are intact.  Neurological:  Awake, alert, oriented, and engaged.  No obvious focal neurological deficits or  cognitive impairments.  Sensorium seems unclouded.   Speech is clear and coherent with logical content. Psychiatric:  Appropriate mood, pleasant and cooperative demeanor, thoughtful and engaged during the exam  Results   LABS Vitamin D: Normal Vitamin B12: Normal  RADIOLOGY Hand X-ray: Substantial arthritis in the first metacarpophalangeal joint with cartilage loss and osteophyte formation        No results found for any visits on 12/06/22.  Office Visit on 09/24/2022  Component Date Value   Rheumatoid fact SerPl-aC* 09/24/2022 <10    Sed Rate 09/24/2022 2    Scleroderma (Scl-70) (EN* 09/24/2022 <1.0 NEG    Ribonucleic Protein(ENA)* 09/24/2022 <1.0 NEG    ENA SM Ab Ser-aCnc 09/24/2022 <1.0 NEG    SSA (Ro) (ENA) Antibody,* 09/24/2022 <1.0 NEG    ds DNA Ab 09/24/2022 1    C3 Complement 09/24/2022 124    C4 Complement 09/24/2022 21   Office Visit on 09/05/2022  Component Date Value   WBC 09/05/2022 6.9    RBC 09/05/2022 4.71    Hemoglobin 09/05/2022 14.1    HCT 09/05/2022 42.4    MCV 09/05/2022 90.2    MCHC 09/05/2022 33.2    RDW 09/05/2022 14.9    Platelets 09/05/2022 158.0    Neutrophils Relative % 09/05/2022 62.2    Lymphocytes Relative 09/05/2022 25.1    Monocytes Relative 09/05/2022 9.9    Eosinophils Relative 09/05/2022 2.2    Basophils Relative 09/05/2022 0.6    Neutro Abs 09/05/2022 4.3    Lymphs Abs 09/05/2022 1.7    Monocytes Absolute 09/05/2022 0.7    Eosinophils Absolute 09/05/2022 0.2    Basophils Absolute 09/05/2022 0.0    Ferritin 09/05/2022 29.4    Cholesterol 09/05/2022 123    Triglycerides 09/05/2022 160.0 (H)    HDL 09/05/2022 34.60 (L)    VLDL 09/05/2022 32.0    LDL Cholesterol 09/05/2022 57    Total CHOL/HDL Ratio 09/05/2022 4    NonHDL 09/05/2022 88.83    Sodium 09/05/2022 142    Potassium 09/05/2022 4.4    Chloride 09/05/2022 107    CO2 09/05/2022 28    Glucose, Bld 09/05/2022 101 (H)    BUN 09/05/2022 19    Creatinine, Ser 09/05/2022  1.17    Total Bilirubin 09/05/2022 0.4    Alkaline Phosphatase 09/05/2022 73    AST 09/05/2022 18    ALT 09/05/2022 15    Total Protein 09/05/2022 6.5    Albumin 09/05/2022 4.0    GFR 09/05/2022 59.00 (L)    Calcium 09/05/2022 8.6    VITD 09/05/2022 46.57    Microalb, Ur 09/05/2022 1.8    Creatinine,U 09/05/2022 97.2    Microalb Creat Ratio 09/05/2022 1.9    Magnesium 09/05/2022 1.9    Phosphorus 09/05/2022 2.1 (L)    Uric Acid, Serum 09/05/2022 5.7   Scanned Document on 06/20/2022  Component Date Value   [facility] Colonoscopy 06/20/2022 See Report (in chart)   Appointment on 06/04/2022  Component Date Value   Weight 06/04/2022 2,848  Height 06/04/2022 70    BP 06/04/2022 122/70    S' Lateral 06/04/2022 2.15    Area-P 1/2 06/04/2022 3.77    MV M vel 06/04/2022 3.23    MV Peak grad 06/04/2022 41.6    Est EF 06/04/2022 55 - 60%   Hospital Outpatient Visit on 05/20/2022  Component Date Value   Creatinine, Ser 05/20/2022 1.30 (H)   Office Visit on 05/08/2022  Component Date Value   WBC 05/08/2022 6.3    RBC 05/08/2022 4.85    Platelets 05/08/2022 171.0    Hemoglobin 05/08/2022 14.8    HCT 05/08/2022 43.7    MCV 05/08/2022 90.1    MCHC 05/08/2022 34.0    RDW 05/08/2022 14.6    Anti Nuclear Antibody (A* 05/08/2022 POSITIVE (A)    Anti-MPO Antibodies 05/08/2022 <0.2    Anti-PR3 Antibodies 05/08/2022 <0.2    C-ANCA 05/08/2022 <1:20    P-ANCA 05/08/2022 <1:20    Atypical pANCA 05/08/2022 <1:20    ANA Titer 1 05/08/2022 1:40 (H)    ANA Pattern 1 05/08/2022 Cytoplasmic (A)    ANA TITER 05/08/2022 1:80 (H)    ANA PATTERN 05/08/2022 Nuclear, Homogeneous (A)   Lab on 04/17/2022  Component Date Value   Fecal Occult Bld 04/17/2022 Negative   Lab on 04/05/2022  Component Date Value   PSA 04/05/2022 1.14   Office Visit on 03/06/2022  Component Date Value   Hgb A1c MFr Bld 03/06/2022 5.9    Sodium 03/06/2022 141    Potassium 03/06/2022 4.7    Chloride 03/06/2022 104    CO2  03/06/2022 30    Glucose, Bld 03/06/2022 94    BUN 03/06/2022 25 (H)    Creatinine, Ser 03/06/2022 1.32    GFR 03/06/2022 51.22 (L)    Calcium 03/06/2022 8.9    Vitamin B-12 03/06/2022 582    VITD 03/06/2022 52.49    WBC 03/06/2022 6.4    RBC 03/06/2022 4.79    Hemoglobin 03/06/2022 13.9    HCT 03/06/2022 42.6    MCV 03/06/2022 88.9    MCHC 03/06/2022 32.6    RDW 03/06/2022 16.2 (H)    Platelets 03/06/2022 177.0    Neutrophils Relative % 03/06/2022 57.8    Lymphocytes Relative 03/06/2022 29.5    Monocytes Relative 03/06/2022 9.4    Eosinophils Relative 03/06/2022 2.9    Basophils Relative 03/06/2022 0.4    Neutro Abs 03/06/2022 3.7    Lymphs Abs 03/06/2022 1.9    Monocytes Absolute 03/06/2022 0.6    Eosinophils Absolute 03/06/2022 0.2    Basophils Absolute 03/06/2022 0.0    Iron 03/06/2022 77    Transferrin 03/06/2022 248.0    Saturation Ratios 03/06/2022 22.2    Ferritin 03/06/2022 19.4 (L)    TIBC 03/06/2022 347.2    Cholesterol 03/06/2022 152    Triglycerides 03/06/2022 214.0 (H)    HDL 03/06/2022 38.00 (L)    VLDL 03/06/2022 42.8 (H)    Total CHOL/HDL Ratio 03/06/2022 4    NonHDL 03/06/2022 114.25    Direct LDL 03/06/2022 84.0    No image results found.   XR Hand 2 View Left  Result Date: 09/24/2022 X-ray left hand 2 views Radiocarpal and carpal joint spaces appear normal.  Subchondral cyst at head of the first metacarpal.  May be slight narrowing of third MCP joint space.  Very early osteophyte processes. PIPs normal. 2nd DIP with more advanced joint space loss and marginal osteophytes but no erosive changes. Bone mineralization otherwise normal. Impression Very mild degenerative  arthritis changes except more advanced at the second DIP joint  XR Hand 2 View Right  Result Date: 09/24/2022 X-ray right hand 2 views Radiocarpal joint space appears normal.  Few cystic changes present in the carpal bones minimal at first [facility] joint arthritis.  Small marginal osteophyte  formation at MCP joints with narrowing at the second and fourth MCPs and subchondral cyst worst at the fifth MCP.  Marginal osteophytes at PIP and DIP joints with slight asymmetric space narrowing at the DIPs.  No erosions or abnormal calcifications seen. Impression Mild appearing generalized degenerative changes, appears to be progression from 2022 xray with 2nd and 4th MCP narrowing   US Renal  Result Date: 05/27/2022 CLINICAL DATA:  Cyst EXAM: RENAL / URINARY TRACT ULTRASOUND COMPLETE COMPARISON:  04/26/2022 FINDINGS: Right Kidney: Length: 11.6 cm. Echogenicity within normal limits. No hydronephrosis. Midpole cyst measures 1.7 cm. This does not need follow up. Left Kidney: Length: 11.6 cm. Echogenicity within normal limits. No hydronephrosis. Lower pole cyst measures 1.9 cm. This does not need follow up. Bladder: Appears normal for degree of bladder distention. IMPRESSION: Small bilateral renal cysts.  Otherwise unremarkable study. Electronically Signed   By: Layla Maw M.D.   On: 05/27/2022 23:31   US ARTERIAL LOWER EXTREMITY DUPLEX BILATERAL  Result Date: 05/25/2022 CLINICAL DATA:  Peripheral arterial disease, Buerger's disease, Raynaud's disease EXAM: BILATERAL LOWER EXTREMITY ARTERIAL DUPLEX SCAN TECHNIQUE: Gray-scale sonography as well as color Doppler and duplex ultrasound was performed to evaluate the arteries of both lower extremities including the common, superficial and profunda femoral arteries, popliteal artery and calf arteries. COMPARISON:  None Available. FINDINGS: Right Lower Extremity ABI: 1.3 Inflow: Normal common femoral arterial waveforms and velocities. No evidence of inflow (aortoiliac) disease. Outflow: Normal profunda femoral, superficial femoral and popliteal arterial waveforms and velocities. No focal elevation of the PSV to suggest stenosis. Runoff: Normal posterior and anterior tibial arterial waveforms and velocities. Vessels are patent to the ankle. Left Lower Extremity  ABI: 1.3 Inflow: Normal common femoral arterial waveforms and velocities. No evidence of inflow (aortoiliac) disease. Outflow: Normal profunda femoral, superficial femoral and popliteal arterial waveforms and velocities. No focal elevation of the PSV to suggest stenosis. Runoff: Normal posterior and anterior tibial arterial waveforms and velocities. Vessels are patent to the ankle. IMPRESSION: Normal exam no evidence of hemodynamically significant peripheral arterial disease. Signed, Sterling Big, MD, RPVI [facility] Radiology Electronically Signed   By: Malachy Moan M.D.   On: 05/25/2022 05:50   US ARTERIAL ABI (SCREENING LOWER EXTREMITY)  Result Date: 05/25/2022 CLINICAL DATA:  Peripheral arterial disease, Buerger's disease, Raynaud's disease EXAM: BILATERAL LOWER EXTREMITY ARTERIAL DUPLEX SCAN TECHNIQUE: Gray-scale sonography as well as color Doppler and duplex ultrasound was performed to evaluate the arteries of both lower extremities including the common, superficial and profunda femoral arteries, popliteal artery and calf arteries. COMPARISON:  None Available. FINDINGS: Right Lower Extremity ABI: 1.3 Inflow: Normal common femoral arterial waveforms and velocities. No evidence of inflow (aortoiliac) disease. Outflow: Normal profunda femoral, superficial femoral and popliteal arterial waveforms and velocities. No focal elevation of the PSV to suggest stenosis. Runoff: Normal posterior and anterior tibial arterial waveforms and velocities. Vessels are patent to the ankle. Left Lower Extremity ABI: 1.3 Inflow: Normal common femoral arterial waveforms and velocities. No evidence of inflow (aortoiliac) disease. Outflow: Normal profunda femoral, superficial femoral and popliteal arterial waveforms and velocities. No focal elevation of the PSV to suggest stenosis. Runoff: Normal posterior and anterior tibial  arterial waveforms and velocities. Vessels  are patent to the ankle. IMPRESSION: Normal exam no evidence of hemodynamically significant peripheral arterial disease. Signed, Sterling Big, MD, RPVI [facility] Radiology Electronically Signed   By: Malachy Moan M.D.   On: 05/25/2022 05:50       Additional Info: This encounter employed real-time, collaborative documentation. The patient actively reviewed and updated their medical record on a shared screen, ensuring transparency and facilitating joint problem-solving for the problem list, overview, and plan. This approach promotes accurate, informed care. The treatment plan was discussed and reviewed in detail, including medication safety, potential side effects, and all patient questions. We confirmed understanding and comfort with the plan. Follow-up instructions were established, including contacting the office for any concerns, returning if symptoms worsen, persist, or new symptoms develop, and precautions for potential emergency department visits.

## 2022-12-06 NOTE — Assessment & Plan Note (Signed)
Severe arthritis affects his first metacarpophalangeal joint among others, currently managed with Voltaren cream and Tylenol Arthritis 500mg . He also uses Celebrex, but after discussing potential cardiovascular risks with chronic use, we will reduce Celebrex from 200mg  twice daily to 200mg  once daily while continuing Voltaren cream and Tylenol Arthritis 500mg  as needed.

## 2022-12-06 NOTE — Assessment & Plan Note (Signed)
He experiences chronic neuropathic pain managed with Lyrica 200mg  twice daily. After discussing potential cognitive side effects and his desire to reduce drowsiness, we will reduce Lyrica from 200mg  twice daily to 100mg  twice daily and consider further reduction or discontinuation depending on his tolerance of neuropathic pain.

## 2022-12-22 ENCOUNTER — Other Ambulatory Visit: Payer: Self-pay | Admitting: Internal Medicine

## 2022-12-22 DIAGNOSIS — I731 Thromboangiitis obliterans [Buerger's disease]: Secondary | ICD-10-CM

## 2023-01-08 ENCOUNTER — Ambulatory Visit: Payer: HMO | Admitting: Internal Medicine

## 2023-01-16 ENCOUNTER — Encounter: Payer: Self-pay | Admitting: Internal Medicine

## 2023-01-16 ENCOUNTER — Ambulatory Visit (INDEPENDENT_AMBULATORY_CARE_PROVIDER_SITE_OTHER): Payer: HMO | Admitting: Internal Medicine

## 2023-01-16 VITALS — BP 135/78 | HR 61 | Temp 98.8°F | Ht 69.5 in | Wt 173.0 lb

## 2023-01-16 DIAGNOSIS — G629 Polyneuropathy, unspecified: Secondary | ICD-10-CM

## 2023-01-16 DIAGNOSIS — G4733 Obstructive sleep apnea (adult) (pediatric): Secondary | ICD-10-CM | POA: Diagnosis not present

## 2023-01-16 DIAGNOSIS — I731 Thromboangiitis obliterans [Buerger's disease]: Secondary | ICD-10-CM | POA: Diagnosis not present

## 2023-01-16 DIAGNOSIS — E785 Hyperlipidemia, unspecified: Secondary | ICD-10-CM | POA: Diagnosis not present

## 2023-01-16 DIAGNOSIS — K449 Diaphragmatic hernia without obstruction or gangrene: Secondary | ICD-10-CM

## 2023-01-16 DIAGNOSIS — M150 Primary generalized (osteo)arthritis: Secondary | ICD-10-CM | POA: Diagnosis not present

## 2023-01-16 DIAGNOSIS — R1011 Right upper quadrant pain: Secondary | ICD-10-CM

## 2023-01-16 DIAGNOSIS — E782 Mixed hyperlipidemia: Secondary | ICD-10-CM | POA: Diagnosis not present

## 2023-01-16 DIAGNOSIS — E781 Pure hyperglyceridemia: Secondary | ICD-10-CM

## 2023-01-16 DIAGNOSIS — I700 Atherosclerosis of aorta: Secondary | ICD-10-CM | POA: Diagnosis not present

## 2023-01-16 DIAGNOSIS — R634 Abnormal weight loss: Secondary | ICD-10-CM

## 2023-01-16 DIAGNOSIS — N4289 Other specified disorders of prostate: Secondary | ICD-10-CM

## 2023-01-16 MED ORDER — PREGABALIN 100 MG PO CAPS: 100.0000 mg | ORAL_CAPSULE | Freq: Every day | ORAL | 0 refills | Status: DC

## 2023-01-16 MED ORDER — OMEGA-3-ACID ETHYL ESTERS 1 G PO CAPS
2.0000 g | ORAL_CAPSULE | Freq: Two times a day (BID) | ORAL | 3 refills | Status: DC
Start: 2023-01-16 — End: 2023-10-07

## 2023-01-16 MED ORDER — CELECOXIB 200 MG PO CAPS
200.0000 mg | ORAL_CAPSULE | Freq: Every day | ORAL | 3 refills | Status: DC
Start: 2023-01-16 — End: 2023-10-07

## 2023-01-16 NOTE — Progress Notes (Signed)
[facility]: [phone]   -- Medical Office Visit --  Patient:  Stephen Anderson      Age: 82 y.o.       Sex:  male  Date:   01/16/2023 Patient Care Team: Lula Olszewski, MD as PCP - General (Internal Medicine) Jodelle Red, MD as PCP - Cardiology (Cardiology) Tat, Octaviano Batty, DO as Consulting Physician (Neurology) Waymon Budge, MD as Consulting Physician (Pulmonary Disease) Sharyon Cable (Dentistry) Eugenia Mcalpine, MD (Inactive) as Consulting Physician (Orthopedic Surgery) Park Liter, DPM (Inactive) as Consulting Physician (Podiatry) Bernette Redbird, MD as Consulting Physician (Gastroenterology) Maris Berger, MD as Consulting Physician (Ophthalmology) Maris Berger, MD as Consulting Physician (Ophthalmology) Janalyn Harder, MD (Inactive) as Consulting Physician (Dermatology) Glyn Ade, PA-C as Physician Assistant (Dermatology) Charlott Rakes, MD as Consulting Physician (Gastroenterology) Fuller Plan, MD as Consulting Physician (Rheumatology) Today's Healthcare Provider: Lula Olszewski, MD      Assessment & Plan Neuropathy His peripheral neuropathy has improved with the current regimen of Lyrica 100mg  at night, though he reports restlessness and discomfort when a dose is missed. We will continue Lyrica 100mg  at night. Primary osteoarthritis involving multiple joints His chronic back pain persists despite the current regimen, with Voltaren cream used for symptomatic relief. We will continue the current management. Aortic atherosclerosis (HCC) Start fish oil Continue aspirin atorvastatin Cholesterol at goal ldl Dyslipidemia He has elevated triglycerides and low HDL. We discussed the benefits of fish oil for improving his lipid profile and softening arteries. We will start fish oil, 2 pills twice a day, and check cholesterol in 3 months. Hypertriglyceridemia He has elevated triglycerides and low HDL. We  discussed the benefits of fish oil for improving his lipid profile and softening arteries. We will start fish oil, 2 pills twice a day, and check cholesterol in 3 months. Hiatal hernia He experiences occasional discomfort with spicy food, managed with antacids as needed. We will continue the current management. No need for intervention at this time Mixed hyperlipidemia  Thromboangiitis obliterans (Buerger's disease) (HCC)  RUQ abdominal pain His right upper quadrant abdominal pain has improved since the last visit, with occasional discomfort reported. He should return sooner if pain worsens and consider a CT scan if symptoms persist. Weight loss  Calcification of prostate  Unintentional weight loss Its not much but it warrants monitoring at his age. He reports unintentional weight loss without other concerning symptoms. We will check his weight in 2-3 months. Right upper quadrant abdominal pain His right upper quadrant abdominal pain has improved since the last visit, with occasional discomfort reported. He should return sooner if pain worsens and consider a CT scan if symptoms persist. OSA on CPAP His sleep apnea is well controlled with CPAP use. We will continue CPAP use.  We will continue current medications as prescribed and return in 3 months for follow-up and cholesterol check.      Diagnoses and all orders for this visit: Aortic atherosclerosis (HCC) -     omega-3 acid ethyl esters (LOVAZA) 1 g capsule; Take 2 capsules (2 g total) by mouth 2 (two) times daily. Neuropathy -     pregabalin (LYRICA) 100 MG capsule; Take 1 capsule (100 mg total) by mouth daily. Fill when due Primary osteoarthritis involving multiple joints -     celecoxib (CELEBREX) 200 MG capsule; Take 1 capsule (200 mg total) by mouth daily. Dyslipidemia -     omega-3 acid ethyl esters (LOVAZA) 1 g capsule; Take 2 capsules (  2 g total) by mouth 2 (two) times daily. Hypertriglyceridemia -     omega-3 acid ethyl  esters (LOVAZA) 1 g capsule; Take 2 capsules (2 g total) by mouth 2 (two) times daily. Hiatal hernia Mixed hyperlipidemia Thromboangiitis obliterans (Buerger's disease) (HCC) RUQ abdominal pain Weight loss Calcification of prostate  Recommended follow-up: No follow-ups on file. Future Appointments  Date Time Provider Department Center  03/27/2023  8:00 AM Lula Olszewski, MD [facility]  06/10/2023  9:00 AM Waymon Budge, MD [facility] None            Subjective   82 y.o. 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 His reasons/main concerns/chief complaints for today's office visit are 1 month follow-up   ------------------------------------------------------------------------------------------------------------------------ AI-Extracted: Discussed the use of AI scribe software for clinical note transcription with the patient, who gave verbal consent to proceed.  History of Present Illness   The patient, with a history of neuropathy, reports significant  improvement in symptoms with the current medication regimen. He notes that if he forgets to take his medication at night, he experiences restlessness and difficulty sleeping. The patient was previously on a twice-daily regimen of 100mg  of an unidentified medication, but this was reduced to a nightly dose due to excessive daytime drowsiness.  The patient also reports a history of right upper quadrant abdominal pain, which has significantly improved and is no longer a prominent issue. He mentions occasional recurrence of the pain, particularly when overeating or consuming spicy foods.  The patient has a history of Raynaud's disease, for which he is on a 60mg  dose of nifedipine. He reports an improvement in symptoms, with less frequent episodes of white discoloration in his fingertips.  The patient also has a history of back pain, which he manages with a topical cream. He notes that the pain is particularly bothersome in the morning but tends to improve as the day progresses.  The patient has been experiencing unintentional weight loss, which he attributes to a reduced meal frequency, consuming only two main meals a day with a snack bar for lunch. He also reports frequent bowel movements in the morning, particularly after consuming certain foods.  The patient has a history of sleep apnea and uses a CPAP machine consistently, which he believes has significantly improved his sleep quality. He also uses a dry mouth mouthwash to manage the dry mouth caused by the CPAP machine.  The patient has a history of high cholesterol and hardening of the arteries. He has been advised to increase his intake of fish or take fish oil supplements to manage this. He also has a history of hiatal hernia, which causes discomfort when he consumes spicy foods.      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  Problem list overviews that were updated at today's visit: Problem  Hiatal Hernia   Can't eat spicy   Weight Loss   Wt Readings from Last 10 Encounters:  01/16/23 173 lb (78.5 kg)  12/06/22 174 lb 12.8 oz (79.3 kg)  10/19/22 176 lb 3.2 oz (79.9 kg)  09/24/22 176 lb (79.8 kg)  09/05/22 176 lb 6.4 oz (80 kg)  06/07/22 178 lb 9.6 oz (81 kg)  06/04/22 178 lb (80.7 kg)  05/29/22 177 lb (80.3 kg)  05/08/22 173 lb 9.6 oz (78.7 kg)  04/18/22 177 lb (80.3 kg)      Aortic Atherosclerosis (Hcc)   05/2022 CT Medications: not yet discussed Lab Results  Component Value Date   HDL 34.60 (L) 09/05/2022   HDL 38.00 (L) 03/06/2022   HDL 45.60 06/27/2021   CHOLHDL 4 09/05/2022   CHOLHDL 4 03/06/2022   CHOLHDL 3 06/27/2021   Lab Results  Component Value Date   LDLCALC 57 09/05/2022   LDLCALC 64 06/27/2021   LDLCALC 85 03/26/2018   LDLDIRECT 84.0 03/06/2022   LDLDIRECT 93.0 05/24/2020   LDLDIRECT 96.0 05/21/2019   Lab Results  Component Value Date   TRIG 160.0 (H) 09/05/2022   TRIG 214.0 (H) 03/06/2022   TRIG 199.0 (H) 06/27/2021   Lab Results  Component Value Date   CHOL 123 09/05/2022   CHOL 152 03/06/2022   CHOL 149 06/27/2021   The ASCVD Risk score (Arnett DK, et al., 2019) failed to calculate for the following reasons:   The 2019 ASCVD risk score is only valid for ages 26 to 71 Lab Results  Component Value Date   ALT 15 09/05/2022   AST 18 09/05/2022   ALKPHOS 73 09/05/2022   TSH 1.43 10/26/2021   HGBA1C 5.9 03/06/2022   Body mass index is 25.18  kg/m.  Lipoprotein(a), Apolipoprotein B (ApoB), and High-sensitivity C-reactive protein (hs-CRP) No results found for: "HSCRP", "LIPOA" Improving Your Cholesterol: Diet: Focus on a Mediterranean-style diet, limit saturated fats and sugars, and increase omega-3 fatty acids (fish, flaxseeds,nuts,extra virgin olive oil, avocados). Exercise: Engage in regular physical activity (aerobic exercises are particularly beneficial for HDL). Weight Management: Maintain a healthy weight. Smoking Cessation: Quitting smoking improves cholesterol levels.    Calcification of Prostate   Lab Results  Component Value Date   PSA 1.14 04/05/2022   PSA 1.14 05/24/2020   PSA 0.97 05/21/2019       Thromboangiitis Obliterans (Buerger's Disease) (Hcc)   Used to smoke, cold weather ice over fingertips especially index and middle on left hand. Requires nifedipine to manage   Ruq Abdominal Pain   We did ultrasound abdomen 04/26/22 to evaluate pain he has where it feels like right upper quadrant of abdomen pain flared by leaning forward feels like liver tip rolling over but that was negative Hasn't felt it often since.   Mixed Hyperlipidemia   Lipid Panel     Component Value Date/Time   CHOL 123 09/05/2022 0851   TRIG 160.0 (H) 09/05/2022 0851   HDL 34.60 (L) 09/05/2022 0851   CHOLHDL 4 09/05/2022 0851   VLDL 32.0 09/05/2022 0851   LDLCALC 57 09/05/2022 9604  LDLDIRECT 84.0 03/06/2022 0844       Current Outpatient Medications on File Prior to Visit  Medication Sig   Acetaminophen (TYLENOL ARTHRITIS EXT RELIEF PO) Take 1 tablet by mouth every other day.   Ascorbic Acid (VITAMIN C PO) Take 500 mg by mouth daily.   aspirin 81 MG tablet Take 81 mg by mouth every other day.    atorvastatin (LIPITOR) 40 MG tablet Take 1 tablet (40 mg total) by mouth at bedtime.   caffeine 200 MG TABS tablet Take 200 mg by mouth in the morning and at bedtime. "STAY AWAKE"   Cholecalciferol (VITAMIN D-3) 125 MCG (5000 UT)  TABS Take 1 tablet by mouth daily.   cilostazol (PLETAL) 50 MG tablet TAKE 1 TABLET BY MOUTH TWICE A DAY   Clobetasol Propionate 0.05 % shampoo    Cyanocobalamin (B-12 PO) Take 1 tablet by mouth daily.   diclofenac Sodium (VOLTAREN) 1 % GEL Apply 2 g topically in the morning and at bedtime. Every morning and every night.   Ferrous Sulfate (IRON PO) Take 65 mg by mouth daily.   fluticasone (CUTIVATE) 0.05 % cream    fluticasone (FLONASE) 50 MCG/ACT nasal spray Place 1 spray into both nostrils daily. (Patient taking differently: Place 1 spray into both nostrils daily. As needed.)   ipratropium (ATROVENT) 0.06 % nasal spray Place 1-2 sprays into both nostrils 4 (four) times daily. As needed for nasal congestion   ketoconazole (NIZORAL) 2 % shampoo Apply to affected area- let sit 3-5 minutes before rinsing   MAGNESIUM PO Take 400 mg by mouth daily.   NIFEdipine (PROCARDIA XL) 60 MG 24 hr tablet Take 1 tablet (60 mg total) by mouth daily. Dose increased to help improve blood flow and energy. (Patient taking differently: Take 60 mg by mouth daily. Dose increased to help improve blood flow and energy. Patient reported taking 30 mg.)   NON FORMULARY CPAP   pantoprazole (PROTONIX) 40 MG tablet Take 1 tablet (40 mg total) by mouth daily.   primidone (MYSOLINE) 50 MG tablet TAKE ONE TABLET BY MOUTH EVERY MORNING AND ONE TABLET EVERY EVENING   UNABLE TO FIND 100 mg in the morning and at bedtime. Out back oil.   No current facility-administered medications on file prior to visit.   Medications Discontinued During This Encounter  Medication Reason   celecoxib (CELEBREX) 200 MG capsule Reorder   pregabalin (LYRICA) 100 MG capsule Reorder     Objective   Physical Exam  BP 135/78   Pulse 61   Temp 98.8 F (37.1 C) (Temporal)   Ht 5' 9.5" (1.765 m)   Wt 173 lb (78.5 kg)   SpO2 97%   BMI 25.18 kg/m  Wt Readings from Last 10 Encounters:  01/16/23 173 lb (78.5 kg)  12/06/22 174 lb 12.8 oz (79.3 kg)   10/19/22 176 lb 3.2 oz (79.9 kg)  09/24/22 176 lb (79.8 kg)  09/05/22 176 lb 6.4 oz (80 kg)  06/07/22 178 lb 9.6 oz (81 kg)  06/04/22 178 lb (80.7 kg)  05/29/22 177 lb (80.3 kg)  05/08/22 173 lb 9.6 oz (78.7 kg)  04/18/22 177 lb (80.3 kg)   Vital signs reviewed.  Nursing notes reviewed. Weight trend reviewed. Abnormalities and Problem-Specific physical exam findings:  appears well truncal adiposity noted  General Appearance:  No acute distress appreciable.   Well-groomed, healthy-appearing male.  Well proportioned with no abnormal fat distribution.  Good muscle tone. Pulmonary:  Normal work of breathing at rest, no  respiratory distress apparent. SpO2: 97 %  Musculoskeletal: All extremities are intact.  Neurological:  Awake, alert, oriented, and engaged.  No obvious focal neurological deficits or cognitive impairments.  Sensorium seems unclouded.   Speech is clear and coherent with logical content. Psychiatric:  Appropriate mood, pleasant and cooperative demeanor, thoughtful and engaged during the exam  Results   LABS Cholesterol: Low HDL, high triglycerides (May 2024) Blood work: Otherwise normal (May 2024)        No results found for any visits on 01/16/23.  Office Visit on 09/24/2022  Component Date Value   Rheumatoid fact SerPl-aC* 09/24/2022 <10    Sed Rate 09/24/2022 2    Scleroderma (Scl-70) (EN* 09/24/2022 <1.0 NEG    Ribonucleic Protein(ENA)* 09/24/2022 <1.0 NEG    ENA SM Ab Ser-aCnc 09/24/2022 <1.0 NEG    SSA (Ro) (ENA) Antibody,* 09/24/2022 <1.0 NEG    ds DNA Ab 09/24/2022 1    C3 Complement 09/24/2022 124    C4 Complement 09/24/2022 21   Office Visit on 09/05/2022  Component Date Value   WBC 09/05/2022 6.9    RBC 09/05/2022 4.71    Hemoglobin 09/05/2022 14.1    HCT 09/05/2022 42.4    MCV 09/05/2022 90.2    MCHC 09/05/2022 33.2    RDW 09/05/2022 14.9    Platelets 09/05/2022 158.0    Neutrophils Relative % 09/05/2022 62.2    Lymphocytes Relative 09/05/2022  25.1    Monocytes Relative 09/05/2022 9.9    Eosinophils Relative 09/05/2022 2.2    Basophils Relative 09/05/2022 0.6    Neutro Abs 09/05/2022 4.3    Lymphs Abs 09/05/2022 1.7    Monocytes Absolute 09/05/2022 0.7    Eosinophils Absolute 09/05/2022 0.2    Basophils Absolute 09/05/2022 0.0    Ferritin 09/05/2022 29.4    Cholesterol 09/05/2022 123    Triglycerides 09/05/2022 160.0 (H)    HDL 09/05/2022 34.60 (L)    VLDL 09/05/2022 32.0    LDL Cholesterol 09/05/2022 57    Total CHOL/HDL Ratio 09/05/2022 4    NonHDL 09/05/2022 88.83    Sodium 09/05/2022 142    Potassium 09/05/2022 4.4    Chloride 09/05/2022 107    CO2 09/05/2022 28    Glucose, Bld 09/05/2022 101 (H)    BUN 09/05/2022 19    Creatinine, Ser 09/05/2022 1.17    Total Bilirubin 09/05/2022 0.4    Alkaline Phosphatase 09/05/2022 73    AST 09/05/2022 18    ALT 09/05/2022 15    Total Protein 09/05/2022 6.5    Albumin 09/05/2022 4.0    GFR 09/05/2022 59.00 (L)    Calcium 09/05/2022 8.6    VITD 09/05/2022 46.57    Microalb, Ur 09/05/2022 1.8    Creatinine,U 09/05/2022 97.2    Microalb Creat Ratio 09/05/2022 1.9    Magnesium 09/05/2022 1.9    Phosphorus 09/05/2022 2.1 (L)    Uric Acid, Serum 09/05/2022 5.7   Scanned Document on 06/20/2022  Component Date Value   [facility] Colonoscopy 06/20/2022 See Report (in chart)   Appointment on 06/04/2022  Component Date Value   Weight 06/04/2022 2,848    Height 06/04/2022 70    BP 06/04/2022 122/70    S' Lateral 06/04/2022 2.15    Area-P 1/2 06/04/2022 3.77    MV M vel 06/04/2022 3.23    MV Peak grad 06/04/2022 41.6    Est EF 06/04/2022 55 - 60%   Hospital Outpatient Visit on 05/20/2022  Component Date Value   Creatinine, Ser 05/20/2022 1.30 (  H)   Office Visit on 05/08/2022  Component Date Value   WBC 05/08/2022 6.3    RBC 05/08/2022 4.85    Platelets 05/08/2022 171.0    Hemoglobin 05/08/2022 14.8    HCT 05/08/2022 43.7    MCV 05/08/2022 90.1    MCHC 05/08/2022 34.0     RDW 05/08/2022 14.6    Anti Nuclear Antibody (A* 05/08/2022 POSITIVE (A)    Anti-MPO Antibodies 05/08/2022 <0.2    Anti-PR3 Antibodies 05/08/2022 <0.2    C-ANCA 05/08/2022 <1:20    P-ANCA 05/08/2022 <1:20    Atypical pANCA 05/08/2022 <1:20    ANA Titer 1 05/08/2022 1:40 (H)    ANA Pattern 1 05/08/2022 Cytoplasmic (A)    ANA TITER 05/08/2022 1:80 (H)    ANA PATTERN 05/08/2022 Nuclear, Homogeneous (A)   Lab on 04/17/2022  Component Date Value   Fecal Occult Bld 04/17/2022 Negative   Lab on 04/05/2022  Component Date Value   PSA 04/05/2022 1.14   Office Visit on 03/06/2022  Component Date Value   Hgb A1c MFr Bld 03/06/2022 5.9    Sodium 03/06/2022 141    Potassium 03/06/2022 4.7    Chloride 03/06/2022 104    CO2 03/06/2022 30    Glucose, Bld 03/06/2022 94    BUN 03/06/2022 25 (H)    Creatinine, Ser 03/06/2022 1.32    GFR 03/06/2022 51.22 (L)    Calcium 03/06/2022 8.9    Vitamin B-12 03/06/2022 582    VITD 03/06/2022 52.49    WBC 03/06/2022 6.4    RBC 03/06/2022 4.79    Hemoglobin 03/06/2022 13.9    HCT 03/06/2022 42.6    MCV 03/06/2022 88.9    MCHC 03/06/2022 32.6    RDW 03/06/2022 16.2 (H)    Platelets 03/06/2022 177.0    Neutrophils Relative % 03/06/2022 57.8    Lymphocytes Relative 03/06/2022 29.5    Monocytes Relative 03/06/2022 9.4    Eosinophils Relative 03/06/2022 2.9    Basophils Relative 03/06/2022 0.4    Neutro Abs 03/06/2022 3.7    Lymphs Abs 03/06/2022 1.9    Monocytes Absolute 03/06/2022 0.6    Eosinophils Absolute 03/06/2022 0.2    Basophils Absolute 03/06/2022 0.0    Iron 03/06/2022 77    Transferrin 03/06/2022 248.0    Saturation Ratios 03/06/2022 22.2    Ferritin 03/06/2022 19.4 (L)    TIBC 03/06/2022 347.2    Cholesterol 03/06/2022 152    Triglycerides 03/06/2022 214.0 (H)    HDL 03/06/2022 38.00 (L)    VLDL 03/06/2022 42.8 (H)    Total CHOL/HDL Ratio 03/06/2022 4    NonHDL 03/06/2022 114.25    Direct LDL 03/06/2022 84.0    No image results  found.   No results found.  US Renal  Result Date: 05/27/2022 CLINICAL DATA:  Cyst EXAM: RENAL / URINARY TRACT ULTRASOUND COMPLETE COMPARISON:  04/26/2022 FINDINGS: Right Kidney: Length: 11.6 cm. Echogenicity within normal limits. No hydronephrosis. Midpole cyst measures 1.7 cm. This does not need follow up. Left Kidney: Length: 11.6 cm. Echogenicity within normal limits. No hydronephrosis. Lower pole cyst measures 1.9 cm. This does not need follow up. Bladder: Appears normal for degree of bladder distention. IMPRESSION: Small bilateral renal cysts.  Otherwise unremarkable study. Electronically Signed   By: Layla Maw M.D.   On: 05/27/2022 23:31   US ARTERIAL LOWER EXTREMITY DUPLEX BILATERAL  Result Date: 05/25/2022 CLINICAL DATA:  Peripheral arterial disease, Buerger's disease, Raynaud's disease EXAM: BILATERAL LOWER EXTREMITY ARTERIAL DUPLEX SCAN TECHNIQUE: Gray-scale sonography as  well as color Doppler and duplex ultrasound was performed to evaluate the arteries of both lower extremities including the common, superficial and profunda femoral arteries, popliteal artery and calf arteries. COMPARISON:  None Available. FINDINGS: Right Lower Extremity ABI: 1.3 Inflow: Normal common femoral arterial waveforms and velocities. No evidence of inflow (aortoiliac) disease. Outflow: Normal profunda femoral, superficial femoral and popliteal arterial waveforms and velocities. No focal elevation of the PSV to suggest stenosis. Runoff: Normal posterior and anterior tibial arterial waveforms and velocities. Vessels are patent to the ankle. Left Lower Extremity ABI: 1.3 Inflow: Normal common femoral arterial waveforms and velocities. No evidence of inflow (aortoiliac) disease. Outflow: Normal profunda femoral, superficial femoral and popliteal arterial waveforms and velocities. No focal elevation of the PSV to suggest stenosis. Runoff: Normal posterior and anterior tibial arterial waveforms and velocities. Vessels  are patent to the ankle. IMPRESSION: Normal exam no evidence of hemodynamically significant peripheral arterial disease. Signed, Sterling Big, MD, RPVI [facility] Radiology Electronically Signed   By: Malachy Moan M.D.   On: 05/25/2022 05:50   US ARTERIAL ABI (SCREENING LOWER EXTREMITY)  Result Date: 05/25/2022 CLINICAL DATA:  Peripheral arterial disease, Buerger's disease, Raynaud's disease EXAM: BILATERAL LOWER EXTREMITY ARTERIAL DUPLEX SCAN TECHNIQUE: Gray-scale sonography as well as color Doppler and duplex ultrasound was performed to evaluate the arteries of both lower extremities including the common, superficial and profunda femoral arteries, popliteal artery and calf arteries. COMPARISON:  None Available. FINDINGS: Right Lower Extremity ABI: 1.3 Inflow: Normal common femoral arterial waveforms and velocities. No evidence of inflow (aortoiliac) disease. Outflow: Normal profunda femoral, superficial femoral and popliteal arterial waveforms and velocities. No focal elevation of the PSV to suggest stenosis. Runoff: Normal posterior and anterior tibial arterial waveforms and velocities. Vessels are patent to the ankle. Left Lower Extremity ABI: 1.3 Inflow: Normal common femoral arterial waveforms and velocities. No evidence of inflow (aortoiliac) disease. Outflow: Normal profunda femoral, superficial femoral and popliteal arterial waveforms and velocities. No focal elevation of the PSV to suggest stenosis. Runoff: Normal posterior and anterior tibial arterial waveforms and velocities. Vessels are patent to the ankle. IMPRESSION: Normal exam no evidence of hemodynamically significant peripheral arterial disease. Signed, Sterling Big, MD, RPVI [facility] Radiology Electronically Signed   By: Malachy Moan M.D.   On: 05/25/2022 05:50       Additional Info: This encounter employed  real-time, collaborative documentation. The patient actively reviewed and updated their medical record on a shared screen, ensuring transparency and facilitating joint problem-solving for the problem list, overview, and plan. This approach promotes accurate, informed care. The treatment plan was discussed and reviewed in detail, including medication safety, potential side effects, and all patient questions. We confirmed understanding and comfort with the plan. Follow-up instructions were established, including contacting the office for any concerns, returning if symptoms worsen, persist, or new symptoms develop, and precautions for potential emergency department visits.

## 2023-01-16 NOTE — Assessment & Plan Note (Addendum)
Start fish oil Continue aspirin atorvastatin Cholesterol at goal ldl

## 2023-01-16 NOTE — Assessment & Plan Note (Signed)
His chronic back pain persists despite the current regimen, with Voltaren cream used for symptomatic relief. We will continue the current management.

## 2023-01-16 NOTE — Assessment & Plan Note (Signed)
He experiences occasional discomfort with spicy food, managed with antacids as needed. We will continue the current management. No need for intervention at this time

## 2023-01-16 NOTE — Patient Instructions (Signed)
VISIT SUMMARY:  During your visit, we discussed your neuropathy, high cholesterol, chronic back pain, hiatal hernia, unintentional weight loss, right upper quadrant abdominal pain, and sleep apnea. You've reported improvements in your neuropathy, abdominal pain, and sleep apnea with your current treatments. We've decided to continue your current treatments for these conditions. We also discussed your high cholesterol and decided to start you on fish oil to help improve your lipid profile. Your back pain and hiatal hernia will continue to be managed as they are currently. We will monitor your unintentional weight loss and check your weight in 2-3 months.  YOUR PLAN:  -PERIPHERAL NEUROPATHY: Peripheral neuropathy is a condition that affects the nerves in your hands and feet, causing pain and numbness. We will continue your current medication, Lyrica 100mg  at night, to manage this.  -HYPERLIPIDEMIA: Hyperlipidemia is a condition where you have high levels of fats (lipids) in your blood. To help manage this, we will start you on fish oil, taking 2 pills twice a day, and we will check your cholesterol in 3 months.  -CHRONIC BACK PAIN: Chronic back pain is long-term pain in your back. We will continue to manage this with Voltaren cream for symptomatic relief.  -HIATAL HERNIA: A hiatal hernia is a condition where part of your stomach pushes up into your chest. We will continue to manage any discomfort with antacids as needed.  -UNINTENTIONAL WEIGHT LOSS: Unintentional weight loss is when you lose weight without trying. We will monitor this and check your weight in 2-3 months.  -RIGHT UPPER QUADRANT ABDOMINAL PAIN: Right upper quadrant abdominal pain is pain in the upper right part of your abdomen. This has improved, but if the pain worsens, you should return sooner and we may consider a CT scan.  -SLEEP APNEA: Sleep apnea is a condition where your breathing stops and starts while you sleep. Your sleep  apnea is well controlled with your CPAP machine, and we will continue this treatment.  INSTRUCTIONS:  Continue taking your current medications as prescribed. Start taking fish oil, 2 pills twice a day. Return in 3 months for a follow-up and cholesterol check. If your right upper quadrant abdominal pain worsens, return sooner. We will also check your weight in 2-3 months to monitor your unintentional weight loss.

## 2023-01-16 NOTE — Assessment & Plan Note (Signed)
His right upper quadrant abdominal pain has improved since the last visit, with occasional discomfort reported. He should return sooner if pain worsens and consider a CT scan if symptoms persist.

## 2023-01-16 NOTE — Assessment & Plan Note (Signed)
His sleep apnea is well controlled with CPAP use. We will continue CPAP use.

## 2023-01-30 ENCOUNTER — Other Ambulatory Visit: Payer: Self-pay | Admitting: Internal Medicine

## 2023-01-30 DIAGNOSIS — I739 Peripheral vascular disease, unspecified: Secondary | ICD-10-CM

## 2023-01-30 DIAGNOSIS — I731 Thromboangiitis obliterans [Buerger's disease]: Secondary | ICD-10-CM

## 2023-01-30 DIAGNOSIS — R1011 Right upper quadrant pain: Secondary | ICD-10-CM

## 2023-01-30 DIAGNOSIS — I7300 Raynaud's syndrome without gangrene: Secondary | ICD-10-CM

## 2023-02-06 ENCOUNTER — Other Ambulatory Visit: Payer: Self-pay | Admitting: *Deleted

## 2023-02-06 DIAGNOSIS — K219 Gastro-esophageal reflux disease without esophagitis: Secondary | ICD-10-CM

## 2023-02-06 MED ORDER — PANTOPRAZOLE SODIUM 40 MG PO TBEC: 40.0000 mg | DELAYED_RELEASE_TABLET | Freq: Every day | ORAL | 1 refills | Status: DC

## 2023-03-09 DIAGNOSIS — G4733 Obstructive sleep apnea (adult) (pediatric): Secondary | ICD-10-CM | POA: Diagnosis not present

## 2023-03-27 ENCOUNTER — Encounter: Payer: Self-pay | Admitting: Internal Medicine

## 2023-03-27 ENCOUNTER — Ambulatory Visit: Payer: HMO | Admitting: Internal Medicine

## 2023-03-27 VITALS — BP 119/60 | HR 64 | Temp 97.7°F | Ht 69.5 in | Wt 173.2 lb

## 2023-03-27 DIAGNOSIS — M150 Primary generalized (osteo)arthritis: Secondary | ICD-10-CM

## 2023-03-27 DIAGNOSIS — D519 Vitamin B12 deficiency anemia, unspecified: Secondary | ICD-10-CM | POA: Diagnosis not present

## 2023-03-27 DIAGNOSIS — M25511 Pain in right shoulder: Secondary | ICD-10-CM | POA: Diagnosis not present

## 2023-03-27 DIAGNOSIS — G2581 Restless legs syndrome: Secondary | ICD-10-CM | POA: Diagnosis not present

## 2023-03-27 DIAGNOSIS — K2971 Gastritis, unspecified, with bleeding: Secondary | ICD-10-CM

## 2023-03-27 DIAGNOSIS — N281 Cyst of kidney, acquired: Secondary | ICD-10-CM

## 2023-03-27 DIAGNOSIS — G4733 Obstructive sleep apnea (adult) (pediatric): Secondary | ICD-10-CM

## 2023-03-27 DIAGNOSIS — J479 Bronchiectasis, uncomplicated: Secondary | ICD-10-CM

## 2023-03-27 DIAGNOSIS — I5189 Other ill-defined heart diseases: Secondary | ICD-10-CM

## 2023-03-27 DIAGNOSIS — K4090 Unilateral inguinal hernia, without obstruction or gangrene, not specified as recurrent: Secondary | ICD-10-CM | POA: Diagnosis not present

## 2023-03-27 DIAGNOSIS — K449 Diaphragmatic hernia without obstruction or gangrene: Secondary | ICD-10-CM

## 2023-03-27 DIAGNOSIS — N200 Calculus of kidney: Secondary | ICD-10-CM

## 2023-03-27 DIAGNOSIS — R5382 Chronic fatigue, unspecified: Secondary | ICD-10-CM

## 2023-03-27 DIAGNOSIS — R634 Abnormal weight loss: Secondary | ICD-10-CM

## 2023-03-27 DIAGNOSIS — G8929 Other chronic pain: Secondary | ICD-10-CM | POA: Diagnosis not present

## 2023-03-27 DIAGNOSIS — F5101 Primary insomnia: Secondary | ICD-10-CM | POA: Diagnosis not present

## 2023-03-27 DIAGNOSIS — Z85820 Personal history of malignant melanoma of skin: Secondary | ICD-10-CM

## 2023-03-27 DIAGNOSIS — G609 Hereditary and idiopathic neuropathy, unspecified: Secondary | ICD-10-CM

## 2023-03-27 DIAGNOSIS — Z79899 Other long term (current) drug therapy: Secondary | ICD-10-CM | POA: Diagnosis not present

## 2023-03-27 DIAGNOSIS — I517 Cardiomegaly: Secondary | ICD-10-CM

## 2023-03-27 DIAGNOSIS — M25561 Pain in right knee: Secondary | ICD-10-CM | POA: Diagnosis not present

## 2023-03-27 DIAGNOSIS — M545 Low back pain, unspecified: Secondary | ICD-10-CM | POA: Diagnosis not present

## 2023-03-27 DIAGNOSIS — E782 Mixed hyperlipidemia: Secondary | ICD-10-CM

## 2023-03-27 DIAGNOSIS — N4289 Other specified disorders of prostate: Secondary | ICD-10-CM

## 2023-03-27 DIAGNOSIS — I700 Atherosclerosis of aorta: Secondary | ICD-10-CM

## 2023-03-27 DIAGNOSIS — G250 Essential tremor: Secondary | ICD-10-CM

## 2023-03-27 DIAGNOSIS — K219 Gastro-esophageal reflux disease without esophagitis: Secondary | ICD-10-CM

## 2023-03-27 DIAGNOSIS — K760 Fatty (change of) liver, not elsewhere classified: Secondary | ICD-10-CM

## 2023-03-27 DIAGNOSIS — R1011 Right upper quadrant pain: Secondary | ICD-10-CM

## 2023-03-27 DIAGNOSIS — M65341 Trigger finger, right ring finger: Secondary | ICD-10-CM

## 2023-03-27 DIAGNOSIS — M19041 Primary osteoarthritis, right hand: Secondary | ICD-10-CM

## 2023-03-27 DIAGNOSIS — I731 Thromboangiitis obliterans [Buerger's disease]: Secondary | ICD-10-CM

## 2023-03-27 DIAGNOSIS — R0789 Other chest pain: Secondary | ICD-10-CM

## 2023-03-27 DIAGNOSIS — M51361 Other intervertebral disc degeneration, lumbar region with lower extremity pain only: Secondary | ICD-10-CM

## 2023-03-27 DIAGNOSIS — Z87891 Personal history of nicotine dependence: Secondary | ICD-10-CM

## 2023-03-27 DIAGNOSIS — K5790 Diverticulosis of intestine, part unspecified, without perforation or abscess without bleeding: Secondary | ICD-10-CM

## 2023-03-27 DIAGNOSIS — E538 Deficiency of other specified B group vitamins: Secondary | ICD-10-CM

## 2023-03-27 LAB — COMPREHENSIVE METABOLIC PANEL
ALT: 14 U/L (ref 0–53)
AST: 17 U/L (ref 0–37)
Albumin: 4.5 g/dL (ref 3.5–5.2)
Alkaline Phosphatase: 68 U/L (ref 39–117)
BUN: 21 mg/dL (ref 6–23)
CO2: 30 meq/L (ref 19–32)
Calcium: 8.9 mg/dL (ref 8.4–10.5)
Chloride: 105 meq/L (ref 96–112)
Creatinine, Ser: 1.03 mg/dL (ref 0.40–1.50)
GFR: 68.48 mL/min (ref >60.00)
Glucose, Bld: 109 mg/dL — ABNORMAL HIGH (ref 70–99)
Potassium: 4.3 meq/L (ref 3.5–5.1)
Sodium: 142 meq/L (ref 135–145)
Total Bilirubin: 0.5 mg/dL (ref 0.2–1.2)
Total Protein: 6.8 g/dL (ref 6.0–8.3)

## 2023-03-27 LAB — CBC WITH DIFFERENTIAL/PLATELET
Basophils Absolute: 0 10*3/uL (ref 0.0–0.1)
Basophils Relative: 0.7 % (ref 0.0–3.0)
Eosinophils Absolute: 0.1 10*3/uL (ref 0.0–0.7)
Eosinophils Relative: 2.3 % (ref 0.0–5.0)
HCT: 43.8 % (ref 39.0–52.0)
Hemoglobin: 14.6 g/dL (ref 13.0–17.0)
Lymphocytes Relative: 27.2 % (ref 12.0–46.0)
Lymphs Abs: 1.7 10*3/uL (ref 0.7–4.0)
MCHC: 33.3 g/dL (ref 30.0–36.0)
MCV: 92.3 fL (ref 78.0–100.0)
Monocytes Absolute: 0.6 10*3/uL (ref 0.1–1.0)
Monocytes Relative: 9 % (ref 3.0–12.0)
Neutro Abs: 3.8 10*3/uL (ref 1.4–7.7)
Neutrophils Relative %: 60.8 % (ref 43.0–77.0)
Platelets: 185 10*3/uL (ref 150.0–400.0)
RBC: 4.74 Mil/uL (ref 4.22–5.81)
RDW: 14 % (ref 11.5–15.5)
WBC: 6.3 10*3/uL (ref 4.0–10.5)

## 2023-03-27 LAB — LIPID PANEL
Cholesterol: 123 mg/dL (ref 0–200)
HDL: 41 mg/dL (ref >39.00)
LDL Cholesterol: 65 mg/dL (ref 0–99)
NonHDL: 81.51
Total CHOL/HDL Ratio: 3
Triglycerides: 81 mg/dL (ref 0.0–149.0)
VLDL: 16.2 mg/dL (ref 0.0–40.0)

## 2023-03-27 LAB — B12 AND FOLATE PANEL
Folate: 16 ng/mL (ref >5.9)
Vitamin B-12: 1492 pg/mL — ABNORMAL HIGH (ref 211–911)

## 2023-03-27 LAB — HEMOGLOBIN A1C: Hgb A1c MFr Bld: 5.9 % (ref 4.6–6.5)

## 2023-03-27 LAB — VITAMIN D 25 HYDROXY (VIT D DEFICIENCY, FRACTURES): VITD: 34.36 ng/mL (ref 30.00–100.00)

## 2023-03-27 LAB — FERRITIN: Ferritin: 53.8 ng/mL (ref 22.0–322.0)

## 2023-03-27 MED ORDER — OMEGA-3-ACID ETHYL ESTERS 1 G PO CAPS: 2.0000 g | ORAL_CAPSULE | Freq: Three times a day (TID) | ORAL | 3 refills | Status: DC

## 2023-03-27 MED ORDER — ALPHA-LIPOIC ACID 300 MG PO TABS: 2.0000 | ORAL_TABLET | Freq: Every day | ORAL | 3 refills | Status: DC

## 2023-03-27 MED ORDER — LIDOCAINE 5 % EX OINT: 1.0000 | TOPICAL_OINTMENT | CUTANEOUS | 0 refills | Status: DC | PRN

## 2023-03-27 NOTE — Progress Notes (Signed)
[facility]: [phone]   -- Medical Office Visit --  Patient:  Stephen Anderson      Age: 82 y.o.       Sex:  male  Date:   03/27/2023 Today's Healthcare Provider: Lula Olszewski, MD  =======================================================================   Assessment & Plan Idiopathic peripheral neuropathy Peripheral Neuropathy They experience chronic burning sensation and numbness in both legs below the knees, likely due to nerve damage from prolonged exposure to toxic paint primer, diabetes, or poor blood flow. Symptoms, present for many years, are worsening. Despite previous treatments with gabapentin, Lyrica, and topical creams, only Lyrica has been somewhat effective. We discussed the risks of increasing Lyrica, including potential side effects like dizziness and drowsiness, and the benefits of fish oil and alpha lipoic acid. They prefer to continue current medications while trying additional supplements and referrals. We will increase Lyrica to 100 mg three times a day, continue fish oil at an increased dose of three capsules in the morning and three at night, and prescribe alpha lipoic acid and lidocaine cream for topical use. Comprehensive lab work including blood and urine tests is ordered. Referrals to neurology, pain management for options like acupuncture, rheumatology to rule out autoimmune causes, and a nutritionist to evaluate for potential nutritional deficiencies are made.  ASSESSMENT: 1. Chronic Toxic/Chemical-Induced Peripheral Neuropathy 2. 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 B12 Deficiency  PLAN:  Pharmacological Management: 1. Lyrica (Pregabalin)    - Increase from 100 mg BID to 100 mg TID    - Rationale: Improve symptom control, manage neuropathic pain  2. Alpha Lipoic Acid (ALA)    - Dosing: 600 mg daily      - Typically started at 600 mg/day      - Can increase to  600 mg TID if well-tolerated    - Rationale: Potential neuroprotective and antioxidant properties    - Advise patient to monitor for any gastrointestinal side effects  3. Lidocaine Patches    - Apply to most symptomatic areas    - Maximum 3 patches in 24 hours    - 12 hours on, 12 hours off  Diagnostic Workup: 1. Laboratory Studies:    - Comprehensive Metabolic Panel    - B12 and Folate levels    - Hemoglobin A1c    - Thyroid Function Tests    - Heavy Metal Screening    - Autoimmune Panel (ANA, RF, anti-neuronal antibodies)    - Vitamin D levels  2. Diagnostic Referrals:    - Neurology consultation    - Pain Management clinic    - Considering Skin Punch Biopsy for small fiber neuropathy   Non-Pharmacological Interventions: 1. Physical Therapy    - Focus on balance exercises    - Nerve gliding techniques    - Low-impact mobility training  2. Lifestyle Recommendations:    - Continue low-impact exercise    - Explore additional pain management techniques    - Consider meditation/mindfulness for pain coping  Follow-up: - Schedule follow-up in 4-6 weeks - Assess medication tolerance and efficacy - Review diagnostic study results - Adjust treatment plan as needed  Patient Education: - Discuss potential triggers and exposure risks - Importance of protective equipment - Pain management strategies - Potential long-term management approach Primary insomnia  LVH (left ventricular hypertrophy)  Lumbar back pain  Mixed hyperlipidemia  Primary osteoarthritis involving multiple joints  OSA on CPAP  Pain in joint of right  shoulder  Recurrent kidney stones  Right inguinal hernia  Chronic pain of right knee  RLS (restless legs syndrome)  RUQ abdominal pain  Thromboangiitis obliterans (Buerger's disease) (HCC)  Trigger finger, right ring finger  Weight loss  History of melanoma  High risk medication use  Hiatal hernia  Grade I diastolic  dysfunction  Gastroesophageal reflux disease, unspecified whether esophagitis present  Former smoker  Gastritis with hemorrhage, unspecified chronicity, unspecified gastritis type  Fatty liver  Essential tremor  Diverticular disease  Degeneration of intervertebral disc of lumbar region with lower extremity pain  Chronic fatigue  Chest discomfort  Calcification of prostate  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, not believe to be due to lupus or systemic sclerosis  Bronchiectasis without complication (HCC)  B12 deficiency  Arthritis of both hands  Aortic atherosclerosis (HCC)  Kidney cyst, acquired      Orders Placed During this Encounter:         Ordered    Ambulatory referral to Pain Clinic        03/27/23 0827    Ambulatory referral to Neurology        03/27/23 0827    B12 and Folate Panel        03/27/23 0827    Thyroid Panel With TSH  Status:  Canceled        03/27/23 0827    Heavy Metals Profile, Urine  Status:  Canceled        03/27/23 0827    ANA w/Reflex if Positive  Status:  Canceled        03/27/23 0827    Rheumatoid factor  Status:  Canceled        03/27/23 0827    Neuronal nuclear autoabs(IFA), IgG-bld        03/27/23 0827    HgB A1c        03/27/23 0827    Ferritin        03/27/23 0827    Comprehensive metabolic panel        03/27/23 0827    CBC with Differential/Platelet        03/27/23 0827    Lipid panel        03/27/23 0827    Vitamin D (25 hydroxy)        03/27/23 0827    omega-3 acid ethyl esters (LOVAZA) 1 g capsule  3 times daily        03/27/23 0827    Alpha-Lipoic Acid 300 MG TABS  Daily        03/27/23 0827    lidocaine (XYLOCAINE) 5 % ointment  As needed        03/27/23 0827    Thyroid Panel With TSH        03/27/23 0853    Rheumatoid factor        03/27/23 0853    ANA w/Reflex if Positive        03/27/23 0854    Heavy Metals Profile, Urine        03/27/23 0858    Neuronal Antibodies, IgG         03/27/23 1226           General Health Maintenance They are active, engaging in regular physical activity such as walking and using a treadmill, and maintain a balanced diet with occasional sweets consumption. They are taking fish oil supplements as recommended. Encouragement for the continuation of regular physical activity, a balanced diet, and fish oil supplementation is provided.  Follow-up A follow-up appointment is scheduled in one month, ensuring blood and urine tests are completed before the next visit. Return in about 1 month (around 04/27/2023) for close follow up acute illness, chronic disease monitoring and management. Future Appointments  Date Time Provider Department Center  05/01/2023  8:20 AM Lula Olszewski, MD [facility]  06/10/2023  9:00 AM Waymon Budge, MD [facility] None    SUBJECTIVE: 82 y.o. 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  Chief complaint: Follow-up (Pt states that he has burning in knees and legs at bedtime. ),  Gastroesophageal Reflux, Hyperlipidemia, and Anemia  AI-Extracted: Discussed the use of AI scribe software for clinical note transcription with the patient, who gave verbal consent to proceed.   The following HPI was synthesized by artificial intelligence using recording of the clinical interaction:  History of Present Illness The patient, an active individual with a history of neuropathy, presents with worsening symptoms of burning and numbness in both legs, primarily below the knees. The discomfort has been progressively worsening over the past year, with the patient noting a significant decline in balance and an increase in unsteadiness. The patient has been managing the discomfort through physical activity, including fishing and gardening, and the use of Lyrica, which has been somewhat effective.  The patient's neuropathy has been a long-standing issue, with the onset dating back several years. Initial workup by a podiatrist yielded no significant findings, leading the patient to manage the symptoms independently. 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 The patient reported repeated exposure to the primer over a period of 2-5 years, with a noticeable connection between the exposure and the worsening of symptoms.  Despite discontinuing the use of the primer for the past 5-10 years, the patient's symptoms have continued to worsen. The patient has also noted an increase in the severity of symptoms when missing doses of Lyrica. The patient has tried various topical treatments, including Voltron cream and a product from [place], but discontinued use due to increased numbness and discomfort.  The patient maintains an active lifestyle, including regular exercise at a local gym. Despite the discomfort, the patient has not let the neuropathy significantly impact their daily activities. However, the patient has expressed concern  about the progressive nature of the symptoms and the impact on their balance and stability.   Problem list overviews that were updated at today's visit: Problem  Idiopathic Peripheral Neuropathy   Evaluated by neurology 2019. Gabapentin and lidocaine patches recommended From toes to knees bilaterally Later switched gabapentin to Lyrica He uses lidocaine. These help, and it helped more when I upped the dose He has bad back disks and buergers as contributors- negative vascular evaluation 05/2022. History B12  deficiency on replacement but it has not resolved. Associated with restless legs syndrome       Med reconciliation: Current Outpatient Medications on File Prior to Visit  Medication Sig   Acetaminophen (TYLENOL ARTHRITIS EXT RELIEF PO) Take 1 tablet by mouth every other day.   Ascorbic Acid (VITAMIN C PO) Take 500 mg by mouth daily.   aspirin 81 MG tablet Take 81 mg by mouth every other day.    atorvastatin (LIPITOR) 40 MG tablet Take 1 tablet (40 mg total) by mouth at bedtime.   caffeine 200 MG TABS tablet Take 200 mg by mouth in the morning and at bedtime. "STAY AWAKE"   celecoxib (CELEBREX) 200 MG capsule Take 1 capsule (200 mg total) by mouth daily.   Cholecalciferol (VITAMIN D-3) 125 MCG (5000 UT) TABS Take 1 tablet by mouth daily.   cilostazol (PLETAL) 50 MG tablet TAKE 1 TABLET BY MOUTH TWICE A DAY   Clobetasol Propionate 0.05 % shampoo    Cyanocobalamin (B-12 PO) Take 1 tablet by mouth daily.   diclofenac Sodium (VOLTAREN) 1 % GEL Apply 2 g topically in the morning and at bedtime. Every morning and every night.   Ferrous Sulfate (IRON PO) Take 65 mg by mouth daily.   fluticasone (CUTIVATE) 0.05 % cream    fluticasone (FLONASE) 50 MCG/ACT nasal spray Place 1 spray into both nostrils daily. (Patient taking differently: Place 1 spray into both nostrils daily. As needed.)   ipratropium (ATROVENT) 0.06 % nasal spray Place 1-2 sprays into both nostrils 4 (four) times daily. As  needed for nasal congestion   ketoconazole (NIZORAL) 2 % shampoo Apply to affected area- let sit 3-5 minutes before rinsing   MAGNESIUM PO Take 400 mg by mouth daily.   NIFEdipine (PROCARDIA XL) 60 MG 24 hr tablet Take 1 tablet (60 mg total) by mouth daily. Dose increased to help improve blood flow and energy. (Patient taking differently: Take 60 mg by mouth daily. Dose increased to help improve blood flow and energy. Patient reported taking 30 mg.)   NIFEdipine (PROCARDIA-XL/NIFEDICAL-XL) 30 MG 24 hr tablet TAKE 1 TABLET BY MOUTH DAILY   NON FORMULARY CPAP   omega-3 acid ethyl esters (LOVAZA) 1 g capsule Take 2 capsules (2 g total) by mouth 2 (two) times daily.   pantoprazole (PROTONIX) 40 MG tablet Take 1 tablet (40 mg total) by mouth daily.   pregabalin (LYRICA) 100 MG capsule Take 1 capsule (100 mg total) by mouth daily. Fill when due   primidone (MYSOLINE) 50 MG tablet TAKE ONE TABLET BY MOUTH EVERY MORNING AND ONE TABLET EVERY EVENING   UNABLE TO FIND 100 mg in the morning and at bedtime. Out back oil.   No current facility-administered medications on file prior to visit.  There are no discontinued medications.    Objective   Physical Exam     03/27/2023    7:48 AM 01/16/2023    8:30 AM 12/06/2022    8:04 AM  Vitals with BMI  Height 5' 9.5" 5' 9.5" 5' 9.5"  Weight 173 lbs 3 oz 173 lbs 174 lbs 13 oz  BMI 25.22 25.19 25.45  Systolic 119 135 098  Diastolic 60 78 64  Pulse 64 61 67   Wt Readings from Last 10 Encounters:  03/27/23 173 lb 3.2 oz (78.6 kg)  01/16/23 173 lb (78.5 kg)  12/06/22 174 lb 12.8 oz (79.3 kg)  10/19/22 176 lb 3.2 oz (79.9 kg)  09/24/22 176 lb (79.8 kg)  09/05/22  176 lb 6.4 oz (80 kg)  06/07/22 178 lb 9.6 oz (81 kg)  06/04/22 178 lb (80.7 kg)  05/29/22 177 lb (80.3 kg)  05/08/22 173 lb 9.6 oz (78.7 kg)   Vital signs reviewed.  Nursing notes reviewed. Weight trend reviewed. Abnormalities and Problem-Specific physical exam findings:  there is minimal  swelling and moderate sensory loss legs below knees. Chronic hoarse voice. no obvious balance issue.  General Appearance:  No acute distress appreciable.   Well-groomed, healthy-appearing male.  Well proportioned with no abnormal fat distribution.  Good muscle tone. Pulmonary:  Normal work of breathing at rest, no respiratory distress apparent. SpO2: 97 %  Musculoskeletal: All extremities are intact.  Neurological:  Awake, alert, oriented, and engaged.  No obvious focal neurological deficits or cognitive impairments.  Sensorium seems unclouded.   Speech is clear and coherent with logical content. Psychiatric:  Appropriate mood, pleasant and cooperative demeanor, thoughtful and engaged during the exam  Results          Results for orders placed or performed in visit on 03/27/23  B12 and Folate Panel  Result Value Ref Range   Vitamin B-12 1,492 (H) 211 - 911 pg/mL   Folate 16.0 >5.9 ng/mL  HgB A1c  Result Value Ref Range   Hgb A1c MFr Bld 5.9 4.6 - 6.5 %  Ferritin  Result Value Ref Range   Ferritin 53.8 22.0 - 322.0 ng/mL  Comprehensive metabolic panel  Result Value Ref Range   Sodium 142 135 - 145 mEq/L   Potassium 4.3 3.5 - 5.1 mEq/L   Chloride 105 96 - 112 mEq/L   CO2 30 19 - 32 mEq/L   Glucose, Bld 109 (H) 70 - 99 mg/dL   BUN 21 6 - 23 mg/dL   Creatinine, Ser 5.28 0.40 - 1.50 mg/dL   Total Bilirubin 0.5 0.2 - 1.2 mg/dL   Alkaline Phosphatase 68 39 - 117 U/L   AST 17 0 - 37 U/L   ALT 14 0 - 53 U/L   Total Protein 6.8 6.0 - 8.3 g/dL   Albumin 4.5 3.5 - 5.2 g/dL   GFR 41.32 >44.01 mL/min   Calcium 8.9 8.4 - 10.5 mg/dL  CBC with Differential/Platelet  Result Value Ref Range   WBC 6.3 4.0 - 10.5 K/uL   RBC 4.74 4.22 - 5.81 Mil/uL   Hemoglobin 14.6 13.0 - 17.0 g/dL   HCT 02.7 25.3 - 66.4 %   MCV 92.3 78.0 - 100.0 fl   MCHC 33.3 30.0 - 36.0 g/dL   RDW 40.3 47.4 - 25.9 %   Platelets 185.0 150.0 - 400.0 K/uL   Neutrophils Relative % 60.8 43.0 - 77.0 %   Lymphocytes Relative  27.2 12.0 - 46.0 %   Monocytes Relative 9.0 3.0 - 12.0 %   Eosinophils Relative 2.3 0.0 - 5.0 %   Basophils Relative 0.7 0.0 - 3.0 %   Neutro Abs 3.8 1.4 - 7.7 K/uL   Lymphs Abs 1.7 0.7 - 4.0 K/uL   Monocytes Absolute 0.6 0.1 - 1.0 K/uL   Eosinophils Absolute 0.1 0.0 - 0.7 K/uL   Basophils Absolute 0.0 0.0 - 0.1 K/uL  Lipid panel  Result Value Ref Range   Cholesterol 123 0 - 200 mg/dL   Triglycerides 56.3 0.0 - 149.0 mg/dL   HDL 87.56 >43.32 mg/dL   VLDL 95.1 0.0 - 88.4 mg/dL   LDL Cholesterol 65 0 - 99 mg/dL   Total CHOL/HDL Ratio 3    NonHDL 81.51  Vitamin D (25 hydroxy)  Result Value Ref Range   VITD 34.36 30.00 - 100.00 ng/mL   Office Visit on 03/27/2023  Component Date Value   Vitamin B-12 03/27/2023 1,492 (H)    Folate 03/27/2023 16.0    Hgb A1c MFr Bld 03/27/2023 5.9    Ferritin 03/27/2023 53.8    Sodium 03/27/2023 142    Potassium 03/27/2023 4.3    Chloride 03/27/2023 105    CO2 03/27/2023 30    Glucose, Bld 03/27/2023 109 (H)    BUN 03/27/2023 21    Creatinine, Ser 03/27/2023 1.03    Total Bilirubin 03/27/2023 0.5    Alkaline Phosphatase 03/27/2023 68    AST 03/27/2023 17    ALT 03/27/2023 14    Total Protein 03/27/2023 6.8    Albumin 03/27/2023 4.5    GFR 03/27/2023 68.48    Calcium 03/27/2023 8.9    WBC 03/27/2023 6.3    RBC 03/27/2023 4.74    Hemoglobin 03/27/2023 14.6    HCT 03/27/2023 43.8    MCV 03/27/2023 92.3    MCHC 03/27/2023 33.3    RDW 03/27/2023 14.0    Platelets 03/27/2023 185.0    Neutrophils Relative % 03/27/2023 60.8    Lymphocytes Relative 03/27/2023 27.2    Monocytes Relative 03/27/2023 9.0    Eosinophils Relative 03/27/2023 2.3    Basophils Relative 03/27/2023 0.7    Neutro Abs 03/27/2023 3.8    Lymphs Abs 03/27/2023 1.7    Monocytes Absolute 03/27/2023 0.6    Eosinophils Absolute 03/27/2023 0.1    Basophils Absolute 03/27/2023 0.0    Cholesterol 03/27/2023 123    Triglycerides 03/27/2023 81.0    HDL 03/27/2023 41.00     VLDL 03/27/2023 16.2    LDL Cholesterol 03/27/2023 65    Total CHOL/HDL Ratio 03/27/2023 3    NonHDL 03/27/2023 81.51    VITD 03/27/2023 34.36   Office Visit on 09/24/2022  Component Date Value   Rheumatoid fact SerPl-aC* 09/24/2022 <10    Sed Rate 09/24/2022 2    Scleroderma (Scl-70) (EN* 09/24/2022 <1.0 NEG    Ribonucleic Protein(ENA)* 09/24/2022 <1.0 NEG    ENA SM Ab Ser-aCnc 09/24/2022 <1.0 NEG    SSA (Ro) (ENA) Antibody,* 09/24/2022 <1.0 NEG    ds DNA Ab 09/24/2022 1    C3 Complement 09/24/2022 124    C4 Complement 09/24/2022 21   Office Visit on 09/05/2022  Component Date Value   WBC 09/05/2022 6.9    RBC 09/05/2022 4.71    Hemoglobin 09/05/2022 14.1    HCT 09/05/2022 42.4    MCV 09/05/2022 90.2    MCHC 09/05/2022 33.2    RDW 09/05/2022 14.9    Platelets 09/05/2022 158.0    Neutrophils Relative % 09/05/2022 62.2    Lymphocytes Relative 09/05/2022 25.1    Monocytes Relative 09/05/2022 9.9    Eosinophils Relative 09/05/2022 2.2    Basophils Relative 09/05/2022 0.6    Neutro Abs 09/05/2022 4.3    Lymphs Abs 09/05/2022 1.7    Monocytes Absolute 09/05/2022 0.7    Eosinophils Absolute 09/05/2022 0.2    Basophils Absolute 09/05/2022 0.0    Ferritin 09/05/2022 29.4    Cholesterol 09/05/2022 123    Triglycerides 09/05/2022 160.0 (H)    HDL 09/05/2022 34.60 (L)    VLDL 09/05/2022 32.0    LDL Cholesterol 09/05/2022 57    Total CHOL/HDL Ratio 09/05/2022 4    NonHDL 09/05/2022 88.83    Sodium 09/05/2022 142    Potassium 09/05/2022 4.4  Chloride 09/05/2022 107    CO2 09/05/2022 28    Glucose, Bld 09/05/2022 101 (H)    BUN 09/05/2022 19    Creatinine, Ser 09/05/2022 1.17    Total Bilirubin 09/05/2022 0.4    Alkaline Phosphatase 09/05/2022 73    AST 09/05/2022 18    ALT 09/05/2022 15    Total Protein 09/05/2022 6.5    Albumin 09/05/2022 4.0    GFR 09/05/2022 59.00 (L)    Calcium 09/05/2022 8.6    VITD 09/05/2022 46.57    Microalb, Ur 09/05/2022 1.8    Creatinine,U  09/05/2022 97.2    Microalb Creat Ratio 09/05/2022 1.9    Magnesium 09/05/2022 1.9    Phosphorus 09/05/2022 2.1 (L)    Uric Acid, Serum 09/05/2022 5.7   Scanned Document on 06/20/2022  Component Date Value   [facility] Colonoscopy 06/20/2022 See Report (in chart)   Appointment on 06/04/2022  Component Date Value   Weight 06/04/2022 2,848    Height 06/04/2022 70    BP 06/04/2022 122/70    S' Lateral 06/04/2022 2.15    Area-P 1/2 06/04/2022 3.77    MV M vel 06/04/2022 3.23    MV Peak grad 06/04/2022 41.6    Est EF 06/04/2022 55 - 60%   Hospital Outpatient Visit on 05/20/2022  Component Date Value   Creatinine, Ser 05/20/2022 1.30 (H)   Office Visit on 05/08/2022  Component Date Value   WBC 05/08/2022 6.3    RBC 05/08/2022 4.85    Platelets 05/08/2022 171.0    Hemoglobin 05/08/2022 14.8    HCT 05/08/2022 43.7    MCV 05/08/2022 90.1    MCHC 05/08/2022 34.0    RDW 05/08/2022 14.6    Anti Nuclear Antibody (A* 05/08/2022 POSITIVE (A)    Anti-MPO Antibodies 05/08/2022 <0.2    Anti-PR3 Antibodies 05/08/2022 <0.2    C-ANCA 05/08/2022 <1:20    P-ANCA 05/08/2022 <1:20    Atypical pANCA 05/08/2022 <1:20    ANA Titer 1 05/08/2022 1:40 (H)    ANA Pattern 1 05/08/2022 Cytoplasmic (A)    ANA TITER 05/08/2022 1:80 (H)    ANA PATTERN 05/08/2022 Nuclear, Homogeneous (A)   Lab on 04/17/2022  Component Date Value   Fecal Occult Bld 04/17/2022 Negative   Lab on 04/05/2022  Component Date Value   PSA 04/05/2022 1.14   No image results found. No results found.US Renal Result Date: 05/27/2022 CLINICAL DATA:  Cyst EXAM: RENAL / URINARY TRACT ULTRASOUND COMPLETE COMPARISON:  04/26/2022 FINDINGS: Right Kidney: Length: 11.6 cm. Echogenicity within normal limits. No hydronephrosis. Midpole cyst measures 1.7 cm. This does not need follow up. Left Kidney: Length: 11.6 cm. Echogenicity within normal limits. No hydronephrosis. Lower pole cyst measures 1.9 cm. This does not need follow up. Bladder: Appears  normal for degree of bladder distention. IMPRESSION: Small bilateral renal cysts.  Otherwise unremarkable study. Electronically Signed   By: Layla Maw M.D.   On: 05/27/2022 23:31   US ARTERIAL LOWER EXTREMITY DUPLEX BILATERAL Result Date: 05/25/2022 CLINICAL DATA:  Peripheral arterial disease, Buerger's disease, Raynaud's disease EXAM: BILATERAL LOWER EXTREMITY ARTERIAL DUPLEX SCAN TECHNIQUE: Gray-scale sonography as well as color Doppler and duplex ultrasound was performed to evaluate the arteries of both lower extremities including the common, superficial and profunda femoral arteries, popliteal artery and calf arteries. COMPARISON:  None Available. FINDINGS: Right Lower Extremity ABI: 1.3 Inflow: Normal common femoral arterial waveforms and velocities. No evidence of inflow (aortoiliac) disease. Outflow: Normal profunda femoral, superficial femoral and popliteal arterial waveforms and  velocities. No focal elevation of the PSV to suggest stenosis. Runoff: Normal posterior and anterior tibial arterial waveforms and velocities. Vessels are patent to the ankle. Left Lower Extremity ABI: 1.3 Inflow: Normal common femoral arterial waveforms and velocities. No evidence of inflow (aortoiliac) disease. Outflow: Normal profunda femoral, superficial femoral and popliteal arterial waveforms and velocities. No focal elevation of the PSV to suggest stenosis. Runoff: Normal posterior and anterior tibial arterial waveforms and velocities. Vessels are patent to the ankle. IMPRESSION: Normal exam no evidence of hemodynamically significant peripheral arterial disease. Signed, Sterling Big, MD, RPVI [facility] Electronically Signed   By: Malachy Moan M.D.   On: 05/25/2022 05:50   US ARTERIAL ABI (SCREENING LOWER EXTREMITY) Result Date: 05/25/2022 CLINICAL DATA:  Peripheral arterial disease, Buerger's disease, Raynaud's disease EXAM: BILATERAL LOWER  EXTREMITY ARTERIAL DUPLEX SCAN TECHNIQUE: Gray-scale sonography as well as color Doppler and duplex ultrasound was performed to evaluate the arteries of both lower extremities including the common, superficial and profunda femoral arteries, popliteal artery and calf arteries. COMPARISON:  None Available. FINDINGS: Right Lower Extremity ABI: 1.3 Inflow: Normal common femoral arterial waveforms and velocities. No evidence of inflow (aortoiliac) disease. Outflow: Normal profunda femoral, superficial femoral and popliteal arterial waveforms and velocities. No focal elevation of the PSV to suggest stenosis. Runoff: Normal posterior and anterior tibial arterial waveforms and velocities. Vessels are patent to the ankle. Left Lower Extremity ABI: 1.3 Inflow: Normal common femoral arterial waveforms and velocities. No evidence of inflow (aortoiliac) disease. Outflow: Normal profunda femoral, superficial femoral and popliteal arterial waveforms and velocities. No focal elevation of the PSV to suggest stenosis. Runoff: Normal posterior and anterior tibial arterial waveforms and velocities. Vessels are patent to the ankle. IMPRESSION: Normal exam no evidence of hemodynamically significant peripheral arterial disease. Signed, Sterling Big, MD, RPVI [facility] Electronically Signed   By: Malachy Moan M.D.   On: 05/25/2022 05:50         Additional Info: This encounter employed real-time, collaborative documentation. The patient actively reviewed and updated their medical record on a shared screen, ensuring transparency and facilitating joint problem-solving for the problem list, overview, and plan. This approach promotes accurate, informed care. The treatment plan was discussed and reviewed in detail, including medication safety, potential side effects, and all patient questions. We confirmed understanding and comfort with the plan. Follow-up  instructions were established, including contacting the office for any concerns, returning if symptoms worsen, persist, or new symptoms develop, and precautions for potential emergency department visits.

## 2023-03-27 NOTE — Patient Instructions (Addendum)
VISIT SUMMARY:  During today's visit, we discussed your ongoing symptoms of burning and numbness in both legs, which have been progressively worsening over the past year. We reviewed your history of neuropathy and your previous treatments, including the use of Lyrica, which has been somewhat effective. We also discussed your concerns about balance and stability due to the worsening symptoms.  YOUR PLAN:  -PERIPHERAL NEUROPATHY: Peripheral neuropathy is a condition where the nerves outside of your brain and spinal cord are damaged, causing symptoms like burning, numbness, and pain. This can be due to various factors, including exposure to toxic substances, diabetes, or poor blood flow. We will increase your Lyrica dose to 100 mg three times a day, continue fish oil at an increased dose of three capsules in the morning and three at night, and add alpha lipoic acid and lidocaine cream for topical use. We have also ordered comprehensive lab work and made referrals to neurology, pain management, rheumatology, and a nutritionist to further evaluate and manage your condition.  -GENERAL HEALTH MAINTENANCE: You are maintaining an active lifestyle with regular physical activity and a balanced diet, which is excellent for your overall health. Continue with your current exercise routine and balanced diet, and keep taking your fish oil supplements as recommended.  INSTRUCTIONS:  Please complete the blood and urine tests before your next visit. We have scheduled a follow-up appointment in one month to review your test results and assess your progress. Additionally, you will be referred to specialists in neurology, pain management, rheumatology, and nutrition for further evaluation and management of your condition.     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 paint primer exposure Symptoms: Burning, numbness, balance issues  CHANGES IN  TREATMENT:  Medication Adjustments Lyrica (Pregabalin): Increased to 100 mg TID Started Alpha Lipoic Acid: 600 mg daily Lidocaine patches as needed  RECOMMENDED TESTS: Comprehensive Metabolic Panel B12 and Folate Levels Hemoglobin A1c Thyroid Function Tests Heavy Metal Screening Autoimmune Panel  REFERRALS:  Neurology Consultation Pain Management Clinic  NEXT STEPS:  Continue low-impact exercise Use prescribed medications as directed Schedule follow-up in 4-6 weeks  PATIENT INSTRUCTIONS:  Take medications as prescribed Report any new or worsening symptoms Attend scheduled referral appointments  FOLLOW-UP APPOINTMENT: Return in about 1 month (around 04/27/2023) for close follow up acute illness, chronic disease monitoring and management.

## 2023-03-27 NOTE — Assessment & Plan Note (Addendum)
Peripheral Neuropathy They experience chronic burning sensation and numbness in both legs below the knees, likely due to nerve damage from prolonged exposure to toxic paint primer, diabetes, or poor blood flow. Symptoms, present for many years, are worsening. Despite previous treatments with gabapentin, Lyrica, and topical creams, only Lyrica has been somewhat effective. We discussed the risks of increasing Lyrica, including potential side effects like dizziness and drowsiness, and the benefits of fish oil and alpha lipoic acid. They prefer to continue current medications while trying additional supplements and referrals. We will increase Lyrica to 100 mg three times a day, continue fish oil at an increased dose of three capsules in the morning and three at night, and prescribe alpha lipoic acid and lidocaine cream for topical use. Comprehensive lab work including blood and urine tests is ordered. Referrals to neurology, pain management for options like acupuncture, rheumatology to rule out autoimmune causes, and a nutritionist to evaluate for potential nutritional deficiencies are made.  ASSESSMENT: 1. Chronic Toxic/Chemical-Induced Peripheral Neuropathy 2. 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 B12 Deficiency  PLAN:  Pharmacological Management: 1. Lyrica (Pregabalin)    - Increase from 100 mg BID to 100 mg TID    - Rationale: Improve symptom control, manage neuropathic pain  2. Alpha Lipoic Acid (ALA)    - Dosing: 600 mg daily      - Typically started at 600 mg/day      - Can increase to 600 mg TID if well-tolerated    - Rationale: Potential neuroprotective and antioxidant properties    - Advise patient to monitor for any gastrointestinal side effects  3. Lidocaine Patches    - Apply to most symptomatic areas    - Maximum 3 patches in 24 hours    - 12 hours on, 12 hours off  Diagnostic Workup: 1. Laboratory Studies:    - Comprehensive  Metabolic Panel    - B12 and Folate levels    - Hemoglobin A1c    - Thyroid Function Tests    - Heavy Metal Screening    - Autoimmune Panel (ANA, RF, anti-neuronal antibodies)    - Vitamin D levels  2. Diagnostic Referrals:    - Neurology consultation    - Pain Management clinic    - Considering Skin Punch Biopsy for small fiber neuropathy   Non-Pharmacological Interventions: 1. Physical Therapy    - Focus on balance exercises    - Nerve gliding techniques    - Low-impact mobility training  2. Lifestyle Recommendations:    - Continue low-impact exercise    - Explore additional pain management techniques    - Consider meditation/mindfulness for pain coping  Follow-up: - Schedule follow-up in 4-6 weeks - Assess medication tolerance and efficacy - Review diagnostic study results - Adjust treatment plan as needed  Patient Education: - Discuss potential triggers and exposure risks - Importance of protective equipment - Pain management strategies - Potential long-term management approach

## 2023-03-28 LAB — THYROID PANEL WITH TSH
Free Thyroxine Index: 2.1 (ref 1.4–3.8)
T3 Uptake: 28 % (ref 22–35)
T4, Total: 7.5 ug/dL (ref 4.9–10.5)
TSH: 1.05 m[IU]/L (ref 0.40–4.50)

## 2023-03-28 LAB — ANA W/REFLEX IF POSITIVE: Anti Nuclear Antibody (ANA): NEGATIVE

## 2023-03-28 LAB — RHEUMATOID FACTOR: Rheumatoid fact SerPl-aCnc: <10 [IU]/mL (ref <14)

## 2023-04-04 DIAGNOSIS — M25511 Pain in right shoulder: Secondary | ICD-10-CM | POA: Diagnosis not present

## 2023-04-04 DIAGNOSIS — N200 Calculus of kidney: Secondary | ICD-10-CM | POA: Diagnosis not present

## 2023-04-04 DIAGNOSIS — I517 Cardiomegaly: Secondary | ICD-10-CM | POA: Diagnosis not present

## 2023-04-04 DIAGNOSIS — E782 Mixed hyperlipidemia: Secondary | ICD-10-CM | POA: Diagnosis not present

## 2023-04-04 DIAGNOSIS — K4090 Unilateral inguinal hernia, without obstruction or gangrene, not specified as recurrent: Secondary | ICD-10-CM | POA: Diagnosis not present

## 2023-04-04 DIAGNOSIS — G609 Hereditary and idiopathic neuropathy, unspecified: Secondary | ICD-10-CM | POA: Diagnosis not present

## 2023-04-04 DIAGNOSIS — G8929 Other chronic pain: Secondary | ICD-10-CM | POA: Diagnosis not present

## 2023-04-04 DIAGNOSIS — M150 Primary generalized (osteo)arthritis: Secondary | ICD-10-CM | POA: Diagnosis not present

## 2023-04-04 DIAGNOSIS — F5101 Primary insomnia: Secondary | ICD-10-CM | POA: Diagnosis not present

## 2023-04-04 DIAGNOSIS — M545 Low back pain, unspecified: Secondary | ICD-10-CM | POA: Diagnosis not present

## 2023-04-04 DIAGNOSIS — M25561 Pain in right knee: Secondary | ICD-10-CM | POA: Diagnosis not present

## 2023-04-04 DIAGNOSIS — G4733 Obstructive sleep apnea (adult) (pediatric): Secondary | ICD-10-CM | POA: Diagnosis not present

## 2023-04-07 LAB — HEAVY METALS PROFILE, URINE
Arsenic, 24H Ur: <10 ug/L (ref <=80)
Lead, 24 hr urine: <10 ug/L (ref <80)
Mercury, 24H Ur: <4 ug/L (ref <=20)

## 2023-04-22 ENCOUNTER — Telehealth: Payer: Self-pay | Admitting: Internal Medicine

## 2023-04-22 NOTE — Telephone Encounter (Signed)
 Prescription Request  04/22/2023  LOV: 03/27/2023  What is the name of the medication or equipment? primidone  (MYSOLINE ) 50 MG tablet   Have you contacted your pharmacy to request a refill? Yes   Which pharmacy would you like this sent to?  [facility] [id number] [place], [place] - [address] [place] [address] Phone: [phone] Fax: [phone]    Patient notified that their request is being sent to the clinical staff for review and that they should receive a response within 2 business days.   Please advise at Mobile [phone] (mobile)

## 2023-05-01 ENCOUNTER — Ambulatory Visit: Payer: HMO | Admitting: Internal Medicine

## 2023-05-01 ENCOUNTER — Encounter: Payer: Self-pay | Admitting: Internal Medicine

## 2023-05-01 VITALS — BP 118/68 | HR 56 | Temp 97.3°F | Ht 69.5 in | Wt 176.2 lb

## 2023-05-01 DIAGNOSIS — G629 Polyneuropathy, unspecified: Secondary | ICD-10-CM | POA: Diagnosis not present

## 2023-05-01 DIAGNOSIS — N182 Chronic kidney disease, stage 2 (mild): Secondary | ICD-10-CM

## 2023-05-01 DIAGNOSIS — J479 Bronchiectasis, uncomplicated: Secondary | ICD-10-CM | POA: Diagnosis not present

## 2023-05-01 DIAGNOSIS — R5383 Other fatigue: Secondary | ICD-10-CM | POA: Diagnosis not present

## 2023-05-01 DIAGNOSIS — R0789 Other chest pain: Secondary | ICD-10-CM

## 2023-05-01 DIAGNOSIS — R7303 Prediabetes: Secondary | ICD-10-CM | POA: Diagnosis not present

## 2023-05-01 DIAGNOSIS — G2581 Restless legs syndrome: Secondary | ICD-10-CM

## 2023-05-01 MED ORDER — PREGABALIN 100 MG PO CAPS: 100.0000 mg | ORAL_CAPSULE | Freq: Every day | ORAL | 1 refills | Status: DC

## 2023-05-01 NOTE — Assessment & Plan Note (Signed)
Following with cardiology, taking good medication(s), advised patient to go to emergency room if it ever flares

## 2023-05-01 NOTE — Assessment & Plan Note (Signed)
Restless Legs Syndrome Symptoms persist but have improved, primarily at night. A previous neurologist consultation did not result in ongoing follow-up. The potential benefit of Lyrica for neuropathy and restless legs syndrome was discussed. Resume Lyrica for neuropathy and restless legs syndrome. Monitor symptoms and consider re-evaluation if persistent.

## 2023-05-01 NOTE — Patient Instructions (Signed)
VISIT SUMMARY:  You came in today for your one-month follow-up. Overall, you are in good health and maintaining an active lifestyle, including regular exercise and outdoor activities. You are managing multiple chronic conditions with a combination of medications, lifestyle modifications, and regular exercise. You reported improvements in several areas, including blood flow to your extremities and reduced tremors.  YOUR PLAN:  -FATIGUE: Fatigue is likely due to your high activity levels and suboptimal sleep. Previous tests for other causes were negative. Continue using your CPAP machine for sleep apnea and follow up with a sleep specialist in February. Monitor for any new symptoms or changes in sleep quality.  -PREDIABETES: Prediabetes means your blood sugar levels are higher than normal but not high enough to be classified as diabetes. You have been reducing your intake of sugary drinks, which is good. Continue to reduce sugary drinks and consider switching to alternatives like Pepsi Zero. Monitor your blood glucose levels regularly.  -CHRONIC KIDNEY DISEASE (STAGE 2): Chronic kidney disease means your kidneys are not functioning as well as they should. Your condition has improved from stage 3 to stage 2 and is currently well-managed. Continue with your current management and monitoring.  -RESTLESS LEGS SYNDROME: Restless legs syndrome causes an uncontrollable urge to move your legs, usually at night. Your symptoms have improved but still disturb your sleep occasionally. Resume taking Lyrica for neuropathy and restless legs syndrome. Monitor your symptoms and consider re-evaluation if they persist.  -NEUROPATHY: Neuropathy is nerve damage that can cause pain, often due to chemical exposure. Your symptoms have improved but are still present. Resume taking Lyrica for neuropathy. Monitor your symptoms and adjust treatment as necessary.  -BRONCHIECTASIS: Bronchiectasis is a condition where the airways in  your lungs are damaged, making it hard to clear mucus. Your condition is well-managed with no current lung issues. No further follow-up is required at this time.  -GENERAL HEALTH MAINTENANCE: Continue your regular physical activity and consider using healthier cooking oils like avocado oil. Fish oil supplements are beneficial for cardiovascular health. Maintain your current medications and lifestyle for overall health.  INSTRUCTIONS:  Please schedule a follow-up appointment in three months and print the updated medication list at the front desk.It was a pleasure seeing you today! Your health and satisfaction are our top priorities.  Glenetta Hew, MD  Your Providers PCP: Lula Olszewski, MD,  [phone]) Referring Provider: Lula Olszewski, MD,  [phone]) Care Team Provider: Vladimir Faster, DO,  [phone]) Care Team Provider: Waymon Budge, MD,  [phone]) Care Team Provider: Sharyon Cable Care Team Provider: Eugenia Mcalpine, MD,  [phone]) Care Team Provider: Park Liter, DPM Care Team Provider: Bernette Redbird, MD,  [phone]) Care Team Provider: Maris Berger, MD,  [phone]) Care Team Provider: Maris Berger, MD,  [phone]) Care Team Provider: Janalyn Harder, MD Care Team Provider: Glyn Ade, PA-C Care Team Provider: Jodelle Red, MD,  [phone]) Care Team Provider: Charlott Rakes, MD,  [phone]) Care Team Provider: Fuller Plan, MD,  [phone])     NEXT STEPS: [x]  Early Intervention: Schedule sooner appointment, call our on-call services, or go to emergency room if there is any significant Increase in pain or discomfort New or worsening symptoms Sudden or severe changes in your health [x]  Flexible Follow-Up: We recommend a No follow-ups on file. for optimal routine care. This allows for progress monitoring and treatment adjustments. [x]  Preventive Care: Schedule your annual preventive  care visit! It's typically covered by insurance and helps identify potential health issues  early. [x]  Lab & X-ray Appointments: Incomplete tests scheduled today, or call to schedule. X-rays: [facility] (M-F, 8:30am-noon or 1pm-5pm). [x]  Medical Information Release: Sign a release form at front desk to obtain relevant medical information we don't have.  MAKING THE MOST OF OUR FOCUSED 20 MINUTE APPOINTMENTS: [x]   Clearly state your top concerns at the beginning of the visit to focus our discussion [x]   If you anticipate you will need more time, please inform the front desk during scheduling - we can book multiple appointments in the same week. [x]   If you have transportation problems- use our convenient video appointments or ask about transportation support. [x]   We can get down to business faster if you use MyChart to update information before the visit and submit non-urgent questions before your visit. Thank you for taking the time to provide details through MyChart.  Let our nurse know and she can import this information into your encounter documents.  Arrival and Wait Times: [x]   Arriving on time ensures that everyone receives prompt attention. [x]   Early morning (8a) and afternoon (1p) appointments tend to have shortest wait times. [x]   Unfortunately, we cannot delay appointments for late arrivals or hold slots during phone calls.  Getting Answers and Following Up [x]   Simple Questions & Concerns: For quick questions or basic follow-up after your visit, reach [place] at [phone] or MyChart messaging. [x]   Complex Concerns: If your concern is more complex, scheduling an appointment might be best. Discuss this with the staff to find the most suitable option. [x]   Lab & Imaging Results: We'll contact you directly if results are abnormal or you don't use MyChart. Most normal results will be on MyChart within 2-3 business days, with a review message from Dr. Jon Billings. Haven't heard  back in 2 weeks? Need results sooner? Contact us at [phone]. [x]   Referrals: Our referral coordinator will manage specialist referrals. The specialist's office should contact you within 2 weeks to schedule an appointment. Call us if you haven't heard from them after 2 weeks.  Staying Connected [x]   MyChart: Activate your MyChart for the fastest way to access results and message [place]. See the last page of this paperwork for instructions on how to activate.  Bring to Your Next Appointment [x]   Medications: Please bring all your medication bottles to your next appointment to ensure we have an accurate record of your prescriptions. [x]   Health Diaries: If you're monitoring any health conditions at home, keeping a diary of your readings can be very helpful for discussions at your next appointment.  Billing [x]   X-ray & Lab Orders: These are billed by separate companies. Contact the invoicing company directly for questions or concerns. [x]   Visit Charges: Discuss any billing inquiries with our administrative services team.  Your Satisfaction Matters [x]   Share Your Experience: We strive for your satisfaction! If you have any complaints, or preferably compliments, please let Dr. Jon Billings know directly or contact our Practice Administrators, Edwena Felty or [employer], by asking at the front desk.   Reviewing Your Records [x]   Review this early draft of your clinical encounter notes below and the final encounter summary tomorrow on MyChart after its been completed.  All orders placed so far are visible here: Fatigue, unspecified type  Prediabetes  Chronic kidney disease, stage 2 (mild)  Neuropathy -     Pregabalin; Take 1 capsule (100 mg total) by mouth daily. Fill when due  Dispense: 90 capsule; Refill: 1  Bronchiectasis without complication (HCC)  Chest discomfort  RLS (restless legs syndrome)

## 2023-05-01 NOTE — Assessment & Plan Note (Signed)
Neuropathy Likely secondary to chemical exposure from oil-based primer. Symptoms have improved but are still present. The condition is well-managed with no current lung issues. Regular exercise is possible without significant respiratory symptoms. No further follow-up is required at this time.

## 2023-05-01 NOTE — Progress Notes (Signed)
==============================  [facility]: [phone]   -- Medical Office Visit --  Patient: Stephen Anderson      Age: 83 y.o.       Sex:  male  Date:   05/01/2023 Today's Healthcare Provider: Lula Olszewski, MD  ==============================   CHIEF COMPLAINT: 1 month follow-up, Medical Management of Chronic Issues, and Fatigue  SUBJECTIVE: Background This is a 83 y.o. 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  History of Present Illness The patient, with a history of multiple medical conditions, presented for a one-month follow-up. The patient reported overall good health for his age, maintaining an active lifestyle, including regular exercise at the local gym and outdoor activities such as boating. The patient reported taking fish oil supplements to maintain vascular health, as he has some hardening of his blood vessels.  The patient has a history of bronchiectasis but reported no current lung issues. He continues  to exercise regularly, including jogging, which does not cause any pain in his fingertips or toes. The patient reported an improvement in blood flow to the extremities, with less frequent episodes of white fingertips.  The patient reported occasional fatigue, which he attributed to his active lifestyle. He takes a caffeine tablet daily to help with wakefulness. The patient also reported taking a medication for nerve pain related to a previous exposure to oil-based primer.  The patient has a history of restless legs syndrome, which he reported has improved but still occasionally disturbs his sleep. He also reported a history of gastritis but currently has no significant heartburn issues.  The patient has a history of back pain, which he manages with Voltaren cream and regular exercise. He also reported a tremor, which has significantly improved. The patient has a history of prediabetes and has been advised to monitor his carbohydrate intake.  The patient reported occasional chest pain, which he attributed to muscle strain or acid reflux. He also reported a history of liver sensitivity, which has improved. The patient is compliant with his CPAP machine for sleep apnea, which he believes has also helped his lung health.  In summary, the patient is managing multiple chronic conditions with a combination of medications, lifestyle modifications, and regular exercise. He reported overall good health and an active lifestyle.   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  Discussed Past Medical History - Bronchiectasis -  Fingertips pain - Fatigue - Chest pain - Back pain - Restless legs    Problem list overviews that were updated at today's visit:No problems updated.  Today's Verbally Confirmed Medications - Fish oil - Cholesterol pills - Voltaren cream Current Outpatient Medications on File Prior to Visit  Medication Sig   Acetaminophen (TYLENOL ARTHRITIS EXT RELIEF PO) Take 1 tablet by mouth every other day.   Alpha-Lipoic Acid 300 MG TABS Take 2 tablets (600 mg total) by mouth daily at 6 (six) AM.   Ascorbic Acid (VITAMIN C PO) Take 500 mg by mouth daily.   aspirin 81 MG tablet Take 81 mg by mouth every other day.    atorvastatin (LIPITOR) 40 MG tablet Take 1 tablet (40 mg total) by mouth at bedtime.   caffeine 200 MG TABS tablet Take 200 mg by mouth in the morning and at bedtime. "STAY AWAKE"   celecoxib (CELEBREX) 200 MG capsule Take 1 capsule (200 mg total) by mouth daily.   Cholecalciferol (VITAMIN D-3) 125 MCG (5000 UT) TABS Take 1 tablet by mouth daily.   cilostazol (PLETAL) 50 MG tablet TAKE 1 TABLET BY MOUTH TWICE A DAY   Clobetasol Propionate 0.05 % shampoo    Cyanocobalamin (B-12 PO) Take 1 tablet by mouth daily.   diclofenac Sodium (VOLTAREN) 1 % GEL Apply 2 g topically in the morning and at bedtime. Every morning and every night.   Ferrous Sulfate (IRON PO) Take 65 mg by mouth daily.   fluticasone (CUTIVATE) 0.05 % cream    fluticasone (FLONASE) 50 MCG/ACT nasal spray Place 1 spray into both nostrils daily. (Patient taking differently: Place 1 spray into both nostrils daily. As needed.)   ipratropium (ATROVENT) 0.06 % nasal spray Place 1-2 sprays into both nostrils 4 (four) times daily. As needed for nasal congestion   ketoconazole (NIZORAL) 2 % shampoo Apply to affected area- let sit 3-5 minutes  before rinsing   lidocaine (XYLOCAINE) 5 % ointment Apply 1 Application topically as needed.   MAGNESIUM PO Take 400 mg by mouth daily.   NIFEdipine (PROCARDIA XL) 60 MG 24 hr tablet Take 1 tablet (60 mg total) by mouth daily. Dose increased to help improve blood flow and energy. (Patient taking differently: Take 60 mg by mouth daily. Dose increased to help improve blood flow and energy. Patient reported taking 30 mg.)   NIFEdipine (PROCARDIA-XL/NIFEDICAL-XL) 30 MG 24 hr tablet TAKE 1 TABLET BY MOUTH DAILY   NON FORMULARY CPAP   omega-3 acid ethyl esters (LOVAZA) 1 g capsule Take 2 capsules (2 g total) by mouth 2 (two) times daily.   omega-3 acid ethyl esters (LOVAZA) 1 g capsule Take 2 capsules (2 g total) by mouth in the morning, at noon, and at bedtime.   pantoprazole (PROTONIX) 40 MG tablet Take 1 tablet (40 mg total) by mouth daily.   primidone (MYSOLINE) 50 MG tablet TAKE ONE TABLET BY MOUTH EVERY MORNING AND ONE TABLET EVERY EVENING   UNABLE TO FIND 100 mg in the morning and at bedtime. Out back oil.   No current facility-administered medications on file prior to visit.   Medications Discontinued During This Encounter  Medication Reason   pregabalin (LYRICA) 100 MG capsule Reorder      Objective   Physical Exam     05/01/2023    8:28 AM 03/27/2023    7:48 AM 01/16/2023    8:30 AM  Vitals with BMI  Height 5' 9.5" 5' 9.5" 5' 9.5"  Weight 176 lbs  3 oz 173 lbs 3 oz 173 lbs  BMI 25.66 25.22 25.19  Systolic 118 119 295  Diastolic 68 60 78  Pulse 56 64 61   Wt Readings from Last 10 Encounters:  05/01/23 176 lb 3.2 oz (79.9 kg)  03/27/23 173 lb 3.2 oz (78.6 kg)  01/16/23 173 lb (78.5 kg)  12/06/22 174 lb 12.8 oz (79.3 kg)  10/19/22 176 lb 3.2 oz (79.9 kg)  09/24/22 176 lb (79.8 kg)  09/05/22 176 lb 6.4 oz (80 kg)  06/07/22 178 lb 9.6 oz (81 kg)  06/04/22 178 lb (80.7 kg)  05/29/22 177 lb (80.3 kg)   Vital signs reviewed.  Nursing notes reviewed. Weight trend  reviewed. Abnormalities and Problem-Specific physical exam findings:  truncal adiposity   General Appearance:  No acute distress appreciable.   Well-groomed, healthy-appearing male.  Well proportioned with no abnormal fat distribution.  Good muscle tone. Pulmonary:  Normal work of breathing at rest, no respiratory distress apparent. SpO2: 97 %  Musculoskeletal: All extremities are intact.  Neurological:  Awake, alert, oriented, and engaged.  No obvious focal neurological deficits or cognitive impairments.  Sensorium seems unclouded.   Speech is clear and coherent with logical content. Psychiatric:  Appropriate mood, pleasant and cooperative demeanor, thoughtful and engaged during the exam    No results found for any visits on 05/01/23. Office Visit on 03/27/2023  Component Date Value   Vitamin B-12 03/27/2023 1,492 (H)    Folate 03/27/2023 16.0    Hgb A1c MFr Bld 03/27/2023 5.9    Ferritin 03/27/2023 53.8    Sodium 03/27/2023 142    Potassium 03/27/2023 4.3    Chloride 03/27/2023 105    CO2 03/27/2023 30    Glucose, Bld 03/27/2023 109 (H)    BUN 03/27/2023 21    Creatinine, Ser 03/27/2023 1.03    Total Bilirubin 03/27/2023 0.5    Alkaline Phosphatase 03/27/2023 68    AST 03/27/2023 17    ALT 03/27/2023 14    Total Protein 03/27/2023 6.8    Albumin 03/27/2023 4.5    GFR 03/27/2023 68.48    Calcium 03/27/2023 8.9    WBC 03/27/2023 6.3    RBC 03/27/2023 4.74    Hemoglobin 03/27/2023 14.6    HCT 03/27/2023 43.8    MCV 03/27/2023 92.3    MCHC 03/27/2023 33.3    RDW 03/27/2023 14.0    Platelets 03/27/2023 185.0    Neutrophils Relative % 03/27/2023 60.8    Lymphocytes Relative 03/27/2023 27.2    Monocytes Relative 03/27/2023 9.0    Eosinophils Relative 03/27/2023 2.3    Basophils Relative 03/27/2023 0.7    Neutro Abs 03/27/2023 3.8    Lymphs Abs 03/27/2023 1.7    Monocytes Absolute 03/27/2023 0.6    Eosinophils Absolute 03/27/2023 0.1    Basophils Absolute 03/27/2023 0.0     Cholesterol 03/27/2023 123    Triglycerides 03/27/2023 81.0    HDL 03/27/2023 41.00    VLDL 03/27/2023 16.2    LDL Cholesterol 03/27/2023 65    Total CHOL/HDL Ratio 03/27/2023 3    NonHDL 03/27/2023 81.51    VITD 03/27/2023 34.36    Anti Nuclear Antibody (A* 03/27/2023 Negative    Rheumatoid fact SerPl-aC* 03/27/2023 <10    T3 Uptake 03/27/2023 28    T4, Total 03/27/2023 7.5    Free Thyroxine Index 03/27/2023 2.1    TSH 03/27/2023 1.05    Arsenic, 24H Ur 04/04/2023 <10    Lead, 24 hr urine 04/04/2023 <10  Mercury, 24H Ur 04/04/2023 <4   Office Visit on 09/24/2022  Component Date Value   Rheumatoid fact SerPl-aC* 09/24/2022 <10    Sed Rate 09/24/2022 2    Scleroderma (Scl-70) (EN* 09/24/2022 <1.0 NEG    Ribonucleic Protein(ENA)* 09/24/2022 <1.0 NEG    ENA SM Ab Ser-aCnc 09/24/2022 <1.0 NEG    SSA (Ro) (ENA) Antibody,* 09/24/2022 <1.0 NEG    ds DNA Ab 09/24/2022 1    C3 Complement 09/24/2022 124    C4 Complement 09/24/2022 21   Office Visit on 09/05/2022  Component Date Value   WBC 09/05/2022 6.9    RBC 09/05/2022 4.71    Hemoglobin 09/05/2022 14.1    HCT 09/05/2022 42.4    MCV 09/05/2022 90.2    MCHC 09/05/2022 33.2    RDW 09/05/2022 14.9    Platelets 09/05/2022 158.0    Neutrophils Relative % 09/05/2022 62.2    Lymphocytes Relative 09/05/2022 25.1    Monocytes Relative 09/05/2022 9.9    Eosinophils Relative 09/05/2022 2.2    Basophils Relative 09/05/2022 0.6    Neutro Abs 09/05/2022 4.3    Lymphs Abs 09/05/2022 1.7    Monocytes Absolute 09/05/2022 0.7    Eosinophils Absolute 09/05/2022 0.2    Basophils Absolute 09/05/2022 0.0    Ferritin 09/05/2022 29.4    Cholesterol 09/05/2022 123    Triglycerides 09/05/2022 160.0 (H)    HDL 09/05/2022 34.60 (L)    VLDL 09/05/2022 32.0    LDL Cholesterol 09/05/2022 57    Total CHOL/HDL Ratio 09/05/2022 4    NonHDL 09/05/2022 88.83    Sodium 09/05/2022 142    Potassium 09/05/2022 4.4    Chloride 09/05/2022 107    CO2  09/05/2022 28    Glucose, Bld 09/05/2022 101 (H)    BUN 09/05/2022 19    Creatinine, Ser 09/05/2022 1.17    Total Bilirubin 09/05/2022 0.4    Alkaline Phosphatase 09/05/2022 73    AST 09/05/2022 18    ALT 09/05/2022 15    Total Protein 09/05/2022 6.5    Albumin 09/05/2022 4.0    GFR 09/05/2022 59.00 (L)    Calcium 09/05/2022 8.6    VITD 09/05/2022 46.57    Microalb, Ur 09/05/2022 1.8    Creatinine,U 09/05/2022 97.2    Microalb Creat Ratio 09/05/2022 1.9    Magnesium 09/05/2022 1.9    Phosphorus 09/05/2022 2.1 (L)    Uric Acid, Serum 09/05/2022 5.7   Scanned Document on 06/20/2022  Component Date Value   [facility] Colonoscopy 06/20/2022 See Report (in chart)   Appointment on 06/04/2022  Component Date Value   Weight 06/04/2022 2,848    Height 06/04/2022 70    BP 06/04/2022 122/70    S' Lateral 06/04/2022 2.15    Area-P 1/2 06/04/2022 3.77    MV M vel 06/04/2022 3.23    MV Peak grad 06/04/2022 41.6    Est EF 06/04/2022 55 - 60%   Hospital Outpatient Visit on 05/20/2022  Component Date Value   Creatinine, Ser 05/20/2022 1.30 (H)   Office Visit on 05/08/2022  Component Date Value   WBC 05/08/2022 6.3    RBC 05/08/2022 4.85    Platelets 05/08/2022 171.0    Hemoglobin 05/08/2022 14.8    HCT 05/08/2022 43.7    MCV 05/08/2022 90.1    MCHC 05/08/2022 34.0    RDW 05/08/2022 14.6    Anti Nuclear Antibody (A* 05/08/2022 POSITIVE (A)    Anti-MPO Antibodies 05/08/2022 <0.2    Anti-PR3 Antibodies 05/08/2022 <0.2  C-ANCA 05/08/2022 <1:20    P-ANCA 05/08/2022 <1:20    Atypical pANCA 05/08/2022 <1:20    ANA Titer 1 05/08/2022 1:40 (H)    ANA Pattern 1 05/08/2022 Cytoplasmic (A)    ANA TITER 05/08/2022 1:80 (H)    ANA PATTERN 05/08/2022 Nuclear, Homogeneous (A)   No image results found. No results found.US Renal Result Date: 05/27/2022 CLINICAL DATA:  Cyst EXAM: RENAL / URINARY TRACT ULTRASOUND COMPLETE COMPARISON:  04/26/2022 FINDINGS: Right Kidney: Length: 11.6 cm. Echogenicity  within normal limits. No hydronephrosis. Midpole cyst measures 1.7 cm. This does not need follow up. Left Kidney: Length: 11.6 cm. Echogenicity within normal limits. No hydronephrosis. Lower pole cyst measures 1.9 cm. This does not need follow up. Bladder: Appears normal for degree of bladder distention. IMPRESSION: Small bilateral renal cysts.  Otherwise unremarkable study. Electronically Signed   By: Layla Maw M.D.   On: 05/27/2022 23:31   US ARTERIAL LOWER EXTREMITY DUPLEX BILATERAL Result Date: 05/25/2022 CLINICAL DATA:  Peripheral arterial disease, Buerger's disease, Raynaud's disease EXAM: BILATERAL LOWER EXTREMITY ARTERIAL DUPLEX SCAN TECHNIQUE: Gray-scale sonography as well as color Doppler and duplex ultrasound was performed to evaluate the arteries of both lower extremities including the common, superficial and profunda femoral arteries, popliteal artery and calf arteries. COMPARISON:  None Available. FINDINGS: Right Lower Extremity ABI: 1.3 Inflow: Normal common femoral arterial waveforms and velocities. No evidence of inflow (aortoiliac) disease. Outflow: Normal profunda femoral, superficial femoral and popliteal arterial waveforms and velocities. No focal elevation of the PSV to suggest stenosis. Runoff: Normal posterior and anterior tibial arterial waveforms and velocities. Vessels are patent to the ankle. Left Lower Extremity ABI: 1.3 Inflow: Normal common femoral arterial waveforms and velocities. No evidence of inflow (aortoiliac) disease. Outflow: Normal profunda femoral, superficial femoral and popliteal arterial waveforms and velocities. No focal elevation of the PSV to suggest stenosis. Runoff: Normal posterior and anterior tibial arterial waveforms and velocities. Vessels are patent to the ankle. IMPRESSION: Normal exam no evidence of hemodynamically significant peripheral arterial disease. Signed, Sterling Big, MD, RPVI [facility]  [facility] Electronically Signed   By: Malachy Moan M.D.   On: 05/25/2022 05:50   US ARTERIAL ABI (SCREENING LOWER EXTREMITY) Result Date: 05/25/2022 CLINICAL DATA:  Peripheral arterial disease, Buerger's disease, Raynaud's disease EXAM: BILATERAL LOWER EXTREMITY ARTERIAL DUPLEX SCAN TECHNIQUE: Gray-scale sonography as well as color Doppler and duplex ultrasound was performed to evaluate the arteries of both lower extremities including the common, superficial and profunda femoral arteries, popliteal artery and calf arteries. COMPARISON:  None Available. FINDINGS: Right Lower Extremity ABI: 1.3 Inflow: Normal common femoral arterial waveforms and velocities. No evidence of inflow (aortoiliac) disease. Outflow: Normal profunda femoral, superficial femoral and popliteal arterial waveforms and velocities. No focal elevation of the PSV to suggest stenosis. Runoff: Normal posterior and anterior tibial arterial waveforms and velocities. Vessels are patent to the ankle. Left Lower Extremity ABI: 1.3 Inflow: Normal common femoral arterial waveforms and velocities. No evidence of inflow (aortoiliac) disease. Outflow: Normal profunda femoral, superficial femoral and popliteal arterial waveforms and velocities. No focal elevation of the PSV to suggest stenosis. Runoff: Normal posterior and anterior tibial arterial waveforms and velocities. Vessels are patent to the ankle. IMPRESSION: Normal exam no evidence of hemodynamically significant peripheral arterial disease. Signed, Sterling Big, MD, RPVI [facility] Electronically Signed   By: Malachy Moan M.D.   On: 05/25/2022 05:50      Results LABS Vitamin B12:  normal (03/2023) 24-hour urine heavy metals: no heavy metal poisoning (03/2023) Thyroid function tests: normal (03/2023) Glucose: prediabetes (03/2023) Renal function: stage 2 mild chronic kidney disease (03/2023) Assessment &  Plan Fatigue, unspecified type Fatigue Intermittent fatigue is likely due to high activity levels and suboptimal sleep. Previous tests for heavy metal poisoning, autoimmune disease, and thyroid function were negative. Mild prediabetes is noted but not the primary cause. CPAP is used for sleep apnea and may need further evaluation. Potential genetic and cardiac factors were discussed. Continue using CPAP and follow up with a sleep specialist in February. Monitor for new symptoms or changes in sleep quality. Prediabetes Prediabetes Mild prediabetes has been identified. There is a reduced intake of sugary drinks, though one Pepsi is consumed daily. Education on reducing carbohydrate intake to prevent diabetes progression and switching to alternatives like Pepsi Zero was provided. Continue reducing sugary drink intake and monitor blood glucose levels regularly. Chronic kidney disease, stage 2 (mild) Chronic Kidney Disease (Stage 2) Chronic kidney disease has improved from stage 3 to stage 2 and is currently well-managed. Continue current management and monitoring. Neuropathy Neuropathy Likely secondary to chemical exposure from oil-based primer. Symptoms have improved but are still present. The potential benefit of Lyrica for neuropathy and back pain was discussed. Resume Lyrica for neuropathy. Monitor symptoms and adjust treatment as necessary. PDMP reviewed during this encounter.  Bronchiectasis without complication (HCC) Neuropathy Likely secondary to chemical exposure from oil-based primer. Symptoms have improved but are still present. The condition is well-managed with no current lung issues. Regular exercise is possible without significant respiratory symptoms. No further follow-up is required at this time. Chest discomfort Following with cardiology, taking good medication(s), advised patient to go to emergency room if it ever flares  RLS (restless legs syndrome) Restless Legs  Syndrome Symptoms persist but have improved, primarily at night. A previous neurologist consultation did not result in ongoing follow-up. The potential benefit of Lyrica for neuropathy and restless legs syndrome was discussed. Resume Lyrica for neuropathy and restless legs syndrome. Monitor symptoms and consider re-evaluation if persistent.  General Health Maintenance Dietary recommendations and exercise were discussed. Regular physical activity is engaged in. Healthier cooking oils like avocado oil and the benefits of fish oil supplements for cardiovascular health were discussed. Continue regular physical activity, consider healthier cooking oils such as avocado oil, and monitor and manage cardiovascular health with current medications.  Follow-up Schedule a follow-up appointment in three months and print the updated medication list at the front desk.     Orders Placed During this Encounter:  No orders of the defined types were placed in this encounter.  Meds ordered this encounter  Medications   pregabalin (LYRICA) 100 MG capsule    Sig: Take 1 capsule (100 mg total) by mouth daily. Fill when due    Dispense:  90 capsule    Refill:  1   Medical Decision Making: 2 or more stable chronic illnesses Prescription drug management       This document was synthesized by artificial intelligence (Abridge) using HIPAA-compliant recording of the clinical interaction;   We discussed the use of AI scribe software for clinical note transcription with the patient, who gave verbal consent to proceed.    Additional Info: This encounter employed state-of-the-art, real-time, collaborative documentation. The patient actively reviewed and assisted in updating their electronic medical record on a shared screen, ensuring transparency and facilitating joint problem-solving for the problem list, overview, and plan. This approach promotes accurate, informed care. The treatment  plan was discussed and reviewed in  detail, including medication safety, potential side effects, and all patient questions. We confirmed understanding and comfort with the plan. Follow-up instructions were established, including contacting the office for any concerns, returning if symptoms worsen, persist, or new symptoms develop, and precautions for potential emergency department visits.

## 2023-05-02 ENCOUNTER — Other Ambulatory Visit: Payer: Self-pay

## 2023-05-02 DIAGNOSIS — G250 Essential tremor: Secondary | ICD-10-CM

## 2023-05-02 MED ORDER — PRIMIDONE 50 MG PO TABS: ORAL_TABLET | ORAL | 3 refills | Status: DC

## 2023-05-02 NOTE — Telephone Encounter (Signed)
Sent refill to requested pharmacy

## 2023-05-17 ENCOUNTER — Encounter: Payer: Self-pay | Admitting: Internal Medicine

## 2023-05-17 ENCOUNTER — Ambulatory Visit (INDEPENDENT_AMBULATORY_CARE_PROVIDER_SITE_OTHER): Payer: HMO | Admitting: Internal Medicine

## 2023-05-17 VITALS — BP 120/62 | HR 67 | Temp 98.4°F | Ht 69.5 in | Wt 175.6 lb

## 2023-05-17 DIAGNOSIS — H6611 Chronic tubotympanic suppurative otitis media, right ear: Secondary | ICD-10-CM

## 2023-05-17 DIAGNOSIS — G2581 Restless legs syndrome: Secondary | ICD-10-CM | POA: Diagnosis not present

## 2023-05-17 DIAGNOSIS — M2560 Stiffness of unspecified joint, not elsewhere classified: Secondary | ICD-10-CM | POA: Diagnosis not present

## 2023-05-17 DIAGNOSIS — L989 Disorder of the skin and subcutaneous tissue, unspecified: Secondary | ICD-10-CM

## 2023-05-17 DIAGNOSIS — S0991XA Unspecified injury of ear, initial encounter: Secondary | ICD-10-CM | POA: Diagnosis not present

## 2023-05-17 MED ORDER — NEOMYCIN-POLYMYXIN-HC 3.5-10000-1 OT SOLN: 3.0000 [drp] | Freq: Three times a day (TID) | OTIC | 0 refills | Status: DC

## 2023-05-17 MED ORDER — TRIAMCINOLONE ACETONIDE 0.1 % EX CREA: 1.0000 | TOPICAL_CREAM | Freq: Two times a day (BID) | CUTANEOUS | 0 refills | Status: DC

## 2023-05-17 MED ORDER — AMOXICILLIN-POT CLAVULANATE 875-125 MG PO TABS: 1.0000 | ORAL_TABLET | Freq: Two times a day (BID) | ORAL | 0 refills | Status: DC

## 2023-05-17 NOTE — Patient Instructions (Signed)
 VISIT SUMMARY:  Today, we addressed several concerns including bleeding from your left ear, dry skin behind your ear, and restless legs. We discussed the likely causes and provided treatment plans for each issue. Additionally, we reviewed your general health and made recommendations for maintaining your well-being.  YOUR PLAN:  -EXTERNAL AUDITORY CANAL ABRASION: This is a small scratch in the ear canal, likely caused by using a Q-tip. We have prescribed Cortisporin  otic solution to prevent infection and reduce inflammation. Please avoid inserting objects into your ear canal and reassess if symptoms do not improve in 1-2 weeks.  -OTITIS MEDIA WITH EFFUSION: This is a condition where there is fluid behind the eardrum, which can lead to infection. Although you do not have significant symptoms, we have prescribed Augmentin  to treat any potential infection.  -DRY SKIN/CHAPPING BEHIND EAR: The skin behind your ear is dry and cracking, likely due to chapping. We have prescribed triamcinolone  cream for 14 days. If there is no improvement after 14 days, we may consider an antifungal treatment.  -RESTLESS LEGS SYNDROME: This is a chronic condition that causes discomfort in your legs, especially at night. We will continue with your current Lyrica  dosage and have referred you to physical therapy for stretching exercises. Tai Chi is also recommended for balance and flexibility. If physical therapy is not effective, we may consider increasing your Lyrica  dose.  INSTRUCTIONS:  Please follow up in 1-2 weeks if your symptoms do not improve. Your routine follow-up is scheduled for July 31, 2023.   It was a pleasure seeing you today! Your health and satisfaction are our top priorities.  Bernardino Cone, MD  Your Providers PCP: Cone Bernardino MATSU, MD,  [phone]) Referring Provider: Cone Bernardino MATSU, MD,  [phone]) Care Team Provider: Evonnie Asberry RAMAN, DO,  [phone]) Care Team Provider: Neysa Reggy BIRCH, MD,  [phone]) Care Team Provider: Joshua Barters Care Team Provider: Gerome Charleston, MD,  [phone]) Care Team Provider: Gretel Ozell PARAS, DPM Care Team Provider: Donnald Charleston, MD,  [phone]) Care Team Provider: Leslee Reusing, MD,  [phone]) Care Team Provider: Leslee Reusing, MD,  [phone]) Care Team Provider: Livingston Rigg, MD Care Team Provider: Porter Andrez SAUNDERS, PA-C Care Team Provider: Lonni Slain, MD,  [phone]) Care Team Provider: Dianna Specking, MD,  [phone]) Care Team Provider: Jeannetta Lonni ORN, MD,  [phone])     NEXT STEPS: [x]  Early Intervention: Schedule sooner appointment, call our on-call services, or go to emergency room if there is any significant Increase in pain or discomfort New or worsening symptoms Sudden or severe changes in your health [x]  Flexible Follow-Up: We recommend a No follow-ups on file. for optimal routine care. This allows for progress monitoring and treatment adjustments. [x]  Preventive Care: Schedule your annual preventive care visit! It's typically covered by insurance and helps identify potential health issues early. [x]  Lab & X-ray Appointments: Incomplete tests scheduled today, or call to schedule. X-rays: [facility] (M-F, 8:30am-noon or 1pm-5pm). [x]  Medical Information Release: Sign a release form at front desk to obtain relevant medical information we don't have.  MAKING THE MOST OF OUR FOCUSED 20 MINUTE APPOINTMENTS: [x]   Clearly state your top concerns at the beginning of the visit to focus our discussion [x]   If you anticipate you will need more time, please inform the front desk during scheduling - we can book multiple appointments in the same week. [x]   If you have transportation problems- use our convenient video appointments or ask about transportation support. [x]   We  can get down to business faster if you use MyChart to update information  before the visit and submit non-urgent questions before your visit. Thank you for taking the time to provide details through MyChart.  Let our nurse know and she can import this information into your encounter documents.  Arrival and Wait Times: [x]   Arriving on time ensures that everyone receives prompt attention. [x]   Early morning (8a) and afternoon (1p) appointments tend to have shortest wait times. [x]   Unfortunately, we cannot delay appointments for late arrivals or hold slots during phone calls.  Getting Answers and Following Up [x]   Simple Questions & Concerns: For quick questions or basic follow-up after your visit, reach us  at [phone] or MyChart messaging. [x]   Complex Concerns: If your concern is more complex, scheduling an appointment might be best. Discuss this with the staff to find the most suitable option. [x]   Lab & Imaging Results: We'll contact you directly if results are abnormal or you don't use MyChart. Most normal results will be on MyChart within 2-3 business days, with a review message from Dr. Jesus. Haven't heard back in 2 weeks? Need results sooner? Contact us  at [phone]. [x]   Referrals: Our referral coordinator will manage specialist referrals. The specialist's office should contact you within 2 weeks to schedule an appointment. Call us  if you haven't heard from them after 2 weeks.  Staying Connected [x]   MyChart: Activate your MyChart for the fastest way to access results and message us . See the last page of this paperwork for instructions on how to activate.  Bring to Your Next Appointment [x]   Medications: Please bring all your medication bottles to your next appointment to ensure we have an accurate record of your prescriptions. [x]   Health Diaries: If you're monitoring any health conditions at home, keeping a diary of your readings can be very helpful for discussions at your next appointment.  Billing [x]   X-ray & Lab Orders: These are billed by  separate companies. Contact the invoicing company directly for questions or concerns. [x]   Visit Charges: Discuss any billing inquiries with our administrative services team.  Your Satisfaction Matters [x]   Share Your Experience: We strive for your satisfaction! If you have any complaints, or preferably compliments, please let Dr. Jesus know directly or contact our Practice Administrators, Manuelita Rubin or [employer], by asking at the front desk.   Reviewing Your Records [x]   Review this early draft of your clinical encounter notes below and the final encounter summary tomorrow on MyChart after its been completed.  All orders placed so far are visible here: Injury of external auditory canal, initial encounter -     Neomycin -Polymyxin-HC; Place 3 drops into the left ear 3 (three) times daily for 3 days.  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  Dispense: 20 tablet; Refill: 0  Stiffness in joint -     Ambulatory referral to Physical Therapy  Cracking skin -     Triamcinolone  Acetonide; Apply 1 Application topically 2 (two) times daily. Up to 14 days use  Dispense: 30 g; Refill: 0

## 2023-05-17 NOTE — Progress Notes (Signed)
 ==============================  [facility]: [phone]   -- Medical Office Visit --  Patient: Stephen Anderson      Age: 83 y.o.       Sex:  male  Date:   05/17/2023 Today's Healthcare Provider: Bernardino KANDICE Cone, MD  ==============================   CHIEF COMPLAINT: Eardrum issue (Since the weekend, no pain or issue with hearing. Itching in left ear.)  SUBJECTIVE: Background This is a 83 y.o. 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  History of Present Illness Stephen Anderson is an 83 year old male who presents with bleeding from the left ear.  He noticed his left ear was 'sort of stopped up' this morning and observed blood when cleaning it with a Q-tip. He describes having 'scaly stuff' in the ear and admits to using a Q-tip to clean it, which led to bleeding. He has not worn his hearing aids since the bleeding started. He mentions recent tree pruning, which may have contributed to the issue. No current bleeding  is noted, but the area is not scabbed over.  He reports issues with dry, cracking skin behind his ear, which he describes as 'chapped.' He has been using Gold Bond Medicare cream, which he feels worsens the condition. He mentions that he can 'take my fingernail and pull it off,' indicating the severity of the dryness and cracking.  He experiences restless legs, which he describes as 'bothering you a lot' and 'restless.' He is currently taking Lyrica , having been switched from gabapentin , but he feels that his legs are still 'tight' and 'super tight.' He mentions difficulty touching his toes and putting on socks due to stiffness. He engages in walking and exercises but feels his muscles are 'too tense.' He has a history of taking gabapentin  at 1800 mg, which was changed to Lyrica  at 150 mg, taken twice or three times a day. He notes that the Lyrica  initially helped but now feels that his legs are getting tighter.  [facility] and [facility] his [facility]. He has been tight since his teens and does not engage in daily exercises to improve flexibility.  Current Outpatient Medications on File Prior to Visit  Medication Sig   Acetaminophen (TYLENOL ARTHRITIS EXT RELIEF PO) Take 1 tablet by mouth every other day.   Alpha-Lipoic Acid 300 MG TABS Take 2 tablets (600 mg total) by mouth daily at 6 (six) AM.   Ascorbic Acid (VITAMIN C PO) Take 500 mg by mouth daily.   aspirin 81 MG tablet Take 81 mg by mouth every other day.    atorvastatin  (LIPITOR) 40 MG tablet Take 1 tablet (40 mg total) by mouth at bedtime.   caffeine 200 MG  TABS tablet Take 200 mg by mouth in the morning and at bedtime. STAY AWAKE   celecoxib  (CELEBREX ) 200 MG capsule Take 1 capsule (200 mg total) by mouth daily.   Cholecalciferol  (VITAMIN D -3) 125 MCG (5000 UT) TABS Take 1 tablet by mouth daily.   cilostazol  (PLETAL ) 50 MG tablet TAKE 1 TABLET BY MOUTH TWICE A DAY   Clobetasol Propionate 0.05 %  shampoo    Cyanocobalamin  (B-12 PO) Take 1 tablet by mouth daily.   diclofenac  Sodium (VOLTAREN ) 1 % GEL Apply 2 g topically in the morning and at bedtime. Every morning and every night.   Ferrous Sulfate (IRON PO) Take 65 mg by mouth daily.   fluticasone  (CUTIVATE ) 0.05 % cream    fluticasone  (FLONASE ) 50 MCG/ACT nasal spray Place 1 spray into both nostrils daily. (Patient taking differently: Place 1 spray into both nostrils daily. As needed.)   ipratropium (ATROVENT ) 0.06 % nasal spray Place 1-2 sprays into both nostrils 4 (four) times daily. As needed for nasal congestion   ketoconazole  (NIZORAL ) 2 % shampoo Apply to affected area- let sit 3-5 minutes before rinsing   lidocaine  (XYLOCAINE ) 5 % ointment Apply 1 Application topically as needed.   MAGNESIUM PO Take 400 mg by mouth daily.   NIFEdipine  (PROCARDIA  XL) 60 MG 24 hr tablet Take 1 tablet (60 mg total) by mouth daily. Dose increased to help improve blood flow and energy. (Patient taking differently: Take 60 mg by mouth daily. Dose increased to help improve blood flow and energy. Patient reported taking 30 mg.)   NIFEdipine  (PROCARDIA -XL/NIFEDICAL-XL) 30 MG 24 hr tablet TAKE 1 TABLET BY MOUTH DAILY   NON FORMULARY CPAP   omega-3 acid ethyl esters (LOVAZA ) 1 g capsule Take 2 capsules (2 g total) by mouth 2 (two) times daily.   omega-3 acid ethyl esters (LOVAZA ) 1 g capsule Take 2 capsules (2 g total) by mouth in the morning, at noon, and at bedtime.   pantoprazole  (PROTONIX ) 40 MG tablet Take 1 tablet (40 mg total) by mouth daily.   pregabalin  (LYRICA ) 100 MG capsule Take 1 capsule (100 mg total) by mouth daily. Fill when due   primidone  (MYSOLINE ) 50 MG tablet TAKE ONE TABLET BY MOUTH EVERY MORNING AND ONE TABLET EVERY EVENING   UNABLE TO FIND 100 mg in the morning and at bedtime. Out back oil.   No current facility-administered medications on file prior to visit.  There are no discontinued medications.    Objective   Physical Exam      05/17/2023    8:42 AM 05/01/2023    8:28 AM 03/27/2023    7:48 AM  Vitals with BMI  Height 5' 9.5 5' 9.5 5' 9.5  Weight 175 lbs 10 oz 176 lbs 3 oz 173 lbs 3 oz  BMI 25.57 25.66 25.22  Systolic 120 118 880  Diastolic 62 68 60  Pulse 67 56 64   Wt Readings from Last 10 Encounters:  05/17/23 175 lb 9.6 oz (79.7 kg)  05/01/23 176 lb 3.2 oz (79.9 kg)  03/27/23 173 lb 3.2 oz (78.6 kg)  01/16/23 173 lb (78.5 kg)  12/06/22 174 lb 12.8 oz (79.3 kg)  10/19/22 176 lb 3.2 oz (79.9 kg)  09/24/22 176 lb (79.8 kg)  09/05/22 176 lb 6.4 oz (80 kg)  06/07/22 178 lb 9.6 oz (81 kg)  06/04/22 178 lb (80.7 kg)   Vital signs reviewed.  Nursing notes reviewed. Weight trend reviewed. Abnormalities and Problem-Specific physical exam findings:  HEENT:  Blood in left external auditory canal, ulcerated skin on bottom of canal, not scabbed, pus behind right eardrum, dry, cracking skin behind the ear. MUSCULOSKELETAL: Muscle tension in lower back, stiffness, limited range of motion in hip flexion to approximately 60 degrees. General Appearance:  No acute distress appreciable.   Well-groomed, healthy-appearing male.  Well proportioned with no abnormal fat distribution.  Good muscle tone. Pulmonary:  Normal work of breathing at rest, no respiratory distress apparent. SpO2: 97 %  Musculoskeletal: All extremities are intact.  Neurological:  Awake, alert, oriented, and engaged.  No obvious focal neurological deficits or cognitive impairments.  Sensorium seems unclouded.   Speech is clear and coherent with logical content. Psychiatric:  Appropriate mood, pleasant and cooperative demeanor, thoughtful and engaged during the exam    No results found for any visits on 05/17/23. Office Visit on 03/27/2023  Component Date Value   Vitamin B-12 03/27/2023 1,492 (H)    Folate 03/27/2023 16.0    Hgb A1c MFr Bld 03/27/2023 5.9    Ferritin 03/27/2023 53.8    Sodium 03/27/2023 142    Potassium 03/27/2023 4.3     Chloride 03/27/2023 105    CO2 03/27/2023 30    Glucose, Bld 03/27/2023 109 (H)    BUN 03/27/2023 21    Creatinine, Ser 03/27/2023 1.03    Total Bilirubin 03/27/2023 0.5    Alkaline Phosphatase 03/27/2023 68    AST 03/27/2023 17    ALT 03/27/2023 14    Total Protein 03/27/2023 6.8    Albumin 03/27/2023 4.5    GFR 03/27/2023 68.48    Calcium  03/27/2023 8.9    WBC 03/27/2023 6.3    RBC 03/27/2023 4.74    Hemoglobin 03/27/2023 14.6    HCT 03/27/2023 43.8    MCV 03/27/2023 92.3    MCHC 03/27/2023 33.3    RDW 03/27/2023 14.0    Platelets 03/27/2023 185.0    Neutrophils Relative % 03/27/2023 60.8    Lymphocytes Relative 03/27/2023 27.2    Monocytes Relative 03/27/2023 9.0    Eosinophils Relative 03/27/2023 2.3    Basophils Relative 03/27/2023 0.7    Neutro Abs 03/27/2023 3.8    Lymphs Abs 03/27/2023 1.7    Monocytes Absolute 03/27/2023 0.6    Eosinophils Absolute 03/27/2023 0.1    Basophils Absolute 03/27/2023 0.0    Cholesterol 03/27/2023 123    Triglycerides 03/27/2023 81.0    HDL 03/27/2023 41.00    VLDL 03/27/2023 16.2    LDL Cholesterol 03/27/2023 65    Total CHOL/HDL Ratio 03/27/2023 3    NonHDL 03/27/2023 81.51    VITD 03/27/2023 34.36    Anti Nuclear Antibody (A* 03/27/2023 Negative    Rheumatoid fact SerPl-aC* 03/27/2023 <10    T3 Uptake 03/27/2023 28    T4, Total 03/27/2023 7.5    Free Thyroxine Index 03/27/2023 2.1    TSH 03/27/2023 1.05    Arsenic, 24H Ur 04/04/2023 <10    Lead, 24 hr urine 04/04/2023 <10    Mercury, 24H Ur 04/04/2023 <4   Office Visit on 09/24/2022  Component Date Value   Rheumatoid fact SerPl-aC* 09/24/2022 <10    Sed Rate 09/24/2022 2    Scleroderma (Scl-70) (EN* 09/24/2022 <1.0 NEG    Ribonucleic Protein(ENA)* 09/24/2022 <1.0 NEG    ENA SM Ab Ser-aCnc 09/24/2022 <1.0 NEG    SSA (Ro) (ENA) Antibody,* 09/24/2022 <1.0 NEG    ds DNA Ab 09/24/2022 1    C3 Complement 09/24/2022 124    C4 Complement 09/24/2022 21  Office Visit on  09/05/2022  Component Date Value   WBC 09/05/2022 6.9    RBC 09/05/2022 4.71    Hemoglobin 09/05/2022 14.1    HCT 09/05/2022 42.4    MCV 09/05/2022 90.2    MCHC 09/05/2022 33.2    RDW 09/05/2022 14.9    Platelets 09/05/2022 158.0    Neutrophils Relative % 09/05/2022 62.2    Lymphocytes Relative 09/05/2022 25.1    Monocytes Relative 09/05/2022 9.9    Eosinophils Relative 09/05/2022 2.2    Basophils Relative 09/05/2022 0.6    Neutro Abs 09/05/2022 4.3    Lymphs Abs 09/05/2022 1.7    Monocytes Absolute 09/05/2022 0.7    Eosinophils Absolute 09/05/2022 0.2    Basophils Absolute 09/05/2022 0.0    Ferritin 09/05/2022 29.4    Cholesterol 09/05/2022 123    Triglycerides 09/05/2022 160.0 (H)    HDL 09/05/2022 34.60 (L)    VLDL 09/05/2022 32.0    LDL Cholesterol 09/05/2022 57    Total CHOL/HDL Ratio 09/05/2022 4    NonHDL 09/05/2022 88.83    Sodium 09/05/2022 142    Potassium 09/05/2022 4.4    Chloride 09/05/2022 107    CO2 09/05/2022 28    Glucose, Bld 09/05/2022 101 (H)    BUN 09/05/2022 19    Creatinine, Ser 09/05/2022 1.17    Total Bilirubin 09/05/2022 0.4    Alkaline Phosphatase 09/05/2022 73    AST 09/05/2022 18    ALT 09/05/2022 15    Total Protein 09/05/2022 6.5    Albumin 09/05/2022 4.0    GFR 09/05/2022 59.00 (L)    Calcium  09/05/2022 8.6    VITD 09/05/2022 46.57    Microalb, Ur 09/05/2022 1.8    Creatinine,U 09/05/2022 97.2    Microalb Creat Ratio 09/05/2022 1.9    Magnesium 09/05/2022 1.9    Phosphorus 09/05/2022 2.1 (L)    Uric Acid, Serum 09/05/2022 5.7   Scanned Document on 06/20/2022  Component Date Value   [facility] Colonoscopy 06/20/2022 See Report (in chart)   Appointment on 06/04/2022  Component Date Value   Weight 06/04/2022 2,848    Height 06/04/2022 70    BP 06/04/2022 122/70    S' Lateral 06/04/2022 2.15    Area-P 1/2 06/04/2022 3.77    MV M vel 06/04/2022 3.23    MV Peak grad 06/04/2022 41.6    Est EF 06/04/2022 55 - 60%   Hospital Outpatient  Visit on 05/20/2022  Component Date Value   Creatinine, Ser 05/20/2022 1.30 (H)   No image results found. No results found.US  Renal Result Date: 05/27/2022 CLINICAL DATA:  Cyst EXAM: RENAL / URINARY TRACT ULTRASOUND COMPLETE COMPARISON:  04/26/2022 FINDINGS: Right Kidney: Length: 11.6 cm. Echogenicity within normal limits. No hydronephrosis. Midpole cyst measures 1.7 cm. This does not need follow up. Left Kidney: Length: 11.6 cm. Echogenicity within normal limits. No hydronephrosis. Lower pole cyst measures 1.9 cm. This does not need follow up. Bladder: Appears normal for degree of bladder distention. IMPRESSION: Small bilateral renal cysts.  Otherwise unremarkable study. Electronically Signed   By: Fonda Field M.D.   On: 05/27/2022 23:31   US  ARTERIAL LOWER EXTREMITY DUPLEX BILATERAL Result Date: 05/25/2022 CLINICAL DATA:  Peripheral arterial disease, Buerger's disease, Raynaud's disease EXAM: BILATERAL LOWER EXTREMITY ARTERIAL DUPLEX SCAN TECHNIQUE: Gray-scale sonography as well as color Doppler and duplex ultrasound was performed to evaluate the arteries of both lower extremities including the common, superficial and profunda femoral arteries, popliteal artery and calf arteries. COMPARISON:  None Available. FINDINGS: Right  Lower Extremity ABI: 1.3 Inflow: Normal common femoral arterial waveforms and velocities. No evidence of inflow (aortoiliac) disease. Outflow: Normal profunda femoral, superficial femoral and popliteal arterial waveforms and velocities. No focal elevation of the PSV to suggest stenosis. Runoff: Normal posterior and anterior tibial arterial waveforms and velocities. Vessels are patent to the ankle. Left Lower Extremity ABI: 1.3 Inflow: Normal common femoral arterial waveforms and velocities. No evidence of inflow (aortoiliac) disease. Outflow: Normal profunda femoral, superficial femoral and popliteal arterial waveforms and velocities. No focal elevation of the PSV to suggest  stenosis. Runoff: Normal posterior and anterior tibial arterial waveforms and velocities. Vessels are patent to the ankle. IMPRESSION: Normal exam no evidence of hemodynamically significant peripheral arterial disease. Signed, Wilkie LOIS Lent, MD, RPVI [facility] Electronically Signed   By: Wilkie Lent M.D.   On: 05/25/2022 05:50   US  ARTERIAL ABI (SCREENING LOWER EXTREMITY) Result Date: 05/25/2022 CLINICAL DATA:  Peripheral arterial disease, Buerger's disease, Raynaud's disease EXAM: BILATERAL LOWER EXTREMITY ARTERIAL DUPLEX SCAN TECHNIQUE: Gray-scale sonography as well as color Doppler and duplex ultrasound was performed to evaluate the arteries of both lower extremities including the common, superficial and profunda femoral arteries, popliteal artery and calf arteries. COMPARISON:  None Available. FINDINGS: Right Lower Extremity ABI: 1.3 Inflow: Normal common femoral arterial waveforms and velocities. No evidence of inflow (aortoiliac) disease. Outflow: Normal profunda femoral, superficial femoral and popliteal arterial waveforms and velocities. No focal elevation of the PSV to suggest stenosis. Runoff: Normal posterior and anterior tibial arterial waveforms and velocities. Vessels are patent to the ankle. Left Lower Extremity ABI: 1.3 Inflow: Normal common femoral arterial waveforms and velocities. No evidence of inflow (aortoiliac) disease. Outflow: Normal profunda femoral, superficial femoral and popliteal arterial waveforms and velocities. No focal elevation of the PSV to suggest stenosis. Runoff: Normal posterior and anterior tibial arterial waveforms and velocities. Vessels are patent to the ankle. IMPRESSION: Normal exam no evidence of hemodynamically significant peripheral arterial disease. Signed, Wilkie LOIS Lent, MD, RPVI [facility] Electronically Signed   By: Wilkie Lent M.D.   On: 05/25/2022 05:50       Assessment & Plan Injury of external auditory canal, initial encounter External Auditory Canal Abrasion Bleeding from the left ear is due to a shallow abrasion in the external auditory canal, likely from Q-tip use. There is a potential for infection. Start neomycin , polymyxin, hydrocortisone (Cortisporin ) otic solution for 14 days. Advise against inserting objects into the ear canal. Reassess if symptoms do not improve in 1-2 weeks. RLS (restless legs syndrome) Restless Legs Syndrome This chronic condition causes significant discomfort, especially at night. Current Lyrica  (pregabalin ) 150 mg twice daily is less effective. Muscle tightness and stiffness are noted. Continue Lyrica  150 mg twice daily. Refer to physical therapy for stretching exercises and recommend Tai Chi for balance and flexibility. Consider increasing the Lyrica  dose if physical therapy is not effective. 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 Prescribe Augmentin  (amoxicillin /clavulanate). Stiffness in joint Arrange physical therapy to reduce fall risk and hopefully restless legs syndrome as well Cracking skin Dry Skin/Chapping Behind Ear The skin behind the ear is dry and cracking, likely due to chapping. Gold Bond cream has been ineffective. Prescribe triamcinolone  cream for 14 days. Consider antifungal treatment if no improvement after 14 days. Follow-up Follow up in  1-2 weeks if symptoms do not improve. Routine follow-up is scheduled for July 31, 2023.     Orders Placed During this Encounter:   Orders Placed This Encounter  Procedures   Ambulatory referral to Physical Therapy    Referral Priority:   Routine    Referral Type:   Physical Medicine    Referral Reason:   Specialty Services Required     Requested Specialty:   Physical Therapy    Number of Visits Requested:   1   Meds ordered this encounter  Medications   neomycin -polymyxin-hydrocortisone (CORTISPORIN ) OTIC solution    Sig: Place 3 drops into the left ear 3 (three) times daily for 3 days.    Dispense:  10 mL    Refill:  0   triamcinolone  cream (KENALOG ) 0.1 %    Sig: Apply 1 Application topically 2 (two) times daily. Up to 14 days use    Dispense:  30 g    Refill:  0   amoxicillin -clavulanate (AUGMENTIN ) 875-125 MG tablet    Sig: Take 1 tablet by mouth 2 (two) times daily.    Dispense:  20 tablet    Refill:  0       This document was synthesized by artificial intelligence ([employer]bridge) using HIPAA-compliant recording of the clinical interaction;   We discussed the use of AI scribe software for clinical note transcription with the patient, who gave verbal consent to proceed.    Additional Info: This encounter employed state-of-the-art, real-time, collaborative documentation. The patient actively reviewed and assisted in updating their electronic medical record on a shared screen, ensuring transparency and facilitating joint problem-solving for the problem list, overview, and plan. This approach promotes accurate, informed care. The treatment plan was discussed and reviewed in detail, including medication safety, potential side effects, and all patient questions. We confirmed understanding and comfort with the plan. Follow-up instructions were established, including contacting the office for any concerns, returning if symptoms worsen, persist, or new symptoms develop, and precautions for potential emergency department visits.

## 2023-05-17 NOTE — Assessment & Plan Note (Signed)
 Restless Legs Syndrome This chronic condition causes significant discomfort, especially at night. Current Lyrica  (pregabalin ) 150 mg twice daily is less effective. Muscle tightness and stiffness are noted. Continue Lyrica  150 mg twice daily. Refer to physical therapy for stretching exercises and recommend Tai Chi for balance and flexibility. Consider increasing the Lyrica  dose if physical therapy is not effective.

## 2023-06-05 ENCOUNTER — Encounter: Payer: Self-pay | Admitting: Physical Therapy

## 2023-06-05 ENCOUNTER — Ambulatory Visit: Payer: HMO | Admitting: Physical Therapy

## 2023-06-05 DIAGNOSIS — M79604 Pain in right leg: Secondary | ICD-10-CM | POA: Diagnosis not present

## 2023-06-05 DIAGNOSIS — M79605 Pain in left leg: Secondary | ICD-10-CM

## 2023-06-05 DIAGNOSIS — M6281 Muscle weakness (generalized): Secondary | ICD-10-CM | POA: Diagnosis not present

## 2023-06-05 NOTE — Therapy (Signed)
 OUTPATIENT PHYSICAL THERAPY LOWER EXTREMITY EVALUATION   Patient Name: Stephen Anderson MRN: [id number] DOB:07/01/1940, 83 y.o., male Today's Date: 06/05/2023  END OF SESSION:  PT End of Session - 06/05/23 0955     Visit Number 1    Number of Visits 16    Date for PT Re-Evaluation 07/31/23    Authorization Type HTA    PT Start Time 0803    PT Stop Time 0845    PT Time Calculation (min) 42 min    Activity Tolerance Patient tolerated treatment well    Behavior During Therapy [facility] for tasks assessed/performed             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)   Chest discomfort 04/05/2022   Associated with fatigue worsening 02/2022   Essential tremor    GERD (gastroesophageal reflux disease) 02/24/2018   High risk medication use 04/05/2022   Lyrica for neuropathy     History of melanoma 03/26/2018   S/p excision on back   Hypercholesteremia    Iron deficiency anemia 06/07/2022   Lab Results  Component  Value  Date/Time     HGB  14.8  05/08/2022 09:12 AM     HGB  13.9  03/06/2022 08:44 AM     HGB  11.9 (L)  10/26/2021 08:37 AM     HGB  13.1  06/27/2021 08:58 AM     HGB  14.8  05/24/2020 08:17 AM         Mixed hyperlipidemia 02/24/2018   Peripheral neuropathy    RUQ abdominal pain 05/08/2022   We did ultrasound abdomen 04/26/22 to evaluate pain he has where it feels like right upper quadrant of abdomen pain flared by leaning forward feels like liver tip rolling over but that was negative   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   well diff-right hand- txpbx   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 ([id number])   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   in situ-right parotid area   Unintentional weight loss 04/05/2022   Wt Readings from Last 20 Encounters: 04/05/22 170 lb (77.1 kg) 03/06/22 178  lb 12.8 oz (81.1 kg) 10/26/21 168 lb (76.2 kg) 09/21/21 176 lb 2 oz (79.9 kg) 07/05/21 182 lb 6.4 oz (82.7 kg) 06/27/21 179 lb (81.2 kg) 06/01/21 178 lb 12.8 oz (81.1 kg) 01/10/21 174 lb 6.4 oz (79.1 kg) 12/19/20 171 lb (77.6 kg) 08/29/20 178 lb 6.4 oz (80.9 kg) 06/15/20 184 lb 12.8 oz (83.8 kg) 06/02/20 183 lb   Past Surgical History:  Procedure Laterality Date   CATARACT EXTRACTION, BILATERAL     GALLBLADDER SURGERY     HERNIA REPAIR     bilateral   MELANOMA EXCISION     Patient Active Problem List   Diagnosis Date Noted   Trigger finger, right ring finger 09/24/2022   Lumbar back pain 09/05/2022   Arthritis of both hands 09/05/2022   Gastritis 07/05/2022   Hiatal hernia 07/05/2022   Weight loss 06/07/2022   LVH (left ventricular hypertrophy) 06/05/2022   Grade I diastolic dysfunction 06/05/2022   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   Right inguinal hernia 05/21/2022   Aortic atherosclerosis (HCC) 05/21/2022   Bronchiectasis (HCC) 05/21/2022   Recurrent kidney stones 05/21/2022   Calcification of prostate 05/21/2022   DDD (degenerative disc disease), lumbar 05/21/2022  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   RUQ abdominal pain 05/08/2022   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, not believe to be due to lupus or systemic sclerosis 05/08/2022   Kidney cyst, acquired 04/29/2022   Fatty liver 04/29/2022   Chest discomfort 04/05/2022   Former smoker 04/05/2022   High risk medication use 04/05/2022   Chronic fatigue 10/26/2021   RLS (restless legs syndrome) 09/21/2021   Right knee pain 09/15/2020   Pain in joint of right shoulder 09/15/2020   B12 deficiency 05/25/2019   Insomnia 04/23/2018   History of melanoma 03/26/2018   GERD (gastroesophageal reflux disease) 02/24/2018   Mixed hyperlipidemia 02/24/2018   Osteoarthritis, multiple sites 02/24/2018   OSA on CPAP 12/30/2013   Essential tremor 05/16/2012   Idiopathic  peripheral neuropathy 05/16/2012     PCP: Glenetta Hew  REFERRING PROVIDER: Glenetta Hew  REFERRING DIAG: Stiffness in joint, restless leg   THERAPY DIAG:  Muscle weakness (generalized)  Pain in left leg  Pain in right leg  Rationale for Evaluation and Treatment: Rehabilitation  ONSET DATE:    SUBJECTIVE:   SUBJECTIVE STATEMENT: Eval: Pt reports being very active, works out at [employer] 2 x/wk, has been doing treadmill walking and upper body strengthening. He was doing lower body, but legs have felt increasingly sore and uncomfortable, so he has stopped. Reports Neuropathy in lower legs. He feels very stiff in hips and knees. Notes OA in knees. He has muscle soreness in gastrocs and hamstrings. Notes much difficulty with hamstring soreness when staying/working in squat type position. States he has to keep legs moving all the time to stay more comfortable.    PERTINENT HISTORY: Skin ca, restless leg, neuropathy.   PAIN:  Are you having pain? Yes: NPRS scale: up to 6/10 Pain location: bil knee pain Pain description: sore, stiff  Aggravating factors: initial standing Relieving factors: movement  Are you having pain? Yes: NPRS scale: up to 7/10 Pain location: bil lower legs hamstrings and calves Pain description: tight Aggravating factors: none stated  Relieving factors: none stated    PRECAUTIONS: None   WEIGHT BEARING RESTRICTIONS: No  FALLS:  Has patient fallen in last 6 months? No   PLOF: Independent  PATIENT GOALS:  Decreased pain   NEXT MD VISIT:   OBJECTIVE:   DIAGNOSTIC FINDINGS:   PATIENT SURVEYS:    COGNITION: Overall cognitive status: Within functional limits for tasks assessed     SENSATION: WFL  EDEMA:   POSTURE:    No Significant postural limitations  PALPATION:  Hamstrings:  13 in from floor  Tightness in hamstrings and gastrocs bilaterally   LOWER EXTREMITY ROM:  Mild limitation for hip flex and mod for ER - more limited on  R vs L.  Lumbar: mild/mod limitation for flex/ext and rotation Knees: WFL Ankles: WFL  LOWER EXTREMITY MMT:  MMT Left eval Right  eval  Hip flexion 4 4  Hip extension    Hip abduction 4 4  Hip adduction    Hip internal rotation    Hip external rotation    Knee flexion 4 4  Knee extension 4+ 4+  Ankle dorsiflexion    Ankle plantarflexion Able to do double leg heel raise  Able to do double leg heel raise  Ankle inversion    Ankle eversion     (Blank rows = not tested)  LOWER EXTREMITY SPECIAL TESTS:    GAIT: Unremarkable    TODAY'S TREATMENT:  DATE:   06/05/2023 Ther ex: see below for HEP  PATIENT EDUCATION:  Education details: PT POC, Exam findings, HEP Person educated: Patient Education method: Explanation, Demonstration, Tactile cues, Verbal cues, and Handouts Education comprehension: verbalized understanding, returned demonstration, verbal cues required, tactile cues required, and needs further education   HOME EXERCISE PROGRAM: Access Code: [email or web address] URL: [email or web address] Date: 06/05/2023 Prepared by: Sedalia Muta  Exercises - Supine Lower Trunk Rotation  - 2 x daily - 10 reps - 10 hold - Supine Piriformis Stretch Pulling Heel to Hip  - 2 x daily - 3 reps - 20-30 hold - Seated Hamstring Stretch  - 2 x daily - 3 reps - 30 hold  ASSESSMENT:  CLINICAL IMPRESSION: Patient presents with primary complaint of  tightness and stiffness in LEs. He has most tightness and limitation in bil gastrocs and hamstrings. He has neuropathy in lower legs that is effecting sensation and discomfort. He does however have good strength preservation in lower legs and feet. Pt with lack of effective HEP for back and LEs. He is doing well with exercising UEs at gym. Pt to benefit from mobility for back and LEs, as well as functional strength  for LEs, and pain management techniques for lower legs/neuropathy.  Pt with decreased ability for full functional activities. Pt will  benefit from skilled PT to improve deficits and pain and to return to PLOF.   OBJECTIVE IMPAIRMENTS: decreased activity tolerance, decreased balance, decreased ROM, decreased strength, increased muscle spasms, impaired flexibility, improper body mechanics, and pain.   ACTIVITY LIMITATIONS: bending, squatting, and locomotion level  PARTICIPATION LIMITATIONS: cleaning, shopping, community activity, and yard work  PERSONAL FACTORS: Time since onset of injury/illness/exacerbation and 1 comorbidity: neuropathy, OA  are also affecting patient's functional outcome.   REHAB POTENTIAL: Good  CLINICAL DECISION MAKING: Evolving/moderate complexity  EVALUATION COMPLEXITY: Moderate   GOALS: Goals reviewed with patient? Yes   SHORT TERM GOALS: Target date: 06/19/2023   Pt to be independent with initial HEP  Goal status: INITIAL  2.  Pt to verbalize mobility and/or massage/vibration techniques to decrease discomfort with neuropathy  Goal status: INITIAL     LONG TERM GOALS: Target date: 07/31/2023   Pt to be independent with final HEP  Goal status: INITIAL  2.  Pt to demo ability for squat position/hold for 30 sec without pain greater than 2/10 in LEs.   Goal status: INITIAL  3.  Pt to report at least 75 % improvement in overall symptoms.   Goal status: INITIAL  4.  Pt to report decreased discomfort in LEs to 0-4/10 to improve ability for IADLs and exercise.   Goal status: INITIAL    PLAN:  PT FREQUENCY: 1-2x/week  PT DURATION: 8 weeks  PLANNED INTERVENTIONS: Therapeutic exercises, Therapeutic activity, Neuromuscular re-education, Patient/Family education, Self Care, Joint mobilization, Joint manipulation, Stair training, Orthotic/Fit training, DME instructions, Aquatic Therapy, Dry Needling, Electrical stimulation, Cryotherapy, Moist  heat, Taping, Ultrasound, Ionotophoresis 4mg /ml Dexamethasone, Manual therapy,  Vasopneumatic device, Traction, Spinal manipulation, Spinal mobilization,Balance training, Gait training,   PLAN FOR NEXT SESSION:  add gastroc stretches, back, hip, and LE mobility, hamstring and gastroc tightness, squat/ LE functional strength ;  progress to balance.   Sedalia Muta, PT, DPT 9:56 AM  06/05/23

## 2023-06-08 NOTE — Progress Notes (Unsigned)
 Subjective:    Patient ID: Stephen Anderson, male    DOB: [birth year], 83 y.o.   MRN: [id number]  HPI male former smoker  followed for OSA complicated by peripheral neuropathy, essential tremor NPSG 11/2013  AHI 21/ hr, titrated to 8 cwp  -----------------------------------------------------------   06/04/22- 83 yo male former smoker followed for OSA, Insomnia,  complicated by peripheral neuropathy, essential tremor, GERD, BPH,  CPAP auto 5-15/Adapt -Caffeine 200 mg tab, primidone,Lyrica  Download- compliance 100%, AHI 8.6/ hr Body weight today-178 lbs Covid vax-3 Phizer Flu vax-no Download reviewed.  He is doing very well, using CPAP every night.  Machine currently pressure ranging between 6.4 and 10.5.  He is having a few breakthrough events per hour likely reflecting mask leak, but he is comfortable.  He is trying to make masks last longer because of cost. His primary complaints continue related to peripheral neuropathy and arthritis for which primary physician is making adjustments.  [facility] chest 05/05/22 1. No acute findings in the chest. Specifically, no findings to explain the patient's history of weight loss. 2. Mild cylindrical bronchiectasis in all lobes of both lungs. 3. 2 mm nonobstructing left renal stone. 4. 1.8 cm lesion interpolar right kidney is new since 2017, likely a cyst. CT abdomen with contrast material recommended to exclude caliceal lesion. 5. Emphysema (ICD10-J43.9) and Aortic Atherosclerosis (ICD10-170.0)  06/10/23-  83 yo male former smoker followed for OSA, Insomnia,  complicated by peripheral neuropathy, essential tremor, GERD, BPH,  CPAP auto 5-15/Adapt -Caffeine 200 mg tab, primidone,Lyrica  Download- compliance 97%, AHI 8.6/hr     pressure range 6.4-10.2     leak 44.4 up to 62.7 Body weight today-180 lbs Discussed the use of AI scribe software for clinical note transcription with the patient, who gave verbal consent to  proceed. History of Present Illness   The patient, with a history of sleep apnea managed with CPAP, reports no issues with his CPAP machine. He receives regular supplies and adjusts the mask as needed for comfort and to prevent leaks. He notes that the machine uses more water in the winter, causing the bottom of the tank to become hot. I suggested adjusting the humidifier settings to prevent the machine from running dry.  The patient also reports regular exercise, including treadmill workouts at the gym twice a week, during which he achieves a heart rate of around 140 bpm. He is considering incorporating bicycle workouts into his routine.  In addition, the patient mentions a history of neuropathy, expressing hope for a future cure and concern about potential complications.     ROS-see HPI    "+" = positive Constitutional:    weight loss, night sweats, fevers, chills, fatigue, lassitude. HEENT:    headaches, difficulty swallowing, tooth/dental problems, sore throat,       sneezing, itching, ear ache, nasal congestion, post nasal drip, snoring CV:    chest pain, orthopnea, PND, swelling in lower extremities, anasarca,                                                 dizziness, palpitations Resp:   shortness of breath with exertion or at rest.                productive cough,   non-productive cough, coughing up of blood.  change in color of mucus.  wheezing.   Skin:    rash or lesions. GI:  No-   heartburn, indigestion, abdominal pain, nausea, vomiting, diarrhea,                 change in bowel habits, loss of appetite GU: dysuria, change in color of urine, no urgency or frequency.   flank pain. MS:   +joint pain, stiffness, decreased range of motion, back pain. Neuro-    + symptoms of peripheral neuropathy both lower legs. Psych:  change in mood or affect.  depression or anxiety.   memory loss.    Objective:  OBJ- Physical Exam   General- Alert, Oriented, Affect-appropriate,  Distress- none acute, fit-appearing/ not obese Skin- rash-none, lesions- none, excoriation- none Lymphadenopathy- none Head- atraumatic            Eyes- Gross vision intact, PERRLA, conjunctivae and secretions clear            Ears- +Hard of hearing            Nose- Clear, no-Septal dev, mucus, polyps, erosion, perforation             Throat- Mallampati II , mucosa clear , drainage- none, tonsils- atrophic Neck- flexible , trachea midline, no stridor , thyroid nl, carotid no bruit Chest - symmetrical excursion , unlabored           Heart/CV- RRR , no murmur , no gallop  , no rub, nl s1 s2                           - JVD- none , edema- none, stasis changes- none, varices- none           Lung- clear to P&A, wheeze- none, cough- none , dullness-none, rub- none           Chest wall-  Abd-  Br/ Gen/ Rectal- Not done, not indicated Extrem- cyanosis- none, clubbing, none, atrophy- none, strength- nl Neuro- + tremor mouth  Assessment & Plan:  Assessment and Plan    Obstructive Sleep Apnea Patient is compliant with CPAP use and reports occasional mask leak. Machine settings are appropriate with a range of 5-15, spending most of the time between 6-10. Mask leak accounts for higher than optimal AHI. -Continue current CPAP settings. -Adjust mask as needed for comfort and to prevent leaks. -Consider adjusting humidifier settings if excessive water use continues.  General Health Maintenance Patient is engaging in regular exercise, primarily treadmill use, and considering incorporating bicycle use. -Continue regular exercise regimen. -Consider alternating between treadmill and bicycle for variety.  Follow-up in 1 year unless issues arise with CPAP use or other health concerns.

## 2023-06-10 ENCOUNTER — Ambulatory Visit: Payer: HMO | Admitting: Internal Medicine

## 2023-06-10 ENCOUNTER — Encounter: Payer: Self-pay | Admitting: Internal Medicine

## 2023-06-10 VITALS — BP 125/60 | HR 61 | Temp 97.9°F | Resp 18 | Ht 69.5 in | Wt 180.8 lb

## 2023-06-10 DIAGNOSIS — G4733 Obstructive sleep apnea (adult) (pediatric): Secondary | ICD-10-CM | POA: Diagnosis not present

## 2023-06-10 NOTE — Patient Instructions (Signed)
 We can continue CPAP auto 5-15  Please call if we can help

## 2023-06-12 ENCOUNTER — Ambulatory Visit: Admitting: Physical Therapy

## 2023-06-12 ENCOUNTER — Encounter: Payer: Self-pay | Admitting: Physical Therapy

## 2023-06-12 DIAGNOSIS — M79605 Pain in left leg: Secondary | ICD-10-CM

## 2023-06-12 DIAGNOSIS — M6281 Muscle weakness (generalized): Secondary | ICD-10-CM | POA: Diagnosis not present

## 2023-06-12 DIAGNOSIS — L578 Other skin changes due to chronic exposure to nonionizing radiation: Secondary | ICD-10-CM | POA: Diagnosis not present

## 2023-06-12 DIAGNOSIS — M79604 Pain in right leg: Secondary | ICD-10-CM | POA: Diagnosis not present

## 2023-06-12 NOTE — Therapy (Signed)
 OUTPATIENT PHYSICAL THERAPY LOWER EXTREMITY TREATMENT   Patient Name: Stephen Anderson MRN: [id number] DOB:01-Aug-1940, 83 y.o., male Today's Date: 06/12/2023  END OF SESSION:  PT End of Session - 06/12/23 0801     Visit Number 2    Number of Visits 16    Date for PT Re-Evaluation 07/31/23    Authorization Type HTA    PT Start Time 0803    PT Stop Time 0845    PT Time Calculation (min) 42 min    Activity Tolerance Patient tolerated treatment well    Behavior During Therapy [facility] for tasks assessed/performed             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)   Chest discomfort 04/05/2022   Associated with fatigue worsening 02/2022   Essential tremor    GERD (gastroesophageal reflux disease) 02/24/2018   High risk medication use 04/05/2022   Lyrica for neuropathy     History of melanoma 03/26/2018   S/p excision on back   Hypercholesteremia    Iron deficiency anemia 06/07/2022   Lab Results  Component  Value  Date/Time     HGB  14.8  05/08/2022 09:12 AM     HGB  13.9  03/06/2022 08:44 AM     HGB  11.9 (L)  10/26/2021 08:37 AM     HGB  13.1  06/27/2021 08:58 AM     HGB  14.8  05/24/2020 08:17 AM         Mixed hyperlipidemia 02/24/2018   Peripheral neuropathy    RUQ abdominal pain 05/08/2022   We did ultrasound abdomen 04/26/22 to evaluate pain he has where it feels like right upper quadrant of abdomen pain flared by leaning forward feels like liver tip rolling over but that was negative   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   well diff-right hand- txpbx   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 (CX35FU)   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   in situ-right parotid area   Unintentional weight loss 04/05/2022   Wt Readings from Last 20 Encounters: 04/05/22 170 lb (77.1 kg) 03/06/22 178 lb  12.8 oz (81.1 kg) 10/26/21 168 lb (76.2 kg) 09/21/21 176 lb 2 oz (79.9 kg) 07/05/21 182 lb 6.4 oz (82.7 kg) 06/27/21 179 lb (81.2 kg) 06/01/21 178 lb 12.8 oz (81.1 kg) 01/10/21 174 lb 6.4 oz (79.1 kg) 12/19/20 171 lb (77.6 kg) 08/29/20 178 lb 6.4 oz (80.9 kg) 06/15/20 184 lb 12.8 oz (83.8 kg) 06/02/20 183 lb   Past Surgical History:  Procedure Laterality Date   CATARACT EXTRACTION, BILATERAL     GALLBLADDER SURGERY     HERNIA REPAIR     bilateral   MELANOMA EXCISION     Patient Active Problem List   Diagnosis Date Noted   Trigger finger, right ring finger 09/24/2022   Lumbar back pain 09/05/2022   Arthritis of both hands 09/05/2022   Gastritis 07/05/2022   Hiatal hernia 07/05/2022   Weight loss 06/07/2022   LVH (left ventricular hypertrophy) 06/05/2022   Grade I diastolic dysfunction 06/05/2022   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   Right inguinal hernia 05/21/2022   Aortic atherosclerosis (HCC) 05/21/2022   Bronchiectasis (HCC) 05/21/2022   Recurrent kidney stones 05/21/2022   Calcification of prostate 05/21/2022   DDD (degenerative disc disease), lumbar 05/21/2022  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   RUQ abdominal pain 05/08/2022   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, not believe to be due to lupus or systemic sclerosis 05/08/2022   Kidney cyst, acquired 04/29/2022   Fatty liver 04/29/2022   Chest discomfort 04/05/2022   Former smoker 04/05/2022   High risk medication use 04/05/2022   Chronic fatigue 10/26/2021   RLS (restless legs syndrome) 09/21/2021   Right knee pain 09/15/2020   Pain in joint of right shoulder 09/15/2020   B12 deficiency 05/25/2019   Insomnia 04/23/2018   History of melanoma 03/26/2018   GERD (gastroesophageal reflux disease) 02/24/2018   Mixed hyperlipidemia 02/24/2018   Osteoarthritis, multiple sites 02/24/2018   OSA on CPAP 12/30/2013   Essential tremor 05/16/2012   Idiopathic peripheral  neuropathy 05/16/2012     PCP: Glenetta Hew  REFERRING PROVIDER: Glenetta Hew  REFERRING DIAG: Stiffness in joint, restless leg   THERAPY DIAG:  Pain in left leg  Pain in right leg  Muscle weakness (generalized)  Rationale for Evaluation and Treatment: Rehabilitation  ONSET DATE:    SUBJECTIVE:   SUBJECTIVE STATEMENT: Pt no new complaints. Has been doing HEP,  states some soreness in hips after performing.   Eval: Pt reports being very active, works out at [employer] 2 x/wk, has been doing treadmill walking and upper body strengthening. He was doing lower body, but legs have felt increasingly sore and uncomfortable, so he has stopped. Reports Neuropathy in lower legs. He feels very stiff in hips and knees. Notes OA in knees. He has muscle soreness in gastrocs and hamstrings. Notes much difficulty with hamstring soreness when staying/working in squat type position. States he has to keep legs moving all the time to stay more comfortable.    PERTINENT HISTORY: Skin ca, restless leg, neuropathy.   PAIN:  Are you having pain? Yes: NPRS scale: up to 6/10 Pain location: bil knee pain Pain description: sore, stiff  Aggravating factors: initial standing Relieving factors: movement  Are you having pain? Yes: NPRS scale: up to 7/10 Pain location: bil lower legs hamstrings and calves Pain description: tight Aggravating factors: none stated  Relieving factors: none stated    PRECAUTIONS: None   WEIGHT BEARING RESTRICTIONS: No  FALLS:  Has patient fallen in last 6 months? No   PLOF: Independent  PATIENT GOALS:  Decreased pain   NEXT MD VISIT:   OBJECTIVE:   DIAGNOSTIC FINDINGS:   PATIENT SURVEYS:    COGNITION: Overall cognitive status: Within functional limits for tasks assessed     SENSATION: WFL  EDEMA:   POSTURE:    No Significant postural limitations  PALPATION:  Hamstrings:  13 in from floor  Tightness in hamstrings and gastrocs bilaterally    LOWER EXTREMITY ROM:  Mild limitation for hip flex and mod for ER - more limited on R vs L.  Lumbar: mild/mod limitation for flex/ext and rotation Knees: WFL Ankles: WFL  LOWER EXTREMITY MMT:  MMT Left eval Right  eval  Hip flexion 4 4  Hip extension    Hip abduction 4 4  Hip adduction    Hip internal rotation    Hip external rotation    Knee flexion 4 4  Knee extension 4+ 4+  Ankle dorsiflexion    Ankle plantarflexion Able to do double leg heel raise  Able to do double leg heel raise  Ankle inversion    Ankle eversion     (Blank rows = not tested)  LOWER EXTREMITY SPECIAL  TESTS:    GAIT: Unremarkable    TODAY'S TREATMENT:                                                                                                                              DATE:   06/12/2023 Therapeutic Exercise: Aerobic:  bike L2  x 5 min  Supine: Seated:    Standing: Stretches:   Seated HSS 30 sec x 3 bil;   LTR x 15;  SKTC 30 sec x 3;  piriformis 30 sec x 3 bil;  Gastroc stretch on step  and at wall 30 sec x 3 ea bil;   Neuromuscular Re-education: Manual Therapy: Therapeutic Activity:  sit to stand x 12,  7lb;   squats at mat table 2 x 5;   Self Care:   PATIENT EDUCATION:  Education details: updated and reviewed HEP Person educated: Patient Education method: Explanation, Demonstration, Tactile cues, Verbal cues, and Handouts Education comprehension: verbalized understanding, returned demonstration, verbal cues required, tactile cues required, and needs further education  HOME EXERCISE PROGRAM: Access Code: [email or web address]   ASSESSMENT:  CLINICAL IMPRESSION: Pt with good tolerance for activity. He requires max cuing for going gentle with stretching. Will benefit from continued mobility and LE strength. Good tolerance for bike today, recommended he perform at gym for hip mobility.   Eval: Patient presents with primary complaint of  tightness and stiffness in LEs. He has most  tightness and limitation in bil gastrocs and hamstrings. He has neuropathy in lower legs that is effecting sensation and discomfort. He does however have good strength preservation in lower legs and feet. Pt with lack of effective HEP for back and LEs. He is doing well with exercising UEs at gym. Pt to benefit from mobility for back and LEs, as well as functional strength for LEs, and pain management techniques for lower legs/neuropathy.  Pt with decreased ability for full functional activities. Pt will  benefit from skilled PT to improve deficits and pain and to return to PLOF.   OBJECTIVE IMPAIRMENTS: decreased activity tolerance, decreased balance, decreased ROM, decreased strength, increased muscle spasms, impaired flexibility, improper body mechanics, and pain.   ACTIVITY LIMITATIONS: bending, squatting, and locomotion level  PARTICIPATION LIMITATIONS: cleaning, shopping, community activity, and yard work  PERSONAL FACTORS: Time since onset of injury/illness/exacerbation and 1 comorbidity: neuropathy, OA  are also affecting patient's functional outcome.   REHAB POTENTIAL: Good  CLINICAL DECISION MAKING: Evolving/moderate complexity  EVALUATION COMPLEXITY: Moderate   GOALS: Goals reviewed with patient? Yes   SHORT TERM GOALS: Target date: 06/19/2023   Pt to be independent with initial HEP  Goal status: INITIAL  2.  Pt to verbalize mobility and/or massage/vibration techniques to decrease discomfort with neuropathy  Goal status: INITIAL    LONG TERM GOALS: Target date: 07/31/2023   Pt to be independent with final HEP  Goal status: INITIAL  2.  Pt to demo ability for squat position/hold for 30 sec without pain greater  than 2/10 in LEs.   Goal status: INITIAL  3.  Pt to report at least 75 % improvement in overall symptoms.   Goal status: INITIAL  4.  Pt to report decreased discomfort in LEs to 0-4/10 to improve ability for IADLs and exercise.   Goal status:  INITIAL    PLAN:  PT FREQUENCY: 1-2x/week  PT DURATION: 8 weeks  PLANNED INTERVENTIONS: Therapeutic exercises, Therapeutic activity, Neuromuscular re-education, Patient/Family education, Self Care, Joint mobilization, Joint manipulation, Stair training, Orthotic/Fit training, DME instructions, Aquatic Therapy, Dry Needling, Electrical stimulation, Cryotherapy, Moist heat, Taping, Ultrasound, Ionotophoresis 4mg /ml Dexamethasone, Manual therapy,  Vasopneumatic device, Traction, Spinal manipulation, Spinal mobilization,Balance training, Gait training,   PLAN FOR NEXT SESSION:  add gastroc stretches, back, hip, and LE mobility, hamstring and gastroc tightness, squat/ LE functional strength ;  progress to balance.    Sedalia Muta, PT, DPT 8:19 AM  06/12/23

## 2023-06-18 ENCOUNTER — Ambulatory Visit: Payer: HMO | Admitting: Physical Therapy

## 2023-06-18 ENCOUNTER — Encounter: Payer: Self-pay | Admitting: Physical Therapy

## 2023-06-18 DIAGNOSIS — M79604 Pain in right leg: Secondary | ICD-10-CM | POA: Diagnosis not present

## 2023-06-18 DIAGNOSIS — M79605 Pain in left leg: Secondary | ICD-10-CM

## 2023-06-18 DIAGNOSIS — M6281 Muscle weakness (generalized): Secondary | ICD-10-CM | POA: Diagnosis not present

## 2023-06-18 NOTE — Therapy (Signed)
 OUTPATIENT PHYSICAL THERAPY LOWER EXTREMITY TREATMENT   Patient Name: Stephen Anderson MRN: [id number] DOB:June 17, 1940, 83 y.o., male Today's Date: 06/18/2023  END OF SESSION:  PT End of Session - 06/18/23 0807     Visit Number 3    Number of Visits 16    Date for PT Re-Evaluation 07/31/23    Authorization Type HTA    PT Start Time 0804    PT Stop Time 0842    PT Time Calculation (min) 38 min    Activity Tolerance Patient tolerated treatment well    Behavior During Therapy [facility] for tasks assessed/performed             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)   Chest discomfort 04/05/2022   Associated with fatigue worsening 02/2022   Essential tremor    GERD (gastroesophageal reflux disease) 02/24/2018   High risk medication use 04/05/2022   Lyrica for neuropathy     History of melanoma 03/26/2018   S/p excision on back   Hypercholesteremia    Iron deficiency anemia 06/07/2022   Lab Results  Component  Value  Date/Time     HGB  14.8  05/08/2022 09:12 AM     HGB  13.9  03/06/2022 08:44 AM     HGB  11.9 (L)  10/26/2021 08:37 AM     HGB  13.1  06/27/2021 08:58 AM     HGB  14.8  05/24/2020 08:17 AM         Mixed hyperlipidemia 02/24/2018   Peripheral neuropathy    RUQ abdominal pain 05/08/2022   We did ultrasound abdomen 04/26/22 to evaluate pain he has where it feels like right upper quadrant of abdomen pain flared by leaning forward feels like liver tip rolling over but that was negative   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   well diff-right hand- txpbx   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 (CX35FU)   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   in situ-right parotid area   Unintentional weight loss 04/05/2022   Wt Readings from Last 20 Encounters: 04/05/22 170 lb (77.1 kg) 03/06/22 178 lb  12.8 oz (81.1 kg) 10/26/21 168 lb (76.2 kg) 09/21/21 176 lb 2 oz (79.9 kg) 07/05/21 182 lb 6.4 oz (82.7 kg) 06/27/21 179 lb (81.2 kg) 06/01/21 178 lb 12.8 oz (81.1 kg) 01/10/21 174 lb 6.4 oz (79.1 kg) 12/19/20 171 lb (77.6 kg) 08/29/20 178 lb 6.4 oz (80.9 kg) 06/15/20 184 lb 12.8 oz (83.8 kg) 06/02/20 183 lb   Past Surgical History:  Procedure Laterality Date   CATARACT EXTRACTION, BILATERAL     GALLBLADDER SURGERY     HERNIA REPAIR     bilateral   MELANOMA EXCISION     Patient Active Problem List   Diagnosis Date Noted   Trigger finger, right ring finger 09/24/2022   Lumbar back pain 09/05/2022   Arthritis of both hands 09/05/2022   Gastritis 07/05/2022   Hiatal hernia 07/05/2022   Weight loss 06/07/2022   LVH (left ventricular hypertrophy) 06/05/2022   Grade I diastolic dysfunction 06/05/2022   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   Right inguinal hernia 05/21/2022   Aortic atherosclerosis (HCC) 05/21/2022   Bronchiectasis (HCC) 05/21/2022   Recurrent kidney stones 05/21/2022   Calcification of prostate 05/21/2022   DDD (degenerative disc disease), lumbar 05/21/2022  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   RUQ abdominal pain 05/08/2022   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, not believe to be due to lupus or systemic sclerosis 05/08/2022   Kidney cyst, acquired 04/29/2022   Fatty liver 04/29/2022   Chest discomfort 04/05/2022   Former smoker 04/05/2022   High risk medication use 04/05/2022   Chronic fatigue 10/26/2021   RLS (restless legs syndrome) 09/21/2021   Right knee pain 09/15/2020   Pain in joint of right shoulder 09/15/2020   B12 deficiency 05/25/2019   Insomnia 04/23/2018   History of melanoma 03/26/2018   GERD (gastroesophageal reflux disease) 02/24/2018   Mixed hyperlipidemia 02/24/2018   Osteoarthritis, multiple sites 02/24/2018   OSA on CPAP 12/30/2013   Essential tremor 05/16/2012   Idiopathic peripheral  neuropathy 05/16/2012     PCP: Glenetta Hew  REFERRING PROVIDER: Glenetta Hew  REFERRING DIAG: Stiffness in joint, restless leg   THERAPY DIAG:  Pain in left leg  Pain in right leg  Muscle weakness (generalized)  Rationale for Evaluation and Treatment: Rehabilitation  ONSET DATE:    SUBJECTIVE:   SUBJECTIVE STATEMENT: Pt no new complaints. Has been doing HEP, feeling good on his legs.   Eval: Pt reports being very active, works out at [employer] 2 x/wk, has been doing treadmill walking and upper body strengthening. He was doing lower body, but legs have felt increasingly sore and uncomfortable, so he has stopped. Reports Neuropathy in lower legs. He feels very stiff in hips and knees. Notes OA in knees. He has muscle soreness in gastrocs and hamstrings. Notes much difficulty with hamstring soreness when staying/working in squat type position. States he has to keep legs moving all the time to stay more comfortable.    PERTINENT HISTORY: Skin ca, restless leg, neuropathy.   PAIN:  Are you having pain? Yes: NPRS scale: up to 6/10 Pain location: bil knee pain Pain description: sore, stiff  Aggravating factors: initial standing Relieving factors: movement  Are you having pain? Yes: NPRS scale: up to 7/10 Pain location: bil lower legs hamstrings and calves Pain description: tight Aggravating factors: none stated  Relieving factors: none stated    PRECAUTIONS: None   WEIGHT BEARING RESTRICTIONS: No  FALLS:  Has patient fallen in last 6 months? No   PLOF: Independent  PATIENT GOALS:  Decreased pain   NEXT MD VISIT:   OBJECTIVE:   DIAGNOSTIC FINDINGS:   PATIENT SURVEYS:    COGNITION: Overall cognitive status: Within functional limits for tasks assessed     SENSATION: WFL  EDEMA:   POSTURE:    No Significant postural limitations  PALPATION:  Hamstrings:  13 in from floor  Tightness in hamstrings and gastrocs bilaterally   LOWER EXTREMITY  ROM:  Mild limitation for hip flex and mod for ER - more limited on R vs L.  Lumbar: mild/mod limitation for flex/ext and rotation Knees: WFL Ankles: WFL  LOWER EXTREMITY MMT:  MMT Left eval Right  eval  Hip flexion 4 4  Hip extension    Hip abduction 4 4  Hip adduction    Hip internal rotation    Hip external rotation    Knee flexion 4 4  Knee extension 4+ 4+  Ankle dorsiflexion    Ankle plantarflexion Able to do double leg heel raise  Able to do double leg heel raise  Ankle inversion    Ankle eversion     (Blank rows = not tested)  LOWER EXTREMITY SPECIAL TESTS:  GAIT: Unremarkable    TODAY'S TREATMENT:                                                                                                                              DATE:   06/18/2023 Therapeutic Exercise: Aerobic:  bike L2  x 5 min  Supine: Seated:    Standing:  heel raises 2 x 10 at counter;  Stretches:   Seated HSS 30 sec x 3 bil;   Seated piriformis 30 sec x 3 bil;  Gastroc stretch at wall 30 sec x 3 ea bil;   Neuromuscular Re-education: Manual Therapy: Therapeutic Activity:  sit to stand x 12 (10lb)   squats at chair x 12 (10lb) ;  Dead lift motion x 12 (10lb) cuing for mechanics.  Self Care:   Therapeutic Exercise: Aerobic:  bike L2  x 5 min  Supine: Seated:    Standing: Stretches:   Seated HSS 30 sec x 3 bil;   LTR x 15;  SKTC 30 sec x 3;  piriformis 30 sec x 3 bil;  Gastroc stretch on step  and at wall 30 sec x 3 ea bil;   Neuromuscular Re-education: Manual Therapy: Therapeutic Activity:  sit to stand x 12,  7lb;   squats at mat table 2 x 5;   Self Care:   PATIENT EDUCATION:  Education details: updated and reviewed HEP Person educated: Patient Education method: Explanation, Demonstration, Tactile cues, Verbal cues, and Handouts Education comprehension: verbalized understanding, returned demonstration, verbal cues required, tactile cues required, and needs further  education  HOME EXERCISE PROGRAM: Access Code: [email or web address]   ASSESSMENT:  CLINICAL IMPRESSION: Pt with good tolerance for activity. Continued mobility for LEs and strengthening for quads and hamstrings today. Pt with good motion and squat ability. Reviewed knee extension and HSC to do at gym for strength. Plan to progress as able.   Eval: Patient presents with primary complaint of  tightness and stiffness in LEs. He has most tightness and limitation in bil gastrocs and hamstrings. He has neuropathy in lower legs that is effecting sensation and discomfort. He does however have good strength preservation in lower legs and feet. Pt with lack of effective HEP for back and LEs. He is doing well with exercising UEs at gym. Pt to benefit from mobility for back and LEs, as well as functional strength for LEs, and pain management techniques for lower legs/neuropathy.  Pt with decreased ability for full functional activities. Pt will  benefit from skilled PT to improve deficits and pain and to return to PLOF.   OBJECTIVE IMPAIRMENTS: decreased activity tolerance, decreased balance, decreased ROM, decreased strength, increased muscle spasms, impaired flexibility, improper body mechanics, and pain.   ACTIVITY LIMITATIONS: bending, squatting, and locomotion level  PARTICIPATION LIMITATIONS: cleaning, shopping, community activity, and yard work  PERSONAL FACTORS: Time since onset of injury/illness/exacerbation and 1 comorbidity: neuropathy, OA  are also affecting patient's functional outcome.   REHAB POTENTIAL: Good  CLINICAL DECISION MAKING: Evolving/moderate complexity  EVALUATION COMPLEXITY: Moderate   GOALS: Goals reviewed with patient? Yes   SHORT TERM GOALS: Target date: 06/19/2023   Pt to be independent with initial HEP  Goal status: INITIAL  2.  Pt to verbalize mobility and/or massage/vibration techniques to decrease discomfort with neuropathy  Goal status: INITIAL    LONG TERM  GOALS: Target date: 07/31/2023   Pt to be independent with final HEP  Goal status: INITIAL  2.  Pt to demo ability for squat position/hold for 30 sec without pain greater than 2/10 in LEs.   Goal status: INITIAL  3.  Pt to report at least 75 % improvement in overall symptoms.   Goal status: INITIAL  4.  Pt to report decreased discomfort in LEs to 0-4/10 to improve ability for IADLs and exercise.   Goal status: INITIAL    PLAN:  PT FREQUENCY: 1-2x/week  PT DURATION: 8 weeks  PLANNED INTERVENTIONS: Therapeutic exercises, Therapeutic activity, Neuromuscular re-education, Patient/Family education, Self Care, Joint mobilization, Joint manipulation, Stair training, Orthotic/Fit training, DME instructions, Aquatic Therapy, Dry Needling, Electrical stimulation, Cryotherapy, Moist heat, Taping, Ultrasound, Ionotophoresis 4mg /ml Dexamethasone, Manual therapy,  Vasopneumatic device, Traction, Spinal manipulation, Spinal mobilization,Balance training, Gait training,   PLAN FOR NEXT SESSION:  add gastroc stretches, back, hip, and LE mobility, hamstring and gastroc tightness, squat/ LE functional strength ;  progress to balance.    Sedalia Muta, PT, DPT 8:41 AM  06/18/23

## 2023-06-20 ENCOUNTER — Encounter: Payer: Self-pay | Admitting: Physical Therapy

## 2023-06-20 ENCOUNTER — Ambulatory Visit: Payer: HMO | Admitting: Physical Therapy

## 2023-06-20 DIAGNOSIS — M79605 Pain in left leg: Secondary | ICD-10-CM | POA: Diagnosis not present

## 2023-06-20 DIAGNOSIS — M79604 Pain in right leg: Secondary | ICD-10-CM

## 2023-06-20 DIAGNOSIS — M6281 Muscle weakness (generalized): Secondary | ICD-10-CM | POA: Diagnosis not present

## 2023-06-20 NOTE — Therapy (Signed)
 OUTPATIENT PHYSICAL THERAPY LOWER EXTREMITY TREATMENT   Patient Name: CEVIN RUBINSTEIN MRN: [id number] DOB:[birth year], 83 y.o., male Today's Date: 06/20/2023  END OF SESSION:  PT End of Session - 06/20/23 0814     Visit Number 4    Number of Visits 16    Date for PT Re-Evaluation 07/31/23    Authorization Type HTA    PT Start Time 0805    PT Stop Time 0845    PT Time Calculation (min) 40 min    Activity Tolerance Patient tolerated treatment well    Behavior During Therapy [facility] for tasks assessed/performed             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)   Chest discomfort 04/05/2022   Associated with fatigue worsening 02/2022   Essential tremor    GERD (gastroesophageal reflux disease) 02/24/2018   High risk medication use 04/05/2022   Lyrica for neuropathy     History of melanoma 03/26/2018   S/p excision on back   Hypercholesteremia    Iron deficiency anemia 06/07/2022   Lab Results  Component  Value  Date/Time     HGB  14.8  05/08/2022 09:12 AM     HGB  13.9  03/06/2022 08:44 AM     HGB  11.9 (L)  10/26/2021 08:37 AM     HGB  13.1  06/27/2021 08:58 AM     HGB  14.8  05/24/2020 08:17 AM         Mixed hyperlipidemia 02/24/2018   Peripheral neuropathy    RUQ abdominal pain 05/08/2022   We did ultrasound abdomen 04/26/22 to evaluate pain he has where it feels like right upper quadrant of abdomen pain flared by leaning forward feels like liver tip rolling over but that was negative   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   well diff-right hand- txpbx   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 (CX35FU)   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   in situ-right parotid area   Unintentional weight loss 04/05/2022   Wt Readings from Last 20 Encounters: 04/05/22 170 lb (77.1 kg) 03/06/22 178 lb  12.8 oz (81.1 kg) 10/26/21 168 lb (76.2 kg) 09/21/21 176 lb 2 oz (79.9 kg) 07/05/21 182 lb 6.4 oz (82.7 kg) 06/27/21 179 lb (81.2 kg) 06/01/21 178 lb 12.8 oz (81.1 kg) 01/10/21 174 lb 6.4 oz (79.1 kg) 12/19/20 171 lb (77.6 kg) 08/29/20 178 lb 6.4 oz (80.9 kg) 06/15/20 184 lb 12.8 oz (83.8 kg) 06/02/20 183 lb   Past Surgical History:  Procedure Laterality Date   CATARACT EXTRACTION, BILATERAL     GALLBLADDER SURGERY     HERNIA REPAIR     bilateral   MELANOMA EXCISION     Patient Active Problem List   Diagnosis Date Noted   Trigger finger, right ring finger 09/24/2022   Lumbar back pain 09/05/2022   Arthritis of both hands 09/05/2022   Gastritis 07/05/2022   Hiatal hernia 07/05/2022   Weight loss 06/07/2022   LVH (left ventricular hypertrophy) 06/05/2022   Grade I diastolic dysfunction 06/05/2022   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   Right inguinal hernia 05/21/2022   Aortic atherosclerosis (HCC) 05/21/2022   Bronchiectasis (HCC) 05/21/2022   Recurrent kidney stones 05/21/2022   Calcification of prostate 05/21/2022   DDD (degenerative disc disease), lumbar 05/21/2022  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   RUQ abdominal pain 05/08/2022   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, not believe to be due to lupus or systemic sclerosis 05/08/2022   Kidney cyst, acquired 04/29/2022   Fatty liver 04/29/2022   Chest discomfort 04/05/2022   Former smoker 04/05/2022   High risk medication use 04/05/2022   Chronic fatigue 10/26/2021   RLS (restless legs syndrome) 09/21/2021   Right knee pain 09/15/2020   Pain in joint of right shoulder 09/15/2020   B12 deficiency 05/25/2019   Insomnia 04/23/2018   History of melanoma 03/26/2018   GERD (gastroesophageal reflux disease) 02/24/2018   Mixed hyperlipidemia 02/24/2018   Osteoarthritis, multiple sites 02/24/2018   OSA on CPAP 12/30/2013   Essential tremor 05/16/2012   Idiopathic peripheral  neuropathy 05/16/2012     PCP: Glenetta Hew  REFERRING PROVIDER: Glenetta Hew  REFERRING DIAG: Stiffness in joint, restless leg   THERAPY DIAG:  Pain in left leg  Pain in right leg  Muscle weakness (generalized)  Rationale for Evaluation and Treatment: Rehabilitation  ONSET DATE:    SUBJECTIVE:   SUBJECTIVE STATEMENT: Pt no new complaints. Has been doing HEP.   Eval: Pt reports being very active, works out at [employer] 2 x/wk, has been doing treadmill walking and upper body strengthening. He was doing lower body, but legs have felt increasingly sore and uncomfortable, so he has stopped. Reports Neuropathy in lower legs. He feels very stiff in hips and knees. Notes OA in knees. He has muscle soreness in gastrocs and hamstrings. Notes much difficulty with hamstring soreness when staying/working in squat type position. States he has to keep legs moving all the time to stay more comfortable.    PERTINENT HISTORY: Skin ca, restless leg, neuropathy.   PAIN:  Are you having pain? Yes: NPRS scale: up to 6/10 Pain location: bil knee pain Pain description: sore, stiff  Aggravating factors: initial standing Relieving factors: movement  Are you having pain? Yes: NPRS scale: up to 7/10 Pain location: bil lower legs hamstrings and calves Pain description: tight Aggravating factors: none stated  Relieving factors: none stated    PRECAUTIONS: None   WEIGHT BEARING RESTRICTIONS: No  FALLS:  Has patient fallen in last 6 months? No   PLOF: Independent  PATIENT GOALS:  Decreased pain   NEXT MD VISIT:   OBJECTIVE:   DIAGNOSTIC FINDINGS:   PATIENT SURVEYS:    COGNITION: Overall cognitive status: Within functional limits for tasks assessed     SENSATION: WFL  EDEMA:   POSTURE:    No Significant postural limitations  PALPATION:  Hamstrings:  13 in from floor  Tightness in hamstrings and gastrocs bilaterally   LOWER EXTREMITY ROM:  Mild limitation for hip  flex and mod for ER - more limited on R vs L.  Lumbar: mild/mod limitation for flex/ext and rotation Knees: WFL Ankles: WFL  LOWER EXTREMITY MMT:  MMT Left eval Right  eval  Hip flexion 4 4  Hip extension    Hip abduction 4 4  Hip adduction    Hip internal rotation    Hip external rotation    Knee flexion 4 4  Knee extension 4+ 4+  Ankle dorsiflexion    Ankle plantarflexion Able to do double leg heel raise  Able to do double leg heel raise  Ankle inversion    Ankle eversion     (Blank rows = not tested)  LOWER EXTREMITY SPECIAL TESTS:    GAIT: Unremarkable  TODAY'S TREATMENT:                                                                                                                              DATE:   06/20/2023 Therapeutic Exercise: Aerobic:  bike L2  x 8 min  Supine: Seated:   HSC GTB x 15 bil  Standing:  Stretches:   Seated HSS 30 sec x 3 bil;  fwd bend hamstring stretch x 3;  Seated piriformis 30 sec x 3 bil;  Neuromuscular Re-education: Manual Therapy: Therapeutic Activity:   squats at chair 2 x 10 (10lb) ;  Side stepping/squat position 15 ft x 4;  Self Care:    Therapeutic Exercise: Aerobic:  bike L2  x 5 min  Supine: Seated:    Standing:  heel raises 2 x 10 at counter;  Stretches:   Seated HSS 30 sec x 3 bil;   Seated piriformis 30 sec x 3 bil;  Gastroc stretch at wall 30 sec x 3 ea bil;   Neuromuscular Re-education: Manual Therapy: Therapeutic Activity:  sit to stand x 12 (10lb)   squats at chair x 12 (10lb) ;  Dead lift motion x 12 (10lb) cuing for mechanics.  Self Care:   Therapeutic Exercise: Aerobic:  bike L2  x 5 min  Supine: Seated:    Standing: Stretches:   Seated HSS 30 sec x 3 bil;   LTR x 15;  SKTC 30 sec x 3;  piriformis 30 sec x 3 bil;  Gastroc stretch on step  and at wall 30 sec x 3 ea bil;   Neuromuscular Re-education: Manual Therapy: Therapeutic Activity:  sit to stand x 12,  7lb;   squats at mat table 2 x 5;   Self  Care:   PATIENT EDUCATION:  Education details: updated and reviewed HEP Person educated: Patient Education method: Explanation, Demonstration, Tactile cues, Verbal cues, and Handouts Education comprehension: verbalized understanding, returned demonstration, verbal cues required, tactile cues required, and needs further education  HOME EXERCISE PROGRAM: Access Code: [email or web address]   ASSESSMENT:  CLINICAL IMPRESSION: Pt with good tolerance for activity. Continued mobility for LEs and strengthening for LEs. Plan to work more on hip hinge and back position with bending next visit.   Eval: Patient presents with primary complaint of  tightness and stiffness in LEs. He has most tightness and limitation in bil gastrocs and hamstrings. He has neuropathy in lower legs that is effecting sensation and discomfort. He does however have good strength preservation in lower legs and feet. Pt with lack of effective HEP for back and LEs. He is doing well with exercising UEs at gym. Pt to benefit from mobility for back and LEs, as well as functional strength for LEs, and pain management techniques for lower legs/neuropathy.  Pt with decreased ability for full functional activities. Pt will  benefit from skilled PT to improve deficits and pain and to return to PLOF.   OBJECTIVE IMPAIRMENTS: decreased activity  tolerance, decreased balance, decreased ROM, decreased strength, increased muscle spasms, impaired flexibility, improper body mechanics, and pain.   ACTIVITY LIMITATIONS: bending, squatting, and locomotion level  PARTICIPATION LIMITATIONS: cleaning, shopping, community activity, and yard work  PERSONAL FACTORS: Time since onset of injury/illness/exacerbation and 1 comorbidity: neuropathy, OA  are also affecting patient's functional outcome.   REHAB POTENTIAL: Good  CLINICAL DECISION MAKING: Evolving/moderate complexity  EVALUATION COMPLEXITY: Moderate   GOALS: Goals reviewed with patient?  Yes   SHORT TERM GOALS: Target date: 06/19/2023   Pt to be independent with initial HEP  Goal status: INITIAL  2.  Pt to verbalize mobility and/or massage/vibration techniques to decrease discomfort with neuropathy  Goal status: INITIAL    LONG TERM GOALS: Target date: 07/31/2023   Pt to be independent with final HEP  Goal status: INITIAL  2.  Pt to demo ability for squat position/hold for 30 sec without pain greater than 2/10 in LEs.   Goal status: INITIAL  3.  Pt to report at least 75 % improvement in overall symptoms.   Goal status: INITIAL  4.  Pt to report decreased discomfort in LEs to 0-4/10 to improve ability for IADLs and exercise.   Goal status: INITIAL   PLAN:  PT FREQUENCY: 1-2x/week  PT DURATION: 8 weeks  PLANNED INTERVENTIONS: Therapeutic exercises, Therapeutic activity, Neuromuscular re-education, Patient/Family education, Self Care, Joint mobilization, Joint manipulation, Stair training, Orthotic/Fit training, DME instructions, Aquatic Therapy, Dry Needling, Electrical stimulation, Cryotherapy, Moist heat, Taping, Ultrasound, Ionotophoresis 4mg /ml Dexamethasone, Manual therapy,  Vasopneumatic device, Traction, Spinal manipulation, Spinal mobilization,Balance training, Gait training,   PLAN FOR NEXT SESSION:  Hip hinge, straight back with low bending, percussion gun for gastrocs.   Sedalia Muta, PT, DPT 12:31 PM  06/20/23

## 2023-06-21 ENCOUNTER — Other Ambulatory Visit: Payer: Self-pay | Admitting: Family Medicine

## 2023-06-25 ENCOUNTER — Ambulatory Visit: Payer: HMO | Admitting: Physical Therapy

## 2023-06-25 ENCOUNTER — Other Ambulatory Visit: Payer: Self-pay | Admitting: Internal Medicine

## 2023-06-25 ENCOUNTER — Encounter: Payer: Self-pay | Admitting: Physical Therapy

## 2023-06-25 DIAGNOSIS — M79604 Pain in right leg: Secondary | ICD-10-CM

## 2023-06-25 DIAGNOSIS — M6281 Muscle weakness (generalized): Secondary | ICD-10-CM | POA: Diagnosis not present

## 2023-06-25 DIAGNOSIS — M79605 Pain in left leg: Secondary | ICD-10-CM

## 2023-06-25 DIAGNOSIS — I731 Thromboangiitis obliterans [Buerger's disease]: Secondary | ICD-10-CM

## 2023-06-25 NOTE — Therapy (Signed)
 OUTPATIENT PHYSICAL THERAPY LOWER EXTREMITY TREATMENT   Patient Name: Stephen Anderson MRN: [id number] DOB:01-10-1941, 83 y.o., male Today's Date: 06/25/2023  END OF SESSION:  PT End of Session - 06/25/23 0758     Visit Number 5    Number of Visits 16    Date for PT Re-Evaluation 07/31/23    Authorization Type HTA    PT Start Time 0800    PT Stop Time 0845    PT Time Calculation (min) 45 min    Activity Tolerance Patient tolerated treatment well    Behavior During Therapy [facility] for tasks assessed/performed             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)   Chest discomfort 04/05/2022   Associated with fatigue worsening 02/2022   Essential tremor    GERD (gastroesophageal reflux disease) 02/24/2018   High risk medication use 04/05/2022   Lyrica for neuropathy     History of melanoma 03/26/2018   S/p excision on back   Hypercholesteremia    Iron deficiency anemia 06/07/2022   Lab Results  Component  Value  Date/Time     HGB  14.8  05/08/2022 09:12 AM     HGB  13.9  03/06/2022 08:44 AM     HGB  11.9 (L)  10/26/2021 08:37 AM     HGB  13.1  06/27/2021 08:58 AM     HGB  14.8  05/24/2020 08:17 AM         Mixed hyperlipidemia 02/24/2018   Peripheral neuropathy    RUQ abdominal pain 05/08/2022   We did ultrasound abdomen 04/26/22 to evaluate pain he has where it feels like right upper quadrant of abdomen pain flared by leaning forward feels like liver tip rolling over but that was negative   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   well diff-right hand- txpbx   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 (CX35FU)   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   in situ-right parotid area   Unintentional weight loss 04/05/2022   Wt Readings from Last 20 Encounters: 04/05/22 170 lb (77.1 kg) 03/06/22 178 lb  12.8 oz (81.1 kg) 10/26/21 168 lb (76.2 kg) 09/21/21 176 lb 2 oz (79.9 kg) 07/05/21 182 lb 6.4 oz (82.7 kg) 06/27/21 179 lb (81.2 kg) 06/01/21 178 lb 12.8 oz (81.1 kg) 01/10/21 174 lb 6.4 oz (79.1 kg) 12/19/20 171 lb (77.6 kg) 08/29/20 178 lb 6.4 oz (80.9 kg) 06/15/20 184 lb 12.8 oz (83.8 kg) 06/02/20 183 lb   Past Surgical History:  Procedure Laterality Date   CATARACT EXTRACTION, BILATERAL     GALLBLADDER SURGERY     HERNIA REPAIR     bilateral   MELANOMA EXCISION     Patient Active Problem List   Diagnosis Date Noted   Trigger finger, right ring finger 09/24/2022   Lumbar back pain 09/05/2022   Arthritis of both hands 09/05/2022   Gastritis 07/05/2022   Hiatal hernia 07/05/2022   Weight loss 06/07/2022   LVH (left ventricular hypertrophy) 06/05/2022   Grade I diastolic dysfunction 06/05/2022   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   Right inguinal hernia 05/21/2022   Aortic atherosclerosis (HCC) 05/21/2022   Bronchiectasis (HCC) 05/21/2022   Recurrent kidney stones 05/21/2022   Calcification of prostate 05/21/2022   DDD (degenerative disc disease), lumbar 05/21/2022  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   RUQ abdominal pain 05/08/2022   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, not believe to be due to lupus or systemic sclerosis 05/08/2022   Kidney cyst, acquired 04/29/2022   Fatty liver 04/29/2022   Chest discomfort 04/05/2022   Former smoker 04/05/2022   High risk medication use 04/05/2022   Chronic fatigue 10/26/2021   RLS (restless legs syndrome) 09/21/2021   Right knee pain 09/15/2020   Pain in joint of right shoulder 09/15/2020   B12 deficiency 05/25/2019   Insomnia 04/23/2018   History of melanoma 03/26/2018   GERD (gastroesophageal reflux disease) 02/24/2018   Mixed hyperlipidemia 02/24/2018   Osteoarthritis, multiple sites 02/24/2018   OSA on CPAP 12/30/2013   Essential tremor 05/16/2012   Idiopathic peripheral  neuropathy 05/16/2012     PCP: Glenetta Hew  REFERRING PROVIDER: Glenetta Hew  REFERRING DIAG: Stiffness in joint, restless leg   THERAPY DIAG:  Pain in left leg  Pain in right leg  Muscle weakness (generalized)  Rationale for Evaluation and Treatment: Rehabilitation  ONSET DATE:    SUBJECTIVE:   SUBJECTIVE STATEMENT: Pt states discomfort in feet and legs last night and while sleeping.   Eval: Pt reports being very active, works out at [employer] 2 x/wk, has been doing treadmill walking and upper body strengthening. He was doing lower body, but legs have felt increasingly sore and uncomfortable, so he has stopped. Reports Neuropathy in lower legs. He feels very stiff in hips and knees. Notes OA in knees. He has muscle soreness in gastrocs and hamstrings. Notes much difficulty with hamstring soreness when staying/working in squat type position. States he has to keep legs moving all the time to stay more comfortable.    PERTINENT HISTORY: Skin ca, restless leg, neuropathy.   PAIN:  Are you having pain? Yes: NPRS scale: up to 6/10 Pain location: bil knee pain Pain description: sore, stiff  Aggravating factors: initial standing Relieving factors: movement  Are you having pain? Yes: NPRS scale: up to 7/10 Pain location: bil lower legs hamstrings and calves Pain description: tight Aggravating factors: none stated  Relieving factors: none stated    PRECAUTIONS: None   WEIGHT BEARING RESTRICTIONS: No  FALLS:  Has patient fallen in last 6 months? No   PLOF: Independent  PATIENT GOALS:  Decreased pain   NEXT MD VISIT:   OBJECTIVE:   DIAGNOSTIC FINDINGS:   PATIENT SURVEYS:   COGNITION: Overall cognitive status: Within functional limits for tasks assessed     SENSATION: WFL  EDEMA:   POSTURE:    No Significant postural limitations  PALPATION:  Hamstrings:  13 in from floor//    3/18: 10 in from floor.  Tightness in hamstrings and gastrocs bilaterally    LOWER EXTREMITY ROM:  Mild limitation for hip flex and mod for ER - more limited on R vs L.  Lumbar: mild/mod limitation for flex/ext and rotation Knees: WFL Ankles: WFL  LOWER EXTREMITY MMT:  MMT Left eval Right  eval  Hip flexion 4 4  Hip extension    Hip abduction 4 4  Hip adduction    Hip internal rotation    Hip external rotation    Knee flexion 4 4  Knee extension 4+ 4+  Ankle dorsiflexion    Ankle plantarflexion Able to do double leg heel raise  Able to do double leg heel raise  Ankle inversion    Ankle eversion     (Blank rows = not tested)  LOWER EXTREMITY SPECIAL TESTS:    GAIT: Unremarkable    TODAY'S TREATMENT:                                                                                                                              DATE:   06/25/2023 Therapeutic Exercise: Aerobic:   Supine: Seated:  Standing:  Stretches:   Seated HSS 30 sec x 3 bil; standing gastroc stretch Seated piriformis 30 sec x 3 bil;  Neuromuscular Re-education: percussion gun to bil lower legs, with education on home use, for decreasing restlessness, improving sensation and proprioception.  Manual Therapy: Therapeutic Activity:   squats at chair 2 x 10 (10lb) ;  Side stepping/squat position 15 ft x 4;  Step ups 6 in, no UE support 2 x 10 bil;  Self Care:    Therapeutic Exercise: Aerobic:  bike L2  x 5 min  Supine: Seated:    Standing:  heel raises 2 x 10 at counter;  Stretches:   Seated HSS 30 sec x 3 bil;   Seated piriformis 30 sec x 3 bil;  Gastroc stretch at wall 30 sec x 3 ea bil;   Neuromuscular Re-education: Manual Therapy: Therapeutic Activity:  sit to stand x 12 (10lb)   squats at chair x 12 (10lb) ;  Dead lift motion x 12 (10lb) cuing for mechanics.  Self Care:   Therapeutic Exercise: Aerobic:  bike L2  x 5 min  Supine: Seated:    Standing: Stretches:   Seated HSS 30 sec x 3 bil;   LTR x 15;  SKTC 30 sec x 3;  piriformis 30 sec x 3 bil;  Gastroc  stretch on step  and at wall 30 sec x 3 ea bil;   Neuromuscular Re-education: Manual Therapy: Therapeutic Activity:  sit to stand x 12,  7lb;   squats at mat table 2 x 5;   Self Care:   PATIENT EDUCATION:  Education details: updated and reviewed HEP Person educated: Patient Education method: Explanation, Demonstration, Tactile cues, Verbal cues, and Handouts Education comprehension: verbalized understanding, returned demonstration, verbal cues required, tactile cues required, and needs further education  HOME EXERCISE PROGRAM: Access Code: [email or web address]   ASSESSMENT:  CLINICAL IMPRESSION: Pt with good tolerance for activity. Education on use of percussion gun at home for improving LE sensation and reducing discomfort form neuropathy. Pt with + outcome with use today, with improved sensation and comfort. Reviewed optimal mechanics for gastroc stretch, and HEP. Pt to benefit from continued care.   Eval: Patient presents with primary complaint of  tightness and stiffness in LEs. He has most tightness and limitation in bil gastrocs and hamstrings. He has neuropathy in lower legs that is effecting sensation and discomfort. He does however have good strength preservation in lower legs and feet. Pt with lack of effective HEP for back and LEs. He is doing well with exercising UEs at gym. Pt to benefit from mobility for back and LEs,  as well as functional strength for LEs, and pain management techniques for lower legs/neuropathy.  Pt with decreased ability for full functional activities. Pt will  benefit from skilled PT to improve deficits and pain and to return to PLOF.   OBJECTIVE IMPAIRMENTS: decreased activity tolerance, decreased balance, decreased ROM, decreased strength, increased muscle spasms, impaired flexibility, improper body mechanics, and pain.   ACTIVITY LIMITATIONS: bending, squatting, and locomotion level  PARTICIPATION LIMITATIONS: cleaning, shopping, community activity, and yard  work  PERSONAL FACTORS: Time since onset of injury/illness/exacerbation and 1 comorbidity: neuropathy, OA  are also affecting patient's functional outcome.   REHAB POTENTIAL: Good  CLINICAL DECISION MAKING: Evolving/moderate complexity  EVALUATION COMPLEXITY: Moderate   GOALS: Goals reviewed with patient? Yes   SHORT TERM GOALS: Target date: 06/19/2023   Pt to be independent with initial HEP  Goal status: INITIAL  2.  Pt to verbalize mobility and/or massage/vibration techniques to decrease discomfort with neuropathy  Goal status: INITIAL    LONG TERM GOALS: Target date: 07/31/2023   Pt to be independent with final HEP  Goal status: INITIAL  2.  Pt to demo ability for squat position/hold for 30 sec without pain greater than 2/10 in LEs.   Goal status: INITIAL  3.  Pt to report at least 75 % improvement in overall symptoms.   Goal status: INITIAL  4.  Pt to report decreased discomfort in LEs to 0-4/10 to improve ability for IADLs and exercise.   Goal status: INITIAL   PLAN:  PT FREQUENCY: 1-2x/week  PT DURATION: 8 weeks  PLANNED INTERVENTIONS: Therapeutic exercises, Therapeutic activity, Neuromuscular re-education, Patient/Family education, Self Care, Joint mobilization, Joint manipulation, Stair training, Orthotic/Fit training, DME instructions, Aquatic Therapy, Dry Needling, Electrical stimulation, Cryotherapy, Moist heat, Taping, Ultrasound, Ionotophoresis 4mg /ml Dexamethasone, Manual therapy,  Vasopneumatic device, Traction, Spinal manipulation, Spinal mobilization,Balance training, Gait training,   PLAN FOR NEXT SESSION:  Hip hinge, straight back with low bending, percussion gun for gastrocs.   Sedalia Muta, PT, DPT 10:47 AM  06/25/23

## 2023-06-27 ENCOUNTER — Encounter: Payer: Self-pay | Admitting: Physical Therapy

## 2023-06-27 ENCOUNTER — Ambulatory Visit: Payer: HMO | Admitting: Physical Therapy

## 2023-06-27 DIAGNOSIS — M6281 Muscle weakness (generalized): Secondary | ICD-10-CM | POA: Diagnosis not present

## 2023-06-27 DIAGNOSIS — M79605 Pain in left leg: Secondary | ICD-10-CM | POA: Diagnosis not present

## 2023-06-27 DIAGNOSIS — M79604 Pain in right leg: Secondary | ICD-10-CM

## 2023-06-27 NOTE — Therapy (Signed)
 OUTPATIENT PHYSICAL THERAPY LOWER EXTREMITY TREATMENT   Patient Name: Stephen Anderson MRN: [id number] DOB:1941-02-03, 83 y.o., male Today's Date: 06/27/2023  END OF SESSION:  PT End of Session - 06/27/23 0802     Visit Number 6    Number of Visits 16    Date for PT Re-Evaluation 07/31/23    Authorization Type HTA    PT Start Time 0803    PT Stop Time 0845    PT Time Calculation (min) 42 min    Activity Tolerance Patient tolerated treatment well    Behavior During Therapy [facility] for tasks assessed/performed             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)   Chest discomfort 04/05/2022   Associated with fatigue worsening 02/2022   Essential tremor    GERD (gastroesophageal reflux disease) 02/24/2018   High risk medication use 04/05/2022   Lyrica for neuropathy     History of melanoma 03/26/2018   S/p excision on back   Hypercholesteremia    Iron deficiency anemia 06/07/2022   Lab Results  Component  Value  Date/Time     HGB  14.8  05/08/2022 09:12 AM     HGB  13.9  03/06/2022 08:44 AM     HGB  11.9 (L)  10/26/2021 08:37 AM     HGB  13.1  06/27/2021 08:58 AM     HGB  14.8  05/24/2020 08:17 AM         Mixed hyperlipidemia 02/24/2018   Peripheral neuropathy    RUQ abdominal pain 05/08/2022   We did ultrasound abdomen 04/26/22 to evaluate pain he has where it feels like right upper quadrant of abdomen pain flared by leaning forward feels like liver tip rolling over but that was negative   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   well diff-right hand- txpbx   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 ([id number])   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   in situ-right parotid area   Unintentional weight loss 04/05/2022   Wt Readings from Last 20 Encounters: 04/05/22 170 lb (77.1 kg) 03/06/22 178 lb  12.8 oz (81.1 kg) 10/26/21 168 lb (76.2 kg) 09/21/21 176 lb 2 oz (79.9 kg) 07/05/21 182 lb 6.4 oz (82.7 kg) 06/27/21 179 lb (81.2 kg) 06/01/21 178 lb 12.8 oz (81.1 kg) 01/10/21 174 lb 6.4 oz (79.1 kg) 12/19/20 171 lb (77.6 kg) 08/29/20 178 lb 6.4 oz (80.9 kg) 06/15/20 184 lb 12.8 oz (83.8 kg) 06/02/20 183 lb   Past Surgical History:  Procedure Laterality Date   CATARACT EXTRACTION, BILATERAL     GALLBLADDER SURGERY     HERNIA REPAIR     bilateral   MELANOMA EXCISION     Patient Active Problem List   Diagnosis Date Noted   Trigger finger, right ring finger 09/24/2022   Lumbar back pain 09/05/2022   Arthritis of both hands 09/05/2022   Gastritis 07/05/2022   Hiatal hernia 07/05/2022   Weight loss 06/07/2022   LVH (left ventricular hypertrophy) 06/05/2022   Grade I diastolic dysfunction 06/05/2022   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   Right inguinal hernia 05/21/2022   Aortic atherosclerosis (HCC) 05/21/2022   Bronchiectasis (HCC) 05/21/2022   Recurrent kidney stones 05/21/2022   Calcification of prostate 05/21/2022   DDD (degenerative disc disease), lumbar 05/21/2022  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   RUQ abdominal pain 05/08/2022   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, not believe to be due to lupus or systemic sclerosis 05/08/2022   Kidney cyst, acquired 04/29/2022   Fatty liver 04/29/2022   Chest discomfort 04/05/2022   Former smoker 04/05/2022   High risk medication use 04/05/2022   Chronic fatigue 10/26/2021   RLS (restless legs syndrome) 09/21/2021   Right knee pain 09/15/2020   Pain in joint of right shoulder 09/15/2020   B12 deficiency 05/25/2019   Insomnia 04/23/2018   History of melanoma 03/26/2018   GERD (gastroesophageal reflux disease) 02/24/2018   Mixed hyperlipidemia 02/24/2018   Osteoarthritis, multiple sites 02/24/2018   OSA on CPAP 12/30/2013   Essential tremor 05/16/2012   Idiopathic peripheral  neuropathy 05/16/2012     PCP: Glenetta Hew  REFERRING PROVIDER: Glenetta Hew  REFERRING DIAG: Stiffness in joint, restless leg   THERAPY DIAG:  Pain in left leg  Pain in right leg  Muscle weakness (generalized)  Rationale for Evaluation and Treatment: Rehabilitation  ONSET DATE:    SUBJECTIVE:   SUBJECTIVE STATEMENT: Pt states less discomfort in feet and legs in the last couple nights, he has been using foot massager in the last couple days and thinks it helps for night pain. He also has been continuing HEP.   Eval: Pt reports being very active, works out at [employer] 2 x/wk, has been doing treadmill walking and upper body strengthening. He was doing lower body, but legs have felt increasingly sore and uncomfortable, so he has stopped. Reports Neuropathy in lower legs. He feels very stiff in hips and knees. Notes OA in knees. He has muscle soreness in gastrocs and hamstrings. Notes much difficulty with hamstring soreness when staying/working in squat type position. States he has to keep legs moving all the time to stay more comfortable.    PERTINENT HISTORY: Skin ca, restless leg, neuropathy.   PAIN:  Are you having pain? Yes: NPRS scale: up to 6/10 Pain location: bil knee pain Pain description: sore, stiff  Aggravating factors: initial standing Relieving factors: movement  Are you having pain? Yes: NPRS scale: up to 7/10 at night  Pain location: bil lower legs hamstrings and calves Pain description: tight Aggravating factors: none stated  Relieving factors: none stated    PRECAUTIONS: None   WEIGHT BEARING RESTRICTIONS: No  FALLS:  Has patient fallen in last 6 months? No   PLOF: Independent  PATIENT GOALS:  Decreased pain   NEXT MD VISIT:   OBJECTIVE:   DIAGNOSTIC FINDINGS:   PATIENT SURVEYS:   COGNITION: Overall cognitive status: Within functional limits for tasks assessed     SENSATION: WFL  EDEMA:   POSTURE:    No Significant postural  limitations  PALPATION:  Hamstrings:  13 in from floor//    3/18: 10 in from floor.  Tightness in hamstrings and gastrocs bilaterally   LOWER EXTREMITY ROM:  Mild limitation for hip flex and mod for ER - more limited on R vs L.  Lumbar: mild/mod limitation for flex/ext and rotation Knees: WFL Ankles: WFL  LOWER EXTREMITY MMT:  MMT Left eval Right  eval  Hip flexion 4 4  Hip extension    Hip abduction 4 4  Hip adduction    Hip internal rotation    Hip external rotation    Knee flexion 4 4  Knee extension 4+ 4+  Ankle dorsiflexion    Ankle plantarflexion Able to do double leg  heel raise  Able to do double leg heel raise  Ankle inversion    Ankle eversion     (Blank rows = not tested)  LOWER EXTREMITY SPECIAL TESTS:    GAIT: Unremarkable    TODAY'S TREATMENT:                                                                                                                              DATE:   06/27/2023 Therapeutic Exercise: Aerobic:   bike L2  x 7 min  Supine: Seated:  Standing:  Stretches:   Seated HSS 30 sec x 3 bil;  standing gastroc stretch-review for HEP;  Seated Piriformis 30 sec x 2 bil;  Seated piriformis 30 sec x 3 bil;  Neuromuscular Re-education:  Manual Therapy: Therapeutic Activity:   squats at chair 2 x 10 ;  Step ups 6 in, no UE support 2 x 10 bil;  Marching/slow for balance with stair climbing 2 x 10;  Self Care:  Education on continuing Final HEP for LE mobility, [facility], and continuing either foot massager or purchasing percussion gun for sensation in LEs to help sensation and pain.    Therapeutic Exercise: Aerobic:  bike L2  x 5 min  Supine: Seated:    Standing:  heel raises 2 x 10 at counter;  Stretches:   Seated HSS 30 sec x 3 bil;   Seated piriformis 30 sec x 3 bil;  Gastroc stretch at wall 30 sec x 3 ea bil;   Neuromuscular Re-education: Manual Therapy: Therapeutic Activity:  sit to stand x 12 (10lb)    squats at chair x 12 (10lb) ;  Dead lift motion x 12 (10lb) cuing for mechanics.  Self Care:   Therapeutic Exercise: Aerobic:  bike L2  x 5 min  Supine: Seated:    Standing: Stretches:   Seated HSS 30 sec x 3 bil;   LTR x 15;  SKTC 30 sec x 3;  piriformis 30 sec x 3 bil;  Gastroc stretch on step  and at wall 30 sec x 3 ea bil;   Neuromuscular Re-education: Manual Therapy: Therapeutic Activity:  sit to stand x 12,  7lb;   squats at mat table 2 x 5;   Self Care:   PATIENT EDUCATION:  Education details: updated and reviewed HEP Person educated: Patient Education method: Explanation, Demonstration, Tactile cues, Verbal cues, and Handouts Education comprehension: verbalized understanding, returned demonstration, verbal cues required, tactile cues required, and needs further education  HOME EXERCISE PROGRAM: Access Code: [email or web address]   ASSESSMENT:  CLINICAL IMPRESSION: Pt with good tolerance for activity. Continued education on best ther ex for LE mobility and strengthening. [facility]. He will continue HEP daily, and discussed either foot massager or percussion gun for discomfort in lower legs. Pt doing well managing symptoms at this time. Pt ready for d/c to HEP- pt in agreement  with plan.   Eval: Patient presents with primary complaint of  tightness and stiffness in LEs. He has most tightness and limitation in bil gastrocs and hamstrings. He has neuropathy in lower legs that is effecting sensation and discomfort. He does however have good strength preservation in lower legs and feet. Pt with lack of effective HEP for back and LEs. He is doing well with exercising UEs at gym. Pt to benefit from mobility for back and LEs, as well as functional strength for LEs, and pain management techniques for lower legs/neuropathy.  Pt with decreased ability for full functional activities. Pt will  benefit from skilled PT to improve deficits and pain and to  return to PLOF.   OBJECTIVE IMPAIRMENTS: decreased activity tolerance, decreased balance, decreased ROM, decreased strength, increased muscle spasms, impaired flexibility, improper body mechanics, and pain.   ACTIVITY LIMITATIONS: bending, squatting, and locomotion level  PARTICIPATION LIMITATIONS: cleaning, shopping, community activity, and yard work  PERSONAL FACTORS: Time since onset of injury/illness/exacerbation and 1 comorbidity: neuropathy, OA  are also affecting patient's functional outcome.   REHAB POTENTIAL: Good  CLINICAL DECISION MAKING: Evolving/moderate complexity  EVALUATION COMPLEXITY: Moderate   GOALS: Goals reviewed with patient? Yes   SHORT TERM GOALS: Target date: 06/19/2023   Pt to be independent with initial HEP  Goal status: MET  2.  Pt to verbalize mobility and/or massage/vibration techniques to decrease discomfort with neuropathy  Goal status: MET    LONG TERM GOALS: Target date: 07/31/2023   Pt to be independent with final HEP  Goal status: MET  2.  Pt to demo ability for squat position/hold for 30 sec without pain greater than 2/10 in LEs.   Goal status: MET  3.  Pt to report at least 75 % improvement in overall symptoms.   Goal status: MET  4.  Pt to report decreased discomfort in LEs to 0-4/10 to improve ability for IADLs and exercise.   Goal status: partially met - gets discomfort from restless leg in evenings.    PLAN:  PT FREQUENCY: 1-2x/week  PT DURATION: 8 weeks  PLANNED INTERVENTIONS: Therapeutic exercises, Therapeutic activity, Neuromuscular re-education, Patient/Family education, Self Care, Joint mobilization, Joint manipulation, Stair training, Orthotic/Fit training, DME instructions, Aquatic Therapy, Dry Needling, Electrical stimulation, Cryotherapy, Moist heat, Taping, Ultrasound, Ionotophoresis 4mg /ml Dexamethasone, Manual therapy,  Vasopneumatic device, Traction, Spinal manipulation, Spinal mobilization,Balance  training, Gait training,   PLAN FOR NEXT SESSION:  Hip hinge, straight back with low bending, percussion gun for gastrocs.   Sedalia Muta, PT, DPT 11:18 AM  06/27/23   PHYSICAL THERAPY DISCHARGE SUMMARY  Visits from Start of Care: 6   Plan: Patient agrees to discharge.  Patient goals were met. Patient is being discharged due to meeting the stated rehab goals.     Sedalia Muta, PT, DPT 11:18 AM  06/27/23

## 2023-07-02 ENCOUNTER — Encounter: Payer: HMO | Admitting: Physical Therapy

## 2023-07-04 ENCOUNTER — Encounter: Payer: HMO | Admitting: Physical Therapy

## 2023-07-09 ENCOUNTER — Encounter: Payer: HMO | Admitting: Physical Therapy

## 2023-07-11 ENCOUNTER — Encounter: Payer: HMO | Admitting: Physical Therapy

## 2023-07-16 DIAGNOSIS — L244 Irritant contact dermatitis due to drugs in contact with skin: Secondary | ICD-10-CM | POA: Diagnosis not present

## 2023-07-21 ENCOUNTER — Other Ambulatory Visit: Payer: Self-pay | Admitting: Internal Medicine

## 2023-07-21 DIAGNOSIS — G609 Hereditary and idiopathic neuropathy, unspecified: Secondary | ICD-10-CM

## 2023-07-29 ENCOUNTER — Other Ambulatory Visit: Payer: Self-pay | Admitting: Internal Medicine

## 2023-07-29 DIAGNOSIS — I7300 Raynaud's syndrome without gangrene: Secondary | ICD-10-CM

## 2023-07-29 DIAGNOSIS — I731 Thromboangiitis obliterans [Buerger's disease]: Secondary | ICD-10-CM

## 2023-07-29 DIAGNOSIS — I739 Peripheral vascular disease, unspecified: Secondary | ICD-10-CM

## 2023-07-29 DIAGNOSIS — R1011 Right upper quadrant pain: Secondary | ICD-10-CM

## 2023-07-31 ENCOUNTER — Ambulatory Visit (INDEPENDENT_AMBULATORY_CARE_PROVIDER_SITE_OTHER): Payer: HMO | Admitting: Internal Medicine

## 2023-07-31 ENCOUNTER — Encounter: Payer: Self-pay | Admitting: Internal Medicine

## 2023-07-31 VITALS — BP 128/74 | HR 79 | Wt 177.0 lb

## 2023-07-31 DIAGNOSIS — G609 Hereditary and idiopathic neuropathy, unspecified: Secondary | ICD-10-CM | POA: Diagnosis not present

## 2023-07-31 DIAGNOSIS — E782 Mixed hyperlipidemia: Secondary | ICD-10-CM | POA: Diagnosis not present

## 2023-07-31 DIAGNOSIS — I731 Thromboangiitis obliterans [Buerger's disease]: Secondary | ICD-10-CM

## 2023-07-31 DIAGNOSIS — G629 Polyneuropathy, unspecified: Secondary | ICD-10-CM | POA: Diagnosis not present

## 2023-07-31 DIAGNOSIS — N182 Chronic kidney disease, stage 2 (mild): Secondary | ICD-10-CM | POA: Diagnosis not present

## 2023-07-31 DIAGNOSIS — G2581 Restless legs syndrome: Secondary | ICD-10-CM

## 2023-07-31 DIAGNOSIS — M150 Primary generalized (osteo)arthritis: Secondary | ICD-10-CM | POA: Diagnosis not present

## 2023-07-31 LAB — COMPREHENSIVE METABOLIC PANEL WITH GFR
ALT: 14 U/L (ref 0–53)
AST: 16 U/L (ref 0–37)
Albumin: 4.4 g/dL (ref 3.5–5.2)
Alkaline Phosphatase: 59 U/L (ref 39–117)
BUN: 27 mg/dL — ABNORMAL HIGH (ref 6–23)
CO2: 28 meq/L (ref 19–32)
Calcium: 8.8 mg/dL (ref 8.4–10.5)
Chloride: 104 meq/L (ref 96–112)
Creatinine, Ser: 1.18 mg/dL (ref 0.40–1.50)
GFR: 58.03 mL/min — ABNORMAL LOW (ref >60.00)
Glucose, Bld: 101 mg/dL — ABNORMAL HIGH (ref 70–99)
Potassium: 4.2 meq/L (ref 3.5–5.1)
Sodium: 138 meq/L (ref 135–145)
Total Bilirubin: 0.6 mg/dL (ref 0.2–1.2)
Total Protein: 6.7 g/dL (ref 6.0–8.3)

## 2023-07-31 LAB — CBC WITH DIFFERENTIAL/PLATELET
Basophils Absolute: 0 10*3/uL (ref 0.0–0.1)
Basophils Relative: 0.6 % (ref 0.0–3.0)
Eosinophils Absolute: 0.2 10*3/uL (ref 0.0–0.7)
Eosinophils Relative: 2.3 % (ref 0.0–5.0)
HCT: 43.5 % (ref 39.0–52.0)
Hemoglobin: 14.5 g/dL (ref 13.0–17.0)
Lymphocytes Relative: 24.9 % (ref 12.0–46.0)
Lymphs Abs: 1.7 10*3/uL (ref 0.7–4.0)
MCHC: 33.3 g/dL (ref 30.0–36.0)
MCV: 91.7 fl (ref 78.0–100.0)
Monocytes Absolute: 0.5 10*3/uL (ref 0.1–1.0)
Monocytes Relative: 7.5 % (ref 3.0–12.0)
Neutro Abs: 4.5 10*3/uL (ref 1.4–7.7)
Neutrophils Relative %: 64.7 % (ref 43.0–77.0)
Platelets: 167 10*3/uL (ref 150.0–400.0)
RBC: 4.75 Mil/uL (ref 4.22–5.81)
RDW: 14 % (ref 11.5–15.5)
WBC: 7 10*3/uL (ref 4.0–10.5)

## 2023-07-31 LAB — PHOSPHORUS: Phosphorus: 2.6 mg/dL (ref 2.3–4.6)

## 2023-07-31 LAB — URIC ACID: Uric Acid, Serum: 5.8 mg/dL (ref 4.0–7.8)

## 2023-07-31 LAB — VITAMIN B12: Vitamin B-12: 483 pg/mL (ref 211–911)

## 2023-07-31 LAB — SEDIMENTATION RATE: Sed Rate: 4 mm/h (ref 0–20)

## 2023-07-31 LAB — VITAMIN D 25 HYDROXY (VIT D DEFICIENCY, FRACTURES): VITD: 47.95 ng/mL (ref 30.00–100.00)

## 2023-07-31 LAB — C-REACTIVE PROTEIN: CRP: <1 mg/dL (ref 0.5–20.0)

## 2023-07-31 MED ORDER — PREGABALIN 200 MG PO CAPS: 200.0000 mg | ORAL_CAPSULE | Freq: Two times a day (BID) | ORAL | 5 refills | Status: DC

## 2023-07-31 MED ORDER — DICLOFENAC SODIUM 1 % EX GEL: 2.0000 g | Freq: Two times a day (BID) | CUTANEOUS | 11 refills | Status: DC

## 2023-07-31 NOTE — Assessment & Plan Note (Signed)
 Assessment: Patient presents with worsening bilateral lower extremity neuropathic symptoms persisting for over 10 years. Symptoms include burning sensations, partial sensory loss, and numbness primarily affecting the legs and feet. Pain averages 6/10, peaks at 10/10, and significantly impacts daily functioning. 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 Current management with pregabalin  150mg  BID provides partial relief, but symptoms are progressing. Plan: Increase pregabalin  (Lyrica ) to 200mg  BID (from current 100mg  daily per documented prescription or 150mg  BID per patient report) Continue lidocaine  5% ointment with insurance authorization for increased quantity (400g monthly) Referral to Pain Medicine for evaluation of advanced pain management options including possible nerve blocks Referral to Podiatry for comprehensive foot evaluation and management recommendations Consider trial of alpha-lipoic acid supplementation to continue current regimen Comprehensive lab workup including B12 levels, methylmalonic acid, ceruloplasmin, zinc, protein electrophoresis, and inflammatory markers to investigate underlying causes Follow up in 3 months to reassess symptoms and response to medication changes

## 2023-07-31 NOTE — Assessment & Plan Note (Signed)
 Assessment: History of Buerger's disease with Raynaud's symptoms, primarily affecting fingertips. Previous workup negative for systemic inflammatory disorders. Current management includes nifedipine . May have vascular contribution to lower extremity symptoms. Plan: Continue nifedipine  for Raynaud's symptom management Consider cilostazol  for peripheral vascular disease component of symptoms Reinforce importance of avoiding cold exposure to extremities Check vascular status of lower extremities during examination

## 2023-07-31 NOTE — Progress Notes (Signed)
 ==============================  [facility]: [phone]   -- Medical Office Visit --  Patient: Stephen Anderson      Age: 83 y.o.       Sex:  male  Date:   07/31/2023 Today's Healthcare Provider: Anthon Kins, MD  ==============================   Chief Complaint: Leg Pain   History of Present Illness Patient is an 83 year old male with a complex medical history who presents today for follow-up on kidney monitoring and worsening leg pain. His kidney function has been stable, but he prefers regular monitoring every 3-4 months. It has been 5 months since his last kidney function assessment, and he is seeking blood work today. The patient reports progressive worsening of bilateral lower extremity neuropathic symptoms that began 10-15 years ago and developed over 2-5 years. He describes burning sensations in both legs with partial loss of sensation and numbness in his feet. The pain, which averages 6/10 and peaks at 10/10, is characterized as a constant burning sensation affecting both legs, especially the shins and feet. He previously had a negative vascular evaluation in February 2024. He has been using 5% lidocaine  ointment for pain management, going through approximately one tube weekly, but reports minimal relief. He expresses concern about insurance coverage for the ointment and worries that using large amounts may strain his kidneys, which is why he is monitoring his kidney function closely. The patient is currently taking pregabalin  (Lyrica ) 150mg  twice daily, which he finds helpful. He notes that missing a dose results in increased pain, confirming the medication's efficacy. He previously tried gabapentin  before switching to Lyrica , and attempted Voltaren  cream which made the area "too numb." He also uses a percussion gun on his legs for temporary relief. The neuropathic symptoms significantly impact his daily activities. He reports balance issues  and unsteadiness, though he remains mobile without assistive devices. He experiences exacerbation of pain with prolonged sitting, such as in church, and finds relief with movement and low-impact exercise. He notes particular pain in his hands after activities such as washing hands and jarring peanuts, for which he uses topical treatments. The patient has history of B12 deficiency, for which he takes supplements. He also takes fish oil, two capsules each morning and night. He attributes some of his symptoms to past chemical exposure, particularly to Keel's oil-based paint primer, noting inadequate personal protective equipment during exposure. He enjoys fishing despite the discomfort it causes. He reports swelling around his ankles, particularly on the right side, but denies significant changes in blood flow to his legs. He notes that his feet become especially sore after standing for long periods. 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     Background Reviewed: Problem List: has Essential tremor; Idiopathic peripheral neuropathy; OSA on CPAP; GERD (gastroesophageal reflux disease); Mixed hyperlipidemia; Osteoarthritis, multiple sites; History of melanoma; Insomnia; B12 deficiency; RLS (restless legs syndrome); Chronic fatigue; Right knee pain; Pain in joint of right shoulder; Chest discomfort; Former smoker; High risk medication use; Kidney cyst, acquired; Fatty liver; Thromboangiitis obliterans (Buerger's disease) (HCC); RUQ abdominal pain; Buerger's disease  (HCC) with Raynauds without Gangrene, not believe to be due to lupus or systemic sclerosis; Diverticular  disease; Right inguinal hernia; Aortic atherosclerosis (HCC); Bronchiectasis (HCC); Recurrent kidney stones; Calcification of prostate; DDD (degenerative disc disease), lumbar; LVH (left ventricular hypertrophy); Grade I diastolic dysfunction; Weight loss; Gastritis; Hiatal hernia; Lumbar back pain; Arthritis of both hands; and Trigger finger, right ring finger on their problem list. Medications:  has a current medication list which includes the following prescription(s): acetaminophen, alpha-lipoic acid, amoxicillin -clavulanate, ascorbic acid, aspirin, atorvastatin , caffeine, celecoxib , vitamin d -3, cilostazol , clobetasol propionate, cyanocobalamin , diclofenac  sodium, ferrous sulfate, fluticasone , fluticasone , ipratropium, ketoconazole , lidocaine , magnesium, nifedipine , nifedipine , NON FORMULARY, omega-3 acid ethyl esters, omega-3 acid ethyl esters, pantoprazole , pregabalin , primidone , triamcinolone  cream, UNABLE TO FIND, and neomycin -polymyxin-hydrocortisone.  Allergies:  is allergic to hydrocodone, oxycodone, oxycontin [oxycodone hcl], and simvastatin.  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 Past Surgical History:   has a past surgical history that includes Gallbladder surgery; Hernia repair; Cataract extraction, bilateral; and Melanoma  excision. Social History:   reports that he quit smoking about 15 years ago. His smoking use included cigarettes. He started smoking about 65 years ago. He has a 50 pack-year smoking history. He has never been exposed to tobacco smoke. He quit smokeless tobacco use about 21 years ago. He reports that he does not drink alcohol and does not use drugs. Family History:  family history includes Bladder Cancer in his brother; Heart disease in his brother; Lung cancer in his father; Neuropathy in his daughter. Depression Screen and Health Maintenance:    05/17/2023    8:48 AM 05/01/2023    8:35 AM 03/27/2023    8:50 AM 01/16/2023    8:45 AM  PHQ 2/9 Scores  PHQ - 2 Score 0 0 0 1  PHQ- 9 Score  2 1 4     Medication Reconciliation: Current Outpatient Medications on File Prior to Visit  Medication Sig   Acetaminophen (TYLENOL ARTHRITIS EXT RELIEF PO) Take 1 tablet by mouth every other day.   Alpha-Lipoic Acid 300 MG TABS Take 2 tablets (600 mg total) by mouth daily at 6 (six) AM.   amoxicillin -clavulanate (AUGMENTIN ) 875-125 MG tablet Take 1 tablet by mouth 2 (two) times daily.   Ascorbic Acid (VITAMIN C PO) Take 500 mg by mouth daily.   aspirin 81 MG tablet Take 81 mg by mouth every other day.    atorvastatin  (LIPITOR) 40 MG tablet TAKE 1 TABLET BY MOUTH AT BEDTIME   caffeine 200 MG TABS tablet Take 200 mg by mouth in the morning and at bedtime. "STAY AWAKE"   celecoxib  (CELEBREX ) 200 MG capsule Take 1 capsule (200 mg total) by mouth daily.   Cholecalciferol (VITAMIN D -3) 125 MCG (5000 UT) TABS Take 1 tablet by mouth daily.   cilostazol  (PLETAL ) 50 MG tablet TAKE 1 TABLET BY MOUTH 2 TIMES A DAY   Clobetasol Propionate 0.05 % shampoo    Cyanocobalamin  (B-12 PO) Take 1 tablet by mouth daily.   Ferrous Sulfate (IRON PO) Take 65 mg by mouth daily.   fluticasone  (CUTIVATE ) 0.05 % cream    fluticasone  (FLONASE ) 50 MCG/ACT nasal spray Place 1 spray into both nostrils daily. (Patient taking differently:  Place 1 spray into both nostrils  daily. As needed.)   ipratropium (ATROVENT ) 0.06 % nasal spray Place 1-2 sprays into both nostrils 4 (four) times daily. As needed for nasal congestion   ketoconazole  (NIZORAL ) 2 % shampoo Apply to affected area- let sit 3-5 minutes before rinsing   lidocaine  (XYLOCAINE ) 5 % ointment Apply 1 Application topically as needed.   MAGNESIUM PO Take 400 mg by mouth daily.   NIFEdipine  (PROCARDIA  XL) 60 MG 24 hr tablet Take 1 tablet (60 mg total) by mouth daily. Dose increased to help improve blood flow and energy. (Patient taking differently: Take 60 mg by mouth daily. Dose increased to help improve blood flow and energy. Patient reported taking 30 mg.)   NIFEdipine  (PROCARDIA -XL/NIFEDICAL-XL) 30 MG 24 hr tablet TAKE 1 TABLET BY MOUTH DAILY   NON FORMULARY CPAP   omega-3 acid ethyl esters (LOVAZA ) 1 g capsule Take 2 capsules (2 g total) by mouth 2 (two) times daily.   omega-3 acid ethyl esters (LOVAZA ) 1 g capsule TAKE 2 CAPSULES BY MOUTH IN THE MORNING, AT NOON, AND AT BEDTIME   pantoprazole  (PROTONIX ) 40 MG tablet Take 1 tablet (40 mg total) by mouth daily.   primidone  (MYSOLINE ) 50 MG tablet TAKE ONE TABLET BY MOUTH EVERY MORNING AND ONE TABLET EVERY EVENING   triamcinolone  cream (KENALOG ) 0.1 % Apply 1 Application topically 2 (two) times daily. Up to 14 days use   UNABLE TO FIND 100 mg in the morning and at bedtime. Out back oil.   neomycin -polymyxin-hydrocortisone (CORTISPORIN) OTIC solution Place 3 drops into the left ear 3 (three) times daily for 3 days.   No current facility-administered medications on file prior to visit.   Medications Discontinued During This Encounter  Medication Reason   pregabalin  (LYRICA ) 100 MG capsule    diclofenac  Sodium (VOLTAREN ) 1 % GEL Reorder        Physical Exam:    07/31/2023    8:29 AM 06/10/2023    9:11 AM 05/17/2023    8:42 AM  Vitals with BMI  Height  5' 9.5" 5' 9.5"  Weight 177 lbs 180 lbs 13 oz 175 lbs 10 oz  BMI   26.33 25.57  Systolic 128 125 098  Diastolic 74 60 62  Pulse 79 61 67   Wt Readings from Last 10 Encounters:  07/31/23 177 lb (80.3 kg)  06/10/23 180 lb 12.8 oz (82 kg)  05/17/23 175 lb 9.6 oz (79.7 kg)  05/01/23 176 lb 3.2 oz (79.9 kg)  03/27/23 173 lb 3.2 oz (78.6 kg)  01/16/23 173 lb (78.5 kg)  12/06/22 174 lb 12.8 oz (79.3 kg)  10/19/22 176 lb 3.2 oz (79.9 kg)  09/24/22 176 lb (79.8 kg)  09/05/22 176 lb 6.4 oz (80 kg)  Vital signs reviewed.  Nursing notes reviewed. Weight trend reviewed. Physical Exam  Physical Exam EXTREMITIES: Pulses in feet are normal. Mild edema around ankles, more pronounced on the right. Photographs Taken 07/31/2023 :      General Appearance:  No acute distress appreciable.   Well-groomed, healthy-appearing male.  Well proportioned with no abnormal fat distribution.  Good muscle tone. Pulmonary:  Normal work of breathing at rest, no respiratory distress apparent. SpO2: 98 %  Musculoskeletal: All extremities are intact.  Neurological:  Awake, alert, oriented, and engaged.  No obvious focal neurological deficits or cognitive impairments.  Sensorium seems unclouded.   Speech is clear and coherent with logical content. Psychiatric:  Appropriate mood, pleasant and cooperative demeanor, thoughtful and engaged during the exam  Results for orders placed or performed in visit on 07/31/23  CBC with Differential/Platelet  Result Value Ref Range   WBC 7.0 4.0 - 10.5 K/uL   RBC 4.75 4.22 - 5.81 Mil/uL   Hemoglobin 14.5 13.0 - 17.0 g/dL   HCT 29.5 28.4 - 13.2 %   MCV 91.7 78.0 - 100.0 fl   MCHC 33.3 30.0 - 36.0 g/dL   RDW 44.0 10.2 - 72.5 %   Platelets 167.0 150.0 - 400.0 K/uL   Neutrophils Relative % 64.7 43.0 - 77.0 %   Lymphocytes Relative 24.9 12.0 - 46.0 %   Monocytes Relative 7.5 3.0 - 12.0 %   Eosinophils Relative 2.3 0.0 - 5.0 %   Basophils Relative 0.6 0.0 - 3.0 %   Neutro Abs 4.5 1.4 - 7.7 K/uL   Lymphs Abs 1.7 0.7 - 4.0 K/uL   Monocytes  Absolute 0.5 0.1 - 1.0 K/uL   Eosinophils Absolute 0.2 0.0 - 0.7 K/uL   Basophils Absolute 0.0 0.0 - 0.1 K/uL  Comprehensive metabolic panel with GFR  Result Value Ref Range   Sodium 138 135 - 145 mEq/L   Potassium 4.2 3.5 - 5.1 mEq/L   Chloride 104 96 - 112 mEq/L   CO2 28 19 - 32 mEq/L   Glucose, Bld 101 (H) 70 - 99 mg/dL   BUN 27 (H) 6 - 23 mg/dL   Creatinine, Ser 3.66 0.40 - 1.50 mg/dL   Total Bilirubin 0.6 0.2 - 1.2 mg/dL   Alkaline Phosphatase 59 39 - 117 U/L   AST 16 0 - 37 U/L   ALT 14 0 - 53 U/L   Total Protein 6.7 6.0 - 8.3 g/dL   Albumin 4.4 3.5 - 5.2 g/dL   GFR 44.03 (L) >47.42 mL/min   Calcium  8.8 8.4 - 10.5 mg/dL  Phosphorus  Result Value Ref Range   Phosphorus 2.6 2.3 - 4.6 mg/dL  Vitamin D  (25 hydroxy)  Result Value Ref Range   VITD 47.95 30.00 - 100.00 ng/mL  Uric acid  Result Value Ref Range   Uric Acid, Serum 5.8 4.0 - 7.8 mg/dL  V95  Result Value Ref Range   Vitamin B-12 483 211 - 911 pg/mL  Sedimentation rate  Result Value Ref Range   Sed Rate 4 0 - 20 mm/hr  C-reactive protein  Result Value Ref Range   CRP <1.0 0.5 - 20.0 mg/dL   Office Visit on 63/87/5643  Component Date Value   WBC 07/31/2023 7.0    RBC 07/31/2023 4.75    Hemoglobin 07/31/2023 14.5    HCT 07/31/2023 43.5    MCV 07/31/2023 91.7    MCHC 07/31/2023 33.3    RDW 07/31/2023 14.0    Platelets 07/31/2023 167.0    Neutrophils Relative % 07/31/2023 64.7    Lymphocytes Relative 07/31/2023 24.9    Monocytes Relative 07/31/2023 7.5    Eosinophils Relative 07/31/2023 2.3    Basophils Relative 07/31/2023 0.6    Neutro Abs 07/31/2023 4.5    Lymphs Abs 07/31/2023 1.7    Monocytes Absolute 07/31/2023 0.5    Eosinophils Absolute 07/31/2023 0.2    Basophils Absolute 07/31/2023 0.0    Sodium 07/31/2023 138    Potassium 07/31/2023 4.2    Chloride 07/31/2023 104    CO2 07/31/2023 28    Glucose, Bld 07/31/2023 101 (H)    BUN 07/31/2023 27 (H)    Creatinine, Ser 07/31/2023 1.18    Total  Bilirubin 07/31/2023 0.6    Alkaline Phosphatase 07/31/2023  59    AST 07/31/2023 16    ALT 07/31/2023 14    Total Protein 07/31/2023 6.7    Albumin 07/31/2023 4.4    GFR 07/31/2023 58.03 (L)    Calcium  07/31/2023 8.8    Phosphorus 07/31/2023 2.6    VITD 07/31/2023 47.95    Uric Acid, Serum 07/31/2023 5.8    Vitamin B-12 07/31/2023 483    Sed Rate 07/31/2023 4    CRP 07/31/2023 <1.0   Office Visit on 03/27/2023  Component Date Value   Vitamin B-12 03/27/2023 1,492 (H)    Folate 03/27/2023 16.0    Hgb A1c MFr Bld 03/27/2023 5.9    Ferritin 03/27/2023 53.8    Sodium 03/27/2023 142    Potassium 03/27/2023 4.3    Chloride 03/27/2023 105    CO2 03/27/2023 30    Glucose, Bld 03/27/2023 109 (H)    BUN 03/27/2023 21    Creatinine, Ser 03/27/2023 1.03    Total Bilirubin 03/27/2023 0.5    Alkaline Phosphatase 03/27/2023 68    AST 03/27/2023 17    ALT 03/27/2023 14    Total Protein 03/27/2023 6.8    Albumin 03/27/2023 4.5    GFR 03/27/2023 68.48    Calcium  03/27/2023 8.9    WBC 03/27/2023 6.3    RBC 03/27/2023 4.74    Hemoglobin 03/27/2023 14.6    HCT 03/27/2023 43.8    MCV 03/27/2023 92.3    MCHC 03/27/2023 33.3    RDW 03/27/2023 14.0    Platelets 03/27/2023 185.0    Neutrophils Relative % 03/27/2023 60.8    Lymphocytes Relative 03/27/2023 27.2    Monocytes Relative 03/27/2023 9.0    Eosinophils Relative 03/27/2023 2.3    Basophils Relative 03/27/2023 0.7    Neutro Abs 03/27/2023 3.8    Lymphs Abs 03/27/2023 1.7    Monocytes Absolute 03/27/2023 0.6    Eosinophils Absolute 03/27/2023 0.1    Basophils Absolute 03/27/2023 0.0    Cholesterol 03/27/2023 123    Triglycerides 03/27/2023 81.0    HDL 03/27/2023 41.00    VLDL 03/27/2023 16.2    LDL Cholesterol 03/27/2023 65    Total CHOL/HDL Ratio 03/27/2023 3    NonHDL 03/27/2023 81.51    VITD 03/27/2023 34.36    Anti Nuclear Antibody (A* 03/27/2023 Negative    Rheumatoid fact SerPl-aC* 03/27/2023 <10    T3 Uptake  03/27/2023 28    T4, Total 03/27/2023 7.5    Free Thyroxine Index 03/27/2023 2.1    TSH 03/27/2023 1.05    Arsenic, 24H Ur 04/04/2023 <10    Lead, 24 hr urine 04/04/2023 <10    Mercury, 24H Ur 04/04/2023 <4   Office Visit on 09/24/2022  Component Date Value   Rheumatoid fact SerPl-aC* 09/24/2022 <10    Sed Rate 09/24/2022 2    Scleroderma (Scl-70) (EN* 09/24/2022 <1.0 NEG    Ribonucleic Protein(ENA)* 09/24/2022 <1.0 NEG    ENA SM Ab Ser-aCnc 09/24/2022 <1.0 NEG    SSA (Ro) (ENA) Antibody,* 09/24/2022 <1.0 NEG    ds DNA Ab 09/24/2022 1    C3 Complement 09/24/2022 124    C4 Complement 09/24/2022 21   Office Visit on 09/05/2022  Component Date Value   WBC 09/05/2022 6.9    RBC 09/05/2022 4.71    Hemoglobin 09/05/2022 14.1    HCT 09/05/2022 42.4    MCV 09/05/2022 90.2    MCHC 09/05/2022 33.2    RDW 09/05/2022 14.9    Platelets 09/05/2022 158.0    Neutrophils Relative %  09/05/2022 62.2    Lymphocytes Relative 09/05/2022 25.1    Monocytes Relative 09/05/2022 9.9    Eosinophils Relative 09/05/2022 2.2    Basophils Relative 09/05/2022 0.6    Neutro Abs 09/05/2022 4.3    Lymphs Abs 09/05/2022 1.7    Monocytes Absolute 09/05/2022 0.7    Eosinophils Absolute 09/05/2022 0.2    Basophils Absolute 09/05/2022 0.0    Ferritin 09/05/2022 29.4    Cholesterol 09/05/2022 123    Triglycerides 09/05/2022 160.0 (H)    HDL 09/05/2022 34.60 (L)    VLDL 09/05/2022 32.0    LDL Cholesterol 09/05/2022 57    Total CHOL/HDL Ratio 09/05/2022 4    NonHDL 09/05/2022 88.83    Sodium 09/05/2022 142    Potassium 09/05/2022 4.4    Chloride 09/05/2022 107    CO2 09/05/2022 28    Glucose, Bld 09/05/2022 101 (H)    BUN 09/05/2022 19    Creatinine, Ser 09/05/2022 1.17    Total Bilirubin 09/05/2022 0.4    Alkaline Phosphatase 09/05/2022 73    AST 09/05/2022 18    ALT 09/05/2022 15    Total Protein 09/05/2022 6.5    Albumin 09/05/2022 4.0    GFR 09/05/2022 59.00 (L)    Calcium  09/05/2022 8.6    VITD  09/05/2022 46.57    Microalb, Ur 09/05/2022 1.8    Creatinine,U 09/05/2022 97.2    Microalb Creat Ratio 09/05/2022 1.9    Magnesium 09/05/2022 1.9    Phosphorus 09/05/2022 2.1 (L)    Uric Acid, Serum 09/05/2022 5.7   No image results found. No results found.    Results      Assessment & Plan Idiopathic peripheral neuropathy Assessment: Patient presents with worsening bilateral lower extremity neuropathic symptoms persisting for over 10 years. Symptoms include burning sensations, partial sensory loss, and numbness primarily affecting the legs and feet. Pain averages 6/10, peaks at 10/10, and significantly impacts daily functioning. 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 Current management with pregabalin  150mg  BID provides partial relief, but symptoms are progressing. Plan: Increase pregabalin  (Lyrica ) to 200mg  BID (from current 100mg  daily per documented prescription or 150mg  BID per patient report) Continue lidocaine  5% ointment with insurance authorization for increased quantity (400g monthly) Referral to Pain Medicine for evaluation of advanced pain management options including possible nerve blocks Referral to Podiatry for comprehensive foot evaluation and management recommendations Consider trial of alpha-lipoic acid supplementation to continue current regimen Comprehensive lab workup including B12 levels, methylmalonic acid, ceruloplasmin, zinc, protein electrophoresis, and inflammatory markers to investigate underlying causes Follow up in 3 months to reassess symptoms and response to medication changes Primary osteoarthritis involving multiple joints Assessment: Patient with osteoarthritis affecting multiple joints including hands. Reports significant pain in hands after activities such as washing hands and jarring  peanuts. Plan: Continue celecoxib  200mg  daily Renew diclofenac  sodium 1% gel for topical application Recommend heat therapy and gentle range-of-motion exercises Consider referral to Occupational Therapy if hand symptoms worsen significantly Chronic kidney disease, stage 2 (mild) Assessment: Patient with mild chronic kidney disease, currently with GFR of 58 mL/min (decreased from previous value of 68.48 in December 2024). BUN is mildly elevated at 27 mg/dL, but creatinine remains within normal limits at 1.18 mg/dL. Patient reports concern about possible kidney strain from high usage of topical lidocaine . No proteinuria on previous evaluations. Recent phosphorus level low normal at 2.6 mg/dL. Plan: Comprehensive metabolic panel completed today with  mild elevation in BUN and decreased GFR consistent with CKD stage 3a Order urinalysis with microscopic evaluation to assess for proteinuria Check PTH level to evaluate for secondary hyperparathyroidism given low-normal phosphorus Continue monitoring renal function every 3-4 months as requested by patient Advised patient on medication safety with respect to kidney function Continue vitamin D  supplementation with 25-OH vitamin D  level adequate at 47.95 ng/mL Small fiber neuropathy  Mixed hyperlipidemia Assessment: Patient with mixed hyperlipidemia on atorvastatin  40mg  daily. Recent lipid panel shows relatively well-controlled LDL at 57 mg/dL, but persistent low HDL at 34.6 mg/dL and elevated triglycerides at 160 mg/dL. Total cholesterol remains well-controlled at 123 mg/dL. Plan: Continue atorvastatin  40mg  daily Encourage dietary modifications to improve HDL and triglycerides, including increased omega-3 intake Continue current omega-3 supplementation Reassess lipid panel in 6 months Thromboangiitis obliterans (Buerger's disease) (HCC) Assessment: History of Buerger's disease with Raynaud's symptoms, primarily affecting fingertips. Previous workup  negative for systemic inflammatory disorders. Current management includes nifedipine . May have vascular contribution to lower extremity symptoms. Plan: Continue nifedipine  for Raynaud's symptom management Consider cilostazol  for peripheral vascular disease component of symptoms Reinforce importance of avoiding cold exposure to extremities Check vascular status of lower extremities during examination RLS (restless legs syndrome) Assessment: Patient reports restless legs symptoms contributing to sleep disturbance despite CPAP use for OSA. Plan: Continue current management with pregabalin  (which can help both neuropathy and RLS) Assess iron status with recent labs showing normalized B12 levels Discuss sleep hygiene measures Consider augmentation of RLS therapy if symptoms persist with pregabalin  increase Follow-up Plan Return visit in 3 months to assess response to therapy Complete all ordered laboratory studies before next visit Call with any worsening symptoms or adverse effects from medication changes Total face-to-face time spent with patient: 30 minutes, including extensive counseling regarding diagnosis, medication adjustments, and coordination of care     Orders Placed During this Encounter:   Orders Placed This Encounter  Procedures   CBC with Differential/Platelet   Comprehensive metabolic panel with GFR   Phosphorus   Vitamin D  (25 hydroxy)   PTH, intact (no Ca)   Uric acid   Urinalysis w microscopic + reflex cultur   B12   Methylmalonic Acid   Ceruloplasmin   Zinc   Protein Electrophoresis, (serum)   UPEP/UIFE/Light Chains/TP, 24-Hr Ur   ANCA Screen Reflex Titer   Sedimentation rate   C-reactive protein   Ambulatory referral to Podiatry    Referral Priority:   Routine    Referral Type:   Consultation    Referral Reason:   Specialty Services Required    Requested Specialty:   Podiatry    Number of Visits Requested:   1   Ambulatory referral to Pain Clinic     Referral Priority:   Routine    Referral Type:   Consultation    Referral Reason:   Specialty Services Required    Requested Specialty:   Pain Medicine    Number of Visits Requested:   1   Ambulatory referral to Dermatology    Referral Priority:   Routine    Referral Type:   Consultation    Referral Reason:   Specialty Services Required    Requested Specialty:   Dermatology    Number of Visits Requested:   1   Meds ordered this encounter  Medications   diclofenac  Sodium (VOLTAREN ) 1 % GEL    Sig: Apply 2 g topically in the morning and at bedtime. Every morning and every night.    Dispense:  1200 g    Refill:  11   pregabalin  (LYRICA ) 200 MG capsule    Sig: Take 1 capsule (200 mg total) by mouth 2 (two) times daily. Dose increased    Dispense:  60 capsule    Refill:  5               Additional Info: This encounter employed state-of-the-art, real-time, collaborative documentation. The patient actively reviewed and assisted in updating their electronic medical record on a shared screen, ensuring transparency and facilitating joint problem-solving for the problem list, overview, and plan. This approach promotes accurate, informed care. The treatment plan was discussed and reviewed in detail, including medication safety, potential side effects, and all patient questions. We confirmed understanding and comfort with the plan. Follow-up instructions were established, including contacting the office for any concerns, returning if symptoms worsen, persist, or new symptoms develop, and precautions for potential emergency department visits.   This document was synthesized by artificial intelligence ([employer]) using HIPAA-compliant recording of the clinical interaction;   We discussed the use of AI scribe software for clinical note transcription with the patient, who gave verbal consent to proceed.

## 2023-07-31 NOTE — Patient Instructions (Signed)
 AFTER VISIT SUMMARY AND INSTRUCTIONS   Today's Visit Summary: Thank you for coming in today for follow-up on your kidney function and leg pain. We discussed your worsening neuropathy symptoms and kidney monitoring. Your lab work today showed your kidney function is slightly decreased but stable, and we've made several adjustments to your treatment plan. YOUR TREATMENT PLAN   Medication Changes Lyrica  (pregabalin ): Increased to 200mg  twice daily (morning and night) Voltaren  Gel (diclofenac  sodium): Renewed prescription Lidocaine  5% ointment: Renewed with increased quantity  Tests Ordered Completed today: Blood tests for kidney function, inflammation markers, vitamin levels Urine test to check kidney health  Referrals Pain Management: For evaluation of additional pain treatment options Podiatry: For foot evaluation and treatment recommendations Dermatology: For skin assessment  Follow-up Schedule appointment in 3 months to reassess symptoms and response to treatment changes  For Your Neuropathy Pain: Take your increased dose of Lyrica  as prescribed Use lidocaine  ointment for spot treatment of painful areas Continue with gentle exercise as it seems to help your symptoms Use your percussion gun as needed for temporary relief Try to avoid prolonged periods of standing or sitting For Your Kidney Health: Continue current medication regimen Stay well-hydrated Follow up as scheduled to monitor kidney function For Your Circulation (Buerger's Disease): Continue taking nifedipine  as prescribed Keep extremities warm, especially in cold weather Report any worsening of symptoms in fingers or toes SEEK IMMEDIATE MEDICAL ATTENTION if you experience: Severe, sudden increase in pain not relieved by medication New weakness or inability to move extremities Severe swelling or color changes in your legs or feet Symptoms of allergic reaction to new medication doses     HELPFUL RESOURCES: ?? [employer]  [employer] - Educational resources about neuropathy ?? [employer] - Information about kidney health and disease ?? [employer] - Assistance program for medication costs    Please call our office if you have any questions or concerns before your next appointment.

## 2023-07-31 NOTE — Assessment & Plan Note (Signed)
 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 Recent lipid panel shows relatively well-controlled LDL at 57 mg/dL, but persistent low HDL at 34.6 mg/dL and elevated triglycerides at 160 mg/dL. Total cholesterol remains well-controlled at 123 mg/dL. Plan: Continue atorvastatin  40mg  daily Encourage dietary modifications to improve HDL and triglycerides, including increased omega-3 intake Continue current omega-3 supplementation Reassess lipid panel in 6 months

## 2023-07-31 NOTE — Assessment & Plan Note (Signed)
 Assessment: Patient reports restless legs symptoms contributing to sleep disturbance despite CPAP use for OSA. Plan: Continue current management with pregabalin  (which can help both neuropathy and RLS) Assess iron status with recent labs showing normalized B12 levels Discuss sleep hygiene measures Consider augmentation of RLS therapy if symptoms persist with pregabalin  increase

## 2023-07-31 NOTE — Assessment & Plan Note (Signed)
 Assessment: Patient with osteoarthritis affecting multiple joints including hands. Reports significant pain in hands after activities such as washing hands and jarring peanuts. Plan: Continue celecoxib  200mg  daily Renew diclofenac  sodium 1% gel for topical application Recommend heat therapy and gentle range-of-motion exercises Consider referral to Occupational Therapy if hand symptoms worsen significantly

## 2023-08-02 ENCOUNTER — Other Ambulatory Visit

## 2023-08-02 DIAGNOSIS — G609 Hereditary and idiopathic neuropathy, unspecified: Secondary | ICD-10-CM | POA: Diagnosis not present

## 2023-08-02 DIAGNOSIS — N182 Chronic kidney disease, stage 2 (mild): Secondary | ICD-10-CM | POA: Diagnosis not present

## 2023-08-02 DIAGNOSIS — G629 Polyneuropathy, unspecified: Secondary | ICD-10-CM | POA: Diagnosis not present

## 2023-08-05 LAB — URINALYSIS W MICROSCOPIC + REFLEX CULTURE
Bacteria, UA: NONE SEEN /HPF
Bilirubin Urine: NEGATIVE
Glucose, UA: NEGATIVE
Hgb urine dipstick: NEGATIVE
Hyaline Cast: NONE SEEN /LPF
Ketones, ur: NEGATIVE
Leukocyte Esterase: NEGATIVE
Nitrites, Initial: NEGATIVE
Protein, ur: NEGATIVE
RBC / HPF: NONE SEEN /HPF (ref 0–2)
Specific Gravity, Urine: 1.019 (ref 1.001–1.035)
Squamous Epithelial / HPF: NONE SEEN /HPF (ref <=5)
WBC, UA: NONE SEEN /HPF (ref 0–5)
pH: 6 (ref 5.0–8.0)

## 2023-08-05 LAB — ANCA SCREEN W REFLEX TITER: ANCA SCREEN: NEGATIVE

## 2023-08-05 LAB — PROTEIN ELECTROPHORESIS, SERUM
Albumin ELP: 4.1 g/dL (ref 3.8–4.8)
Alpha 1: 0.2 g/dL (ref 0.2–0.3)
Alpha 2: 0.7 g/dL (ref 0.5–0.9)
Beta 2: 0.3 g/dL (ref 0.2–0.5)
Beta Globulin: 0.4 g/dL (ref 0.4–0.6)
Gamma Globulin: 0.7 g/dL — ABNORMAL LOW (ref 0.8–1.7)
Total Protein: 6.4 g/dL (ref 6.1–8.1)

## 2023-08-05 LAB — CERULOPLASMIN: Ceruloplasmin: 20 mg/dL (ref 14–30)

## 2023-08-05 LAB — NO CULTURE INDICATED

## 2023-08-05 LAB — METHYLMALONIC ACID, SERUM: Methylmalonic Acid, Quant: 106 nmol/L (ref 85–423)

## 2023-08-05 LAB — ZINC: Zinc: 60 ug/dL (ref 60–130)

## 2023-08-05 LAB — PARATHYROID HORMONE, INTACT (NO CA): PTH: 49 pg/mL (ref 16–77)

## 2023-08-06 ENCOUNTER — Encounter: Payer: Self-pay | Admitting: Internal Medicine

## 2023-08-06 DIAGNOSIS — R7303 Prediabetes: Secondary | ICD-10-CM | POA: Insufficient documentation

## 2023-08-06 DIAGNOSIS — N182 Chronic kidney disease, stage 2 (mild): Secondary | ICD-10-CM | POA: Insufficient documentation

## 2023-08-08 LAB — UPEP/UIFE/LIGHT CHAINS/TP, 24-HR UR
% BETA, Urine: 0 %
ALBUMIN, U: 100 %
ALPHA 1 URINE: 0 %
ALPHA-2-GLOBULIN, U: 0 %
Free Kappa Lt Chains,Ur: 45.86 mg/L (ref 1.17–86.46)
Free Lambda Lt Chains,Ur: 8.15 mg/L (ref 0.27–15.21)
GAMMA GLOBULIN URINE: 0 %
Kappa/Lambda Ratio,U: 5.63 (ref 1.83–14.26)
Protein, Ur: 9.3 mg/dL

## 2023-08-12 ENCOUNTER — Ambulatory Visit: Admitting: Podiatry

## 2023-08-12 ENCOUNTER — Other Ambulatory Visit: Payer: Self-pay | Admitting: Internal Medicine

## 2023-08-12 DIAGNOSIS — K219 Gastro-esophageal reflux disease without esophagitis: Secondary | ICD-10-CM

## 2023-08-14 ENCOUNTER — Ambulatory Visit

## 2023-08-14 ENCOUNTER — Encounter: Payer: Self-pay | Admitting: Podiatry

## 2023-08-14 ENCOUNTER — Ambulatory Visit: Admitting: Podiatry

## 2023-08-14 ENCOUNTER — Encounter: Payer: Self-pay | Admitting: Internal Medicine

## 2023-08-14 ENCOUNTER — Ambulatory Visit (INDEPENDENT_AMBULATORY_CARE_PROVIDER_SITE_OTHER): Admitting: Internal Medicine

## 2023-08-14 ENCOUNTER — Encounter (HOSPITAL_COMMUNITY): Payer: Self-pay

## 2023-08-14 VITALS — BP 118/60 | HR 72 | Temp 99.0°F | Ht 69.5 in | Wt 175.0 lb

## 2023-08-14 VITALS — Ht 69.5 in | Wt 177.0 lb

## 2023-08-14 DIAGNOSIS — R0989 Other specified symptoms and signs involving the circulatory and respiratory systems: Secondary | ICD-10-CM | POA: Diagnosis not present

## 2023-08-14 DIAGNOSIS — I517 Cardiomegaly: Secondary | ICD-10-CM

## 2023-08-14 DIAGNOSIS — R062 Wheezing: Secondary | ICD-10-CM

## 2023-08-14 DIAGNOSIS — R051 Acute cough: Secondary | ICD-10-CM

## 2023-08-14 DIAGNOSIS — J069 Acute upper respiratory infection, unspecified: Secondary | ICD-10-CM | POA: Diagnosis not present

## 2023-08-14 DIAGNOSIS — J439 Emphysema, unspecified: Secondary | ICD-10-CM | POA: Diagnosis not present

## 2023-08-14 DIAGNOSIS — H6611 Chronic tubotympanic suppurative otitis media, right ear: Secondary | ICD-10-CM

## 2023-08-14 DIAGNOSIS — R0602 Shortness of breath: Secondary | ICD-10-CM | POA: Diagnosis not present

## 2023-08-14 DIAGNOSIS — I509 Heart failure, unspecified: Secondary | ICD-10-CM | POA: Diagnosis not present

## 2023-08-14 DIAGNOSIS — G609 Hereditary and idiopathic neuropathy, unspecified: Secondary | ICD-10-CM

## 2023-08-14 DIAGNOSIS — R918 Other nonspecific abnormal finding of lung field: Secondary | ICD-10-CM | POA: Diagnosis not present

## 2023-08-14 LAB — POC COVID19 BINAXNOW: SARS Coronavirus 2 Ag: NEGATIVE

## 2023-08-14 MED ORDER — AMOXICILLIN-POT CLAVULANATE 875-125 MG PO TABS: 1.0000 | ORAL_TABLET | Freq: Two times a day (BID) | ORAL | 0 refills | Status: DC

## 2023-08-14 NOTE — Progress Notes (Signed)
 ==============================  [facility]: [phone]   -- Medical Office Visit --  Patient: Stephen Anderson      Age: 83 y.o.       Sex:  male  Date:   08/14/2023 Today's Healthcare Provider: Bernardino KANDICE Cone, MD  ==============================   Chief Complaint: Cough (Pt states has been going on for over week. Pt has tried otc anything he tries is not helping. Coughing so much ribs is hurting.)   History of Present Illness 83 year old male who presents with persistent cough and ear infection.  He has had a persistent cough for the past two weeks, producing a 'dirty brown bloody liquid'. The cough is severe enough to cause soreness in his ribs. No chest pain, shortness of breath, or fever, although he mentions sweating once. He has been using Mucinex and a Z-Pak (azithromycin) without improvement.  He reports an infection in his right ear, with pus and blood behind the eardrum, leading to hearing loss in that ear. No pain in the ear. He has not experienced any vomiting but clarifies that he has been coughing up phlegm.  He mentions a family history of lung issues, noting that his father had fluid congestion in the lungs and a history of lung cancer, although his father did not smoke. He himself smoked in the past.  He denies any history of COPD, asthma, emphysema, or chronic bronchitis. He sleeps mostly flat and has not experienced waking up short of breath. He feels he is as fatigued as he was during his last visit and has been able to continue working and fishing.   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 Past Surgical History:   has a past surgical history that includes Gallbladder surgery; Hernia repair; Cataract extraction, bilateral; and Melanoma excision. Social History:   reports that he quit smoking about 15 years ago. His smoking use included cigarettes. He started smoking about 65 years ago. He has a 50 pack-year smoking history. He has never been exposed to tobacco smoke. He quit smokeless tobacco use about 21 years ago. He reports that he does not drink alcohol and does not use drugs. Family History:  family history includes Bladder Cancer in his brother; Heart  disease in his brother; Lung cancer in his father; Neuropathy in his daughter. Allergies:  is allergic to hydrocodone, oxycodone, oxycontin [oxycodone hcl], and simvastatin.   Medication Reconciliation: Current Outpatient Medications on File Prior to Visit  Medication Sig   Acetaminophen (TYLENOL ARTHRITIS EXT RELIEF PO) Take 1 tablet by mouth every other day.   Alpha-Lipoic Acid 300 MG TABS Take 2 tablets (600 mg total) by mouth daily at 6 (six) AM.   Ascorbic Acid (VITAMIN C PO) Take 500 mg by mouth daily.   aspirin 81 MG tablet Take 81 mg by mouth every other day.    atorvastatin  (LIPITOR) 40 MG tablet TAKE 1 TABLET BY MOUTH AT BEDTIME   caffeine 200 MG TABS tablet Take 200 mg by mouth in the morning and at bedtime. STAY AWAKE   celecoxib  (CELEBREX ) 200 MG capsule Take 1 capsule (200 mg total) by mouth daily.   Cholecalciferol  (VITAMIN D -3) 125 MCG (5000 UT) TABS Take 1 tablet by mouth daily.   cilostazol  (PLETAL ) 50 MG tablet TAKE 1 TABLET BY MOUTH 2 TIMES A DAY   Clobetasol Propionate 0.05 % shampoo    Cyanocobalamin  (B-12 PO) Take 1 tablet by mouth daily.   diclofenac  Sodium (VOLTAREN ) 1 % GEL Apply 2 g topically in the morning and at bedtime. Every morning and every night.   Ferrous Sulfate (IRON PO) Take 65 mg by mouth daily.   fluticasone  (CUTIVATE ) 0.05 % cream    fluticasone  (FLONASE ) 50 MCG/ACT nasal spray Place 1 spray into both nostrils daily. (Patient taking differently: Place 1 spray into both nostrils daily. As needed.)   ipratropium (ATROVENT ) 0.06 % nasal spray Place 1-2 sprays into both nostrils 4 (four) times daily. As needed for nasal congestion   ketoconazole  (NIZORAL ) 2 % shampoo Apply to affected area- let sit 3-5 minutes before rinsing   lidocaine  (XYLOCAINE ) 5 % ointment Apply 1 Application topically as needed.   MAGNESIUM PO Take 400 mg by mouth daily.   NIFEdipine  (PROCARDIA  XL) 60 MG 24 hr tablet Take 1 tablet (60 mg total) by mouth daily. Dose increased to  help improve blood flow and energy. (Patient taking differently: Take 60 mg by mouth daily. Dose increased to help improve blood flow and energy. Patient reported taking 30 mg.)   NIFEdipine  (PROCARDIA -XL/NIFEDICAL-XL) 30 MG 24 hr tablet TAKE 1 TABLET BY MOUTH DAILY   NON FORMULARY CPAP   omega-3 acid ethyl esters (LOVAZA ) 1 g capsule Take 2 capsules (2 g total) by mouth 2 (two) times daily.   omega-3 acid ethyl esters (LOVAZA ) 1 g capsule TAKE 2 CAPSULES BY MOUTH IN THE MORNING, AT NOON, AND AT BEDTIME   pantoprazole  (PROTONIX ) 40 MG tablet TAKE 1 TABLET BY MOUTH DAILY   pregabalin  (LYRICA ) 200 MG capsule Take 1 capsule (200 mg total) by mouth 2 (two) times daily. Dose increased   primidone  (MYSOLINE ) 50 MG tablet TAKE ONE TABLET BY MOUTH EVERY MORNING AND ONE TABLET EVERY EVENING   triamcinolone  cream (KENALOG ) 0.1 % Apply 1 Application topically 2 (two) times daily. Up to 14 days use   UNABLE TO FIND 100 mg in the morning and at bedtime. Out back oil.   neomycin -polymyxin-hydrocortisone (CORTISPORIN ) OTIC solution Place 3 drops into the left ear 3 (three) times daily for 3 days.   No current facility-administered medications on file prior to visit.   Medications Discontinued During This Encounter  Medication Reason   amoxicillin -clavulanate (AUGMENTIN ) 875-125 MG tablet Reorder     Physical Exam:  08/14/2023    2:14 PM 08/14/2023    8:22 AM 07/31/2023    8:29 AM  Vitals with BMI  Height 5' 9.5 5' 9.5   Weight 175 lbs 177 lbs 177 lbs  BMI 25.48 25.77   Systolic 118  128  Diastolic 60  74  Pulse 72  79  Vital signs reviewed.  Nursing notes reviewed. Weight trend reviewed. Physical Exam General Appearance:  No acute distress appreciable.   Well-groomed, healthy-appearing male.  Well proportioned with no abnormal fat distribution.  Good muscle tone. Pulmonary:  Normal work of breathing at rest, no respiratory distress apparent. SpO2: 98 %  Musculoskeletal: All extremities are  intact.  Neurological:  Awake, alert, oriented, and engaged.  No obvious focal neurological deficits or cognitive impairments.  Sensorium seems unclouded.   Speech is clear and coherent with logical content. Psychiatric:  Appropriate mood, pleasant and cooperative demeanor, thoughtful and engaged during the exam Physical Exam HEENT: Right ear infection with pus and blood behind the eardrum. Hearing loss in the right ear due to fluid. Left ear normal. Throat normal. CHEST: Wheezing present. Fluid at the base of both lungs. Normal work of breathing but lots of coughing with froggy throat     Results for orders placed or performed in visit on 08/14/23  CBC with Differential/Platelet  Result Value Ref Range   WBC 11.4 (H) 4.0 - 10.5 K/uL   RBC 4.23 4.22 - 5.81 Mil/uL   Hemoglobin 13.0 13.0 - 17.0 g/dL   HCT 61.6 (L) 60.9 - 47.9 %   MCV 90.5 78.0 - 100.0 fl   MCHC 33.9 30.0 - 36.0 g/dL   RDW 86.0 88.4 - 84.4 %   Platelets 228.0 150.0 - 400.0 K/uL   Neutrophils Relative % 79.4 (H) 43.0 - 77.0 %   Lymphocytes Relative 13.4 12.0 - 46.0 %   Monocytes Relative 6.2 3.0 - 12.0 %   Eosinophils Relative 0.2 0.0 - 5.0 %   Basophils Relative 0.8 0.0 - 3.0 %   Neutro Abs 9.0 (H) 1.4 - 7.7 K/uL   Lymphs Abs 1.5 0.7 - 4.0 K/uL   Monocytes Absolute 0.7 0.1 - 1.0 K/uL   Eosinophils Absolute 0.0 0.0 - 0.7 K/uL   Basophils Absolute 0.1 0.0 - 0.1 K/uL  Comp Met (CMET)  Result Value Ref Range   Sodium 137 135 - 145 mEq/L   Potassium 4.8 3.5 - 5.1 mEq/L   Chloride 105 96 - 112 mEq/L   CO2 24 19 - 32 mEq/L   Glucose, Bld 119 (H) 70 - 99 mg/dL   BUN 27 (H) 6 - 23 mg/dL   Creatinine, Ser 8.88 0.40 - 1.50 mg/dL   Total Bilirubin 0.2 0.2 - 1.2 mg/dL   Alkaline Phosphatase 74 39 - 117 U/L   AST 28 0 - 37 U/L   ALT 38 0 - 53 U/L   Total Protein 7.0 6.0 - 8.3 g/dL   Albumin 3.7 3.5 - 5.2 g/dL   GFR 37.56 >39.99 mL/min   Calcium  8.7 8.4 - 10.5 mg/dL  B Nat Peptide  Result Value Ref Range   Pro B  Natriuretic peptide (BNP) 24.0 0.0 - 100.0 pg/mL  C-reactive protein  Result Value Ref Range   CRP 4.0 0.5 - 20.0 mg/dL  Sedimentation rate  Result Value Ref Range   Sed Rate 44 (H) 0 - 20 mm/hr  B12 and Folate Panel  Result Value Ref Range   Vitamin B-12 634 211 - 911 pg/mL  Folate 13.1 >5.9 ng/mL  POC COVID-19  Result Value Ref Range   SARS Coronavirus 2 Ag Negative Negative   Office Visit on 08/14/2023  Component Date Value   WBC 08/14/2023 11.4 (H)    RBC 08/14/2023 4.23    Hemoglobin 08/14/2023 13.0    HCT 08/14/2023 38.3 (L)    MCV 08/14/2023 90.5    MCHC 08/14/2023 33.9    RDW 08/14/2023 13.9    Platelets 08/14/2023 228.0    Neutrophils Relative % 08/14/2023 79.4 (H)    Lymphocytes Relative 08/14/2023 13.4    Monocytes Relative 08/14/2023 6.2    Eosinophils Relative 08/14/2023 0.2    Basophils Relative 08/14/2023 0.8    Neutro Abs 08/14/2023 9.0 (H)    Lymphs Abs 08/14/2023 1.5    Monocytes Absolute 08/14/2023 0.7    Eosinophils Absolute 08/14/2023 0.0    Basophils Absolute 08/14/2023 0.1    Sodium 08/14/2023 137    Potassium 08/14/2023 4.8    Chloride 08/14/2023 105    CO2 08/14/2023 24    Glucose, Bld 08/14/2023 119 (H)    BUN 08/14/2023 27 (H)    Creatinine, Ser 08/14/2023 1.11    Total Bilirubin 08/14/2023 0.2    Alkaline Phosphatase 08/14/2023 74    AST 08/14/2023 28    ALT 08/14/2023 38    Total Protein 08/14/2023 7.0    Albumin 08/14/2023 3.7    GFR 08/14/2023 62.43    Calcium  08/14/2023 8.7    Pro B Natriuretic peptid* 08/14/2023 24.0    CRP 08/14/2023 4.0    Sed Rate 08/14/2023 44 (H)    Vitamin B-12 08/14/2023 634    Folate 08/14/2023 13.1    SARS Coronavirus 2 Ag 08/14/2023 Negative   Office Visit on 07/31/2023  Component Date Value   WBC 07/31/2023 7.0    RBC 07/31/2023 4.75    Hemoglobin 07/31/2023 14.5    HCT 07/31/2023 43.5    MCV 07/31/2023 91.7    MCHC 07/31/2023 33.3    RDW 07/31/2023 14.0    Platelets 07/31/2023 167.0     Neutrophils Relative % 07/31/2023 64.7    Lymphocytes Relative 07/31/2023 24.9    Monocytes Relative 07/31/2023 7.5    Eosinophils Relative 07/31/2023 2.3    Basophils Relative 07/31/2023 0.6    Neutro Abs 07/31/2023 4.5    Lymphs Abs 07/31/2023 1.7    Monocytes Absolute 07/31/2023 0.5    Eosinophils Absolute 07/31/2023 0.2    Basophils Absolute 07/31/2023 0.0    Sodium 07/31/2023 138    Potassium 07/31/2023 4.2    Chloride 07/31/2023 104    CO2 07/31/2023 28    Glucose, Bld 07/31/2023 101 (H)    BUN 07/31/2023 27 (H)    Creatinine, Ser 07/31/2023 1.18    Total Bilirubin 07/31/2023 0.6    Alkaline Phosphatase 07/31/2023 59    AST 07/31/2023 16    ALT 07/31/2023 14    Total Protein 07/31/2023 6.7    Albumin 07/31/2023 4.4    GFR 07/31/2023 58.03 (L)    Calcium  07/31/2023 8.8    Phosphorus 07/31/2023 2.6    VITD 07/31/2023 47.95    PTH 07/31/2023 49    Uric Acid, Serum 07/31/2023 5.8    Color, Urine 07/31/2023 YELLOW    APPearance 07/31/2023 CLEAR    Specific Gravity, Urine 07/31/2023 1.019    pH 07/31/2023 6.0    Glucose, UA 07/31/2023 NEGATIVE    Bilirubin Urine 07/31/2023 NEGATIVE    Ketones, ur 07/31/2023 NEGATIVE    Hgb urine  dipstick 07/31/2023 NEGATIVE    Protein, ur 07/31/2023 NEGATIVE    Nitrites, Initial 07/31/2023 NEGATIVE    Leukocyte Esterase 07/31/2023 NEGATIVE    WBC, UA 07/31/2023 NONE SEEN    RBC / HPF 07/31/2023 NONE SEEN    Squamous Epithelial / HPF 07/31/2023 NONE SEEN    Bacteria, UA 07/31/2023 NONE SEEN    Hyaline Cast 07/31/2023 NONE SEEN    Note 07/31/2023     Vitamin B-12 07/31/2023 483    Methylmalonic Acid, Quant 07/31/2023 106    Ceruloplasmin 07/31/2023 20    Zinc  07/31/2023 60    Total Protein 07/31/2023 6.4    Albumin ELP 07/31/2023 4.1    Alpha 1 07/31/2023 0.2    Alpha 2 07/31/2023 0.7    Beta Globulin 07/31/2023 0.4    Beta 2 07/31/2023 0.3    Gamma Globulin 07/31/2023 0.7 (L)    Abnormal Protein Band1 07/31/2023     SPE  Interp. 07/31/2023     Protein, Ur 08/02/2023 9.3    Protein, 24H Urine 08/02/2023 Comment    ALBUMIN, U 08/02/2023 100.0    ALPHA 1 URINE 08/02/2023 0.0    ALPHA-2-GLOBULIN, U 08/02/2023 0.0    % BETA, Urine 08/02/2023 0.0    GAMMA GLOBULIN URINE 08/02/2023 0.0    M-SPIKE, % 08/02/2023 Not Observed    M-Spike, mg/24 hr 08/02/2023 Comment    Immunofixation, Urine 08/02/2023 Comment    NOTE: 08/02/2023 Comment    Free Kappa Lt Chains,Ur 08/02/2023 45.86    Free Lambda Lt Chains,Ur 08/02/2023 8.15    Kappa/Lambda Ratio,U 08/02/2023 5.63    ANCA SCREEN 07/31/2023 Negative    Sed Rate 07/31/2023 4    CRP 07/31/2023 <1.0    Reflexve Urine Culture 07/31/2023    Office Visit on 03/27/2023  Component Date Value   Vitamin B-12 03/27/2023 1,492 (H)    Folate 03/27/2023 16.0    Hgb A1c MFr Bld 03/27/2023 5.9    Ferritin 03/27/2023 53.8    Sodium 03/27/2023 142    Potassium 03/27/2023 4.3    Chloride 03/27/2023 105    CO2 03/27/2023 30    Glucose, Bld 03/27/2023 109 (H)    BUN 03/27/2023 21    Creatinine, Ser 03/27/2023 1.03    Total Bilirubin 03/27/2023 0.5    Alkaline Phosphatase 03/27/2023 68    AST 03/27/2023 17    ALT 03/27/2023 14    Total Protein 03/27/2023 6.8    Albumin 03/27/2023 4.5    GFR 03/27/2023 68.48    Calcium  03/27/2023 8.9    WBC 03/27/2023 6.3    RBC 03/27/2023 4.74    Hemoglobin 03/27/2023 14.6    HCT 03/27/2023 43.8    MCV 03/27/2023 92.3    MCHC 03/27/2023 33.3    RDW 03/27/2023 14.0    Platelets 03/27/2023 185.0    Neutrophils Relative % 03/27/2023 60.8    Lymphocytes Relative 03/27/2023 27.2    Monocytes Relative 03/27/2023 9.0    Eosinophils Relative 03/27/2023 2.3    Basophils Relative 03/27/2023 0.7    Neutro Abs 03/27/2023 3.8    Lymphs Abs 03/27/2023 1.7    Monocytes Absolute 03/27/2023 0.6    Eosinophils Absolute 03/27/2023 0.1    Basophils Absolute 03/27/2023 0.0    Cholesterol 03/27/2023 123    Triglycerides 03/27/2023 81.0    HDL  03/27/2023 41.00    VLDL 03/27/2023 16.2    LDL Cholesterol 03/27/2023 65    Total CHOL/HDL Ratio 03/27/2023 3    NonHDL 03/27/2023 81.51  VITD 03/27/2023 34.36    Anti Nuclear Antibody (A* 03/27/2023 Negative    Rheumatoid fact SerPl-aC* 03/27/2023 <10    T3 Uptake 03/27/2023 28    T4, Total 03/27/2023 7.5    Free Thyroxine Index 03/27/2023 2.1    TSH 03/27/2023 1.05    Arsenic, 24H Ur 04/04/2023 <10    Lead, 24 hr urine 04/04/2023 <10    Mercury, 24H Ur 04/04/2023 <4   Office Visit on 09/24/2022  Component Date Value   Rheumatoid fact SerPl-aC* 09/24/2022 <10    Sed Rate 09/24/2022 2    Scleroderma (Scl-70) (EN* 09/24/2022 <1.0 NEG    Ribonucleic Protein(ENA)* 09/24/2022 <1.0 NEG    ENA SM Ab Ser-aCnc 09/24/2022 <1.0 NEG    SSA (Ro) (ENA) Antibody,* 09/24/2022 <1.0 NEG    ds DNA Ab 09/24/2022 1    C3 Complement 09/24/2022 124    C4 Complement 09/24/2022 21   Office Visit on 09/05/2022  Component Date Value   WBC 09/05/2022 6.9    RBC 09/05/2022 4.71    Hemoglobin 09/05/2022 14.1    HCT 09/05/2022 42.4    MCV 09/05/2022 90.2    MCHC 09/05/2022 33.2    RDW 09/05/2022 14.9    Platelets 09/05/2022 158.0    Neutrophils Relative % 09/05/2022 62.2    Lymphocytes Relative 09/05/2022 25.1    Monocytes Relative 09/05/2022 9.9    Eosinophils Relative 09/05/2022 2.2    Basophils Relative 09/05/2022 0.6    Neutro Abs 09/05/2022 4.3    Lymphs Abs 09/05/2022 1.7    Monocytes Absolute 09/05/2022 0.7    Eosinophils Absolute 09/05/2022 0.2    Basophils Absolute 09/05/2022 0.0    Ferritin 09/05/2022 29.4    Cholesterol 09/05/2022 123    Triglycerides 09/05/2022 160.0 (H)    HDL 09/05/2022 34.60 (L)    VLDL 09/05/2022 32.0    LDL Cholesterol 09/05/2022 57    Total CHOL/HDL Ratio 09/05/2022 4    NonHDL 09/05/2022 88.83    Sodium 09/05/2022 142    Potassium 09/05/2022 4.4    Chloride 09/05/2022 107    CO2 09/05/2022 28    Glucose, Bld 09/05/2022 101 (H)    BUN 09/05/2022 19     Creatinine, Ser 09/05/2022 1.17    Total Bilirubin 09/05/2022 0.4    Alkaline Phosphatase 09/05/2022 73    AST 09/05/2022 18    ALT 09/05/2022 15    Total Protein 09/05/2022 6.5    Albumin 09/05/2022 4.0    GFR 09/05/2022 59.00 (L)    Calcium  09/05/2022 8.6    VITD 09/05/2022 46.57    Microalb, Ur 09/05/2022 1.8    Creatinine,U 09/05/2022 97.2    Microalb Creat Ratio 09/05/2022 1.9    Magnesium 09/05/2022 1.9    Phosphorus 09/05/2022 2.1 (L)    Uric Acid, Serum 09/05/2022 5.7   No image results found. No results found.      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  The eardrum is not at risk of rupture, but the infection is severe. The current antibiotic, Z-Pak, is ineffective. Discontinue Z-Pak and prescribe Augmentin  (amoxicillin -clavulanate) as an alternative treatment. Acute heart failure, unspecified heart failure type (HCC) No history heart failure - Fluid is present in the bottom of both lungs. The etiology is unclear, with concern for possible infection or other causes of fluid accumulation. There is no fever or dyspnea. A family history includes his father's death due to fluid congestion and lung cancer. His smoking history may contribute to lung issues. Order a chest x-ray to evaluate lung condition and consider further imaging if x-ray findings are concerning. Upper respiratory tract infection, unspecified type He presents with symptoms of cough, postnasal drip, and a sensation of a  'frog in the throat', likely affecting the upper respiratory tract and possibly extending to the lungs. There is no sore throat or fever. Significant coughing leads to rib soreness, but there is no vomiting or hemoptysis. Prescribe Augmentin  (amoxicillin -clavulanate) and order a chest x-ray to assess for lower respiratory involvement.  Attempted to get RSV and other viral testing but N/A in our outpatient clinic.        Orders Placed During this Encounter:   Orders Placed This Encounter  Procedures   DG Chest 2 View    Standing Status:   Future    Number of Occurrences:   1    Expiration Date:   08/13/2024    Reason for Exam (SYMPTOM  OR DIAGNOSIS REQUIRED):   chest congestion    Preferred imaging location?:   [facility]   CBC with Differential/Platelet   Comp Met (CMET)   B Nat Peptide   C-reactive protein   Sedimentation rate   B12 and Folate Panel   POC COVID-19    Previously tested for COVID-19:   No    Resident in a congregate (group) care setting:   No    Employed in healthcare setting:   No   Meds ordered this encounter  Medications   amoxicillin -clavulanate (AUGMENTIN ) 875-125 MG tablet    Sig: Take 1 tablet by mouth 2 (two) times daily.    Dispense:  20 tablet    Refill:  0      This document was synthesized by artificial intelligence ([employer]) using HIPAA-compliant recording of the clinical interaction;   We discussed the use of AI scribe software for clinical note transcription with the patient, who gave verbal consent to proceed. additional Info: This encounter employed state-of-the-art, real-time, collaborative documentation. The patient actively reviewed and assisted in updating their electronic medical record on a shared screen, ensuring transparency and facilitating joint problem-solving for the problem list, overview, and plan. This approach promotes accurate, informed care. The treatment plan was discussed and reviewed in detail, including medication  safety, potential side effects, and all patient questions. We confirmed understanding and comfort with the plan. Follow-up instructions were established, including contacting the office for any concerns, returning if symptoms worsen, persist, or new symptoms develop, and precautions for potential emergency department visits.

## 2023-08-14 NOTE — Assessment & Plan Note (Signed)
 There is a significant infection in the right ear with pus and blood behind the eardrum, causing hearing loss. The eardrum is not at risk of rupture, but the infection is severe. The current antibiotic, Z-Pak, is ineffective. Discontinue Z-Pak and prescribe Augmentin  (amoxicillin -clavulanate) as an alternative treatment.

## 2023-08-14 NOTE — Progress Notes (Signed)
 Chief Complaint  Patient presents with   Peripheral Neuropathy    RM8: NEW PATIENT Idiopathic peripheral neuropathy,Small fiber neuropathy    HPI: 83 y.o. male presenting today for evaluation of idiopathic peripheral polyneuropathy ongoing for about 15 years now.  Patient is not diabetic.  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)   Chest discomfort 04/05/2022   Associated with fatigue worsening 02/2022   Essential tremor    GERD (gastroesophageal reflux disease) 02/24/2018   High risk medication use 04/05/2022   Lyrica  for neuropathy     History of melanoma 03/26/2018   S/p excision on back   Hypercholesteremia    Iron deficiency anemia 06/07/2022   Lab Results  Component  Value  Date/Time     HGB  14.8  05/08/2022 09:12 AM     HGB  13.9  03/06/2022 08:44 AM     HGB  11.9 (L)  10/26/2021 08:37 AM     HGB  13.1  06/27/2021 08:58 AM     HGB  14.8  05/24/2020 08:17 AM         Mixed hyperlipidemia 02/24/2018   Peripheral neuropathy    RUQ abdominal pain 05/08/2022   We did ultrasound abdomen 04/26/22 to evaluate pain he has where it feels like right upper quadrant of abdomen pain flared by leaning forward feels like liver tip rolling over but that was negative   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   well diff-right hand- txpbx   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 ([id number])   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   in situ-right parotid area   Unintentional weight loss 04/05/2022   Wt Readings from Last 20 Encounters: 04/05/22 170 lb (77.1 kg) 03/06/22 178 lb 12.8 oz (81.1 kg) 10/26/21 168 lb (76.2 kg) 09/21/21 176 lb 2 oz (79.9 kg) 07/05/21 182 lb 6.4 oz (82.7 kg) 06/27/21 179 lb  (81.2 kg) 06/01/21 178 lb 12.8 oz (81.1 kg) 01/10/21 174 lb 6.4 oz (79.1 kg) 12/19/20 171 lb (77.6 kg) 08/29/20 178 lb 6.4 oz (80.9 kg) 06/15/20 184 lb 12.8 oz (83.8 kg) 06/02/20 183 lb    Past Surgical History:  Procedure Laterality Date   CATARACT EXTRACTION, BILATERAL     GALLBLADDER SURGERY     HERNIA REPAIR     bilateral   MELANOMA EXCISION      Allergies  Allergen Reactions   Hydrocodone Other (See Comments)   Oxycodone Other (See Comments)   Oxycontin [Oxycodone Hcl] Hives   Simvastatin Other (See Comments)     Physical Exam: General: The patient is alert and oriented x3 in no acute distress.  Dermatology: Skin is warm, dry and supple bilateral lower extremities.   Vascular: Palpable pedal pulses bilaterally. Capillary refill within normal limits.  No appreciable edema.  No erythema.  Neurological: Light touch and protective threshold diminished  Musculoskeletal Exam: No pedal deformities noted   Assessment/Plan of Care: 1.  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  Chart reviewed -Continue management with Dr. Boston Byers, PCP.  Currently taking Lyrica  twice daily.  He states that it helps.  Continue -Also continue topical neuropathy cream that he purchases OTC.  He says that this  helps as well -I explained to the patient I do not have anything additional that I can offer to make the neuropathy permanently go away.  He has had it for about 15 years now and he seems to be managing fairly well -Return to clinic as needed       Dot Gazella, DPM [facility]  Dr. Dot Gazella, DPM    [address] [place], [place] [address]                Office [phone]  Fax [phone]

## 2023-08-15 ENCOUNTER — Encounter: Payer: Self-pay | Admitting: Internal Medicine

## 2023-08-15 LAB — COMPREHENSIVE METABOLIC PANEL WITH GFR
ALT: 38 U/L (ref 0–53)
AST: 28 U/L (ref 0–37)
Albumin: 3.7 g/dL (ref 3.5–5.2)
Alkaline Phosphatase: 74 U/L (ref 39–117)
BUN: 27 mg/dL — ABNORMAL HIGH (ref 6–23)
CO2: 24 meq/L (ref 19–32)
Calcium: 8.7 mg/dL (ref 8.4–10.5)
Chloride: 105 meq/L (ref 96–112)
Creatinine, Ser: 1.11 mg/dL (ref 0.40–1.50)
GFR: 62.43 mL/min (ref >60.00)
Glucose, Bld: 119 mg/dL — ABNORMAL HIGH (ref 70–99)
Potassium: 4.8 meq/L (ref 3.5–5.1)
Sodium: 137 meq/L (ref 135–145)
Total Bilirubin: 0.2 mg/dL (ref 0.2–1.2)
Total Protein: 7 g/dL (ref 6.0–8.3)

## 2023-08-15 LAB — CBC WITH DIFFERENTIAL/PLATELET
Basophils Absolute: 0.1 10*3/uL (ref 0.0–0.1)
Basophils Relative: 0.8 % (ref 0.0–3.0)
Eosinophils Absolute: 0 10*3/uL (ref 0.0–0.7)
Eosinophils Relative: 0.2 % (ref 0.0–5.0)
HCT: 38.3 % — ABNORMAL LOW (ref 39.0–52.0)
Hemoglobin: 13 g/dL (ref 13.0–17.0)
Lymphocytes Relative: 13.4 % (ref 12.0–46.0)
Lymphs Abs: 1.5 10*3/uL (ref 0.7–4.0)
MCHC: 33.9 g/dL (ref 30.0–36.0)
MCV: 90.5 fl (ref 78.0–100.0)
Monocytes Absolute: 0.7 10*3/uL (ref 0.1–1.0)
Monocytes Relative: 6.2 % (ref 3.0–12.0)
Neutro Abs: 9 10*3/uL — ABNORMAL HIGH (ref 1.4–7.7)
Neutrophils Relative %: 79.4 % — ABNORMAL HIGH (ref 43.0–77.0)
Platelets: 228 10*3/uL (ref 150.0–400.0)
RBC: 4.23 Mil/uL (ref 4.22–5.81)
RDW: 13.9 % (ref 11.5–15.5)
WBC: 11.4 10*3/uL — ABNORMAL HIGH (ref 4.0–10.5)

## 2023-08-15 LAB — C-REACTIVE PROTEIN: CRP: 4 mg/dL (ref 0.5–20.0)

## 2023-08-15 LAB — B12 AND FOLATE PANEL
Folate: 13.1 ng/mL (ref >5.9)
Vitamin B-12: 634 pg/mL (ref 211–911)

## 2023-08-15 LAB — SEDIMENTATION RATE: Sed Rate: 44 mm/h — ABNORMAL HIGH (ref 0–20)

## 2023-08-15 LAB — BRAIN NATRIURETIC PEPTIDE: Pro B Natriuretic peptide (BNP): 24 pg/mL (ref 0.0–100.0)

## 2023-08-15 NOTE — Patient Instructions (Signed)
 It was a pleasure seeing you today! Your health and satisfaction are our top priorities.  Scherrie Curt, MD  Your Providers PCP: Anthon Kins, MD,  [phone]) Referring Provider: Anthon Kins, MD,  [phone]) Care Team Provider: Shirline Dover, DO,  [phone]) Care Team Provider: Faustina Hood, MD,  [phone]) Care Team Provider: Cristine Done Care Team Provider: Genevie Kerns, MD,  [phone]) Care Team Provider: Lanita Pitman, MD,  [phone]) Care Team Provider: Dema Filler, MD,  [phone]) Care Team Provider: Dema Filler, MD,  [phone]) Care Team Provider: Devon Fogo, MD Care Team Provider: Dorthey Gave, PA-C Care Team Provider: Sheryle Donning, MD,  [phone]) Care Team Provider: Baldo Bonds, MD,  [phone]) Care Team Provider: Matt Song, MD,  [phone])     NEXT STEPS: [x]  Early Intervention: Schedule sooner appointment, call our on-call services, or go to emergency room if there is any significant Increase in pain or discomfort New or worsening symptoms Sudden or severe changes in your health [x]  Flexible Follow-Up: We recommend a No follow-ups on file. for optimal routine care. This allows for progress monitoring and treatment adjustments. [x]  Preventive Care: Schedule your annual preventive care visit! It's typically covered by insurance and helps identify potential health issues early. [x]  Lab & X-ray Appointments: Incomplete tests scheduled today, or call to schedule. X-rays: [facility] (M-F, 8:30am-noon or 1pm-5pm). [x]  Medical Information Release: Sign a release form at front desk to obtain relevant medical information we don't have.  MAKING THE MOST OF OUR FOCUSED 20 MINUTE APPOINTMENTS: [x]   Clearly state your top concerns at the beginning of the visit to focus our discussion [x]   If you anticipate you will need more time, please inform the front  desk during scheduling - we can book multiple appointments in the same week. [x]   If you have transportation problems- use our convenient video appointments or ask about transportation support. [x]   We can get down to business faster if you use MyChart to update information before the visit and submit non-urgent questions before your visit. Thank you for taking the time to provide details through MyChart.  Let our nurse know and she can import this information into your encounter documents.  Arrival and Wait Times: [x]   Arriving on time ensures that everyone receives prompt attention. [x]   Early morning (8a) and afternoon (1p) appointments tend to have shortest wait times. [x]   Unfortunately, we cannot delay appointments for late arrivals or hold slots during phone calls.  Getting Answers and Following Up [x]   Simple Questions & Concerns: For quick questions or basic follow-up after your visit, reach us  at [phone] or MyChart messaging. [x]   Complex Concerns: If your concern is more complex, scheduling an appointment might be best. Discuss this with the staff to find the most suitable option. [x]   Lab & Imaging Results: We'll contact you directly if results are abnormal or you don't use MyChart. Most normal results will be on MyChart within 2-3 business days, with a review message from Dr. Boston Byers. Haven't heard back in 2 weeks? Need results sooner? Contact us  at [phone]. [x]   Referrals: Our referral coordinator will manage specialist referrals. The specialist's office should contact you within 2 weeks to schedule an appointment. Call us  if you haven't heard from them after 2 weeks.  Staying Connected [x]   MyChart: Activate your MyChart for the fastest way to access results and message us . See the last page of this paperwork for instructions on how to  activate.  Bring to Your Next Appointment [x]   Medications: Please bring all your medication bottles to your next appointment to  ensure we have an accurate record of your prescriptions. [x]   Health Diaries: If you're monitoring any health conditions at home, keeping a diary of your readings can be very helpful for discussions at your next appointment.  Billing [x]   X-ray & Lab Orders: These are billed by separate companies. Contact the invoicing company directly for questions or concerns. [x]   Visit Charges: Discuss any billing inquiries with our administrative services team.  Your Satisfaction Matters [x]   Share Your Experience: We strive for your satisfaction! If you have any complaints, or preferably compliments, please let Dr. Boston Byers know directly or contact our Practice Administrators, Olinda Bertrand or [employer], by asking at the front desk.   Reviewing Your Records [x]   Review this early draft of your clinical encounter notes below and the final encounter summary tomorrow on MyChart after its been completed.  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 The eardrum is not at risk of rupture, but the infection is severe. The current antibiotic, Z-Pak, is ineffective. Discontinue Z-Pak and prescribe Augmentin  (amoxicillin -clavulanate) as an alternative treatment.  Orders: -     Amoxicillin -Pot Clavulanate; Take 1 tablet by mouth 2 (two) times daily.  Dispense: 20 tablet; Refill: 0  Acute heart failure, unspecified heart failure type (HCC) -     CBC with Differential/Platelet -     Comprehensive metabolic panel with GFR -     Brain natriuretic peptide -     C-reactive protein -     Sedimentation rate -     Cytology - Non PAP; -     B12 and Folate Panel  Wheezing -     CBC with Differential/Platelet -     Comprehensive metabolic panel with GFR -     Brain natriuretic peptide -     C-reactive protein -      Sedimentation rate -     Cytology - Non PAP; -     B12 and Folate Panel -     DG Chest 2 View; Future  Acute cough -     CBC with Differential/Platelet -     Comprehensive metabolic panel with GFR -     Brain natriuretic peptide -     C-reactive protein -     Sedimentation rate -     Cytology - Non PAP; -     B12 and Folate Panel -     DG Chest 2 View; Future  Upper respiratory tract infection, unspecified type -     POC COVID-19 BinaxNow -     DG Chest 2 View; Future

## 2023-08-16 NOTE — Telephone Encounter (Signed)
 Spoke with pt about labs and xray results states having ear pain but he can tolerated at this time no drainage. He will take tyn for pain for ear. Will follow up next week.

## 2023-08-18 ENCOUNTER — Encounter: Payer: Self-pay | Admitting: Internal Medicine

## 2023-08-18 NOTE — Progress Notes (Signed)
 XR shows pneumonia on left lower lung... not surprising based on how bad the coughing was.  Need follow up in office if not greatly improved, and if greatly improved we should do repeat XR in 3-4 weeks to confirm its gone.

## 2023-08-20 ENCOUNTER — Ambulatory Visit: Payer: Self-pay

## 2023-08-20 ENCOUNTER — Other Ambulatory Visit: Payer: Self-pay

## 2023-08-20 DIAGNOSIS — H9211 Otorrhea, right ear: Secondary | ICD-10-CM

## 2023-08-20 NOTE — Addendum Note (Signed)
 Addended by: Deysi Soldo on: 08/20/2023 11:54 AM   Modules accepted: Orders

## 2023-08-21 ENCOUNTER — Ambulatory Visit (INDEPENDENT_AMBULATORY_CARE_PROVIDER_SITE_OTHER): Admitting: Internal Medicine

## 2023-08-21 ENCOUNTER — Encounter: Payer: Self-pay | Admitting: Internal Medicine

## 2023-08-21 VITALS — BP 112/70 | HR 68 | Temp 98.0°F | Ht 69.5 in | Wt 175.8 lb

## 2023-08-21 DIAGNOSIS — H66011 Acute suppurative otitis media with spontaneous rupture of ear drum, right ear: Secondary | ICD-10-CM | POA: Diagnosis not present

## 2023-08-21 MED ORDER — OFLOXACIN 0.3 % OP SOLN: 1.0000 [drp] | Freq: Two times a day (BID) | OPHTHALMIC | 0 refills | Status: DC

## 2023-08-21 MED ORDER — OFLOXACIN 0.3 % OT SOLN: 5.0000 [drp] | Freq: Two times a day (BID) | OTIC | 0 refills | Status: DC

## 2023-08-21 NOTE — Progress Notes (Unsigned)
 ==============================  [facility]: [phone]   -- Medical Office Visit --  Patient: Stephen Anderson      Age: 83 y.o.       Sex:  male  Date:   08/21/2023 Today's Healthcare Provider: Anthon Kins, MD  ==============================   Chief Complaint: right ear pain (Pt states he is having drainage painful over the weekend and tried to get appt and could not get one past two days. Referral has been placed for ENT) and Pain (Pt states he does not need pain referral he is fine with what he is taking now for pain)   Discussed the use of AI scribe software for clinical note transcription with the patient, who gave verbal consent to proceed.  History of Present Illness 83 year old male who presents with ear pain and hearing loss following a recent ear infection.  He experienced a significant ear infection that began with pain over the weekend, leading to a sensation of fullness and numbness around the ear. The ear began to drain, which alleviated the pain, but it is currently 'stopped up' with hearing loss in the right ear. The ear canal is full of pus, which has changed from a brown liquid to white pus over the past week. He can hear slightly when the other ear is blocked, indicating some residual hearing in the affected ear.  He is currently taking Augmentin , with four to five days of medication remaining, and uses Tylenol for discomfort in the ear. He also uses a Q-tip to clean the outer ear canal after showering.  He has a recent history of pneumonia, confirmed by an x-ray. His cough has improved significantly, and he has not experienced any coughing in the past two days. No fever or feeling sick, and his lungs feel good.  He has been resting since Monday with minimal activity and denies feeling short of breath. He recently went out for pizza and relies on Social Security for income.    Physical Exam:    08/21/2023    9:19 AM  08/14/2023    2:14 PM 08/14/2023    8:22 AM  Vitals with BMI  Height 5' 9.5" 5' 9.5" 5' 9.5"  Weight 175 lbs 13 oz 175 lbs 177 lbs  BMI 25.6 25.48 25.77  Systolic 112 118   Diastolic 70 60   Pulse 68 72   Vital signs reviewed.  Nursing notes reviewed. Weight trend reviewed. Physical Exam General Appearance:  No acute distress appreciable.   Well-groomed, healthy-appearing male.  Well proportioned with no abnormal fat distribution.  Good muscle tone. Pulmonary:  Normal work of breathing at rest, no respiratory distress apparent. SpO2: 98 %  Musculoskeletal: All extremities are intact.  Neurological:  Awake, alert, oriented, and engaged.  No obvious focal neurological deficits or cognitive impairments.  Sensorium seems unclouded.   Speech is clear and coherent with logical content. Psychiatric:  Appropriate mood, pleasant and cooperative demeanor, thoughtful and engaged during the exam Physical Exam HEENT: Right ear canal filled with purulent discharge, tympanic membrane not visible. Carefully collected with culture swab (had to recollect due to using wrong culture sample first collection, no pain during collection.        05/17/2023    8:48 AM 05/01/2023    8:35 AM 03/27/2023    8:50 AM 01/16/2023    8:45 AM  PHQ 2/9 Scores  PHQ - 2 Score 0 0 0 1  PHQ- 9 Score  2  1 4   Results Procedure: Ear Canal Cleaning Description: Purulent discharge was cleaned from the ear canal using a cotton swab. The ear canal was filled with white purulent material, and some of it was removed. A culture was taken from the discharge for bacterial analysis.  RADIOLOGY Chest x-ray: Pneumonia (08/14/2023)    Assessment & Plan Acute suppurative otitis media of right ear with spontaneous rupture of tympanic membrane, recurrence not specified Assessment: Persistent right ear otorrhea with hearing loss and fullness, following recent treatment with Augmentin  for acute otitis media and pneumonia. He has a severe ear  infection with a suspected ruptured eardrum, characterized by significant pus accumulation in the ear canal. Hearing loss in the right ear has improved slightly, and pain has decreased since the ear began draining. Symptoms suggest tympanic membrane rupture with ongoing middle ear infection. No systemic signs of infection currently; pneumonia symptoms resolved. Concern for resistant organism (e.g., MRSA or Pseudomonas) given ongoing drainage despite antibiotics. ENT appointment scheduled for tomorrow (stat referral made). Plan: Topical antibiotic therapy: Started ciprofloxacin/dexamethasone otic drops (Ciprodex) [or ofloxacin 0.3% otic, if used] in right ear, BID  7-10 days. Chosen due to safety in suspected TM perforation and coverage of Pseudomonas and Staph aureus. Culture obtained: Collected sample of purulent drainage from right ear canal for bacterial culture and sensitivity (C&S) to guide targeted therapy if needed. No systemic antibiotics added: Augmentin  course recently completed. Patient is afebrile, well-appearing, with no mastoid tenderness or systemic signs. Will defer systemic MRSA/Pseudomonas coverage unless ENT recommends based on clinical course or culture. Pain/symptom management: Advised ibuprofen or acetaminophen as needed. Instructed to avoid water exposure in right ear (no swimming; precautions while showering). ENT follow-up: Patient instructed to keep ENT appointment tomorrow. Advised to monitor for worsening symptoms including fever, increased swelling, mastoid tenderness, or dizziness.  Pneumonia was diagnosed via x-ray. Symptoms have improved significantly, with no cough or fever reported. He feels well and denies shortness of breath. A repeat chest x-ray is planned in 2-3 weeks to ensure resolution.        Orders Placed During this Encounter:   Orders Placed This Encounter  Procedures   Anaerobic and Aerobic Culture    Pus filling auditory canal, hearing  loss right    Release to patient:   Immediate    Release to patient:   Immediate [1]   Meds ordered this encounter  Medications   ofloxacin (OCUFLOX) 0.3 % ophthalmic solution    Sig: Place 1 drop into the right ear 2 (two) times daily.    Dispense:  5 mL    Refill:  0    Fill the cheaper option for the patient only   ofloxacin (FLOXIN) 0.3 % OTIC solution    Sig: Place 5 drops into the right ear 2 (two) times daily.    Dispense:  5 mL    Refill:  0    Fill the cheaper option for the patient only   ED Discharge Orders          Ordered    ofloxacin (OCUFLOX) 0.3 % ophthalmic solution  2 times daily       Note to Pharmacy: Fill the cheaper option for the patient only   08/21/23 0958    ofloxacin (FLOXIN) 0.3 % OTIC solution  2 times daily       Note to Pharmacy: Fill the cheaper option for the patient only   08/21/23 0958    Anaerobic and Aerobic Culture  Comments: Pus filling auditory canal, hearing loss right    08/21/23 0959              This document was synthesized by artificial intelligence ([employer]bridge) using HIPAA-compliant recording of the clinical interaction;   We discussed the use of AI scribe software for clinical note transcription with the patient, who gave verbal consent to proceed. additional Info: This encounter employed state-of-the-art, real-time, collaborative documentation. The patient actively reviewed and assisted in updating their electronic medical record on a shared screen, ensuring transparency and facilitating joint problem-solving for the problem list, overview, and plan. This approach promotes accurate, informed care. The treatment plan was discussed and reviewed in detail, including medication safety, potential side effects, and all patient questions. We confirmed understanding and comfort with the plan. Follow-up instructions were established, including contacting the office for any concerns, returning if symptoms worsen, persist, or new symptoms  develop, and precautions for potential emergency department visits.

## 2023-08-21 NOTE — Patient Instructions (Signed)
??   After Visit Summary Visit Summary: You were seen today for drainage, hearing loss, and a feeling of fullness in your right ear. This likely happened because of a middle ear infection (called acute otitis media) that caused your eardrum to rupture, allowing fluid and pus to drain out. You were recently treated with antibiotics for pneumonia and an ear infection. While your pneumonia has improved, your ear still has ongoing drainage, which means the infection might not be fully cleared or could be caused by a more resistant germ.  ? What We Did Today: We started antibiotic ear drops to treat the infection locally. We collected a sample of the ear drainage to send to the lab, so we know exactly which germ is causing the problem. You were referred to see an ear specialist (ENT), and you already have an appointment scheduled for tomorrow.  ?? Medications: Ciprofloxacin/dexamethasone (Ciprodex) or Ofloxacin ear drops: Use 2-3 drops in the right ear, twice a day, for 7-10 days. Shake the bottle well, lie on your side with the affected ear up, place the drops, and stay in that position for a few minutes to let the drops go in fully. Pain relief (as needed): You may take ibuprofen (Advil) or acetaminophen (Tylenol) as needed for pain or discomfort.  ?? What to Avoid: Do not let water get into your right ear. Avoid swimming, and be careful while showering -- you can use a cotton ball with petroleum jelly or a shower cap to protect your ear. Do not use Q-tips or anything else inside your ear.  ?? Follow-Up: You are scheduled to see ENT (Ear, Nose, and Throat doctor) tomorrow. They will examine your ear more closely, possibly clean it out more thoroughly, and decide if any other treatment is needed.  ?? Call or Return If: You develop a fever or feel generally unwell You notice increased swelling, redness, or pain around the ear or face The drainage increases or turns bloody You have dizziness,  worsening hearing, or balance problems  Excellent clinical reasoning -- the concern about MRSA or resistant pathogens is valid, especially given: Failure of Augmentin , which covers common otitis media pathogens but not MRSA or Pseudomonas. Ongoing purulent drainage, suggesting persistent infection. Possible tympanic membrane rupture, which increases risk of external pathogens entering.

## 2023-08-22 ENCOUNTER — Encounter (INDEPENDENT_AMBULATORY_CARE_PROVIDER_SITE_OTHER): Payer: Self-pay | Admitting: Otolaryngology

## 2023-08-22 ENCOUNTER — Ambulatory Visit (INDEPENDENT_AMBULATORY_CARE_PROVIDER_SITE_OTHER): Admitting: Otolaryngology

## 2023-08-22 VITALS — BP 122/68 | HR 55 | Ht 70.5 in | Wt 175.0 lb

## 2023-08-22 DIAGNOSIS — H9201 Otalgia, right ear: Secondary | ICD-10-CM

## 2023-08-22 DIAGNOSIS — H6091 Unspecified otitis externa, right ear: Secondary | ICD-10-CM | POA: Diagnosis not present

## 2023-08-22 DIAGNOSIS — H60331 Swimmer's ear, right ear: Secondary | ICD-10-CM

## 2023-08-24 ENCOUNTER — Ambulatory Visit: Payer: Self-pay | Admitting: Internal Medicine

## 2023-08-24 DIAGNOSIS — H60331 Swimmer's ear, right ear: Secondary | ICD-10-CM | POA: Insufficient documentation

## 2023-08-24 DIAGNOSIS — H9201 Otalgia, right ear: Secondary | ICD-10-CM | POA: Insufficient documentation

## 2023-08-24 NOTE — Progress Notes (Signed)
 Culture never grew anything.

## 2023-08-24 NOTE — Progress Notes (Signed)
 CC: Right ear pain, right ear muffled hearing  HPI:  Stephen Anderson is a 83 y.o. male who presents today complaining of clogging sensation and pain in the right ear for the past week.  His right ear hearing is also muffled.  He has no previous otologic surgery.  He denies any recent known ear infection.  Currently he is not on any otologic medication.  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)   Chest discomfort 04/05/2022   Associated with fatigue worsening 02/2022   Essential tremor    GERD (gastroesophageal reflux disease) 02/24/2018   High risk medication use 04/05/2022   Lyrica  for neuropathy     History of melanoma 03/26/2018   S/p excision on back   Hypercholesteremia    Iron deficiency anemia 06/07/2022   Lab Results  Component  Value  Date/Time     HGB  14.8  05/08/2022 09:12 AM     HGB  13.9  03/06/2022 08:44 AM     HGB  11.9 (L)  10/26/2021 08:37 AM     HGB  13.1  06/27/2021 08:58 AM     HGB  14.8  05/24/2020 08:17 AM         Mixed hyperlipidemia 02/24/2018   Peripheral neuropathy    RUQ abdominal pain 05/08/2022   We did ultrasound abdomen 04/26/22 to evaluate pain he has where it feels like right upper quadrant of abdomen pain flared by leaning forward feels like liver tip rolling over but that was negative   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   well diff-right hand- txpbx   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 ([id number])   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   in situ-right parotid area   Unintentional weight loss 04/05/2022   Wt Readings from Last 20 Encounters: 04/05/22 170 lb (77.1 kg) 03/06/22 178 lb 12.8 oz (81.1 kg) 10/26/21 168 lb (76.2 kg) 09/21/21 176 lb 2 oz (79.9 kg) 07/05/21 182 lb 6.4 oz (82.7 kg) 06/27/21 179 lb (81.2 kg) 06/01/21 178 lb 12.8 oz (81.1  kg) 01/10/21 174 lb 6.4 oz (79.1 kg) 12/19/20 171 lb (77.6 kg) 08/29/20 178 lb 6.4 oz (80.9 kg) 06/15/20 184 lb 12.8 oz (83.8 kg) 06/02/20 183 lb    Past Surgical History:  Procedure Laterality Date   CATARACT EXTRACTION, BILATERAL     GALLBLADDER SURGERY     HERNIA REPAIR     bilateral   MELANOMA EXCISION      Family History  Problem Relation Age of Onset   Lung cancer Father    Bladder Cancer Brother    Heart disease Brother    Neuropathy Daughter     Social History:  reports that he quit smoking about 15 years ago. His smoking use included cigarettes. He started smoking about 65 years ago. He has a 50 pack-year smoking history. He has never been exposed to tobacco smoke. He quit smokeless tobacco use about 21 years ago. He reports that he does not drink alcohol and does not use drugs.  Allergies:  Allergies  Allergen Reactions   Hydrocodone Other (See Comments)   Oxycodone Other (See Comments)   Oxycontin [Oxycodone Hcl] Hives   Simvastatin Other (See Comments)    Prior to Admission medications   Medication Sig Start Date End Date Taking? Authorizing Provider  Acetaminophen (  TYLENOL ARTHRITIS EXT RELIEF PO) Take 1 tablet by mouth every other day.   Yes [provider]  Alpha-Lipoic Acid 300 MG TABS Take 2 tablets (600 mg total) by mouth daily at 6 (six) AM. 03/27/23  Yes Anthon Kins, MD  amoxicillin -clavulanate (AUGMENTIN ) 875-125 MG tablet Take 1 tablet by mouth 2 (two) times daily. 08/14/23  Yes Anthon Kins, MD  Ascorbic Acid (VITAMIN C PO) Take 500 mg by mouth daily.   Yes [provider]  aspirin 81 MG tablet Take 81 mg by mouth every other day.    Yes [provider]  atorvastatin  (LIPITOR) 40 MG tablet TAKE 1 TABLET BY MOUTH AT BEDTIME 06/22/23  Yes Anthon Kins, MD  caffeine 200 MG TABS tablet Take 200 mg by mouth in the morning and at bedtime. "STAY AWAKE"   Yes [provider]  celecoxib  (CELEBREX ) 200 MG capsule  Take 1 capsule (200 mg total) by mouth daily. 01/16/23  Yes Anthon Kins, MD  Cholecalciferol (VITAMIN D -3) 125 MCG (5000 UT) TABS Take 1 tablet by mouth daily.   Yes [provider]  cilostazol  (PLETAL ) 50 MG tablet TAKE 1 TABLET BY MOUTH 2 TIMES A DAY 06/25/23  Yes Anthon Kins, MD  Clobetasol Propionate 0.05 % shampoo    Yes [provider]  Cyanocobalamin  (B-12 PO) Take 1 tablet by mouth daily.   Yes [provider]  diclofenac  Sodium (VOLTAREN ) 1 % GEL Apply 2 g topically in the morning and at bedtime. Every morning and every night. 07/31/23  Yes Anthon Kins, MD  Ferrous Sulfate (IRON PO) Take 65 mg by mouth daily.   Yes [provider]  fluticasone  (CUTIVATE ) 0.05 % cream    Yes [provider]  fluticasone  (FLONASE ) 50 MCG/ACT nasal spray Place 1 spray into both nostrils daily. Patient taking differently: Place 1 spray into both nostrils daily. As needed. 06/27/21  Yes Ulyess Gammons, NP  ipratropium (ATROVENT ) 0.06 % nasal spray Place 1-2 sprays into both nostrils 4 (four) times daily. As needed for nasal congestion 07/05/21  Yes Benjiman Bras, MD  ketoconazole  (NIZORAL ) 2 % shampoo Apply to affected area- let sit 3-5 minutes before rinsing 12/06/22  Yes Anthon Kins, MD  lidocaine  (XYLOCAINE ) 5 % ointment Apply 1 Application topically as needed. 03/27/23  Yes Anthon Kins, MD  MAGNESIUM PO Take 400 mg by mouth daily.   Yes [provider]  NIFEdipine  (PROCARDIA  XL) 60 MG 24 hr tablet Take 1 tablet (60 mg total) by mouth daily. Dose increased to help improve blood flow and energy. Patient taking differently: Take 60 mg by mouth daily. Dose increased to help improve blood flow and energy. Patient reported taking 30 mg. 12/06/22  Yes Anthon Kins, MD  NIFEdipine  (PROCARDIA -XL/NIFEDICAL-XL) 30 MG 24 hr tablet TAKE 1 TABLET BY MOUTH DAILY 07/30/23  Yes Anthon Kins, MD  NON FORMULARY CPAP   Yes [provider]  ofloxacin  (FLOXIN ) 0.3 % OTIC solution Place 5 drops into the right ear 2 (two) times daily. 08/21/23  Yes Anthon Kins, MD  ofloxacin  (OCUFLOX ) 0.3 % ophthalmic solution Place 1 drop into the right ear 2 (two) times daily. 08/21/23  Yes Anthon Kins, MD  omega-3 acid ethyl esters (LOVAZA ) 1 g capsule Take 2 capsules (2 g total) by mouth 2 (two) times daily. 01/16/23  Yes Anthon Kins, MD  omega-3 acid ethyl esters (LOVAZA ) 1 g capsule TAKE 2  CAPSULES BY MOUTH IN THE MORNING, AT NOON, AND AT BEDTIME 07/21/23  Yes Anthon Kins, MD  pantoprazole  (PROTONIX ) 40 MG tablet TAKE 1 TABLET BY MOUTH DAILY 08/12/23  Yes Anthon Kins, MD  pregabalin  (LYRICA ) 200 MG capsule Take 1 capsule (200 mg total) by mouth 2 (two) times daily. Dose increased 07/31/23  Yes Anthon Kins, MD  primidone  (MYSOLINE ) 50 MG tablet TAKE ONE TABLET BY MOUTH EVERY MORNING AND ONE TABLET EVERY EVENING 05/02/23  Yes Anthon Kins, MD  triamcinolone  cream (KENALOG ) 0.1 % Apply 1 Application topically 2 (two) times daily. Up to 14 days use 05/17/23  Yes Anthon Kins, MD  UNABLE TO FIND 100 mg in the morning and at bedtime. Out back oil.   Yes [provider]  neomycin -polymyxin-hydrocortisone (CORTISPORIN) OTIC solution Place 3 drops into the left ear 3 (three) times daily for 3 days. 05/17/23 05/20/23  Anthon Kins, MD    Blood pressure 122/68, pulse (!) 55, height 5' 10.5" (1.791 m), weight 175 lb (79.4 kg), SpO2 92%. Exam: General: Communicates without difficulty, well nourished, no acute distress. Head: Normocephalic, no evidence injury, no tenderness, facial buttresses intact without stepoff. Face/sinus: No tenderness to palpation and percussion. Facial movement is normal and symmetric. Eyes: PERRL, EOMI. No scleral icterus, conjunctivae clear. Neuro: CN II exam reveals vision grossly intact.  No nystagmus at any point of gaze. Ears: Auricles well formed without lesions.  Purulent drainage and fungal  debris are noted within the right ear canal.  Under operative microscope, the right ear canal is debrided with a suction catheter.  The left ear canal and tympanic membrane are normal.  Nose: External evaluation reveals normal support and skin without lesions.  Dorsum is intact.  Anterior rhinoscopy reveals congested mucosa over anterior aspect of inferior turbinates and intact septum.  No purulence noted. Oral:  Oral cavity and oropharynx are intact, symmetric, without erythema or edema.  Mucosa is moist without lesions. Neck: Full range of motion without pain.  There is no significant lymphadenopathy.  No masses palpable.  Thyroid  bed within normal limits to palpation.  Parotid glands and submandibular glands equal bilaterally without mass.  Trachea is midline. Neuro:  CN 2-12 grossly intact.   Assessment: 1.  Acute right fungal otitis externa. 2.  The left ear canal and tympanic membrane are normal.  Plan: 1.  Otomicroscopy with debridement of the right ear canal. 2.  The physical exam findings are reviewed with the patient. 3.  CSF powder daily for 2 weeks. 4.  Dry ear precautions on the right side. 5.  The patient will return for reevaluation in 2 weeks.  Wyeth Hoffer W Kamela Blansett 08/24/2023, 2:45 PM

## 2023-08-27 LAB — ANAEROBIC AND AEROBIC CULTURE
AER RESULT:: NO GROWTH
MICRO NUMBER:: 16454735
MICRO NUMBER:: 16454736
SPECIMEN QUALITY:: ADEQUATE
SPECIMEN QUALITY:: ADEQUATE

## 2023-09-04 ENCOUNTER — Ambulatory Visit (INDEPENDENT_AMBULATORY_CARE_PROVIDER_SITE_OTHER): Admitting: Internal Medicine

## 2023-09-04 ENCOUNTER — Encounter: Payer: Self-pay | Admitting: Internal Medicine

## 2023-09-04 VITALS — BP 138/70 | HR 70 | Temp 98.0°F | Ht 70.5 in | Wt 179.4 lb

## 2023-09-04 DIAGNOSIS — J029 Acute pharyngitis, unspecified: Secondary | ICD-10-CM

## 2023-09-04 DIAGNOSIS — J984 Other disorders of lung: Secondary | ICD-10-CM

## 2023-09-04 DIAGNOSIS — H6611 Chronic tubotympanic suppurative otitis media, right ear: Secondary | ICD-10-CM

## 2023-09-04 DIAGNOSIS — J668 Airway disease due to other specific organic dusts: Secondary | ICD-10-CM

## 2023-09-04 MED ORDER — PREDNISONE 20 MG PO TABS: ORAL_TABLET | ORAL | 0 refills | Status: DC

## 2023-09-04 MED ORDER — AMOXICILLIN-POT CLAVULANATE 875-125 MG PO TABS: 1.0000 | ORAL_TABLET | Freq: Two times a day (BID) | ORAL | 0 refills | Status: DC

## 2023-09-04 NOTE — Progress Notes (Signed)
 ==============================  [facility]: [phone]   -- Medical Office Visit --  Patient: Stephen Anderson      Age: 83 y.o.       Sex:  male  Date:   09/04/2023 Today's Healthcare Provider: Anthon Kins, MD  ==============================   Chief Complaint: Sore Throat (Pt has had sore throat nasal congestion since Friday states he was outside cement dust inhaled ) and Nasal Congestion Also has ruptured eardrum infected on right being managed by ENT.  Discussed the use of AI scribe software for clinical note transcription with the patient, who gave verbal consent to proceed.  History of Present Illness  83 year old male who presents with sore throat and sinus congestion after exposure to concrete dust.  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 Isodent lozenges have provided some relief for his sore throat.  He experiences sinus congestion, which he attributes to concrete dust clogging his sinuses. He attempted to clear his sinuses by inhaling water in the shower and applying Vaseline to his nose to soothe sores. He has not used nasal rinses but has experienced soreness in his nose.  He has a history of ear issues, with pus present in his right ear. He has been using a powder treatment for his ear, which was prepared at a specific location. He plans to follow up with his ear specialist soon.  He uses a CPAP machine and has been physically active, including working in his garden and exercising on a treadmill, where he reached a heart rate of 143 bpm. His throat symptoms worsen when he coughs, as drainage from his sinuses irritates his throat.  No significant breathing issues through his mouth, but he experiences drainage from his head into his throat. No pus in his throat  but confirms swelling. His lungs feel clear.  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, not believe to be due to lupus or systemic sclerosis; Diverticular disease; Right inguinal hernia; Aortic atherosclerosis (HCC); Bronchiectasis (HCC); Recurrent kidney stones; Calcification of prostate; DDD (degenerative disc disease), lumbar; LVH (left ventricular hypertrophy); Grade I diastolic dysfunction; Weight loss; Gastritis; Hiatal hernia; Lumbar back pain; Arthritis of both hands; Trigger finger, right ring finger; Chronic kidney disease, stage 2 (mild); Prediabetes; 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 Past Surgical History:   has a past surgical history that includes Gallbladder surgery; Hernia  repair; Cataract extraction, bilateral; and Melanoma excision. Social History:  reports that he quit smoking about 15 years ago. His smoking use included cigarettes. He started smoking about 65 years ago. He has a 50 pack-year smoking history. He has never been exposed to tobacco smoke. He quit smokeless tobacco use about 21 years ago. He reports that he does not drink alcohol and does not use drugs. Family History:  family history includes Bladder Cancer in his brother; Heart disease in his brother; Lung cancer in his father; Neuropathy in his daughter. Allergies:  is allergic to hydrocodone, oxycodone, oxycontin [oxycodone hcl], and simvastatin.   Medication Reconciliation: Current Outpatient Medications on File Prior to Visit  Medication Sig   Acetaminophen (TYLENOL ARTHRITIS EXT RELIEF PO) Take 1 tablet by mouth every other day.   Alpha-Lipoic Acid 300 MG TABS Take 2 tablets (600 mg total) by mouth daily at 6 (six) AM.   Ascorbic Acid (VITAMIN C PO) Take 500 mg by mouth daily.   aspirin 81 MG tablet Take 81 mg by mouth every other day.    atorvastatin  (LIPITOR) 40 MG tablet TAKE 1 TABLET BY MOUTH AT BEDTIME   caffeine 200 MG TABS tablet Take 200 mg by mouth in the morning and at bedtime. "STAY AWAKE"   celecoxib  (CELEBREX ) 200 MG capsule Take 1 capsule (200 mg total) by mouth daily.   Cholecalciferol (VITAMIN D -3) 125 MCG (5000 UT) TABS Take 1 tablet by mouth daily.   cilostazol  (PLETAL ) 50 MG tablet TAKE 1 TABLET BY MOUTH 2 TIMES A DAY   Clobetasol Propionate 0.05 % shampoo    Cyanocobalamin  (B-12 PO) Take 1 tablet by mouth daily.   diclofenac  Sodium (VOLTAREN ) 1 % GEL Apply 2 g topically in the morning and at bedtime. Every morning and every night.   Ferrous Sulfate (IRON PO) Take 65 mg by mouth daily.   fluticasone  (CUTIVATE ) 0.05 % cream    fluticasone  (FLONASE ) 50 MCG/ACT nasal spray Place 1 spray into both nostrils daily. (Patient taking differently: Place 1 spray into both nostrils  daily. As needed.)   ipratropium (ATROVENT ) 0.06 % nasal spray Place 1-2 sprays into both nostrils 4 (four) times daily. As needed for nasal congestion   ketoconazole  (NIZORAL ) 2 % shampoo Apply to affected area- let sit 3-5 minutes before rinsing   lidocaine  (XYLOCAINE ) 5 % ointment Apply 1 Application topically as needed.   MAGNESIUM PO Take 400 mg by mouth daily.   NIFEdipine  (PROCARDIA  XL) 60 MG 24 hr tablet Take 1 tablet (60 mg total) by mouth daily. Dose increased to help improve blood flow and energy. (Patient taking differently: Take 60 mg by mouth daily. Dose increased to help improve blood flow and energy. Patient reported taking 30 mg.)   NIFEdipine  (PROCARDIA -XL/NIFEDICAL-XL) 30 MG 24 hr tablet TAKE 1 TABLET BY MOUTH DAILY   NON FORMULARY CPAP   ofloxacin  (FLOXIN ) 0.3 % OTIC solution Place 5 drops into the right ear 2 (two) times daily.   ofloxacin  (OCUFLOX ) 0.3 % ophthalmic solution Place 1 drop into the right ear 2 (two) times daily.   omega-3 acid ethyl esters (LOVAZA ) 1 g capsule Take 2 capsules (2 g total) by mouth 2 (two) times daily.   omega-3 acid ethyl esters (LOVAZA ) 1 g capsule TAKE 2 CAPSULES BY MOUTH IN THE MORNING, AT NOON, AND AT BEDTIME   pantoprazole  (PROTONIX ) 40 MG tablet TAKE 1 TABLET BY MOUTH DAILY   pregabalin  (LYRICA ) 200 MG capsule Take 1 capsule (200 mg total) by mouth 2 (two) times daily. Dose increased   primidone  (  MYSOLINE ) 50 MG tablet TAKE ONE TABLET BY MOUTH EVERY MORNING AND ONE TABLET EVERY EVENING   triamcinolone  cream (KENALOG ) 0.1 % Apply 1 Application topically 2 (two) times daily. Up to 14 days use   UNABLE TO FIND 100 mg in the morning and at bedtime. Out back oil.   neomycin -polymyxin-hydrocortisone (CORTISPORIN) OTIC solution Place 3 drops into the left ear 3 (three) times daily for 3 days.   No current facility-administered medications on file prior to visit.   Medications Discontinued During This Encounter  Medication Reason    amoxicillin -clavulanate (AUGMENTIN ) 875-125 MG tablet Reorder     Physical Exam:    09/04/2023   10:03 AM 08/22/2023    3:28 PM 08/21/2023    9:19 AM  Vitals with BMI  Height 5' 10.5" 5' 10.5" 5' 9.5"  Weight 179 lbs 6 oz 175 lbs 175 lbs 13 oz  BMI 25.37 24.75 25.6  Systolic 138 122 213  Diastolic 70 68 70  Pulse 70 55 68  Vital signs reviewed.  Nursing notes reviewed. Weight trend reviewed. Physical Exam General Appearance:  No acute distress appreciable.   Well-groomed, healthy-appearing male.  Well proportioned with no abnormal fat distribution.  Good muscle tone. Pulmonary:  Normal work of breathing at rest, no respiratory distress apparent. SpO2: 98 % clear to auscultation bilaterally  Cardiology: s1 and s2 heart sounds have regular rate and rhythm  Musculoskeletal: All extremities are intact.  Neurological:  Awake, alert, oriented, and engaged.  No obvious focal neurological deficits or cognitive impairments.  Sensorium seems unclouded.   Speech is clear and coherent with logical content. Psychiatric:  Appropriate mood, pleasant and cooperative demeanor, thoughtful and engaged during the exam Physical Exam HEENT: oropharyngeal upper soft palate and tonsillar pillars mildly swollen. No cervical lymphadenopathy - Pus in right ear. CHEST: Lungs clear to auscultation. Weak raspy voice and nasal congestion severe requiring mouth breathing with wheezing but lungs clear when breathing through mouth      No results found for any visits on 09/04/23. Office Visit on 08/21/2023  Component Date Value   MICRO NUMBER: 08/21/2023 [id number]    SPECIMEN QUALITY: 08/21/2023 Adequate    Source: 08/21/2023 NOT GIVEN    STATUS: 08/21/2023 FINAL    GRAM STAIN: 08/21/2023 No epithelial cells seen No white blood cells seen No organisms seen    ANA RESULT: 08/21/2023 No anaerobes isolated.    MICRO NUMBER: 08/21/2023 [id number]    SPECIMEN QUALITY: 08/21/2023 Adequate    SOURCE: 08/21/2023 NOT  GIVEN    STATUS: 08/21/2023 FINAL    AER RESULT: 08/21/2023 No Growth    COMMENT: 08/21/2023 No source was provided. The specimen was tested and reported based upon the test code ordered. If this is incorrect, please contact client services.   Office Visit on 08/14/2023  Component Date Value   WBC 08/14/2023 11.4 (H)    RBC 08/14/2023 4.23    Hemoglobin 08/14/2023 13.0    HCT 08/14/2023 38.3 (L)    MCV 08/14/2023 90.5    MCHC 08/14/2023 33.9    RDW 08/14/2023 13.9    Platelets 08/14/2023 228.0    Neutrophils Relative % 08/14/2023 79.4 (H)    Lymphocytes Relative 08/14/2023 13.4    Monocytes Relative 08/14/2023 6.2    Eosinophils Relative 08/14/2023 0.2    Basophils Relative 08/14/2023 0.8    Neutro Abs 08/14/2023 9.0 (H)    Lymphs Abs 08/14/2023 1.5    Monocytes Absolute 08/14/2023 0.7    Eosinophils Absolute 08/14/2023 0.0  Basophils Absolute 08/14/2023 0.1    Sodium 08/14/2023 137    Potassium 08/14/2023 4.8    Chloride 08/14/2023 105    CO2 08/14/2023 24    Glucose, Bld 08/14/2023 119 (H)    BUN 08/14/2023 27 (H)    Creatinine, Ser 08/14/2023 1.11    Total Bilirubin 08/14/2023 0.2    Alkaline Phosphatase 08/14/2023 74    AST 08/14/2023 28    ALT 08/14/2023 38    Total Protein 08/14/2023 7.0    Albumin 08/14/2023 3.7    GFR 08/14/2023 62.43    Calcium  08/14/2023 8.7    Pro B Natriuretic peptid* 08/14/2023 24.0    CRP 08/14/2023 4.0    Sed Rate 08/14/2023 44 (H)    Vitamin B-12 08/14/2023 634    Folate 08/14/2023 13.1    SARS Coronavirus 2 Ag 08/14/2023 Negative   Office Visit on 07/31/2023  Component Date Value   WBC 07/31/2023 7.0    RBC 07/31/2023 4.75    Hemoglobin 07/31/2023 14.5    HCT 07/31/2023 43.5    MCV 07/31/2023 91.7    MCHC 07/31/2023 33.3    RDW 07/31/2023 14.0    Platelets 07/31/2023 167.0    Neutrophils Relative % 07/31/2023 64.7    Lymphocytes Relative 07/31/2023 24.9    Monocytes Relative 07/31/2023 7.5    Eosinophils Relative 07/31/2023  2.3    Basophils Relative 07/31/2023 0.6    Neutro Abs 07/31/2023 4.5    Lymphs Abs 07/31/2023 1.7    Monocytes Absolute 07/31/2023 0.5    Eosinophils Absolute 07/31/2023 0.2    Basophils Absolute 07/31/2023 0.0    Sodium 07/31/2023 138    Potassium 07/31/2023 4.2    Chloride 07/31/2023 104    CO2 07/31/2023 28    Glucose, Bld 07/31/2023 101 (H)    BUN 07/31/2023 27 (H)    Creatinine, Ser 07/31/2023 1.18    Total Bilirubin 07/31/2023 0.6    Alkaline Phosphatase 07/31/2023 59    AST 07/31/2023 16    ALT 07/31/2023 14    Total Protein 07/31/2023 6.7    Albumin 07/31/2023 4.4    GFR 07/31/2023 58.03 (L)    Calcium  07/31/2023 8.8    Phosphorus 07/31/2023 2.6    VITD 07/31/2023 47.95    PTH 07/31/2023 49    Uric Acid, Serum 07/31/2023 5.8    Color, Urine 07/31/2023 YELLOW    APPearance 07/31/2023 CLEAR    Specific Gravity, Urine 07/31/2023 1.019    pH 07/31/2023 6.0    Glucose, UA 07/31/2023 NEGATIVE    Bilirubin Urine 07/31/2023 NEGATIVE    Ketones, ur 07/31/2023 NEGATIVE    Hgb urine dipstick 07/31/2023 NEGATIVE    Protein, ur 07/31/2023 NEGATIVE    Nitrites, Initial 07/31/2023 NEGATIVE    Leukocyte Esterase 07/31/2023 NEGATIVE    WBC, UA 07/31/2023 NONE SEEN    RBC / HPF 07/31/2023 NONE SEEN    Squamous Epithelial / HPF 07/31/2023 NONE SEEN    Bacteria, UA 07/31/2023 NONE SEEN    Hyaline Cast 07/31/2023 NONE SEEN    Note 07/31/2023     Vitamin B-12 07/31/2023 483    Methylmalonic Acid, Quant 07/31/2023 106    Ceruloplasmin 07/31/2023 20    Zinc  07/31/2023 60    Total Protein 07/31/2023 6.4    Albumin ELP 07/31/2023 4.1    Alpha 1 07/31/2023 0.2    Alpha 2 07/31/2023 0.7    Beta Globulin 07/31/2023 0.4    Beta 2 07/31/2023 0.3    Gamma Globulin 07/31/2023  0.7 (L)    Abnormal Protein Band1 07/31/2023     SPE Interp. 07/31/2023     Protein, Ur 08/02/2023 9.3    Protein, 24H Urine 08/02/2023 Comment    ALBUMIN, U 08/02/2023 100.0    ALPHA 1 URINE 08/02/2023 0.0     ALPHA-2-GLOBULIN, U 08/02/2023 0.0    % BETA, Urine 08/02/2023 0.0    GAMMA GLOBULIN URINE 08/02/2023 0.0    M-SPIKE, % 08/02/2023 Not Observed    M-Spike, mg/24 hr 08/02/2023 Comment    Immunofixation, Urine 08/02/2023 Comment    NOTE: 08/02/2023 Comment    Free Kappa Lt Chains,Ur 08/02/2023 45.86    Free Lambda Lt Chains,Ur 08/02/2023 8.15    Kappa/Lambda Ratio,U 08/02/2023 5.63    ANCA SCREEN 07/31/2023 Negative    Sed Rate 07/31/2023 4    CRP 07/31/2023 <1.0    Reflexve Urine Culture 07/31/2023    Office Visit on 03/27/2023  Component Date Value   Vitamin B-12 03/27/2023 1,492 (H)    Folate 03/27/2023 16.0    Hgb A1c MFr Bld 03/27/2023 5.9    Ferritin 03/27/2023 53.8    Sodium 03/27/2023 142    Potassium 03/27/2023 4.3    Chloride 03/27/2023 105    CO2 03/27/2023 30    Glucose, Bld 03/27/2023 109 (H)    BUN 03/27/2023 21    Creatinine, Ser 03/27/2023 1.03    Total Bilirubin 03/27/2023 0.5    Alkaline Phosphatase 03/27/2023 68    AST 03/27/2023 17    ALT 03/27/2023 14    Total Protein 03/27/2023 6.8    Albumin 03/27/2023 4.5    GFR 03/27/2023 68.48    Calcium  03/27/2023 8.9    WBC 03/27/2023 6.3    RBC 03/27/2023 4.74    Hemoglobin 03/27/2023 14.6    HCT 03/27/2023 43.8    MCV 03/27/2023 92.3    MCHC 03/27/2023 33.3    RDW 03/27/2023 14.0    Platelets 03/27/2023 185.0    Neutrophils Relative % 03/27/2023 60.8    Lymphocytes Relative 03/27/2023 27.2    Monocytes Relative 03/27/2023 9.0    Eosinophils Relative 03/27/2023 2.3    Basophils Relative 03/27/2023 0.7    Neutro Abs 03/27/2023 3.8    Lymphs Abs 03/27/2023 1.7    Monocytes Absolute 03/27/2023 0.6    Eosinophils Absolute 03/27/2023 0.1    Basophils Absolute 03/27/2023 0.0    Cholesterol 03/27/2023 123    Triglycerides 03/27/2023 81.0    HDL 03/27/2023 41.00    VLDL 03/27/2023 16.2    LDL Cholesterol 03/27/2023 65    Total CHOL/HDL Ratio 03/27/2023 3    NonHDL 03/27/2023 81.51    VITD 03/27/2023  34.36    Anti Nuclear Antibody (A* 03/27/2023 Negative    Rheumatoid fact SerPl-aC* 03/27/2023 <10    T3 Uptake 03/27/2023 28    T4, Total 03/27/2023 7.5    Free Thyroxine Index 03/27/2023 2.1    TSH 03/27/2023 1.05    Arsenic, 24H Ur 04/04/2023 <10    Lead, 24 hr urine 04/04/2023 <10    Mercury, 24H Ur 04/04/2023 <4   Office Visit on 09/24/2022  Component Date Value   Rheumatoid fact SerPl-aC* 09/24/2022 <10    Sed Rate 09/24/2022 2    Scleroderma (Scl-70) (EN* 09/24/2022 <1.0 NEG    Ribonucleic Protein(ENA)* 09/24/2022 <1.0 NEG    ENA SM Ab Ser-aCnc 09/24/2022 <1.0 NEG    SSA (Ro) (ENA) Antibody,* 09/24/2022 <1.0 NEG    ds DNA Ab 09/24/2022 1  C3 Complement 09/24/2022 124    C4 Complement 09/24/2022 21   Office Visit on 09/05/2022  Component Date Value   WBC 09/05/2022 6.9    RBC 09/05/2022 4.71    Hemoglobin 09/05/2022 14.1    HCT 09/05/2022 42.4    MCV 09/05/2022 90.2    MCHC 09/05/2022 33.2    RDW 09/05/2022 14.9    Platelets 09/05/2022 158.0    Neutrophils Relative % 09/05/2022 62.2    Lymphocytes Relative 09/05/2022 25.1    Monocytes Relative 09/05/2022 9.9    Eosinophils Relative 09/05/2022 2.2    Basophils Relative 09/05/2022 0.6    Neutro Abs 09/05/2022 4.3    Lymphs Abs 09/05/2022 1.7    Monocytes Absolute 09/05/2022 0.7    Eosinophils Absolute 09/05/2022 0.2    Basophils Absolute 09/05/2022 0.0    Ferritin 09/05/2022 29.4    Cholesterol 09/05/2022 123    Triglycerides 09/05/2022 160.0 (H)    HDL 09/05/2022 34.60 (L)    VLDL 09/05/2022 32.0    LDL Cholesterol 09/05/2022 57    Total CHOL/HDL Ratio 09/05/2022 4    NonHDL 09/05/2022 88.83    Sodium 09/05/2022 142    Potassium 09/05/2022 4.4    Chloride 09/05/2022 107    CO2 09/05/2022 28    Glucose, Bld 09/05/2022 101 (H)    BUN 09/05/2022 19    Creatinine, Ser 09/05/2022 1.17    Total Bilirubin 09/05/2022 0.4    Alkaline Phosphatase 09/05/2022 73    AST 09/05/2022 18    ALT 09/05/2022 15    Total  Protein 09/05/2022 6.5    Albumin 09/05/2022 4.0    GFR 09/05/2022 59.00 (L)    Calcium  09/05/2022 8.6    VITD 09/05/2022 46.57    Microalb, Ur 09/05/2022 1.8    Creatinine,U 09/05/2022 97.2    Microalb Creat Ratio 09/05/2022 1.9    Magnesium 09/05/2022 1.9    Phosphorus 09/05/2022 2.1 (L)    Uric Acid, Serum 09/05/2022 5.7   No image results found. DG Chest 2 View Result Date: 08/16/2023 CLINICAL DATA:  Chest congestion.  Short of breath. EXAM: CHEST - 2 VIEW COMPARISON:  01/14/2019.  CT, 05/02/2022. FINDINGS: Cardiac silhouette is normal in size. No mediastinal or hilar masses. No evidence of adenopathy. Lungs are hyperexpanded. There is opacity in the retrocardiac region of the left lower lobe. Minor linear scarring is noted lateral to this, which is stable. Remainder of the lungs is clear. Relative lucency in the upper lobes suggests emphysema. No pleural effusion or pneumothorax. Skeletal structures are intact. IMPRESSION: 1. Left lower lobe opacity consistent with pneumonia or atelectasis. 2. No other evidence of acute cardiopulmonary disease. 3. Emphysema. Electronically Signed   By: Amanda Jungling M.D.   On: 08/16/2023 10:39        05/17/2023    8:48 AM 05/01/2023    8:35 AM 03/27/2023    8:50 AM 01/16/2023    8:45 AM  PHQ 2/9 Scores  PHQ - 2 Score 0 0 0 1  PHQ- 9 Score  2 1 4    Results RADIOLOGY Chest x-ray: No abnormalities detected   Assessment & Plan Sore throat Sore throat with swelling, likely secondary to ear infection drainage and sinus congestion. Inflammation possibly due to concrete dust inhalation. Strep throat is not suspected. Prescribe prednisone for swelling reduction and use throat lozenges for symptomatic relief. Perform steam treatments and saline rinses. 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 There is a visible 30 percent ovioid perforation in the right tympanic membrane.   The infection may contribute to a sore throat via drainage. Prescribe antibiotics for the bacterial infection and continue ear powder treatment as per ENT specialist. Follow up with the ENT specialist as scheduled. 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 Dust may have solidified in sinuses, necessitating steam treatments and saline rinses to prevent surgical intervention. Perform steam treatments and saline rinses, and purchase sinus rinsing supplies. Follow up with the ENT specialist in two days.  Inhalation pneumonitis from concrete dust exposure. Despite exposure, lung sounds are clear, indicating no acute lung involvement. Avoid concrete dust exposure using protective masks.      Orders Placed During this Encounter:   Meds ordered this encounter  Medications   amoxicillin -clavulanate (AUGMENTIN ) 875-125 MG tablet    Sig: Take 1 tablet by mouth 2 (two) times daily.    Dispense:  20 tablet    Refill:  0   predniSONE (DELTASONE) 20 MG tablet    Sig: Take 2 pills for 3 days, 1 pill for 4 days    Dispense:  10 tablet    Refill:  0      This document was synthesized by artificial intelligence ([employer]) using HIPAA-compliant recording of the clinical interaction;   We discussed the use of AI scribe software for clinical note transcription with the patient, who gave verbal consent to proceed. additional Info: This encounter employed state-of-the-art, real-time, collaborative documentation. The patient actively reviewed and assisted in updating their electronic medical record on a shared screen, ensuring transparency and facilitating joint problem-solving for the problem list, overview, and plan. This approach promotes accurate, informed care. The treatment plan was discussed and  reviewed in detail, including medication safety, potential side effects, and all patient questions. We confirmed understanding and comfort with the plan. Follow-up instructions were established, including contacting the office for any concerns, returning if symptoms worsen, persist, or new symptoms develop, and precautions for potential emergency department visits.

## 2023-09-04 NOTE — Assessment & Plan Note (Signed)
 Looks improved from prior but there is Persistent ear infection with pus despite treatment with antifungal powder from ENT - suspicious for secondary  bacterial in addition to fungal involvement. There is a visible 30 percent ovioid perforation in the right tympanic membrane.   The infection may contribute to a sore throat via drainage. Prescribe antibiotics for the bacterial infection and continue ear powder treatment as per ENT specialist. Follow up with the ENT specialist as scheduled.

## 2023-09-04 NOTE — Patient Instructions (Addendum)
 VISIT SUMMARY:  You came in today because of a sore throat and sinus congestion after being exposed to concrete dust. You have a history of ear issues and have been using a CPAP machine. You have been physically active and noticed that your throat symptoms worsen when you cough. Your lungs are clear, but you have drainage from your sinuses into your throat.  YOUR PLAN:  -EAR INFECTION WITH PUS: You have a persistent ear infection with pus, likely caused by bacteria and possibly fungi. This infection may be contributing to your sore throat. You will be prescribed antibiotics for the bacterial infection and should continue using the ear powder treatment as directed by your ear specialist. Please follow up with your ENT specialist as scheduled.  -SORE THROAT WITH SWELLING: Your sore throat with swelling is likely due to drainage from your ear infection and sinus congestion, possibly worsened by inhaling concrete dust. Strep throat is not suspected. You will be prescribed prednisone  to reduce the swelling and should use throat lozenges for relief. Additionally, perform steam treatments and saline rinses to help with the symptoms.  -SINUS CONGESTION DUE TO CONCRETE DUST: Your sinus congestion is caused by inhaling concrete dust, which may have blocked your sinuses and could lead to an infection. To avoid the need for surgical intervention, you should perform steam treatments and saline rinses. Please purchase sinus rinsing supplies and follow up with your ENT specialist in two days.  -INHALATION PNEUMONITIS DUE TO CONCRETE DUST: Inhalation pneumonitis is inflammation of the lungs caused by inhaling irritants like concrete dust. Although your lung sounds are clear, you should avoid further exposure to concrete dust by using protective masks.  INSTRUCTIONS:  Please follow up with your ENT specialist in two days for your sinus congestion and as scheduled for your ear infection. Continue using the ear powder  treatment and start the prescribed antibiotics. Perform steam treatments and saline rinses for your sore throat and sinus congestion. Avoid exposure to concrete dust by wearing protective masks.  ?? Immediate Self-Care Saline Nasal Rinse (e.g., Neti pot or saline spray): Helps clear concrete dust and mucus from nasal passages. Use sterile or distilled water only. Steam Inhalation: Helps loosen mucus and ease breathing. Inhale steam from a bowl of hot water or take a hot shower. Hydration: Drink plenty of fluids to thin mucus and soothe the throat. Avoid Further Dust Exposure: Stay away from construction or dusty environments. Use a mask (N95 or similar) if exposure is unavoidable. OTC Medications: Nasal decongestants (e.g., oxymetazoline) for 3 days max to reduce swelling. Antihistamines (e.g., loratadine or cetirizine ) if there's an allergic component. Pain relievers (e.g., ibuprofen or acetaminophen) for sore throat and ear pain.  ?? When to See a Doctor Seek medical attention if any of these occur: Trouble breathing through nose that's not improving Severe sore throat or difficulty swallowing Fever or signs of worsening infection Green/bloody mucus Facial pain or pressure (possible sinus infection) Worsening ear pain or drainage  ??Special Notes: Concrete dust contains silica, which can irritate the mucous membranes and, in high exposure, damage the lungs. If symptoms are severe, a chest X-ray or sinus imaging might be needed to rule out complications.

## 2023-09-06 ENCOUNTER — Ambulatory Visit (INDEPENDENT_AMBULATORY_CARE_PROVIDER_SITE_OTHER): Admitting: Otolaryngology

## 2023-09-06 ENCOUNTER — Encounter (INDEPENDENT_AMBULATORY_CARE_PROVIDER_SITE_OTHER): Payer: Self-pay | Admitting: Otolaryngology

## 2023-09-06 VITALS — BP 139/67 | HR 75

## 2023-09-06 DIAGNOSIS — Z8669 Personal history of other diseases of the nervous system and sense organs: Secondary | ICD-10-CM | POA: Diagnosis not present

## 2023-09-06 DIAGNOSIS — Z09 Encounter for follow-up examination after completed treatment for conditions other than malignant neoplasm: Secondary | ICD-10-CM

## 2023-09-06 DIAGNOSIS — H9201 Otalgia, right ear: Secondary | ICD-10-CM

## 2023-09-06 DIAGNOSIS — H60331 Swimmer's ear, right ear: Secondary | ICD-10-CM

## 2023-09-07 NOTE — Progress Notes (Unsigned)
 Follow-up: Right acute fungal otitis externa  HPI: The patient is an 83 year old male who returns today for his follow-up evaluation.  He was last seen 2 weeks ago.  At that time, he was complaining of right ear pain and right ear muffled hearing.  He was noted to have an acute right fungal otitis externa.  He was treated with debridement and CSF powder.  The patient returns today reporting improvement in his right ear symptoms.  The right ear pain has resolved.  However, he continues to have a clogging sensation in the right ear.  He denies any otorrhea or vertigo.  Exam: General: Communicates without difficulty, well nourished, no acute distress. Head: Normocephalic, no evidence injury, no tenderness, facial buttresses intact without stepoff. Face/sinus: No tenderness to palpation and percussion. Facial movement is normal and symmetric. Eyes: PERRL, EOMI. No scleral icterus, conjunctivae clear. Neuro: CN II exam reveals vision grossly intact.  No nystagmus at any point of gaze. Ears: Auricles well formed without lesions.  Squamous debris is noted within the right ear canal.  Under the operative microscope, the right ear canal is debrided.  No acute infection is noted.  The left ear canal and tympanic membrane are normal.  Nose: External evaluation reveals normal support and skin without lesions.  Dorsum is intact.  Anterior rhinoscopy reveals congested mucosa over anterior aspect of inferior turbinates and intact septum.  No purulence noted. Oral:  Oral cavity and oropharynx are intact, symmetric, without erythema or edema.  Mucosa is moist without lesions. Neck: Full range of motion without pain.  There is no significant lymphadenopathy.  No masses palpable.  Thyroid  bed within normal limits to palpation.  Parotid glands and submandibular glands equal bilaterally without mass.  Trachea is midline. Neuro:  CN 2-12 grossly intact.   Assessment: 1.  The patient's acute right fungal otitis externa has  resolved.  Only squamous debris is noted within the right ear canal.  The right ear canal is debrided without difficulty. 2.  The left ear canal and tympanic membrane are normal.  Plan: 1.  Otomicroscopy with debridement of the right ear canal. 2.  The physical exam findings are reviewed with the patient. 3.  Right dry ear precaution. 4.  The patient will return for reevaluation in 3 months.

## 2023-09-11 ENCOUNTER — Other Ambulatory Visit (HOSPITAL_BASED_OUTPATIENT_CLINIC_OR_DEPARTMENT_OTHER): Payer: Self-pay

## 2023-09-11 ENCOUNTER — Ambulatory Visit (INDEPENDENT_AMBULATORY_CARE_PROVIDER_SITE_OTHER): Admitting: Internal Medicine

## 2023-09-11 ENCOUNTER — Encounter: Payer: Self-pay | Admitting: Internal Medicine

## 2023-09-11 ENCOUNTER — Ambulatory Visit: Admitting: Internal Medicine

## 2023-09-11 VITALS — BP 112/62 | HR 68 | Temp 98.0°F | Ht 70.5 in | Wt 175.2 lb

## 2023-09-11 DIAGNOSIS — J302 Other seasonal allergic rhinitis: Secondary | ICD-10-CM | POA: Insufficient documentation

## 2023-09-11 DIAGNOSIS — R413 Other amnesia: Secondary | ICD-10-CM | POA: Insufficient documentation

## 2023-09-11 DIAGNOSIS — L519 Erythema multiforme, unspecified: Secondary | ICD-10-CM

## 2023-09-11 DIAGNOSIS — G4733 Obstructive sleep apnea (adult) (pediatric): Secondary | ICD-10-CM

## 2023-09-11 DIAGNOSIS — S0096XA Insect bite (nonvenomous) of unspecified part of head, initial encounter: Secondary | ICD-10-CM | POA: Diagnosis not present

## 2023-09-11 DIAGNOSIS — W57XXXA Bitten or stung by nonvenomous insect and other nonvenomous arthropods, initial encounter: Secondary | ICD-10-CM | POA: Diagnosis not present

## 2023-09-11 DIAGNOSIS — G609 Hereditary and idiopathic neuropathy, unspecified: Secondary | ICD-10-CM

## 2023-09-11 DIAGNOSIS — N182 Chronic kidney disease, stage 2 (mild): Secondary | ICD-10-CM

## 2023-09-11 DIAGNOSIS — Z79899 Other long term (current) drug therapy: Secondary | ICD-10-CM | POA: Diagnosis not present

## 2023-09-11 DIAGNOSIS — H6611 Chronic tubotympanic suppurative otitis media, right ear: Secondary | ICD-10-CM | POA: Diagnosis not present

## 2023-09-11 MED ORDER — FLUTICASONE PROPIONATE 50 MCG/ACT NA SUSP
1.0000 | Freq: Every day | NASAL | 11 refills | Status: DC
Start: 2023-09-11 — End: 2023-10-07

## 2023-09-11 MED ORDER — TIRZEPATIDE-WEIGHT MANAGEMENT 2.5 MG/0.5ML ~~LOC~~ SOAJ: 2.5000 mg | SUBCUTANEOUS | 11 refills | Status: DC

## 2023-09-11 MED ORDER — DOXYCYCLINE HYCLATE 100 MG PO TABS: 100.0000 mg | ORAL_TABLET | Freq: Two times a day (BID) | ORAL | 0 refills | Status: DC

## 2023-09-11 NOTE — Assessment & Plan Note (Signed)
 He reports occasional memory lapses, possibly related to sleep apnea, and is undergoing memory testing as part of a hearing aid study. Zepbound is considered for memory loss prevention, pending insurance approval. Continue memory testing as part of the study.

## 2023-09-11 NOTE — Assessment & Plan Note (Signed)
 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 Current medication, pregabalin  (Lyrica ) 200 mg twice daily, is inadequate. Consider increasing to three times daily if needed. Referred to a physical medicine and rehabilitation physician for specialized treatment of nerve pain.

## 2023-09-11 NOTE — Assessment & Plan Note (Signed)
 He has sleep apnea and uses a CPAP with a full mask, reporting a dry throat when sleeping on his back. Discussed the potential link between sleep apnea and early Alzheimer's disease. Zepbound is considered for treating sleep apnea and preventing Alzheimer's, pending insurance approval. Advise avoiding back sleeping using a tennis ball or specialized shirt.

## 2023-09-11 NOTE — Assessment & Plan Note (Signed)
 Struggles to manage his medications with memory issues- will get [facility] pill pack evaluation. Also he'd like all his medications lined up at pharmacy.

## 2023-09-11 NOTE — Assessment & Plan Note (Signed)
 His chronic kidney disease is recently checked and well-managed. No new blood work is needed.

## 2023-09-11 NOTE — Assessment & Plan Note (Signed)
 Refills flonase 

## 2023-09-11 NOTE — Addendum Note (Signed)
 Addended by: Daliya Parchment G on: 09/11/2023 07:18 PM   Modules accepted: Level of Service

## 2023-09-11 NOTE — Patient Instructions (Signed)
 VISIT SUMMARY:  During your visit, we addressed your worsening nerve pain, recent tick bites, and other ongoing health concerns. We discussed your current medications and made some adjustments to better manage your symptoms. We also reviewed your chronic conditions and provided recommendations for your overall health maintenance.  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 Lyme disease is an infection caused by bacteria transmitted through tick bites. We are prescribing doxycycline to replace Augmentin  to ensure proper treatment.  -IDIOPATHIC PERIPHERAL NEUROPATHY: This condition involves nerve damage that causes pain, often linked to past exposure to harmful substances like paint fumes. Your current medication, Lyrica , is not fully controlling your symptoms, so we may increase the dose to three times daily if needed. You are also referred to a specialist for further treatment.  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 You should continue using the ear spray once a week for three weeks. We will follow up in three months to check if the eardrum has healed or if it needs patching.  -SLEEP APNEA: Sleep apnea is a condition where breathing repeatedly stops and starts during sleep. You use a CPAP machine but experience a dry throat when sleeping on your back. We discussed avoiding back sleeping and are considering a new treatment, Zepbound, pending insurance approval.  -EARLY DEMENTIA: You have reported occasional memory lapses, which may be related to sleep apnea. We are considering Zepbound to help prevent memory loss, pending insurance approval. Continue with the memory testing as part of your hearing aid study.  -CHRONIC KIDNEY DISEASE: This is a long-term condition where the kidneys do not work as well as they should. Your recent labs show  that it is well-managed, and no new blood work is needed at this time.  -GENERAL HEALTH MAINTENANCE: You are taking B12 supplements for nerve health. We discussed using a pill pack system to help manage your medications more easily. Continue with the B12 supplements and work with the pharmacy team to set up the pill pack system.  INSTRUCTIONS:  Please schedule a follow-up appointment in one month to assess the effectiveness of the pill pack system and other treatment plans.

## 2023-09-11 NOTE — Progress Notes (Signed)
 ==============================  [facility]: [id number]   -- Medical Office Visit --  Patient: Stephen Anderson      Age: 83 y.o.       Sex:  male  Date:   09/11/2023 Today's Healthcare Provider: Anthon Kins, MD  ==============================   Chief Complaint: Leg Pain (Having leg and knee pain feet legs on fire mostly at night not able to sleep.)   Discussed the use of AI scribe software for clinical note transcription with the patient, who gave verbal consent to proceed.  History of Present Illness  83 year old male who presents with worsening nerve pain legs (chronic)and  new tick bites, also in follow up to recent sinus / ear infection(s) with tympanic membrane rupture being comanaged by ENT.  He has experienced two tick bites, one of which he observed the tick, raising concerns about Lyme disease. He has previously been prescribed Augmentin  for a sinus infection, which has since resolved.  He has a history of idiopathic neuropathic peripheral neuropathy, potentially linked to past exposure to paint fumes. He describes a burning sensation from his knee to his toes, particularly at night, which disrupts his sleep. He uses a percussion gun for relief and is currently on Lyrica  200 mg twice a day, which he feels is not adequately controlling his symptoms. He also takes alpha lipoic acid and fish oil.  He has a perforated eardrum and is using a spray once a week for three weeks. A follow-up appointment is scheduled in three months to monitor healing.  He has sleep apnea and uses a CPAP machine with a full mask. He reports issues with dry throat when sleeping on his back and mentions occasional memory issues, which he attributes to sleep apnea.  He has chronic kidney disease, which was stable on recent labs. He is taking B12 supplements for nerve health.   Updated Problem List Entries: Problem  Memory Impairment  Polypharmacy  Seasonal  Allergic Rhinitis    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, not believe to be due to lupus or systemic sclerosis; Diverticular disease; Right inguinal hernia; Aortic atherosclerosis (HCC); Bronchiectasis (HCC); Recurrent kidney stones; Calcification of prostate; DDD (degenerative disc disease), lumbar; LVH (left ventricular hypertrophy); Grade I diastolic dysfunction; Weight loss; Gastritis; Hiatal hernia; Lumbar back pain; Arthritis of both hands; Trigger finger, right ring finger; Chronic kidney disease, stage 2 (mild); Prediabetes; 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 Past Surgical History:   has a past surgical history that  includes Gallbladder surgery; Hernia repair; Cataract extraction, bilateral; and  Melanoma excision. Social History:   reports that he quit smoking about 15 years ago. His smoking use included cigarettes. He started smoking about 65 years ago. He has a 50 pack-year smoking history. He has never been exposed to tobacco smoke. He quit smokeless tobacco use about 21 years ago. He reports that he does not drink alcohol and does not use drugs. Family History:  family history includes Bladder Cancer in his brother; Heart disease in his brother; Lung cancer in his father; Neuropathy in his daughter. Allergies:  is allergic to hydrocodone, oxycodone, oxycontin [oxycodone hcl], and simvastatin.   Medication Reconciliation: Current Outpatient Medications on File Prior to Visit  Medication Sig   Acetaminophen (TYLENOL ARTHRITIS EXT RELIEF PO) Take 1 tablet by mouth every other day.   Alpha-Lipoic Acid 300 MG TABS Take 2 tablets (600 mg total) by mouth daily at 6 (six) AM.   Ascorbic Acid (VITAMIN C PO) Take 500 mg by mouth daily.   aspirin 81 MG tablet Take 81 mg by mouth every other day.    atorvastatin  (LIPITOR) 40 MG tablet TAKE 1 TABLET BY MOUTH AT BEDTIME   caffeine 200 MG TABS tablet Take 200 mg by mouth in the morning and at bedtime. "STAY AWAKE"   celecoxib  (CELEBREX ) 200 MG capsule Take 1 capsule (200 mg total) by mouth daily.   Cholecalciferol (VITAMIN D -3) 125 MCG (5000 UT) TABS Take 1 tablet by mouth daily.   cilostazol  (PLETAL ) 50 MG tablet TAKE 1 TABLET BY MOUTH 2 TIMES A DAY   Clobetasol Propionate 0.05 % shampoo    Cyanocobalamin  (B-12 PO) Take 1 tablet by mouth daily.   diclofenac  Sodium (VOLTAREN ) 1 % GEL Apply 2 g topically in the morning and at bedtime. Every morning and every night.   Ferrous Sulfate (IRON PO) Take 65 mg by mouth daily.   fluticasone  (CUTIVATE ) 0.05 % cream    ipratropium (ATROVENT ) 0.06 % nasal spray Place 1-2 sprays into both nostrils 4 (four) times daily. As needed  for nasal congestion   ketoconazole  (NIZORAL ) 2 % shampoo Apply to affected area- let sit 3-5 minutes before rinsing   lidocaine  (XYLOCAINE ) 5 % ointment Apply 1 Application topically as needed.   MAGNESIUM PO Take 400 mg by mouth daily.   NIFEdipine  (PROCARDIA  XL) 60 MG 24 hr tablet Take 1 tablet (60 mg total) by mouth daily. Dose increased to help improve blood flow and energy. (Patient taking differently: Take 60 mg by mouth daily. Dose increased to help improve blood flow and energy. Patient reported taking 30 mg.)   NIFEdipine  (PROCARDIA -XL/NIFEDICAL-XL) 30 MG 24 hr tablet TAKE 1 TABLET BY MOUTH DAILY   NON FORMULARY CPAP   ofloxacin  (FLOXIN ) 0.3 % OTIC solution Place 5 drops into the right ear 2 (two) times daily.   ofloxacin  (OCUFLOX ) 0.3 % ophthalmic solution Place 1 drop into the right ear 2 (two) times daily.   omega-3 acid ethyl esters (LOVAZA ) 1 g capsule Take 2 capsules (2 g total) by mouth 2 (two) times daily.   omega-3 acid ethyl esters (LOVAZA ) 1 g capsule TAKE 2 CAPSULES BY MOUTH IN THE MORNING, AT NOON, AND AT BEDTIME   pantoprazole  (PROTONIX ) 40 MG tablet TAKE 1 TABLET BY MOUTH DAILY   pregabalin  (LYRICA ) 200 MG capsule Take 1 capsule (200 mg total) by mouth 2 (two) times daily. Dose increased   primidone  (MYSOLINE ) 50 MG tablet TAKE ONE TABLET BY MOUTH EVERY MORNING AND ONE TABLET EVERY EVENING   triamcinolone  cream (KENALOG )  0.1 % Apply 1 Application topically 2 (two) times daily. Up to 14 days use   UNABLE TO FIND 100 mg in the morning and at bedtime. Out back oil.   neomycin -polymyxin-hydrocortisone (CORTISPORIN) OTIC solution Place 3 drops into the left ear 3 (three) times daily for 3 days.   No current facility-administered medications on file prior to visit.   Medications Discontinued During This Encounter  Medication Reason   amoxicillin -clavulanate (AUGMENTIN ) 875-125 MG tablet Completed Course   predniSONE  (DELTASONE ) 20 MG tablet Completed Course   fluticasone   (FLONASE ) 50 MCG/ACT nasal spray Reorder     Physical Exam:    09/11/2023    7:58 AM 09/06/2023    9:31 AM 09/04/2023   10:03 AM  Vitals with BMI  Height 5' 10.5"  5' 10.5"  Weight 175 lbs 3 oz  179 lbs 6 oz  BMI 24.77  25.37  Systolic 112 139 409  Diastolic 62 67 70  Pulse 68 75 70  Vital signs reviewed.  Nursing notes reviewed. Weight trend reviewed. Physical Exam General Appearance:  No acute distress appreciable.   Well-groomed, healthy-appearing male.  Well proportioned with no abnormal fat distribution.  Good muscle tone. Pulmonary:  Normal work of breathing at rest, no respiratory distress apparent. SpO2: 98 %  Musculoskeletal: All extremities are intact.  Neurological:  Awake, alert, oriented, and engaged.  No obvious focal neurological deficits or cognitive impairments.  Sensorium seems unclouded.   Speech is clear and coherent with logical content. Psychiatric:  Appropriate mood, pleasant and cooperative demeanor, thoughtful and engaged during the exam       No results found for any visits on 09/11/23. Office Visit on 08/21/2023  Component Date Value   MICRO NUMBER: 08/21/2023 [id number]    SPECIMEN QUALITY: 08/21/2023 Adequate    Source: 08/21/2023 NOT GIVEN    STATUS: 08/21/2023 FINAL    GRAM STAIN: 08/21/2023 No epithelial cells seen No white blood cells seen No organisms seen    ANA RESULT: 08/21/2023 No anaerobes isolated.    MICRO NUMBER: 08/21/2023 [id number]    SPECIMEN QUALITY: 08/21/2023 Adequate    SOURCE: 08/21/2023 NOT GIVEN    STATUS: 08/21/2023 FINAL    AER RESULT: 08/21/2023 No Growth    COMMENT: 08/21/2023 No source was provided. The specimen was tested and reported based upon the test code ordered. If this is incorrect, please contact client services.   Office Visit on 08/14/2023  Component Date Value   WBC 08/14/2023 11.4 (H)    RBC 08/14/2023 4.23    Hemoglobin 08/14/2023 13.0    HCT 08/14/2023 38.3 (L)    MCV 08/14/2023 90.5    MCHC 08/14/2023  33.9    RDW 08/14/2023 13.9    Platelets 08/14/2023 228.0    Neutrophils Relative % 08/14/2023 79.4 (H)    Lymphocytes Relative 08/14/2023 13.4    Monocytes Relative 08/14/2023 6.2    Eosinophils Relative 08/14/2023 0.2    Basophils Relative 08/14/2023 0.8    Neutro Abs 08/14/2023 9.0 (H)    Lymphs Abs 08/14/2023 1.5    Monocytes Absolute 08/14/2023 0.7    Eosinophils Absolute 08/14/2023 0.0    Basophils Absolute 08/14/2023 0.1    Sodium 08/14/2023 137    Potassium 08/14/2023 4.8    Chloride 08/14/2023 105    CO2 08/14/2023 24    Glucose, Bld 08/14/2023 119 (H)    BUN 08/14/2023 27 (H)    Creatinine, Ser 08/14/2023 1.11    Total Bilirubin 08/14/2023 0.2    Alkaline  Phosphatase 08/14/2023 74    AST 08/14/2023 28    ALT 08/14/2023 38    Total Protein 08/14/2023 7.0    Albumin 08/14/2023 3.7    GFR 08/14/2023 62.43    Calcium  08/14/2023 8.7    Pro B Natriuretic peptid* 08/14/2023 24.0    CRP 08/14/2023 4.0    Sed Rate 08/14/2023 44 (H)    Vitamin B-12 08/14/2023 634    Folate 08/14/2023 13.1    SARS Coronavirus 2 Ag 08/14/2023 Negative   Office Visit on 07/31/2023  Component Date Value   WBC 07/31/2023 7.0    RBC 07/31/2023 4.75    Hemoglobin 07/31/2023 14.5    HCT 07/31/2023 43.5    MCV 07/31/2023 91.7    MCHC 07/31/2023 33.3    RDW 07/31/2023 14.0    Platelets 07/31/2023 167.0    Neutrophils Relative % 07/31/2023 64.7    Lymphocytes Relative 07/31/2023 24.9    Monocytes Relative 07/31/2023 7.5    Eosinophils Relative 07/31/2023 2.3    Basophils Relative 07/31/2023 0.6    Neutro Abs 07/31/2023 4.5    Lymphs Abs 07/31/2023 1.7    Monocytes Absolute 07/31/2023 0.5    Eosinophils Absolute 07/31/2023 0.2    Basophils Absolute 07/31/2023 0.0    Sodium 07/31/2023 138    Potassium 07/31/2023 4.2    Chloride 07/31/2023 104    CO2 07/31/2023 28    Glucose, Bld 07/31/2023 101 (H)    BUN 07/31/2023 27 (H)    Creatinine, Ser 07/31/2023 1.18    Total Bilirubin 07/31/2023  0.6    Alkaline Phosphatase 07/31/2023 59    AST 07/31/2023 16    ALT 07/31/2023 14    Total Protein 07/31/2023 6.7    Albumin 07/31/2023 4.4    GFR 07/31/2023 58.03 (L)    Calcium  07/31/2023 8.8    Phosphorus 07/31/2023 2.6    VITD 07/31/2023 47.95    PTH 07/31/2023 49    Uric Acid, Serum 07/31/2023 5.8    Color, Urine 07/31/2023 YELLOW    APPearance 07/31/2023 CLEAR    Specific Gravity, Urine 07/31/2023 1.019    pH 07/31/2023 6.0    Glucose, UA 07/31/2023 NEGATIVE    Bilirubin Urine 07/31/2023 NEGATIVE    Ketones, ur 07/31/2023 NEGATIVE    Hgb urine dipstick 07/31/2023 NEGATIVE    Protein, ur 07/31/2023 NEGATIVE    Nitrites, Initial 07/31/2023 NEGATIVE    Leukocyte Esterase 07/31/2023 NEGATIVE    WBC, UA 07/31/2023 NONE SEEN    RBC / HPF 07/31/2023 NONE SEEN    Squamous Epithelial / HPF 07/31/2023 NONE SEEN    Bacteria, UA 07/31/2023 NONE SEEN    Hyaline Cast 07/31/2023 NONE SEEN    Note 07/31/2023     Vitamin B-12 07/31/2023 483    Methylmalonic Acid, Quant 07/31/2023 106    Ceruloplasmin 07/31/2023 20    Zinc  07/31/2023 60    Total Protein 07/31/2023 6.4    Albumin ELP 07/31/2023 4.1    Alpha 1 07/31/2023 0.2    Alpha 2 07/31/2023 0.7    Beta Globulin 07/31/2023 0.4    Beta 2 07/31/2023 0.3    Gamma Globulin 07/31/2023 0.7 (L)    Abnormal Protein Band1 07/31/2023     SPE Interp. 07/31/2023     Protein, Ur 08/02/2023 9.3    Protein, 24H Urine 08/02/2023 Comment    ALBUMIN, U 08/02/2023 100.0    ALPHA 1 URINE 08/02/2023 0.0    ALPHA-2-GLOBULIN, U 08/02/2023 0.0    % BETA, Urine 08/02/2023  0.0    GAMMA GLOBULIN URINE 08/02/2023 0.0    M-SPIKE, % 08/02/2023 Not Observed    M-Spike, mg/24 hr 08/02/2023 Comment    Immunofixation, Urine 08/02/2023 Comment    NOTE: 08/02/2023 Comment    Free Kappa Lt Chains,Ur 08/02/2023 45.86    Free Lambda Lt Chains,Ur 08/02/2023 8.15    Kappa/Lambda Ratio,U 08/02/2023 5.63    ANCA SCREEN 07/31/2023 Negative    Sed Rate  07/31/2023 4    CRP 07/31/2023 <1.0    Reflexve Urine Culture 07/31/2023    Office Visit on 03/27/2023  Component Date Value   Vitamin B-12 03/27/2023 1,492 (H)    Folate 03/27/2023 16.0    Hgb A1c MFr Bld 03/27/2023 5.9    Ferritin 03/27/2023 53.8    Sodium 03/27/2023 142    Potassium 03/27/2023 4.3    Chloride 03/27/2023 105    CO2 03/27/2023 30    Glucose, Bld 03/27/2023 109 (H)    BUN 03/27/2023 21    Creatinine, Ser 03/27/2023 1.03    Total Bilirubin 03/27/2023 0.5    Alkaline Phosphatase 03/27/2023 68    AST 03/27/2023 17    ALT 03/27/2023 14    Total Protein 03/27/2023 6.8    Albumin 03/27/2023 4.5    GFR 03/27/2023 68.48    Calcium  03/27/2023 8.9    WBC 03/27/2023 6.3    RBC 03/27/2023 4.74    Hemoglobin 03/27/2023 14.6    HCT 03/27/2023 43.8    MCV 03/27/2023 92.3    MCHC 03/27/2023 33.3    RDW 03/27/2023 14.0    Platelets 03/27/2023 185.0    Neutrophils Relative % 03/27/2023 60.8    Lymphocytes Relative 03/27/2023 27.2    Monocytes Relative 03/27/2023 9.0    Eosinophils Relative 03/27/2023 2.3    Basophils Relative 03/27/2023 0.7    Neutro Abs 03/27/2023 3.8    Lymphs Abs 03/27/2023 1.7    Monocytes Absolute 03/27/2023 0.6    Eosinophils Absolute 03/27/2023 0.1    Basophils Absolute 03/27/2023 0.0    Cholesterol 03/27/2023 123    Triglycerides 03/27/2023 81.0    HDL 03/27/2023 41.00    VLDL 03/27/2023 16.2    LDL Cholesterol 03/27/2023 65    Total CHOL/HDL Ratio 03/27/2023 3    NonHDL 03/27/2023 81.51    VITD 03/27/2023 34.36    Anti Nuclear Antibody (A* 03/27/2023 Negative    Rheumatoid fact SerPl-aC* 03/27/2023 <10    T3 Uptake 03/27/2023 28    T4, Total 03/27/2023 7.5    Free Thyroxine Index 03/27/2023 2.1    TSH 03/27/2023 1.05    Arsenic, 24H Ur 04/04/2023 <10    Lead, 24 hr urine 04/04/2023 <10    Mercury, 24H Ur 04/04/2023 <4   Office Visit on 09/24/2022  Component Date Value   Rheumatoid fact SerPl-aC* 09/24/2022 <10    Sed Rate  09/24/2022 2    Scleroderma (Scl-70) (EN* 09/24/2022 <1.0 NEG    Ribonucleic Protein(ENA)* 09/24/2022 <1.0 NEG    ENA SM Ab Ser-aCnc 09/24/2022 <1.0 NEG    SSA (Ro) (ENA) Antibody,* 09/24/2022 <1.0 NEG    ds DNA Ab 09/24/2022 1    C3 Complement 09/24/2022 124    C4 Complement 09/24/2022 21   No image results found. DG Chest 2 View Result Date: 08/16/2023 CLINICAL DATA:  Chest congestion.  Short of breath. EXAM: CHEST - 2 VIEW COMPARISON:  01/14/2019.  CT, 05/02/2022. FINDINGS: Cardiac silhouette is normal in size. No mediastinal or hilar masses. No evidence of adenopathy. Lungs  are hyperexpanded. There is opacity in the retrocardiac region of the left lower lobe. Minor linear scarring is noted lateral to this, which is stable. Remainder of the lungs is clear. Relative lucency in the upper lobes suggests emphysema. No pleural effusion or pneumothorax. Skeletal structures are intact. IMPRESSION: 1. Left lower lobe opacity consistent with pneumonia or atelectasis. 2. No other evidence of acute cardiopulmonary disease. 3. Emphysema. Electronically Signed   By: Amanda Jungling M.D.   On: 08/16/2023 10:39        05/17/2023    8:48 AM 05/01/2023    8:35 AM 03/27/2023    8:50 AM 01/16/2023    8:45 AM  PHQ 2/9 Scores  PHQ - 2 Score 0 0 0 1  PHQ- 9 Score  2 1 4    Results LABS Blood counts: Elevated (08/2023) Chronic kidney disease monitoring: Stable (08/2023)    Assessment & Plan Idiopathic peripheral neuropathy He has longstanding idiopathic peripheral neuropathy likely related to past paint fume exposure, with a recent flare-up causing burning pain from knee to toes. Current medication, pregabalin  (Lyrica ) 200 mg twice daily, is inadequate. Consider increasing to three times daily if needed. Referred to a physical medicine and rehabilitation physician for specialized treatment of nerve pain. Tick bite of head, unspecified part, initial encounter He presented with two tick bites, one confirmed as a  tick, and rashes resembling Lyme disease. Previously treated with Augmentin  for a sinus infection, which does not cover Lyme disease. Prescribe doxycycline to replace Augmentin . Erythema multiforme He presented with two tick bites, one confirmed as a tick, and rashes resembling Lyme disease. Previously treated with Augmentin  for a sinus infection, which does not cover Lyme disease. Prescribe doxycycline to replace Augmentin . OSA on CPAP He has sleep apnea and uses a CPAP with a full mask, reporting a dry throat when sleeping on his back. Discussed the potential link between sleep apnea and early Alzheimer's disease. Zepbound is considered for treating sleep apnea and preventing Alzheimer's, pending insurance approval. Advise avoiding back sleeping using a tennis ball or specialized shirt. Memory impairment He reports occasional memory lapses, possibly related to sleep apnea, and is undergoing memory testing as part of a hearing aid study. Zepbound is considered for memory loss prevention, pending insurance approval. Continue memory testing as part of the study. Polypharmacy Struggles to manage his medications with memory issues- will get vbci pill pack evaluation. Also he'd like all his medications lined up at pharmacy. 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 He is down to spraying the ear once a week for three weeks. A hole in the eardrum may require patching if not self-healing. Continue the current ear spray regimen and follow up with the ear doctor in three months to assess eardrum healing. Chronic kidney disease, stage 2 (mild) His chronic kidney disease is recently checked and well-managed. No new blood work is needed. General Health Maintenance   He is taking B12 supplements for nerve health. Discussed a pill pack system to simplify medication  management. Continue B12 supplementation and refer to the pharmacy team to set up a pill pack system.  Follow-up   Schedule a follow-up appointment in one month to assess the effectiveness of the pill pack system and other treatment plans.       Orders Placed During this Encounter:   Orders Placed This Encounter  Procedures   Ambulatory referral to Physical Medicine  Rehab    Referral Priority:   Routine    Referral Type:   Rehabilitation    Referral Reason:   Specialty Services Required    Requested Specialty:   Physical Medicine and Rehabilitation    Number of Visits Requested:   1   AMB Referral [facility] Care Management    Referral Priority:   Routine    Referral Type:   Consultation    Referral Reason:   Care Coordination    Number of Visits Requested:   1   Meds ordered this encounter  Medications   doxycycline (VIBRA-TABS) 100 MG tablet    Sig: Take 1 tablet (100 mg total) by mouth 2 (two) times daily.    Dispense:  20 tablet    Refill:  0   tirzepatide (ZEPBOUND) 2.5 MG/0.5ML Pen    Sig: Inject 2.5 mg into the skin once a week.    Dispense:  2 mL    Refill:  11   fluticasone  (FLONASE ) 50 MCG/ACT nasal spray    Sig: Place 1 spray into both nostrils daily. As needed.    Dispense:  18.2 mL    Refill:  11       This document was synthesized by artificial intelligence ([employer]bridge) using HIPAA-compliant recording of the clinical interaction;   We discussed the use of AI scribe software for clinical note transcription with the patient, who gave verbal consent to proceed. additional Info: This encounter employed state-of-the-art, real-time, collaborative documentation. The patient actively reviewed and assisted in updating their electronic medical record on a shared screen, ensuring transparency and facilitating joint problem-solving for the problem list, overview, and plan. This approach promotes accurate, informed care. The treatment plan was discussed and reviewed in detail,  including medication safety, potential side effects, and all patient questions. We confirmed understanding and comfort with the plan. Follow-up instructions were established, including contacting the office for any concerns, returning if symptoms worsen, persist, or new symptoms develop, and precautions for potential emergency department visits.

## 2023-09-11 NOTE — Assessment & Plan Note (Signed)
 The ear infection has resolved, confirmed by the ear doctor. He is down to spraying the ear once a week for three weeks. A hole in the eardrum may require patching if not self-healing. Continue the current ear spray regimen and follow up with the ear doctor in three months to assess eardrum healing.

## 2023-09-12 ENCOUNTER — Telehealth: Payer: Self-pay | Admitting: *Deleted

## 2023-09-12 NOTE — Progress Notes (Signed)
 Care Guide Pharmacy Note  09/12/2023 Name: Stephen Anderson MRN: [id number] DOB: [birth year]  Referred By: Anthon Kins, MD Reason for referral: Complex Care Management (Outreach to schedule referral with pharmacist )   Stephen Anderson is a 83 y.o. year old male who is a primary care patient of Anthon Kins, MD.  Stephen Anderson was referred to the pharmacist for assistance related to: pill pack   Successful contact was made with the patient to discuss pharmacy services including being ready for the pharmacist to call at least 5 minutes before the scheduled appointment time and to have medication bottles and any blood pressure readings ready for review. The patient agreed to meet with the pharmacist via telephone visit on 09/17/2023  Stephen Anderson, CMA [facility] Guide Direct Dial: [phone]  Fax: [phone] Website: [email or web address]

## 2023-09-13 ENCOUNTER — Ambulatory Visit: Admitting: Internal Medicine

## 2023-09-17 ENCOUNTER — Other Ambulatory Visit: Payer: Self-pay | Admitting: Pharmacist

## 2023-09-17 ENCOUNTER — Encounter: Payer: Self-pay | Admitting: Pharmacist

## 2023-09-17 ENCOUNTER — Telehealth: Payer: Self-pay

## 2023-09-17 ENCOUNTER — Telehealth: Payer: Self-pay | Admitting: Pharmacist

## 2023-09-17 ENCOUNTER — Other Ambulatory Visit (HOSPITAL_COMMUNITY): Payer: Self-pay

## 2023-09-17 NOTE — Telephone Encounter (Signed)
 Pharmacy Patient Advocate Encounter   Received notification from CoverMyMeds that prior authorization for Zepbound  2.5MG /0.5ML pen-injectors is required/requested.   Insurance verification completed.   The patient is insured through [facility] ADV[employer]/RX ADVANCE .   Per test claim: PA required; PA submitted to above mentioned insurance via CoverMyMeds Key/confirmation #/EOC [id number] Status is pending

## 2023-09-17 NOTE — Telephone Encounter (Signed)
 Pharmacy Patient Advocate Encounter  Received notification from [facility] [employer]VANTAGE/RX ADVANCE that Prior Authorization for Zepbound  2.5MG /0.5ML pen-injectors has been DENIED.  Full denial letter will be uploaded to the media tab. See denial reason below.   PA #/Case ID/Reference #: Key: [id number]

## 2023-09-17 NOTE — Progress Notes (Signed)
 09/17/2023 Name: Stephen Anderson MRN: [id number] DOB: 1940/10/06  Chief Complaint  Patient presents with   Medication Adherence   Medication Management    Stephen Anderson is a 83 y.o. year old male who presented for a telephone visit.   They were referred to the pharmacist by their PCP for assistance in managing complex medication management.    Subjective:  Care Team: Primary Care Provider: Anthon Kins, MD ; Next Scheduled Visit: 10/15/2023 ENT: Dr Darlin Ehrlich - Next Scheduled appointment: 12/13/2023  Medication Access/Adherence  Patient's PCP was concerned about medication adherence and patient had mentioned that he would like to have all his medications filled on the same day for 90 days supply.  Dr Boston Byers would like for patient to use adherence packs.   Current Pharmacy:  [facility] [id number] - [address] [place] - [address] [place] [address] Phone: [phone] Fax: [phone]  [facility] [id number] - [place], [place] - [address] [place] [address] Phone: [phone] Fax: [phone]   Patient reports affordability concerns with their medications: No  Patient reports access/transportation concerns to their pharmacy: No  Patient reports adherence concerns with their medications:  No     Reviewed medications list with patient and refill history.  Patient has 2 doses of nifedipine  on his medication list - 30mg  last filled 05/01/2023 for 90 day supply. Patient has never filled nifedipine  60mg  dose. Looks like nifedipine  was prescribed for Raynaud's syndrome.  Atorvastatin  was last refilled 03/25/2023 for 90 days. Last Rx was [facility] to [facility] [address] but patient uses [facility] [address].  Caffeine tablets - med list had that he was taking each morning and at bedtime. Verified with patient is he only taking in the morning - to help with fatigue / morning drowsiness.   Cilostazol  50mg  twice a day - last refilled 06/25/2023 for 45 day supply. Reminded patient refill is due. He also was not sure why he was taking cilostazol   Patient reported that he was not taking the following medications that were on his med list - OutBack Oil, Zepbound  (prior authorization needed), ciprofloxacin ophthalmic suspension,    Objective:  Lab Results  Component Value Date   HGBA1C 5.9 03/27/2023    Lab Results  Component Value Date   CREATININE 1.11 08/14/2023   BUN 27 (H) 08/14/2023   NA 137 08/14/2023   K 4.8 08/14/2023   CL 105 08/14/2023   CO2 24 08/14/2023    Lab Results  Component Value Date   CHOL 123 03/27/2023   HDL 41.00 03/27/2023   LDLCALC 65 03/27/2023   LDLDIRECT 84.0 03/06/2022   TRIG 81.0 03/27/2023   CHOLHDL 3 03/27/2023    Medications Reviewed Today     Reviewed by Cecilie Coffee, RPH-CPP (Pharmacist) on 09/17/23 at 1530  Med List Status: <None>   Medication Order Taking? Sig Documenting Provider Last Dose Status Informant  Acetaminophen (TYLENOL ARTHRITIS EXT RELIEF PO) [id number] Yes Take 1 tablet by mouth every other day. [provider] Taking Active            Med Note Jillyn Motto   Tue May 24, 2020  7:52 AM)    Alpha-Lipoic Acid 300 MG TABS [id number] Yes Take 2 tablets (600 mg total) by mouth daily at 6 (six) AM. Anthon Kins, MD Taking Active   Ascorbic Acid (VITAMIN C PO) [id number] Yes Take 500 mg by mouth daily. [provider] Taking Active   aspirin 81 MG tablet [id number] Yes Take 81 mg by mouth every other day.  [provider] Taking Active   atorvastatin  (LIPITOR) 40 MG tablet [id number] Yes TAKE 1 TABLET BY MOUTH AT BEDTIME Anthon Kins, MD Taking Active   caffeine 200 MG TABS tablet [id number] Yes Take 200 mg by mouth in the morning. "STAY AWAKE" [provider] Taking Active   celecoxib  (CELEBREX ) 200 MG capsule [id number] Yes Take 1 capsule (200 mg total) by mouth daily.  Anthon Kins, MD Taking Active   Cholecalciferol (VITAMIN D -3) 125 MCG (5000 UT) TABS [id number] Yes Take 1 tablet by mouth daily. [provider] Taking Active   cilostazol  (PLETAL ) 50 MG tablet [id number] Yes TAKE 1 TABLET BY MOUTH 2 TIMES A DAY Anthon Kins, MD Taking Active   Cyanocobalamin  (B-12 PO) [id number] Yes Take 1 tablet by mouth daily. [provider] Taking Active   diclofenac  Sodium (VOLTAREN ) 1 % GEL [id number]  Apply 2 g topically in the morning and at bedtime. Every morning and every night. Anthon Kins, MD  Active   doxycycline  (VIBRA -TABS) 100 MG tablet [id number] Yes Take 1 tablet (100 mg total) by mouth 2 (two) times daily. Anthon Kins, MD Taking Active   Ferrous Sulfate (IRON PO) [id number]  Take 65 mg by mouth daily. [provider]  Active   fluticasone  (FLONASE ) 50 MCG/ACT nasal spray [id number]  Place 1 spray into both nostrils daily. As needed. Anthon Kins, MD  Active   ketoconazole  (NIZORAL ) 2 % shampoo [id number] Yes Apply to affected area- let sit 3-5 minutes before rinsing Anthon Kins, MD Taking Active   lidocaine  (XYLOCAINE ) 5 % ointment [id number]  Apply 1 Application topically as needed. Anthon Kins, MD  Active   MAGNESIUM PO [id number]  Take 400 mg by mouth daily. [provider]  Active   neomycin -polymyxin-hydrocortisone (CORTISPORIN) OTIC solution [id number]  Place 3 drops into the left ear 3 (three) times daily for 3 days. Anthon Kins, MD  Expired 05/20/23 2359   NIFEdipine  (PROCARDIA -XL/NIFEDICAL-XL) 30 MG 24 hr tablet [id number] Yes TAKE 1 TABLET BY MOUTH DAILY Anthon Kins, MD Taking Active   NON FORMULARY [id number] Yes CPAP [provider] Taking Active   ofloxacin  (FLOXIN ) 0.3 % OTIC solution [id number] Yes Place 5 drops into the right ear 2 (two) times daily. Anthon Kins, MD Taking Active   omega-3 acid ethyl esters (LOVAZA ) 1 g capsule [id number] Yes Take 2 capsules (2 g total)  by mouth 2 (two) times daily. Anthon Kins, MD Taking Active   pantoprazole  (PROTONIX ) 40 MG tablet [id number] Yes TAKE 1 TABLET BY MOUTH DAILY Anthon Kins, MD Taking Active   pregabalin  (LYRICA ) 200 MG capsule [id number] Yes Take 1 capsule (200 mg total) by mouth 2 (two) times daily. Dose increased Anthon Kins, MD Taking Active   primidone  (MYSOLINE ) 50 MG tablet [id number] Yes TAKE ONE TABLET BY MOUTH EVERY MORNING AND ONE TABLET EVERY Antone Kiss, MD Taking Active   tirzepatide  (ZEPBOUND ) 2.5 MG/0.5ML Pen [id number] No Inject 2.5 mg into the skin once a week.  Patient not taking: Reported on 09/17/2023   Anthon Kins, MD Not Taking Active   triamcinolone  cream (KENALOG ) 0.1 % [id number] No Apply 1 Application topically 2 (two) times daily. Up to 14 days use  Patient not taking: Reported on 09/17/2023   Anthon Kins, MD Not Taking Active  Assessment/Plan:   Medication Management: - Currently strategy insufficient to maintain appropriate adherence to prescribed medication regimen - Discussed collaboration with local pharmacies for adherence packaging. Reviewed local pharmacies with adherence packaging options. Patient declined to start adherence packs. He prefers to continue to use [facility] as his [facility] and they do not have adherence packs.  - If he had decided to use adherence packs with [facility] pharmacy, the following meds would NOT have been able to have been included - aspirin (because of every other day dosing), pregabalin  (because it is a controlled medication), primidone  (because it is a controlled medication). - Suggested use of weekly pill box to organize medications - Offered to work with [facility] to at least sync his medication refills - patient declined.  - Coordinate with [facility] [address] to have atorvastatin  Rx transferred from [facility] [address].   Follow Up Plan: 2 to 3 months - will follow  adherence  Cecilie Coffee, PharmD Clinical Pharmacist [facility]

## 2023-09-17 NOTE — Telephone Encounter (Signed)
 Called number that was on the appointment schedule. Spoke with patient's wife but patient was not available. Per his wife he is on his way home from the [facility] and has a friend with him in the car.  Will call back at 3pm today - patient's wife states this is OK.

## 2023-09-24 DIAGNOSIS — D225 Melanocytic nevi of trunk: Secondary | ICD-10-CM | POA: Diagnosis not present

## 2023-09-24 DIAGNOSIS — L218 Other seborrheic dermatitis: Secondary | ICD-10-CM | POA: Diagnosis not present

## 2023-09-24 DIAGNOSIS — L821 Other seborrheic keratosis: Secondary | ICD-10-CM | POA: Diagnosis not present

## 2023-09-24 DIAGNOSIS — D485 Neoplasm of uncertain behavior of skin: Secondary | ICD-10-CM | POA: Diagnosis not present

## 2023-09-24 DIAGNOSIS — L814 Other melanin hyperpigmentation: Secondary | ICD-10-CM | POA: Diagnosis not present

## 2023-09-27 ENCOUNTER — Other Ambulatory Visit (HOSPITAL_BASED_OUTPATIENT_CLINIC_OR_DEPARTMENT_OTHER): Payer: Self-pay

## 2023-10-07 ENCOUNTER — Other Ambulatory Visit (HOSPITAL_BASED_OUTPATIENT_CLINIC_OR_DEPARTMENT_OTHER): Payer: Self-pay

## 2023-10-07 ENCOUNTER — Encounter: Payer: Self-pay | Admitting: Internal Medicine

## 2023-10-07 ENCOUNTER — Ambulatory Visit (INDEPENDENT_AMBULATORY_CARE_PROVIDER_SITE_OTHER): Admitting: Internal Medicine

## 2023-10-07 ENCOUNTER — Ambulatory Visit (INDEPENDENT_AMBULATORY_CARE_PROVIDER_SITE_OTHER)

## 2023-10-07 VITALS — BP 110/64 | HR 78 | Temp 97.3°F | Ht 70.5 in | Wt 166.6 lb

## 2023-10-07 DIAGNOSIS — N182 Chronic kidney disease, stage 2 (mild): Secondary | ICD-10-CM | POA: Diagnosis not present

## 2023-10-07 DIAGNOSIS — S0991XA Unspecified injury of ear, initial encounter: Secondary | ICD-10-CM

## 2023-10-07 DIAGNOSIS — L218 Other seborrheic dermatitis: Secondary | ICD-10-CM

## 2023-10-07 DIAGNOSIS — E559 Vitamin D deficiency, unspecified: Secondary | ICD-10-CM

## 2023-10-07 DIAGNOSIS — L519 Erythema multiforme, unspecified: Secondary | ICD-10-CM

## 2023-10-07 DIAGNOSIS — I7300 Raynaud's syndrome without gangrene: Secondary | ICD-10-CM

## 2023-10-07 DIAGNOSIS — E781 Pure hyperglyceridemia: Secondary | ICD-10-CM

## 2023-10-07 DIAGNOSIS — K30 Functional dyspepsia: Secondary | ICD-10-CM | POA: Diagnosis not present

## 2023-10-07 DIAGNOSIS — R1011 Right upper quadrant pain: Secondary | ICD-10-CM

## 2023-10-07 DIAGNOSIS — G629 Polyneuropathy, unspecified: Secondary | ICD-10-CM

## 2023-10-07 DIAGNOSIS — I700 Atherosclerosis of aorta: Secondary | ICD-10-CM | POA: Diagnosis not present

## 2023-10-07 DIAGNOSIS — K5900 Constipation, unspecified: Secondary | ICD-10-CM

## 2023-10-07 DIAGNOSIS — R5382 Chronic fatigue, unspecified: Secondary | ICD-10-CM

## 2023-10-07 DIAGNOSIS — G4733 Obstructive sleep apnea (adult) (pediatric): Secondary | ICD-10-CM

## 2023-10-07 DIAGNOSIS — R918 Other nonspecific abnormal finding of lung field: Secondary | ICD-10-CM | POA: Diagnosis not present

## 2023-10-07 DIAGNOSIS — M150 Primary generalized (osteo)arthritis: Secondary | ICD-10-CM | POA: Diagnosis not present

## 2023-10-07 DIAGNOSIS — R0602 Shortness of breath: Secondary | ICD-10-CM

## 2023-10-07 DIAGNOSIS — M7711 Lateral epicondylitis, right elbow: Secondary | ICD-10-CM

## 2023-10-07 DIAGNOSIS — G250 Essential tremor: Secondary | ICD-10-CM

## 2023-10-07 DIAGNOSIS — J302 Other seasonal allergic rhinitis: Secondary | ICD-10-CM

## 2023-10-07 DIAGNOSIS — E785 Hyperlipidemia, unspecified: Secondary | ICD-10-CM

## 2023-10-07 DIAGNOSIS — I739 Peripheral vascular disease, unspecified: Secondary | ICD-10-CM

## 2023-10-07 DIAGNOSIS — I731 Thromboangiitis obliterans [Buerger's disease]: Secondary | ICD-10-CM

## 2023-10-07 DIAGNOSIS — W57XXXA Bitten or stung by nonvenomous insect and other nonvenomous arthropods, initial encounter: Secondary | ICD-10-CM

## 2023-10-07 DIAGNOSIS — G609 Hereditary and idiopathic neuropathy, unspecified: Secondary | ICD-10-CM | POA: Diagnosis not present

## 2023-10-07 DIAGNOSIS — L989 Disorder of the skin and subcutaneous tissue, unspecified: Secondary | ICD-10-CM

## 2023-10-07 DIAGNOSIS — R5383 Other fatigue: Secondary | ICD-10-CM | POA: Diagnosis not present

## 2023-10-07 DIAGNOSIS — R109 Unspecified abdominal pain: Secondary | ICD-10-CM | POA: Diagnosis not present

## 2023-10-07 MED ORDER — NIFEDIPINE ER OSMOTIC RELEASE 30 MG PO TB24
30.0000 mg | ORAL_TABLET | Freq: Every day | ORAL | 1 refills | Status: DC
  Filled 2023-10-07: qty 90, 90d supply, fill #0
  Filled 2023-10-09 (×2): qty 30, 30d supply, fill #0
  Filled 2023-10-14 – 2023-11-04 (×4): qty 30, 30d supply, fill #1
  Filled 2023-11-27 – 2023-12-05 (×2): qty 30, 30d supply, fill #2
  Filled 2023-12-30: qty 30, 30d supply, fill #3

## 2023-10-07 MED ORDER — CILOSTAZOL 50 MG PO TABS
50.0000 mg | ORAL_TABLET | Freq: Two times a day (BID) | ORAL | 4 refills | Status: DC
  Filled 2023-10-07: qty 90, 45d supply, fill #0
  Filled 2023-10-09 (×2): qty 60, 30d supply, fill #0
  Filled 2023-11-04: qty 60, 30d supply, fill #1
  Filled 2023-11-27 – 2023-12-05 (×2): qty 60, 30d supply, fill #2
  Filled 2023-12-30: qty 60, 30d supply, fill #3
  Filled 2024-01-28: qty 180, 90d supply, fill #4

## 2023-10-07 MED ORDER — TIRZEPATIDE-WEIGHT MANAGEMENT 2.5 MG/0.5ML ~~LOC~~ SOAJ
2.5000 mg | SUBCUTANEOUS | 11 refills | Status: DC
  Filled 2023-10-07 – 2023-10-09 (×2): qty 2, 28d supply, fill #0

## 2023-10-07 MED ORDER — PREGABALIN 200 MG PO CAPS
200.0000 mg | ORAL_CAPSULE | Freq: Two times a day (BID) | ORAL | 5 refills | Status: DC
  Filled 2023-10-07 – 2023-10-09 (×3): qty 60, 30d supply, fill #0
  Filled 2023-10-14 – 2023-11-06 (×2): qty 60, 30d supply, fill #1
  Filled 2023-12-18: qty 60, 30d supply, fill #2
  Filled 2024-01-08 – 2024-01-21 (×2): qty 60, 30d supply, fill #3

## 2023-10-07 MED ORDER — PRIMIDONE 50 MG PO TABS
50.0000 mg | ORAL_TABLET | Freq: Two times a day (BID) | ORAL | 3 refills | Status: DC
  Filled 2023-10-07: qty 180, fill #0
  Filled 2023-10-09 (×2): qty 60, 30d supply, fill #0
  Filled 2023-10-14 – 2023-11-04 (×3): qty 60, 30d supply, fill #1
  Filled 2023-11-27 – 2023-12-05 (×2): qty 60, 30d supply, fill #2
  Filled 2023-12-30: qty 60, 30d supply, fill #3
  Filled 2024-01-28: qty 180, 90d supply, fill #4
  Filled 2024-04-13: qty 180, 90d supply, fill #5

## 2023-10-07 MED ORDER — ATORVASTATIN CALCIUM 40 MG PO TABS
40.0000 mg | ORAL_TABLET | Freq: Every day | ORAL | 3 refills | Status: DC
  Filled 2023-10-07: qty 90, 90d supply, fill #0
  Filled 2023-10-09 – 2023-10-14 (×3): qty 30, 30d supply, fill #0
  Filled 2023-10-31 (×2): qty 90, 90d supply, fill #0
  Filled 2023-11-04: qty 30, 30d supply, fill #0
  Filled 2023-11-04: qty 90, 90d supply, fill #0

## 2023-10-07 MED ORDER — NEOMYCIN-POLYMYXIN-HC 3.5-10000-1 OT SOLN
3.0000 [drp] | Freq: Three times a day (TID) | OTIC | 0 refills | Status: AC
  Filled 2023-10-07: qty 10, 22d supply, fill #0

## 2023-10-07 MED ORDER — DICLOFENAC SODIUM 1 % EX GEL
2.0000 g | Freq: Two times a day (BID) | CUTANEOUS | 11 refills | Status: DC
  Filled 2023-10-07: qty 1200, fill #0

## 2023-10-07 MED ORDER — KETOCONAZOLE 2 % EX SHAM
MEDICATED_SHAMPOO | CUTANEOUS | 5 refills | Status: DC
  Filled 2023-10-07: qty 120, fill #0

## 2023-10-07 MED ORDER — CELECOXIB 200 MG PO CAPS
200.0000 mg | ORAL_CAPSULE | Freq: Every day | ORAL | 4 refills | Status: DC
  Filled 2023-10-07: qty 90, 90d supply, fill #0
  Filled 2023-10-09: qty 30, 30d supply, fill #0
  Filled 2023-10-09: qty 90, 90d supply, fill #0
  Filled 2023-10-09: qty 30, 30d supply, fill #0
  Filled 2023-10-14 – 2023-11-04 (×3): qty 30, 30d supply, fill #1

## 2023-10-07 MED ORDER — LIDOCAINE 5 % EX OINT
1.0000 | TOPICAL_OINTMENT | CUTANEOUS | 0 refills | Status: DC | PRN
  Filled 2023-10-07: qty 35.44, fill #0

## 2023-10-07 MED ORDER — ALPHA-LIPOIC ACID 300 MG PO TABS
2.0000 | ORAL_TABLET | Freq: Every day | ORAL | 4 refills | Status: DC
  Filled 2023-10-07 – 2023-11-04 (×2): qty 180, 90d supply, fill #0
  Filled 2023-11-04: qty 60, 30d supply, fill #0

## 2023-10-07 MED ORDER — VITAMIN D-3 125 MCG (5000 UT) PO TABS
1.0000 | ORAL_TABLET | Freq: Every day | ORAL | 4 refills | Status: DC
  Filled 2023-10-07: qty 90, fill #0
  Filled 2023-10-14: qty 30, 30d supply, fill #0
  Filled 2023-10-31: qty 90, 90d supply, fill #0
  Filled 2023-11-04: qty 30, 30d supply, fill #0
  Filled 2023-11-27 – 2023-12-05 (×2): qty 30, 30d supply, fill #1
  Filled 2023-12-30 – 2024-01-01 (×2): qty 30, 30d supply, fill #2
  Filled 2024-01-01: qty 100, 100d supply, fill #2

## 2023-10-07 MED ORDER — FLUTICASONE PROPIONATE 50 MCG/ACT NA SUSP
1.0000 | Freq: Every day | NASAL | 11 refills | Status: DC
  Filled 2023-10-07: qty 18.2, fill #0
  Filled 2024-01-21: qty 16, 60d supply, fill #0
  Filled 2024-03-18: qty 16, 60d supply, fill #1

## 2023-10-07 MED ORDER — TRIAMCINOLONE ACETONIDE 0.1 % EX CREA
1.0000 | TOPICAL_CREAM | Freq: Two times a day (BID) | CUTANEOUS | 0 refills | Status: DC
  Filled 2023-10-07: qty 30, 15d supply, fill #0

## 2023-10-07 MED ORDER — OMEGA-3-ACID ETHYL ESTERS 1 G PO CAPS
2.0000 g | ORAL_CAPSULE | Freq: Two times a day (BID) | ORAL | 3 refills | Status: DC
Start: 2023-10-07 — End: 2024-01-28
  Filled 2023-10-07: qty 180, 45d supply, fill #0
  Filled 2023-10-09 (×2): qty 120, 30d supply, fill #0
  Filled 2023-10-14 – 2023-11-04 (×3): qty 120, 30d supply, fill #1
  Filled 2023-11-27 – 2023-12-05 (×2): qty 120, 30d supply, fill #2
  Filled 2023-12-30: qty 120, 30d supply, fill #3

## 2023-10-07 NOTE — Progress Notes (Unsigned)
 ==============================  [facility]: [id number]   -- Medical Office Visit --  Patient: Stephen Anderson      Age: 83 y.o.       Sex:  male  Date:   10/07/2023 Today's Healthcare Provider: Bernardino KANDICE Cone, MD  ==============================   Chief Complaint: 68month f/u (Pt c/o low energy since having pneumonia.)  HPI: Patient presents reporting just not feeling well severe malaise and fatigue ever since treated for pneumonia last month not really feeling like he got better.  Also he wants refills on all of his medications transferred to different pharmacy so we did a complete medication reconciliation today.  He does not really complain of shortness of breath but when asked about it in detail he says maybe a little bit.  Denies any chest pain or palpitations fevers.  However he reports energy is very low compared to previous.  Also he feels like maybe he has more inflammation or arthritis acting up on his body just does not feel good at all.  In addition to the feeling bad he also notes that there is some feeling of an upset stomach not really pain and does not feel like heartburn but there is in the epigastric area just a feeling like his stomach is upset.  No diarrhea but some constipation and the fatigue is very bad.   Very sensitive right lowerAlso patient is reporting right lateral epicondylar tenderness  Background Reviewed: Problem List: has Essential tremor; Idiopathic peripheral neuropathy; OSA on CPAP; GERD (gastroesophageal reflux disease); Mixed hyperlipidemia; Osteoarthritis, multiple sites; History of melanoma; Insomnia; B12 deficiency; RLS (restless legs syndrome); Chronic fatigue; Right knee pain; Pain in joint of right shoulder; Chest discomfort; Former smoker; High risk medication use; Kidney cyst, acquired; Fatty liver; Thromboangiitis obliterans (Buerger's disease) (HCC); RUQ abdominal pain; 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, not believe to be due to lupus or systemic sclerosis; Diverticular disease; Right inguinal hernia; Aortic atherosclerosis (HCC); Bronchiectasis (HCC); Recurrent kidney stones; Calcification of prostate; DDD (degenerative disc disease), lumbar; LVH (left ventricular hypertrophy); Grade I diastolic dysfunction; Weight loss; Gastritis; Hiatal hernia; Lumbar back pain; Arthritis of both hands; Trigger finger, right ring finger; Chronic kidney disease, stage 2 (mild); Prediabetes; 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 Past Surgical History:   has a past surgical history that includes Gallbladder surgery; Hernia repair; Cataract extraction, bilateral; and Melanoma excision. Social History:   reports that he quit smoking about 15 years ago. His smoking use included cigarettes. He started smoking about 65 years ago. He has a 50 pack-year smoking history. He has never been exposed to tobacco smoke. He quit smokeless tobacco use about 21 years ago. He reports that he does not drink alcohol and does not use drugs. Family History:  family history includes Bladder Cancer in his brother; Heart disease in his brother; Lung cancer in his father;  Neuropathy in his daughter. Allergies:  is allergic to  hydrocodone, oxycodone, oxycontin [oxycodone hcl], and simvastatin.   Medication Reconciliation: Current Outpatient Medications on File Prior to Visit  Medication Sig   Acetaminophen (TYLENOL ARTHRITIS EXT RELIEF PO) Take 1 tablet by mouth every other day.   Ascorbic Acid (VITAMIN C PO) Take 500 mg by mouth daily.   aspirin 81 MG tablet Take 81 mg by mouth every other day.    caffeine 200 MG TABS tablet Take 200 mg by mouth in the morning. STAY AWAKE   Cyanocobalamin  (B-12 PO) Take 1 tablet by mouth daily.   NON FORMULARY CPAP   pantoprazole  (PROTONIX ) 40 MG tablet TAKE 1 TABLET BY MOUTH DAILY   No current facility-administered medications on file prior to visit.   Medications Discontinued During This Encounter  Medication Reason   doxycycline  (VIBRA -TABS) 100 MG tablet    ofloxacin  (FLOXIN ) 0.3 % OTIC solution Completed Course   neomycin -polymyxin-hydrocortisone (CORTISPORIN) OTIC solution    tirzepatide  (ZEPBOUND ) 2.5 MG/0.5ML Pen    ketoconazole  (NIZORAL ) 2 % shampoo    Ferrous Sulfate (IRON PO)    MAGNESIUM PO    Cholecalciferol (VITAMIN D -3) 125 MCG (5000 UT) TABS Reorder   celecoxib  (CELEBREX ) 200 MG capsule Reorder   omega-3 acid ethyl esters (LOVAZA ) 1 g capsule Reorder   Alpha-Lipoic Acid 300 MG TABS Reorder   lidocaine  (XYLOCAINE ) 5 % ointment Reorder   primidone  (MYSOLINE ) 50 MG tablet Reorder   triamcinolone  cream (KENALOG ) 0.1 % Reorder   atorvastatin  (LIPITOR) 40 MG tablet Reorder   cilostazol  (PLETAL ) 50 MG tablet Reorder   NIFEdipine  (PROCARDIA -XL/NIFEDICAL-XL) 30 MG 24 hr tablet Reorder   diclofenac  Sodium (VOLTAREN ) 1 % GEL Reorder   pregabalin  (LYRICA ) 200 MG capsule Reorder   fluticasone  (FLONASE ) 50 MCG/ACT nasal spray Reorder     Physical Exam:    10/07/2023    2:36 PM 09/11/2023    7:58 AM 09/06/2023    9:31 AM  Vitals with BMI  Height 5' 10.5 5' 10.5   Weight 166 lbs 10 oz 175 lbs 3 oz    BMI 23.56 24.77   Systolic 110 112 860  Diastolic 64 62 67  Pulse 78 68 75  Vital signs reviewed.  Nursing notes reviewed. Weight trend reviewed. Physical Exam General Appearance:  No acute distress appreciable.   Well-groomed, tired-appearing male.  Well proportioned with no abnormal fat distribution.  Good muscle tone. Pulmonary:  Normal work of breathing at rest, no respiratory distress apparent. SpO2: 98 %  Musculoskeletal: All extremities are intact.  Neurological:  Awake, alert, oriented, and engaged.  No obvious focal neurological deficits or cognitive impairments.  Sensorium seems unclouded.   Speech is clear and coherent with logical content. Psychiatric:  Appropriate mood, pleasant and cooperative demeanor, thoughtful and engaged during the exam  Physical Exam There is crackles at the right base but the left lower lung sounds better at this time heart sounds regular rate and rhythm with no murmurs rubs or gallop.  Right lateral epicondylar tenderness   Results:    05/17/2023    8:48 AM 05/01/2023    8:35 AM 03/27/2023    8:50 AM 01/16/2023    8:45 AM  PHQ 2/9 Scores  PHQ - 2 Score 0 0 0 1  PHQ- 9 Score  2 1 4     Results for orders placed or performed in visit on 10/07/23  CBC with Differential/Platelet  Result Value Ref Range   WBC 8.0 4.0 - 10.5 K/uL   RBC 4.90 4.22 - 5.81 Mil/uL  Hemoglobin 14.8 13.0 - 17.0 g/dL   HCT 55.9 60.9 - 47.9 %   MCV 89.9 78.0 - 100.0 fl   MCHC 33.6 30.0 - 36.0 g/dL   RDW 85.8 88.4 - 84.4 %   Platelets 227.0 150.0 - 400.0 K/uL   Neutrophils Relative % 58.8 43.0 - 77.0 %   Lymphocytes Relative 27.5 12.0 - 46.0 %   Monocytes Relative 10.4 3.0 - 12.0 %   Eosinophils Relative 2.2 0.0 - 5.0 %   Basophils Relative 1.1 0.0 - 3.0 %   Neutro Abs 4.7 1.4 - 7.7 K/uL   Lymphs Abs 2.2 0.7 - 4.0 K/uL   Monocytes Absolute 0.8 0.1 - 1.0 K/uL   Eosinophils Absolute 0.2 0.0 - 0.7 K/uL   Basophils Absolute 0.1 0.0 - 0.1 K/uL  Comp Met (CMET)  Result  Value Ref Range   Sodium 142 135 - 145 mEq/L   Potassium 4.1 3.5 - 5.1 mEq/L   Chloride 104 96 - 112 mEq/L   CO2 26 19 - 32 mEq/L   Glucose, Bld 94 70 - 99 mg/dL   BUN 31 (H) 6 - 23 mg/dL   Creatinine, Ser 8.50 0.40 - 1.50 mg/dL   Total Bilirubin 0.5 0.2 - 1.2 mg/dL   Alkaline Phosphatase 82 39 - 117 U/L   AST 26 0 - 37 U/L   ALT 40 0 - 53 U/L   Total Protein 7.1 6.0 - 8.3 g/dL   Albumin 4.6 3.5 - 5.2 g/dL   GFR 56.19 (L) >39.99 mL/min   Calcium  9.4 8.4 - 10.5 mg/dL  Sedimentation rate  Result Value Ref Range   Sed Rate 7 0 - 20 mm/hr  B Nat Peptide  Result Value Ref Range   Pro B Natriuretic peptide (BNP) 12.0 0.0 - 100.0 pg/mL  B12 and Folate Panel  Result Value Ref Range   Vitamin B-12 1,138 (H) 211 - 911 pg/mL   Folate >23.2 >5.9 ng/mL  Rheumatoid Factor  Result Value Ref Range   Rheumatoid fact SerPl-aCnc <10 <14 IU/mL   Office Visit on 10/07/2023  Component Date Value Ref Range Status   WBC 10/07/2023 8.0  4.0 - 10.5 K/uL Final   RBC 10/07/2023 4.90  4.22 - 5.81 Mil/uL Final   Hemoglobin 10/07/2023 14.8  13.0 - 17.0 g/dL Final   HCT 93/69/7974 44.0  39.0 - 52.0 % Final   MCV 10/07/2023 89.9  78.0 - 100.0 fl Final   MCHC 10/07/2023 33.6  30.0 - 36.0 g/dL Final   RDW 93/69/7974 14.1  11.5 - 15.5 % Final   Platelets 10/07/2023 227.0  150.0 - 400.0 K/uL Final   Neutrophils Relative % 10/07/2023 58.8  43.0 - 77.0 % Final   Lymphocytes Relative 10/07/2023 27.5  12.0 - 46.0 % Final   Monocytes Relative 10/07/2023 10.4  3.0 - 12.0 % Final   Eosinophils Relative 10/07/2023 2.2  0.0 - 5.0 % Final   Basophils Relative 10/07/2023 1.1  0.0 - 3.0 % Final   Neutro Abs 10/07/2023 4.7  1.4 - 7.7 K/uL Final   Lymphs Abs 10/07/2023 2.2  0.7 - 4.0 K/uL Final   Monocytes Absolute 10/07/2023 0.8  0.1 - 1.0 K/uL Final   Eosinophils Absolute 10/07/2023 0.2  0.0 - 0.7 K/uL Final   Basophils Absolute 10/07/2023 0.1  0.0 - 0.1 K/uL Final   Sodium 10/07/2023 142  135 - 145 mEq/L Final    Potassium 10/07/2023 4.1  3.5 - 5.1 mEq/L Final   Chloride  10/07/2023 104  96 - 112 mEq/L Final   CO2 10/07/2023 26  19 - 32 mEq/L Final   Glucose, Bld 10/07/2023 94  70 - 99 mg/dL Final   BUN 93/69/7974 31 (H)  6 - 23 mg/dL Final   Creatinine, Ser 10/07/2023 1.49  0.40 - 1.50 mg/dL Final   Total Bilirubin 10/07/2023 0.5  0.2 - 1.2 mg/dL Final   Alkaline Phosphatase 10/07/2023 82  39 - 117 U/L Final   AST 10/07/2023 26  0 - 37 U/L Final   ALT 10/07/2023 40  0 - 53 U/L Final   Total Protein 10/07/2023 7.1  6.0 - 8.3 g/dL Final   Albumin 93/69/7974 4.6  3.5 - 5.2 g/dL Final   GFR 93/69/7974 43.80 (L)  >60.00 mL/min Final   Calcium  10/07/2023 9.4  8.4 - 10.5 mg/dL Final   Sed Rate 93/69/7974 7  0 - 20 mm/hr Final   Pro B Natriuretic peptide (BNP) 10/07/2023 12.0  0.0 - 100.0 pg/mL Final   Vitamin B-12 10/07/2023 1,138 (H)  211 - 911 pg/mL Final   Folate 10/07/2023 >23.2  >5.9 ng/mL Final   Rheumatoid fact SerPl-aCnc 10/07/2023 <10  <14 IU/mL Final  Office Visit on 08/21/2023  Component Date Value Ref Range Status   MICRO NUMBER: 08/21/2023 [id number]   Final   SPECIMEN QUALITY: 08/21/2023 Adequate   Final   Source: 08/21/2023 NOT GIVEN   Final   STATUS: 08/21/2023 FINAL   Final   GRAM STAIN: 08/21/2023 No epithelial cells seen No white blood cells seen No organisms seen   Final   ANA RESULT: 08/21/2023 No anaerobes isolated.   Final   MICRO NUMBER: 08/21/2023 [id number]   Final   SPECIMEN QUALITY: 08/21/2023 Adequate   Final   SOURCE: 08/21/2023 NOT GIVEN   Final   STATUS: 08/21/2023 FINAL   Final   AER RESULT: 08/21/2023 No Growth   Final   COMMENT: 08/21/2023 No source was provided. The specimen was tested and reported based upon the test code ordered. If this is incorrect, please contact client services.   Final  Office Visit on 08/14/2023  Component Date Value Ref Range Status   WBC 08/14/2023 11.4 (H)  4.0 - 10.5 K/uL Final   RBC 08/14/2023 4.23  4.22 - 5.81 Mil/uL Final    Hemoglobin 08/14/2023 13.0  13.0 - 17.0 g/dL Final   HCT 94/92/7974 38.3 (L)  39.0 - 52.0 % Final   MCV 08/14/2023 90.5  78.0 - 100.0 fl Final   MCHC 08/14/2023 33.9  30.0 - 36.0 g/dL Final   RDW 94/92/7974 13.9  11.5 - 15.5 % Final   Platelets 08/14/2023 228.0  150.0 - 400.0 K/uL Final   Neutrophils Relative % 08/14/2023 79.4 (H)  43.0 - 77.0 % Final   Lymphocytes Relative 08/14/2023 13.4  12.0 - 46.0 % Final   Monocytes Relative 08/14/2023 6.2  3.0 - 12.0 % Final   Eosinophils Relative 08/14/2023 0.2  0.0 - 5.0 % Final   Basophils Relative 08/14/2023 0.8  0.0 - 3.0 % Final   Neutro Abs 08/14/2023 9.0 (H)  1.4 - 7.7 K/uL Final   Lymphs Abs 08/14/2023 1.5  0.7 - 4.0 K/uL Final   Monocytes Absolute 08/14/2023 0.7  0.1 - 1.0 K/uL Final   Eosinophils Absolute 08/14/2023 0.0  0.0 - 0.7 K/uL Final   Basophils Absolute 08/14/2023 0.1  0.0 - 0.1 K/uL Final   Sodium 08/14/2023 137  135 - 145 mEq/L Final   Potassium 08/14/2023  4.8  3.5 - 5.1 mEq/L Final   Chloride 08/14/2023 105  96 - 112 mEq/L Final   CO2 08/14/2023 24  19 - 32 mEq/L Final   Glucose, Bld 08/14/2023 119 (H)  70 - 99 mg/dL Final   BUN 94/92/7974 27 (H)  6 - 23 mg/dL Final   Creatinine, Ser 08/14/2023 1.11  0.40 - 1.50 mg/dL Final   Total Bilirubin 08/14/2023 0.2  0.2 - 1.2 mg/dL Final   Alkaline Phosphatase 08/14/2023 74  39 - 117 U/L Final   AST 08/14/2023 28  0 - 37 U/L Final   ALT 08/14/2023 38  0 - 53 U/L Final   Total Protein 08/14/2023 7.0  6.0 - 8.3 g/dL Final   Albumin 94/92/7974 3.7  3.5 - 5.2 g/dL Final   GFR 94/92/7974 62.43  >60.00 mL/min Final   Calcium  08/14/2023 8.7  8.4 - 10.5 mg/dL Final   Pro B Natriuretic peptide (BNP) 08/14/2023 24.0  0.0 - 100.0 pg/mL Final   CRP 08/14/2023 4.0  0.5 - 20.0 mg/dL Final   Sed Rate 94/92/7974 44 (H)  0 - 20 mm/hr Final   Vitamin B-12 08/14/2023 634  211 - 911 pg/mL Final   Folate 08/14/2023 13.1  >5.9 ng/mL Final   SARS Coronavirus 2 Ag 08/14/2023 Negative  Negative Final   Office Visit on 07/31/2023  Component Date Value Ref Range Status   WBC 07/31/2023 7.0  4.0 - 10.5 K/uL Final   RBC 07/31/2023 4.75  4.22 - 5.81 Mil/uL Final   Hemoglobin 07/31/2023 14.5  13.0 - 17.0 g/dL Final   HCT 95/76/7974 43.5  39.0 - 52.0 % Final   MCV 07/31/2023 91.7  78.0 - 100.0 fl Final   MCHC 07/31/2023 33.3  30.0 - 36.0 g/dL Final   RDW 95/76/7974 14.0  11.5 - 15.5 % Final   Platelets 07/31/2023 167.0  150.0 - 400.0 K/uL Final   Neutrophils Relative % 07/31/2023 64.7  43.0 - 77.0 % Final   Lymphocytes Relative 07/31/2023 24.9  12.0 - 46.0 % Final   Monocytes Relative 07/31/2023 7.5  3.0 - 12.0 % Final   Eosinophils Relative 07/31/2023 2.3  0.0 - 5.0 % Final   Basophils Relative 07/31/2023 0.6  0.0 - 3.0 % Final   Neutro Abs 07/31/2023 4.5  1.4 - 7.7 K/uL Final   Lymphs Abs 07/31/2023 1.7  0.7 - 4.0 K/uL Final   Monocytes Absolute 07/31/2023 0.5  0.1 - 1.0 K/uL Final   Eosinophils Absolute 07/31/2023 0.2  0.0 - 0.7 K/uL Final   Basophils Absolute 07/31/2023 0.0  0.0 - 0.1 K/uL Final   Sodium 07/31/2023 138  135 - 145 mEq/L Final   Potassium 07/31/2023 4.2  3.5 - 5.1 mEq/L Final   Chloride 07/31/2023 104  96 - 112 mEq/L Final   CO2 07/31/2023 28  19 - 32 mEq/L Final   Glucose, Bld 07/31/2023 101 (H)  70 - 99 mg/dL Final   BUN 95/76/7974 27 (H)  6 - 23 mg/dL Final   Creatinine, Ser 07/31/2023 1.18  0.40 - 1.50 mg/dL Final   Total Bilirubin 07/31/2023 0.6  0.2 - 1.2 mg/dL Final   Alkaline Phosphatase 07/31/2023 59  39 - 117 U/L Final   AST 07/31/2023 16  0 - 37 U/L Final   ALT 07/31/2023 14  0 - 53 U/L Final   Total Protein 07/31/2023 6.7  6.0 - 8.3 g/dL Final   Albumin 95/76/7974 4.4  3.5 - 5.2 g/dL Final  GFR 07/31/2023 58.03 (L)  >60.00 mL/min Final   Calcium  07/31/2023 8.8  8.4 - 10.5 mg/dL Final   Phosphorus 95/76/7974 2.6  2.3 - 4.6 mg/dL Final   VITD 95/76/7974 47.95  30.00 - 100.00 ng/mL Final   PTH 07/31/2023 49  16 - 77 pg/mL Final   Uric Acid, Serum  07/31/2023 5.8  4.0 - 7.8 mg/dL Final   Color, Urine 95/76/7974 YELLOW  YELLOW Final   APPearance 07/31/2023 CLEAR  CLEAR Final   Specific Gravity, Urine 07/31/2023 1.019  1.001 - 1.035 Final   pH 07/31/2023 6.0  5.0 - 8.0 Final   Glucose, UA 07/31/2023 NEGATIVE  NEGATIVE Final   Bilirubin Urine 07/31/2023 NEGATIVE  NEGATIVE Final   Ketones, ur 07/31/2023 NEGATIVE  NEGATIVE Final   Hgb urine dipstick 07/31/2023 NEGATIVE  NEGATIVE Final   Protein, ur 07/31/2023 NEGATIVE  NEGATIVE Final   Nitrites, Initial 07/31/2023 NEGATIVE  NEGATIVE Final   Leukocyte Esterase 07/31/2023 NEGATIVE  NEGATIVE Final   WBC, UA 07/31/2023 NONE SEEN  0 - 5 /HPF Final   RBC / HPF 07/31/2023 NONE SEEN  0 - 2 /HPF Final   Squamous Epithelial / HPF 07/31/2023 NONE SEEN  < OR = 5 /HPF Final   Bacteria, UA 07/31/2023 NONE SEEN  NONE SEEN /HPF Final   Hyaline Cast 07/31/2023 NONE SEEN  NONE SEEN /LPF Final   Note 07/31/2023    Final   Vitamin B-12 07/31/2023 483  211 - 911 pg/mL Final   Methylmalonic Acid, Quant 07/31/2023 106  85 - 423 nmol/L Final   Ceruloplasmin 07/31/2023 20  14 - 30 mg/dL Final   Zinc  07/31/2023 60  60 - 130 mcg/dL Final   Total Protein 95/76/7974 6.4  6.1 - 8.1 g/dL Final   Albumin ELP 95/76/7974 4.1  3.8 - 4.8 g/dL Final   Alpha 1 95/76/7974 0.2  0.2 - 0.3 g/dL Final   Alpha 2 95/76/7974 0.7  0.5 - 0.9 g/dL Final   Beta Globulin 95/76/7974 0.4  0.4 - 0.6 g/dL Final   Beta 2 95/76/7974 0.3  0.2 - 0.5 g/dL Final   Gamma Globulin 07/31/2023 0.7 (L)  0.8 - 1.7 g/dL Final   Abnormal Protein Band1 07/31/2023   NONE DETECTED g/dL Final   SPE Interp. 95/76/7974    Final   Protein, Ur 08/02/2023 9.3  Not Estab. mg/dL Final   Protein, 75Y Urine 08/02/2023 Comment  30 - 150 mg/24 hr Final   ALBUMIN, U 08/02/2023 100.0  % Final   ALPHA 1 URINE 08/02/2023 0.0  % Final   ALPHA-2-GLOBULIN, U 08/02/2023 0.0  % Final   % BETA, Urine 08/02/2023 0.0  % Final   GAMMA GLOBULIN URINE 08/02/2023 0.0  % Final    M-SPIKE, % 08/02/2023 Not Observed  Not Observed % Final   M-Spike, mg/24 hr 08/02/2023 Comment  Not Observed mg/24 hr Final   Immunofixation, Urine 08/02/2023 Comment   Final   NOTE: 08/02/2023 Comment   Final   Free Kappa Lt Chains,Ur 08/02/2023 45.86  1.17 - 86.46 mg/L Final   Free Lambda Lt Chains,Ur 08/02/2023 8.15  0.27 - 15.21 mg/L Final   Kappa/Lambda Ratio,U 08/02/2023 5.63  1.83 - 14.26 Final   ANCA SCREEN 07/31/2023 Negative  Negative Final   Sed Rate 07/31/2023 4  0 - 20 mm/hr Final   CRP 07/31/2023 <1.0  0.5 - 20.0 mg/dL Final   Reflexve Urine Culture 07/31/2023    Final  Office Visit on 03/27/2023  Component Date Value Ref Range Status   Vitamin B-12 03/27/2023 1,492 (H)  211 - 911 pg/mL Final   Folate 03/27/2023 16.0  >5.9 ng/mL Final   Hgb A1c MFr Bld 03/27/2023 5.9  4.6 - 6.5 % Final   Ferritin 03/27/2023 53.8  22.0 - 322.0 ng/mL Final   Sodium 03/27/2023 142  135 - 145 mEq/L Final   Potassium 03/27/2023 4.3  3.5 - 5.1 mEq/L Final   Chloride 03/27/2023 105  96 - 112 mEq/L Final   CO2 03/27/2023 30  19 - 32 mEq/L Final   Glucose, Bld 03/27/2023 109 (H)  70 - 99 mg/dL Final   BUN 87/81/7975 21  6 - 23 mg/dL Final   Creatinine, Ser 03/27/2023 1.03  0.40 - 1.50 mg/dL Final   Total Bilirubin 03/27/2023 0.5  0.2 - 1.2 mg/dL Final   Alkaline Phosphatase 03/27/2023 68  39 - 117 U/L Final   AST 03/27/2023 17  0 - 37 U/L Final   ALT 03/27/2023 14  0 - 53 U/L Final   Total Protein 03/27/2023 6.8  6.0 - 8.3 g/dL Final   Albumin 87/81/7975 4.5  3.5 - 5.2 g/dL Final   GFR 87/81/7975 68.48  >60.00 mL/min Final   Calcium  03/27/2023 8.9  8.4 - 10.5 mg/dL Final   WBC 87/81/7975 6.3  4.0 - 10.5 K/uL Final   RBC 03/27/2023 4.74  4.22 - 5.81 Mil/uL Final   Hemoglobin 03/27/2023 14.6  13.0 - 17.0 g/dL Final   HCT 87/81/7975 43.8  39.0 - 52.0 % Final   MCV 03/27/2023 92.3  78.0 - 100.0 fl Final   MCHC 03/27/2023 33.3  30.0 - 36.0 g/dL Final   RDW 87/81/7975 14.0  11.5 - 15.5 %  Final   Platelets 03/27/2023 185.0  150.0 - 400.0 K/uL Final   Neutrophils Relative % 03/27/2023 60.8  43.0 - 77.0 % Final   Lymphocytes Relative 03/27/2023 27.2  12.0 - 46.0 % Final   Monocytes Relative 03/27/2023 9.0  3.0 - 12.0 % Final   Eosinophils Relative 03/27/2023 2.3  0.0 - 5.0 % Final   Basophils Relative 03/27/2023 0.7  0.0 - 3.0 % Final   Neutro Abs 03/27/2023 3.8  1.4 - 7.7 K/uL Final   Lymphs Abs 03/27/2023 1.7  0.7 - 4.0 K/uL Final   Monocytes Absolute 03/27/2023 0.6  0.1 - 1.0 K/uL Final   Eosinophils Absolute 03/27/2023 0.1  0.0 - 0.7 K/uL Final   Basophils Absolute 03/27/2023 0.0  0.0 - 0.1 K/uL Final   Cholesterol 03/27/2023 123  0 - 200 mg/dL Final   Triglycerides 87/81/7975 81.0  0.0 - 149.0 mg/dL Final   HDL 87/81/7975 41.00  >39.00 mg/dL Final   VLDL 87/81/7975 16.2  0.0 - 40.0 mg/dL Final   LDL Cholesterol 03/27/2023 65  0 - 99 mg/dL Final   Total CHOL/HDL Ratio 03/27/2023 3   Final   NonHDL 03/27/2023 81.51   Final   VITD 03/27/2023 34.36  30.00 - 100.00 ng/mL Final   Anti Nuclear Antibody (ANA) 03/27/2023 Negative  Negative Final   Rheumatoid fact SerPl-aCnc 03/27/2023 <10  <14 IU/mL Final   T3 Uptake 03/27/2023 28  22 - 35 % Final   T4, Total 03/27/2023 7.5  4.9 - 10.5 mcg/dL Final   Free Thyroxine Index 03/27/2023 2.1  1.4 - 3.8 Final   TSH 03/27/2023 1.05  0.40 - 4.50 mIU/L Final   Arsenic, 24H Ur 04/04/2023 <10  <=80 mcg/L Final   Lead, 24 hr  urine 04/04/2023 <10  <80 mcg/L Final   Mercury, 24H Ur 04/04/2023 <4  <=20 mcg/L Final  Office Visit on 09/24/2022  Component Date Value Ref Range Status   Rheumatoid fact SerPl-aCnc 09/24/2022 <10  <14 IU/mL Final   Sed Rate 09/24/2022 2  0 - 20 mm/h Final   Scleroderma (Scl-70) (ENA) Antibod* 09/24/2022 <1.0 NEG  <1.0 NEG AI Final   Ribonucleic Protein(ENA) Antibody,* 09/24/2022 <1.0 NEG  <1.0 NEG AI Final   ENA SM Ab Ser-aCnc 09/24/2022 <1.0 NEG  <1.0 NEG AI Final   SSA (Ro) (ENA) Antibody, IgG 09/24/2022  <1.0 NEG  <1.0 NEG AI Final   ds DNA Ab 09/24/2022 1  IU/mL Final   C3 Complement 09/24/2022 124  82 - 185 mg/dL Final   C4 Complement 93/82/7975 21  15 - 53 mg/dL Final  Office Visit on 09/05/2022  Component Date Value Ref Range Status   WBC 09/05/2022 6.9  4.0 - 10.5 K/uL Final   RBC 09/05/2022 4.71  4.22 - 5.81 Mil/uL Final   Hemoglobin 09/05/2022 14.1  13.0 - 17.0 g/dL Final   HCT 94/70/7975 42.4  39.0 - 52.0 % Final   MCV 09/05/2022 90.2  78.0 - 100.0 fl Final   MCHC 09/05/2022 33.2  30.0 - 36.0 g/dL Final   RDW 94/70/7975 14.9  11.5 - 15.5 % Final   Platelets 09/05/2022 158.0  150.0 - 400.0 K/uL Final   Neutrophils Relative % 09/05/2022 62.2  43.0 - 77.0 % Final   Lymphocytes Relative 09/05/2022 25.1  12.0 - 46.0 % Final   Monocytes Relative 09/05/2022 9.9  3.0 - 12.0 % Final   Eosinophils Relative 09/05/2022 2.2  0.0 - 5.0 % Final   Basophils Relative 09/05/2022 0.6  0.0 - 3.0 % Final   Neutro Abs 09/05/2022 4.3  1.4 - 7.7 K/uL Final   Lymphs Abs 09/05/2022 1.7  0.7 - 4.0 K/uL Final   Monocytes Absolute 09/05/2022 0.7  0.1 - 1.0 K/uL Final   Eosinophils Absolute 09/05/2022 0.2  0.0 - 0.7 K/uL Final   Basophils Absolute 09/05/2022 0.0  0.0 - 0.1 K/uL Final   Ferritin 09/05/2022 29.4  22.0 - 322.0 ng/mL Final   Cholesterol 09/05/2022 123  0 - 200 mg/dL Final   Triglycerides 94/70/7975 160.0 (H)  0.0 - 149.0 mg/dL Final   HDL 94/70/7975 34.60 (L)  >60.99 mg/dL Final   VLDL 94/70/7975 32.0  0.0 - 40.0 mg/dL Final   LDL Cholesterol 09/05/2022 57  0 - 99 mg/dL Final   Total CHOL/HDL Ratio 09/05/2022 4   Final   NonHDL 09/05/2022 88.83   Final   Sodium 09/05/2022 142  135 - 145 mEq/L Final   Potassium 09/05/2022 4.4  3.5 - 5.1 mEq/L Final   Chloride 09/05/2022 107  96 - 112 mEq/L Final   CO2 09/05/2022 28  19 - 32 mEq/L Final   Glucose, Bld 09/05/2022 101 (H)  70 - 99 mg/dL Final   BUN 94/70/7975 19  6 - 23 mg/dL Final   Creatinine, Ser 09/05/2022 1.17  0.40 - 1.50 mg/dL Final    Total Bilirubin 09/05/2022 0.4  0.2 - 1.2 mg/dL Final   Alkaline Phosphatase 09/05/2022 73  39 - 117 U/L Final   AST 09/05/2022 18  0 - 37 U/L Final   ALT 09/05/2022 15  0 - 53 U/L Final   Total Protein 09/05/2022 6.5  6.0 - 8.3 g/dL Final   Albumin 94/70/7975 4.0  3.5 - 5.2 g/dL Final  GFR 09/05/2022 59.00 (L)  >60.00 mL/min Final   Calcium  09/05/2022 8.6  8.4 - 10.5 mg/dL Final   VITD 94/70/7975 46.57  30.00 - 100.00 ng/mL Final   Magnesium 09/05/2022 1.9  1.5 - 2.5 mg/dL Final   Phosphorus 94/70/7975 2.1 (L)  2.3 - 4.6 mg/dL Final   Uric Acid, Serum 09/05/2022 5.7  4.0 - 7.8 mg/dL Final  Scanned Document on 06/20/2022  Component Date Value Ref Range Status   [facility] Colonoscopy 06/20/2022 See Report (in chart)  See Report (in chart), Patient Reported Final  Appointment on 06/04/2022  Component Date Value Ref Range Status   Weight 06/04/2022 2,848  oz Final   Height 06/04/2022 70  in Final   BP 06/04/2022 122/70  mmHg Final   S' Lateral 06/04/2022 2.15  cm Final   Area-P 1/2 06/04/2022 3.77  cm2 Final   MV M vel 06/04/2022 3.23  m/s Final   MV Peak grad 06/04/2022 41.6  mmHg Final   Est EF 06/04/2022 55 - 60%   Final  Hospital Outpatient Visit on 05/20/2022  Component Date Value Ref Range Status   Creatinine, Ser 05/20/2022 1.30 (H)  0.61 - 1.24 mg/dL Final  There may be more visits with results that are not included.  No image results found. DG Chest 2 View Result Date: 10/08/2023 CLINICAL DATA:  History of pneumonia, worsening fatigue EXAM: CHEST - 2 VIEW COMPARISON:  Chest x-ray Aug 14, 2023 FINDINGS: The cardiomediastinal silhouette is unchanged in contour. Persistent left lower lobe opacity, decreased in conspicuity compared to Aug 14, 2023. No new focal pulmonary opacity. No pleural effusion or pneumothorax. The visualized upper abdomen is unremarkable. No acute osseous abnormality. IMPRESSION: Persistent but decreased left lower lobe opacity. Differential considerations include  atelectasis versus resolving pneumonia. Electronically Signed   By: Dirk Arrant M.D.   On: 10/08/2023 13:25   DG Chest 2 View Result Date: 08/16/2023 CLINICAL DATA:  Chest congestion.  Short of breath. EXAM: CHEST - 2 VIEW COMPARISON:  01/14/2019.  CT, 05/02/2022. FINDINGS: Cardiac silhouette is normal in size. No mediastinal or hilar masses. No evidence of adenopathy. Lungs are hyperexpanded. There is opacity in the retrocardiac region of the left lower lobe. Minor linear scarring is noted lateral to this, which is stable. Remainder of the lungs is clear. Relative lucency in the upper lobes suggests emphysema. No pleural effusion or pneumothorax. Skeletal structures are intact. IMPRESSION: 1. Left lower lobe opacity consistent with pneumonia or atelectasis. 2. No other evidence of acute cardiopulmonary disease. 3. Emphysema. Electronically Signed   By: Alm Parkins M.D.   On: 08/16/2023 10:39         ASSESSMENT & PLAN   Assessment & Plan Chronic fatigue Suspicious for inflammatory cause possibly persistent pneumonia we will do blood work and chest x-ray Shortness of breath Mild but will get XR.   Upset stomach Will get abdominal x-ray question possible stress ulcer Idiopathic peripheral neuropathy This is a chronic problem that we have never been able to narrow down the cause of we will transfer medications as requested Primary osteoarthritis involving multiple joints There might be a compounding inflammatory arthritis going on currently we will do some additional testing and transfer medications Vitamin D  deficiency Check labs Thromboangiitis obliterans (Buerger's disease) (HCC) Refilled and transferred medication(s) per patient request  Chronic kidney disease, stage 2 (mild) Refilled and transferred medication(s) per patient request  Small fiber neuropathy Refilled and transferred medication(s) per patient request  Seasonal allergic rhinitis, unspecified trigger  Refilled and  transferred medication(s) per patient request  Other seborrheic dermatitis Refilled and transferred medication(s) per patient request  Injury of external auditory canal, initial encounter Refilled and transferred medication(s)- ?if  related with fatigue RUQ abdominal pain Refilled and transferred medication(s) per patient request  PAD (peripheral artery disease) (HCC) Refilled and transferred medication(s) per patient request  Raynaud's disease without gangrene Refilled and transferred medication(s) per patient request  Aortic atherosclerosis (HCC) Refilled and transferred medication(s) per patient request  Dyslipidemia Refilled and transferred medication(s) per patient request  Hypertriglyceridemia Refilled and transferred medication(s) per patient request  Essential tremor Refilled and transferred medication(s) per patient request  OSA on CPAP Refilled and transferred medication(s) per patient request  Cracking skin Refilled and transferred medication(s) per patient request  Constipation, unspecified constipation type Refilled and transferred medication(s) per patient request  Lateral epicondylitis, right elbow Per patient report   Assessment and Plan Assessment & Plan      ORDER ASSOCIATIONS  #   DIAGNOSIS / CONDITION ICD-10 ENCOUNTER ORDER     ICD-10-CM   1. Chronic fatigue  R53.82 DG Chest 2 View    CBC with Differential/Platelet    Comp Met (CMET)    Sedimentation rate    Troponin I (High Sensitivity)    B Nat Peptide    B12 and Folate Panel    Rheumatoid Factor    Antinuclear Antib (ANA)    2. Upset stomach  K30 DG Abd 2 Views    3. Idiopathic peripheral neuropathy  G60.9 Alpha-Lipoic Acid 300 MG TABS    diclofenac  Sodium (VOLTAREN ) 1 % GEL    lidocaine  (XYLOCAINE ) 5 % ointment    pregabalin  (LYRICA ) 200 MG capsule    4. Primary osteoarthritis involving multiple joints  M15.0 celecoxib  (CELEBREX ) 200 MG capsule    diclofenac  Sodium (VOLTAREN ) 1 % GEL     B Nat Peptide    B12 and Folate Panel    Rheumatoid Factor    Antinuclear Antib (ANA)    5. Vitamin D  deficiency  E55.9 Cholecalciferol (VITAMIN D -3) 125 MCG (5000 UT) TABS    6. Thromboangiitis obliterans (Buerger's disease) (HCC)  I73.1 cilostazol  (PLETAL ) 50 MG tablet    NIFEdipine  (PROCARDIA -XL/NIFEDICAL-XL) 30 MG 24 hr tablet    7. Chronic kidney disease, stage 2 (mild)  N18.2 diclofenac  Sodium (VOLTAREN ) 1 % GEL    pregabalin  (LYRICA ) 200 MG capsule    8. Small fiber neuropathy  G62.9 diclofenac  Sodium (VOLTAREN ) 1 % GEL    pregabalin  (LYRICA ) 200 MG capsule    9. 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 Seasonal allergic rhinitis, unspecified trigger  J30.2 fluticasone  (FLONASE ) 50 MCG/ACT nasal spray    12. Other seborrheic dermatitis  L21.8 ketoconazole  (NIZORAL ) 2 % shampoo    13. Injury of external auditory canal, initial encounter  S09.91XA neomycin -polymyxin-hydrocortisone (CORTISPORIN) OTIC solution    14. RUQ abdominal pain  R10.11 NIFEdipine  (PROCARDIA -XL/NIFEDICAL-XL) 30 MG 24 hr tablet    15. PAD (peripheral artery disease) (HCC)  I73.9 NIFEdipine  (PROCARDIA -XL/NIFEDICAL-XL) 30 MG 24 hr tablet    16. Raynaud's disease without gangrene  I73.00 NIFEdipine  (PROCARDIA -XL/NIFEDICAL-XL) 30 MG 24 hr tablet    B Nat Peptide    B12 and Folate Panel    Rheumatoid Factor    Antinuclear Antib (ANA)    17. Aortic atherosclerosis (HCC)  I70.0 atorvastatin  (LIPITOR) 40 MG tablet  omega-3 acid ethyl esters (LOVAZA ) 1 g capsule    18. Dyslipidemia  E78.5 omega-3 acid ethyl esters (LOVAZA ) 1 g capsule    19. Hypertriglyceridemia  E78.1 omega-3 acid ethyl esters (LOVAZA ) 1 g capsule    20. Essential tremor  G25.0 primidone  (MYSOLINE ) 50 MG tablet    21. OSA on CPAP  G47.33 tirzepatide  (ZEPBOUND ) 2.5 MG/0.5ML Pen    22. Cracking skin  L98.9 triamcinolone  cream (KENALOG ) 0.1 %    23.  Constipation, unspecified constipation type  K59.00 DG Abd 2 Views    24. Shortness of breath  R06.02 DG Chest 2 View    25. Lateral epicondylitis, right elbow  M77.11 Rheumatoid Factor    Antinuclear Antib (ANA)     \ Meds ordered this encounter  Medications   Alpha-Lipoic Acid 300 MG TABS    Sig: Take 2 tablets (600 mg total) by mouth daily at 6 (six) AM.    Dispense:  180 tablet    Refill:  4    Patient transferring all medications from [facility], would like pill packs.   atorvastatin  (LIPITOR) 40 MG tablet    Sig: Take 1 tablet (40 mg total) by mouth at bedtime.    Dispense:  90 tablet    Refill:  3    Patient transferring all medications from [facility], would like pill packs.   celecoxib  (CELEBREX ) 200 MG capsule    Sig: Take 1 capsule (200 mg total) by mouth daily.    Dispense:  90 capsule    Refill:  4    Patient transferring all medications from [facility], would like pill packs.   Cholecalciferol (VITAMIN D -3) 125 MCG (5000 UT) TABS    Sig: Take 1 tablet by mouth daily.    Dispense:  90 tablet    Refill:  4    Patient transferring all medications from [facility], would like pill packs.   cilostazol  (PLETAL ) 50 MG tablet    Sig: Take 1 tablet (50 mg total) by mouth 2 (two) times daily.    Dispense:  90 tablet    Refill:  4    Patient transferring all medications from [facility], would like pill packs.   diclofenac  Sodium (VOLTAREN ) 1 % GEL    Sig: Apply 2 g topically in the morning and at bedtime.    Dispense:  1200 g    Refill:  11    Patient transferring all medications from [facility], would like pill packs.   fluticasone  (FLONASE ) 50 MCG/ACT nasal spray    Sig: Place 1 spray into both nostrils daily as needed.    Dispense:  16 g    Refill:  11    Patient transferring all medications from [facility], would like pill packs.   ketoconazole  (NIZORAL ) 2 % shampoo    Sig: Apply to the affected area(s) and let sit 3-5 minutes before rinsing.     Dispense:  120 mL    Refill:  5    Patient transferring all medications from [facility], would like pill packs.   lidocaine  (XYLOCAINE ) 5 % ointment    Sig: Apply to the affected area(s) topically as needed.    Dispense:  35.44 g    Refill:  0    Patient transferring all medications from [facility], would like pill packs.   neomycin -polymyxin-hydrocortisone (CORTISPORIN) OTIC solution    Sig: Place 3 drops into the left ear 3 (three) times daily for 3 days.    Dispense:  10 mL    Refill:  0    Patient transferring all medications from [facility], would like pill packs.   NIFEdipine  (PROCARDIA -XL/NIFEDICAL-XL) 30 MG 24 hr tablet    Sig: Take 1 tablet (30 mg total) by mouth daily.    Dispense:  90 tablet    Refill:  1    Patient transferring all medications from [facility], would like pill packs.   omega-3 acid ethyl esters (LOVAZA ) 1 g capsule    Sig: Take 2 capsules (2 g total) by mouth 2 (two) times daily.    Dispense:  180 capsule    Refill:  3    Patient transferring all medications from [facility], would like pill packs.   pregabalin  (LYRICA ) 200 MG capsule    Sig: Take 1 capsule (200 mg total) by mouth 2 (two) times daily.    Dispense:  60 capsule    Refill:  5    Patient transferring all medications from [facility], would like pill packs. Dose increased   primidone  (MYSOLINE ) 50 MG tablet    Sig: Take 1 tablet (50 mg total) by mouth 2 (two) times daily in morning and evening.    Dispense:  180 tablet    Refill:  3    Patient transferring all medications from [facility], would like pill packs.   tirzepatide  (ZEPBOUND ) 2.5 MG/0.5ML Pen    Sig: Inject 2.5 mg into the skin once a week.    Dispense:  2 mL    Refill:  11    Patient transferring all medications from [facility], would like pill packs.   triamcinolone  cream (KENALOG ) 0.1 %    Sig: Apply to the affected area(s) topically 2 (two) times daily. Use for up to 14 days.    Dispense:  30 g     Refill:  0    Patient transferring all medications from [facility], would like pill packs.    Orders Placed This Encounter  Procedures   DG Chest 2 View    Standing Status:   Future    Number of Occurrences:   1    Expiration Date:   04/07/2024    Reason for Exam (SYMPTOM  OR DIAGNOSIS REQUIRED):   history of pneumonia, worsening fatigue    Preferred imaging location?:   [facility]   DG Abd 2 Views    Standing Status:   Future    Number of Occurrences:   1    Expiration Date:   10/06/2024    Reason for Exam (SYMPTOM  OR DIAGNOSIS REQUIRED):   constipation and abd discomfort.    Preferred imaging location?:   [facility]   CBC with Differential/Platelet   Comp Met (CMET)   Sedimentation rate   B Nat Peptide   B12 and Folate Panel   Rheumatoid Factor   Antinuclear Antib (ANA)        This document was synthesized by artificial intelligence (Abridge) using HIPAA-compliant recording of the clinical interaction;   We discussed the use of AI scribe software for clinical note transcription with the patient, who gave verbal consent to proceed. additional Info: This encounter employed state-of-the-art, real-time, collaborative documentation. The patient actively reviewed and assisted in updating their electronic medical record on a shared screen, ensuring transparency and facilitating joint problem-solving for the problem list, overview, and plan. This approach promotes accurate, informed care. The treatment plan was discussed and reviewed in detail, including medication safety, potential side effects,  and all patient questions. We confirmed understanding and comfort with the plan. Follow-up instructions were established, including contacting the office for any concerns, returning if symptoms worsen, persist, or new symptoms develop, and precautions for potential emergency department visits.

## 2023-10-07 NOTE — Patient Instructions (Signed)
 Team Member Role and Visual merchandiser Info Address Start End Comments  Lonni Slain, MD Cardiology (Cardiology) Phone: [phone] Fax: [phone] Email: [email or web address] [address] [place] [address] 04/18/2022 - -   Check in with cardiologist.   It was a pleasure seeing you today! Your health and satisfaction are our top priorities.  Bernardino Cone, MD  Your Providers PCP: Cone Bernardino MATSU, MD,  [phone]) Referring Provider: Cone Bernardino MATSU, MD,  [phone]) Care Team Provider: Evonnie Asberry RAMAN, DO,  [phone]) Care Team Provider: Neysa Reggy BIRCH, MD,  [phone]) Care Team Provider: Joshua Barters Care Team Provider: Gerome Charleston, MD,  [phone]) Care Team Provider: Donnald Charleston, MD,  [phone]) Care Team Provider: Leslee Reusing, MD,  [phone]) Care Team Provider: Leslee Reusing, MD,  [phone]) Care Team Provider: Livingston Rigg, MD Care Team Provider: Porter Andrez SAUNDERS, PA-C Care Team Provider: Lonni Slain, MD,  [phone]) Care Team Provider: Dianna Specking, MD,  [phone]) Care Team Provider: Jeannetta Lonni ORN, MD,  [phone])     NEXT STEPS: [x]  Early Intervention: Schedule sooner appointment, call our on-call services, or go to emergency room if there is any significant Increase in pain or discomfort New or worsening symptoms Sudden or severe changes in your health [x]  Flexible Follow-Up: We recommend a No follow-ups on file. for optimal routine care. This allows for progress monitoring and treatment adjustments. [x]  Preventive Care: Schedule your annual preventive care visit! It's typically covered by insurance and helps identify potential health issues early. [x]  Lab & X-ray Appointments: Incomplete tests scheduled today, or call to schedule. X-rays: [facility] (M-F, 8:30am-noon or 1pm-5pm). [x]  Medical Information Release: Sign a  release form at front desk to obtain relevant medical information we don't have.  MAKING THE MOST OF OUR FOCUSED 20 MINUTE APPOINTMENTS: [x]   Clearly state your top concerns at the beginning of the visit to focus our discussion [x]   If you anticipate you will need more time, please inform the front desk during scheduling - we can book multiple appointments in the same week. [x]   If you have transportation problems- use our convenient video appointments or ask about transportation support. [x]   We can get down to business faster if you use MyChart to update information before the visit and submit non-urgent questions before your visit. Thank you for taking the time to provide details through MyChart.  Let our nurse know and she can import this information into your encounter documents.  Arrival and Wait Times: [x]   Arriving on time ensures that everyone receives prompt attention. [x]   Early morning (8a) and afternoon (1p) appointments tend to have shortest wait times. [x]   Unfortunately, we cannot delay appointments for late arrivals or hold slots during phone calls.  Getting Answers and Following Up [x]   Simple Questions & Concerns: For quick questions or basic follow-up after your visit, reach us  at [phone] or MyChart messaging. [x]   Complex Concerns: If your concern is more complex, scheduling an appointment might be best. Discuss this with the staff to find the most suitable option. [x]   Lab & Imaging Results: We'll contact you directly if results are abnormal or you don't use MyChart. Most normal results will be on MyChart within 2-3 business days, with a review message from Dr. Cone. Haven't heard back in 2 weeks? Need results sooner? Contact us  at [phone]. [x]   Referrals: Our referral coordinator will manage specialist referrals. The specialist's office should contact you within 2 weeks to schedule an appointment. Call us   if you haven't heard from them after 2  weeks.  Staying Connected [x]   MyChart: Activate your MyChart for the fastest way to access results and message us . See the last page of this paperwork for instructions on how to activate.  Bring to Your Next Appointment [x]   Medications: Please bring all your medication bottles to your next appointment to ensure we have an accurate record of your prescriptions. [x]   Health Diaries: If you're monitoring any health conditions at home, keeping a diary of your readings can be very helpful for discussions at your next appointment.  Billing [x]   X-ray & Lab Orders: These are billed by separate companies. Contact the invoicing company directly for questions or concerns. [x]   Visit Charges: Discuss any billing inquiries with our administrative services team.  Your Satisfaction Matters [x]   Share Your Experience: We strive for your satisfaction! If you have any complaints, or preferably compliments, please let Dr. Jesus know directly or contact our Practice Administrators, Manuelita Rubin or [employer], by asking at the front desk.   Reviewing Your Records [x]   Review this early draft of your clinical encounter notes below and the final encounter summary tomorrow on MyChart after its been completed.  All orders placed so far are visible here: Chronic fatigue -     DG Chest 2 View; Future -     CBC with Differential/Platelet -     Comprehensive metabolic panel with GFR -     Sedimentation rate -     Troponin I (High Sensitivity); Future -     Brain natriuretic peptide -     B12 and Folate Panel -     Rheumatoid factor -     ANA  Upset stomach -     DG Abd 2 Views; Future  Idiopathic peripheral neuropathy -     Alpha-Lipoic Acid; Take 2 tablets (600 mg total) by mouth daily at 6 (six) AM.  Dispense: 180 tablet; Refill: 4 -     Diclofenac  Sodium; Apply 2 g topically in the morning and at bedtime.  Dispense: 1200 g; Refill: 11 -     Lidocaine ; Apply to the affected area(s) topically as  needed.  Dispense: 35.44 g; Refill: 0 -     Pregabalin ; Take 1 capsule (200 mg total) by mouth 2 (two) times daily.  Dispense: 60 capsule; Refill: 5  Primary osteoarthritis involving multiple joints -     Celecoxib ; Take 1 capsule (200 mg total) by mouth daily.  Dispense: 90 capsule; Refill: 4 -     Diclofenac  Sodium; Apply 2 g topically in the morning and at bedtime.  Dispense: 1200 g; Refill: 11 -     Brain natriuretic peptide -     B12 and Folate Panel -     Rheumatoid factor -     ANA  Vitamin D  deficiency -     Vitamin D -3; Take 1 tablet by mouth daily.  Dispense: 90 tablet; Refill: 4  Thromboangiitis obliterans (Buerger's disease) (HCC) -     Cilostazol ; Take 1 tablet (50 mg total) by mouth 2 (two) times daily.  Dispense: 90 tablet; Refill: 4 -     NIFEdipine  ER Osmotic Release; Take 1 tablet (30 mg total) by mouth daily.  Dispense: 90 tablet; Refill: 1  Chronic kidney disease, stage 2 (mild) -     Diclofenac  Sodium; Apply 2 g topically in the morning and at bedtime.  Dispense: 1200 g; Refill: 11 -     Pregabalin ; Take 1  capsule (200 mg total) by mouth 2 (two) times daily.  Dispense: 60 capsule; Refill: 5  Small fiber neuropathy -     Diclofenac  Sodium; Apply 2 g topically in the morning and at bedtime.  Dispense: 1200 g; Refill: 11 -     Pregabalin ; Take 1 capsule (200 mg total) by mouth 2 (two) times daily.  Dispense: 60 capsule; Refill: 5  Seasonal allergic rhinitis, unspecified trigger -     Fluticasone  Propionate; Place 1 spray into both nostrils daily as needed.  Dispense: 16 g; Refill: 11  Other seborrheic dermatitis -     Ketoconazole ; Apply to the affected area(s) and let sit 3-5 minutes before rinsing.  Dispense: 120 mL; Refill: 5  Injury of external auditory canal, initial encounter -     Neomycin -Polymyxin-HC; Place 3 drops into the left ear 3 (three) times daily for 3 days.  Dispense: 10 mL; Refill: 0  RUQ abdominal pain  PAD (peripheral artery disease)  (HCC)  Raynaud's disease without gangrene -     NIFEdipine  ER Osmotic Release; Take 1 tablet (30 mg total) by mouth daily.  Dispense: 90 tablet; Refill: 1 -     Brain natriuretic peptide -     B12 and Folate Panel -     Rheumatoid factor -     ANA  Aortic atherosclerosis (HCC) -     Atorvastatin  Calcium ; Take 1 tablet (40 mg total) by mouth at bedtime.  Dispense: 90 tablet; Refill: 3 -     Omega-3-acid  Ethyl Esters; Take 2 capsules (2 g total) by mouth 2 (two) times daily.  Dispense: 180 capsule; Refill: 3  Dyslipidemia -     Omega-3-acid  Ethyl Esters; Take 2 capsules (2 g total) by mouth 2 (two) times daily.  Dispense: 180 capsule; Refill: 3  Hypertriglyceridemia -     Omega-3-acid  Ethyl Esters; Take 2 capsules (2 g total) by mouth 2 (two) times daily.  Dispense: 180 capsule; Refill: 3  Essential tremor -     Primidone ; Take 1 tablet (50 mg total) by mouth 2 (two) times daily in morning and evening.  Dispense: 180 tablet; Refill: 3  OSA on CPAP -     Tirzepatide -Weight Management; Inject 2.5 mg into the skin once a week.  Dispense: 2 mL; Refill: 11  Cracking skin -     Triamcinolone  Acetonide; Apply to the affected area(s) topically 2 (two) times daily. Use for up to 14 days.  Dispense: 30 g; Refill: 0  Constipation, unspecified constipation type -     DG Abd 2 Views; Future  Shortness of breath -     DG Chest 2 View; Future  Lateral epicondylitis, right elbow -     Rheumatoid factor -     ANA

## 2023-10-08 ENCOUNTER — Ambulatory Visit: Payer: Self-pay | Admitting: Internal Medicine

## 2023-10-08 DIAGNOSIS — G629 Polyneuropathy, unspecified: Secondary | ICD-10-CM | POA: Insufficient documentation

## 2023-10-08 DIAGNOSIS — E785 Hyperlipidemia, unspecified: Secondary | ICD-10-CM | POA: Insufficient documentation

## 2023-10-08 DIAGNOSIS — K5909 Other constipation: Secondary | ICD-10-CM

## 2023-10-08 DIAGNOSIS — E559 Vitamin D deficiency, unspecified: Secondary | ICD-10-CM | POA: Insufficient documentation

## 2023-10-08 DIAGNOSIS — L218 Other seborrheic dermatitis: Secondary | ICD-10-CM | POA: Insufficient documentation

## 2023-10-08 DIAGNOSIS — E781 Pure hyperglyceridemia: Secondary | ICD-10-CM | POA: Insufficient documentation

## 2023-10-08 DIAGNOSIS — K30 Functional dyspepsia: Secondary | ICD-10-CM | POA: Insufficient documentation

## 2023-10-08 DIAGNOSIS — I7300 Raynaud's syndrome without gangrene: Secondary | ICD-10-CM | POA: Insufficient documentation

## 2023-10-08 DIAGNOSIS — I739 Peripheral vascular disease, unspecified: Secondary | ICD-10-CM | POA: Insufficient documentation

## 2023-10-08 LAB — COMPREHENSIVE METABOLIC PANEL WITH GFR
ALT: 40 U/L (ref 0–53)
AST: 26 U/L (ref 0–37)
Albumin: 4.6 g/dL (ref 3.5–5.2)
Alkaline Phosphatase: 82 U/L (ref 39–117)
BUN: 31 mg/dL — ABNORMAL HIGH (ref 6–23)
CO2: 26 meq/L (ref 19–32)
Calcium: 9.4 mg/dL (ref 8.4–10.5)
Chloride: 104 meq/L (ref 96–112)
Creatinine, Ser: 1.49 mg/dL (ref 0.40–1.50)
GFR: 43.8 mL/min — ABNORMAL LOW (ref >60.00)
Glucose, Bld: 94 mg/dL (ref 70–99)
Potassium: 4.1 meq/L (ref 3.5–5.1)
Sodium: 142 meq/L (ref 135–145)
Total Bilirubin: 0.5 mg/dL (ref 0.2–1.2)
Total Protein: 7.1 g/dL (ref 6.0–8.3)

## 2023-10-08 LAB — SEDIMENTATION RATE: Sed Rate: 7 mm/h (ref 0–20)

## 2023-10-08 LAB — B12 AND FOLATE PANEL
Folate: >23.2 ng/mL (ref >5.9)
Vitamin B-12: 1138 pg/mL — ABNORMAL HIGH (ref 211–911)

## 2023-10-08 LAB — CBC WITH DIFFERENTIAL/PLATELET
Basophils Absolute: 0.1 10*3/uL (ref 0.0–0.1)
Basophils Relative: 1.1 % (ref 0.0–3.0)
Eosinophils Absolute: 0.2 10*3/uL (ref 0.0–0.7)
Eosinophils Relative: 2.2 % (ref 0.0–5.0)
HCT: 44 % (ref 39.0–52.0)
Hemoglobin: 14.8 g/dL (ref 13.0–17.0)
Lymphocytes Relative: 27.5 % (ref 12.0–46.0)
Lymphs Abs: 2.2 10*3/uL (ref 0.7–4.0)
MCHC: 33.6 g/dL (ref 30.0–36.0)
MCV: 89.9 fl (ref 78.0–100.0)
Monocytes Absolute: 0.8 10*3/uL (ref 0.1–1.0)
Monocytes Relative: 10.4 % (ref 3.0–12.0)
Neutro Abs: 4.7 10*3/uL (ref 1.4–7.7)
Neutrophils Relative %: 58.8 % (ref 43.0–77.0)
Platelets: 227 10*3/uL (ref 150.0–400.0)
RBC: 4.9 Mil/uL (ref 4.22–5.81)
RDW: 14.1 % (ref 11.5–15.5)
WBC: 8 10*3/uL (ref 4.0–10.5)

## 2023-10-08 LAB — BRAIN NATRIURETIC PEPTIDE: Pro B Natriuretic peptide (BNP): 12 pg/mL (ref 0.0–100.0)

## 2023-10-08 NOTE — Assessment & Plan Note (Signed)
 Will get abdominal x-ray question possible stress ulcer

## 2023-10-08 NOTE — Assessment & Plan Note (Signed)
 Refilled and transferred medication(s) per patient request

## 2023-10-08 NOTE — Assessment & Plan Note (Signed)
 There might be a compounding inflammatory arthritis going on currently we will do some additional testing and transfer medications

## 2023-10-08 NOTE — Progress Notes (Signed)
 The x-ray showed the pneumonia looks like it was going away on the left and nothing on the right where I heard the crackles.  However it is hard to say for sure with the x-ray and if he feels like he still has pneumonia I think it is reasonable to do another round of antibiotics, call and see if he wants to do that.

## 2023-10-08 NOTE — Assessment & Plan Note (Signed)
 Check labs

## 2023-10-08 NOTE — Addendum Note (Signed)
 Addended by: Desha Bitner G on: 10/08/2023 08:01 PM   Modules accepted: Level of Service

## 2023-10-08 NOTE — Assessment & Plan Note (Signed)
 Suspicious for inflammatory cause possibly persistent pneumonia we will do blood work and chest x-ray

## 2023-10-08 NOTE — Assessment & Plan Note (Signed)
 This is a chronic problem that we have never been able to narrow down the cause of we will transfer medications as requested

## 2023-10-09 ENCOUNTER — Other Ambulatory Visit: Payer: Self-pay | Admitting: Internal Medicine

## 2023-10-09 ENCOUNTER — Telehealth: Payer: Self-pay

## 2023-10-09 ENCOUNTER — Other Ambulatory Visit: Payer: Self-pay

## 2023-10-09 ENCOUNTER — Other Ambulatory Visit (HOSPITAL_BASED_OUTPATIENT_CLINIC_OR_DEPARTMENT_OTHER): Payer: Self-pay

## 2023-10-09 DIAGNOSIS — J189 Pneumonia, unspecified organism: Secondary | ICD-10-CM

## 2023-10-09 LAB — ANA: Anti Nuclear Antibody (ANA): POSITIVE — AB

## 2023-10-09 LAB — ANTI-NUCLEAR AB-TITER (ANA TITER): ANA Titer 1: 1:40 {titer} — ABNORMAL HIGH

## 2023-10-09 LAB — RHEUMATOID FACTOR: Rheumatoid fact SerPl-aCnc: <10 [IU]/mL (ref <14)

## 2023-10-09 MED ORDER — DOXYCYCLINE HYCLATE 100 MG PO TABS
100.0000 mg | ORAL_TABLET | Freq: Two times a day (BID) | ORAL | 0 refills | Status: DC
  Filled 2023-10-09: qty 20, 10d supply, fill #0

## 2023-10-09 NOTE — Telephone Encounter (Signed)
 Spoke with pt wife provider sent in abx to [facility].   Copied from CRM [id number]. Topic: Clinical - Medication Question >> Oct 09, 2023  2:41 PM [place] J wrote: Reason for CRM: Patient's wife is calling in to check on status of the antibiotics from the previous discussion she had with the medication assistant after lab results were relayed. She was wondering if we could please call once the medication is sent.   [facility] [address] [place] [address] Phone: [phone] Fax: [phone] Hours: Mon-Fri 7:30am-6pm; Sat 8:00am-4:30p

## 2023-10-10 ENCOUNTER — Other Ambulatory Visit (HOSPITAL_COMMUNITY): Payer: Self-pay

## 2023-10-12 DIAGNOSIS — K5909 Other constipation: Secondary | ICD-10-CM | POA: Insufficient documentation

## 2023-10-12 NOTE — Progress Notes (Signed)
 Result Management Documentation  Date/Time: 10/12/2023, Saturday 9am  Results Reviewed:      Labs from 10/07/23: Acute worsening of kidney function (GFR 43.8, creatinine 1.49, BUN 31), new low-positive ANA, otherwise stable labs.     Chest x-ray: Resolving left lower lobe opacity, no acute findings.  Patient Contacted by Phone:      Patient reports feeling somewhat better today, with the best energy since his pneumonia.     He is tolerating doxycycline  and reports no new symptoms.     Denies swelling of legs, decreased urine output, vomiting     Continues to feel fatigued but notes mild improvement.     Making adequate urine, no GI symptoms.     Instructed to rest, maintain good hydration, and avoid Celebrex .     Reviewed ER precautions: to seek immediate care for new/worsening swelling, decreased urination, chest pain, shortness of breath, severe weakness/confusion, or inability to keep fluids down.     Patient verbalized understanding and agreed to plan.     Already scheduled for follow-up appointment on Tuesday.  Plan:      No further nursing follow-up needed at this time.     Reinforced plan for rest, hydration, and ER precautions.     Will reassess kidney function and overall status at upcoming appointment.

## 2023-10-14 ENCOUNTER — Other Ambulatory Visit: Payer: Self-pay

## 2023-10-14 ENCOUNTER — Other Ambulatory Visit (HOSPITAL_BASED_OUTPATIENT_CLINIC_OR_DEPARTMENT_OTHER): Payer: Self-pay

## 2023-10-15 ENCOUNTER — Ambulatory Visit: Admitting: Internal Medicine

## 2023-10-15 ENCOUNTER — Encounter: Payer: Self-pay | Admitting: Internal Medicine

## 2023-10-15 VITALS — BP 100/60 | HR 68 | Temp 98.2°F | Ht 70.5 in | Wt 167.6 lb

## 2023-10-15 DIAGNOSIS — R634 Abnormal weight loss: Secondary | ICD-10-CM

## 2023-10-15 DIAGNOSIS — J3089 Other allergic rhinitis: Secondary | ICD-10-CM | POA: Diagnosis not present

## 2023-10-15 DIAGNOSIS — G9339 Other post infection and related fatigue syndromes: Secondary | ICD-10-CM | POA: Diagnosis not present

## 2023-10-15 DIAGNOSIS — J690 Pneumonitis due to inhalation of food and vomit: Secondary | ICD-10-CM

## 2023-10-15 DIAGNOSIS — J189 Pneumonia, unspecified organism: Secondary | ICD-10-CM

## 2023-10-15 DIAGNOSIS — R42 Dizziness and giddiness: Secondary | ICD-10-CM

## 2023-10-15 NOTE — Progress Notes (Signed)
 ==============================  [facility]: [phone]   -- Medical Office Visit --  Patient: Stephen Anderson      Age: 83 y.o.       Sex:  male  Date:   10/15/2023 Today's Healthcare Provider: Bernardino KANDICE Cone, MD  ==============================   Chief Complaint: cough (Really tired congested still states new abx he can't drive or anything states he is drinking a lot just chest congestion and all. Pt is not able to get in the yard like he would like sob at times. States has been going on for 3 to 4 weeks. )   Discussed the use of AI scribe software for clinical note transcription with the patient, who gave verbal consent to proceed.  History of Present Illness The patient is an 83 year old male with a complex medical history including bronchiectasis, chronic fatigue, CKD II, OSA on CPAP, prior pneumonia, and significant cardiac and vascular comorbidities. He presents for follow-up of persistent respiratory symptoms following a recent diagnosis of pneumonia. Approximately 3-4 weeks ago, he began experiencing cough and chest congestion, which initially limited his ability to perform daily activities and caused significant fatigue. Since starting doxycycline  about a week ago (with 3 days remaining), he notes a significant decrease in coughing. The cough is now only occasional and non-productive. He has never had hemoptysis or pleuritic chest pain. He also denies fevers, chills, night sweats, or wheezing. He continues to experience fatigue, which he partly attributes to the antibiotic, reporting that it makes him drowsy and causes him to fall asleep in his recliner during the day. He describes persistent dyspnea on exertion, such as when walking to the paper box, and notes diaphoresis with minimal activity. He denies orthopnea or shortness of breath at rest. He uses a CPAP machine at night, which he feels helps his breathing. He reports ongoing but  improving nasal symptoms, including sneezing and rhinorrhea, which he attributes to allergies and manages with a nasal spray. He feels as though he is "talking through a head cold" and reports some mild hoarseness. He denies recent sick contacts, travel, or hospitalizations. He reports occasional dizziness when standing up quickly, particularly after taking his antibiotic, but denies syncope, palpitations, or chest pain. He has not noticed any lower extremity swelling, confusion, or acute neurological changes. 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 He recalls a recent exposure to concrete dust prior to the onset of his symptoms. He is unsure if aspiration may have played a role, but notes a prior history of choking episodes, though none recently. He reports a recent weight loss of 10-12 pounds, with a slight regain of about one pound. His appetite is good and he has regular bowel movements with the help of a stool softener. He is frustrated by his inability to work outside and the need to avoid allergens and dust due to his current respiratory symptoms. Pertinent Negatives: No productive cough, no hemoptysis No fever, chills, or night sweats No wheezing No orthopnea, no SOB at rest No pleuritic chest pain, no edema No confusion, no acute neurologic symptoms No recent sick contacts, travel, or hospitalizations No recent choking episodes Pertinent Positives: Persistent but improving, non-productive cough Sneezing and rhinorrhea (improving) Hoarseness, sensation of "head cold" Dyspnea on exertion, diaphoresis with minimal activity Fatigue, drowsiness (possibly medication-related) Occasional dizziness with rapid standing History  of aspiration and recent concrete dust exposure Recent weight loss, now stabilized Physical Exam  (prior): Well-appearing, no acute distress Pulmonary: Persistent crackles at left lung base O2 saturation: 98% on room air Recent Imaging: CXR (10/08/2023): Persistent but decreased left lower lobe opacity; differential includes atelectasis vs. resolving pneumonia Summary: 83 year old male with bronchiectasis and multiple comorbidities presents with gradually improving cough and upper respiratory symptoms following pneumonia, ongoing dyspnea on exertion, diaphoresis with activity, persistent fatigue, and a history of significant occupational dust exposure and possible aspiration. Exam and imaging show persistent left lower lobe crackles and opacity, but no acute infectious symptoms. He is frustrated by his current functional limitations and avoidance of outdoor activities.  Verbal consent obtained for use of AI scribe software for clinical note transcription.   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, not believe to be due to lupus or systemic sclerosis; Diverticular disease; Right inguinal hernia; Aortic atherosclerosis (HCC); Bronchiectasis (HCC); Recurrent kidney stones; Calcification of prostate; DDD (degenerative disc disease), lumbar; LVH (left ventricular hypertrophy); Grade I diastolic dysfunction; Weight loss; Gastritis; Hiatal hernia; Lumbar back pain; Arthritis of both hands; Trigger finger, right ring finger; Chronic kidney disease, stage 2 (mild); Prediabetes; 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 Past Surgical History:   has a past surgical history that includes Gallbladder surgery; Hernia repair; Cataract extraction, bilateral; and Melanoma excision. Social History:   reports that he quit smoking about 15 years ago. His smoking use included cigarettes. He started smoking about 65 years ago. He has a 50 pack-year smoking history. He has never been exposed to tobacco smoke. He quit smokeless tobacco use about 21 years ago. He reports that he does not drink alcohol and does not use drugs. Family History:  family history includes Bladder Cancer in his brother; Heart disease in his brother; Lung cancer in his father; Neuropathy in his daughter. Allergies:  is allergic to hydrocodone, oxycodone, oxycontin [oxycodone hcl], and simvastatin.   Medication Reconciliation: Current Outpatient Medications on File Prior to Visit  Medication Sig   Acetaminophen (TYLENOL ARTHRITIS EXT RELIEF PO) Take 1  tablet by mouth every other day.   Alpha-Lipoic Acid 300 MG TABS Take 2 tablets (600 mg total) by mouth daily at 6 (six) AM.   Ascorbic Acid (VITAMIN C PO) Take 500 mg by mouth daily.   aspirin 81 MG tablet Take 81 mg by mouth every other day.    atorvastatin  (LIPITOR) 40 MG tablet  Take 1 tablet (40 mg total) by mouth at bedtime.   caffeine 200 MG TABS tablet Take 200 mg by mouth in the morning. STAY AWAKE   celecoxib  (CELEBREX ) 200 MG capsule Take 1 capsule (200 mg total) by mouth daily.   Cholecalciferol  (VITAMIN D -3) 125 MCG (5000 UT) TABS Take 1 tablet by mouth daily.   cilostazol  (PLETAL ) 50 MG tablet Take 1 tablet (50 mg total) by mouth 2 (two) times daily.   Cyanocobalamin  (B-12 PO) Take 1 tablet by mouth daily.   diclofenac  Sodium (VOLTAREN ) 1 % GEL Apply 2 g topically in the morning and at bedtime.   doxycycline  (VIBRA -TABS) 100 MG tablet Take 1 tablet (100 mg total) by mouth 2 (two) times daily.   fluticasone  (FLONASE ) 50 MCG/ACT nasal spray Place 1 spray into both nostrils daily as needed.   ketoconazole  (NIZORAL ) 2 % shampoo Apply to the affected area(s) and let sit 3-5 minutes before rinsing.   lidocaine  (XYLOCAINE ) 5 % ointment Apply to the affected area(s) topically as needed.   NIFEdipine  (PROCARDIA -XL/NIFEDICAL-XL) 30 MG 24 hr tablet Take 1 tablet (30 mg total) by mouth daily.   NON FORMULARY CPAP   omega-3 acid ethyl esters (LOVAZA ) 1 g capsule Take 2 capsules (2 g total) by mouth 2 (two) times daily.   pregabalin  (LYRICA ) 200 MG capsule Take 1 capsule (200 mg total) by mouth 2 (two) times daily.   primidone  (MYSOLINE ) 50 MG tablet Take 1 tablet (50 mg total) by mouth 2 (two) times daily in morning and evening.   tirzepatide  (ZEPBOUND ) 2.5 MG/0.5ML Pen Inject 2.5 mg into the skin once a week.   triamcinolone  cream (KENALOG ) 0.1 % Apply to the affected area(s) topically 2 (two) times daily. Use for up to 14 days.   pantoprazole  (PROTONIX ) 40 MG tablet TAKE 1 TABLET BY MOUTH  DAILY   No current facility-administered medications on file prior to visit.  There are no discontinued medications.   Physical Exam:    10/15/2023    8:29 AM 10/07/2023    2:36 PM 09/11/2023    7:58 AM  Vitals with BMI  Height 5' 10.5 5' 10.5 5' 10.5  Weight 167 lbs 10 oz 166 lbs 10 oz 175 lbs 3 oz  BMI 23.7 23.56 24.77  Systolic 100 110 887  Diastolic 60 64 62  Pulse 68 78 68  Vital signs reviewed.  Nursing notes reviewed. Weight trend reviewed. Physical Exam General Appearance:  No acute distress appreciable.   Well-groomed, healthy-appearing male.  Well proportioned with no abnormal fat distribution.  Good muscle tone. Pulmonary:  Normal work of breathing at rest, no respiratory distress apparent. SpO2: 98 %  Musculoskeletal: All extremities are intact.  Neurological:  Awake, alert, oriented, and engaged.  No obvious focal neurological deficits or cognitive impairments.  Sensorium seems unclouded.   Speech is clear and coherent with logical content. Psychiatric:  Appropriate mood, pleasant and cooperative demeanor, thoughtful and engaged during the exam Physical Exam CHEST: Breath sounds improved with crackles in the left lower lobe.  Results:    05/17/2023    8:48 AM 05/01/2023    8:35 AM 03/27/2023    8:50 AM 01/16/2023    8:45 AM  PHQ 2/9 Scores  PHQ - 2 Score 0 0 0 1  PHQ- 9 Score  2 1 4    Results LABS Blood work: Signs of inflammation  RADIOLOGY Chest X-ray: Persistent crackles in the left lower lung (10/12/2023)    No results found for any  visits on 10/15/23. Office Visit on 10/07/2023  Component Date Value Ref Range Status   WBC 10/07/2023 8.0  4.0 - 10.5 K/uL Final   RBC 10/07/2023 4.90  4.22 - 5.81 Mil/uL Final   Hemoglobin 10/07/2023 14.8  13.0 - 17.0 g/dL Final   HCT 93/69/7974 44.0  39.0 - 52.0 % Final   MCV 10/07/2023 89.9  78.0 - 100.0 fl Final   MCHC 10/07/2023 33.6  30.0 - 36.0 g/dL Final   RDW 93/69/7974 14.1  11.5 - 15.5 % Final   Platelets  10/07/2023 227.0  150.0 - 400.0 K/uL Final   Neutrophils Relative % 10/07/2023 58.8  43.0 - 77.0 % Final   Lymphocytes Relative 10/07/2023 27.5  12.0 - 46.0 % Final   Monocytes Relative 10/07/2023 10.4  3.0 - 12.0 % Final   Eosinophils Relative 10/07/2023 2.2  0.0 - 5.0 % Final   Basophils Relative 10/07/2023 1.1  0.0 - 3.0 % Final   Neutro Abs 10/07/2023 4.7  1.4 - 7.7 K/uL Final   Lymphs Abs 10/07/2023 2.2  0.7 - 4.0 K/uL Final   Monocytes Absolute 10/07/2023 0.8  0.1 - 1.0 K/uL Final   Eosinophils Absolute 10/07/2023 0.2  0.0 - 0.7 K/uL Final   Basophils Absolute 10/07/2023 0.1  0.0 - 0.1 K/uL Final   Sodium 10/07/2023 142  135 - 145 mEq/L Final   Potassium 10/07/2023 4.1  3.5 - 5.1 mEq/L Final   Chloride 10/07/2023 104  96 - 112 mEq/L Final   CO2 10/07/2023 26  19 - 32 mEq/L Final   Glucose, Bld 10/07/2023 94  70 - 99 mg/dL Final   BUN 93/69/7974 31 (H)  6 - 23 mg/dL Final   Creatinine, Ser 10/07/2023 1.49  0.40 - 1.50 mg/dL Final   Total Bilirubin 10/07/2023 0.5  0.2 - 1.2 mg/dL Final   Alkaline Phosphatase 10/07/2023 82  39 - 117 U/L Final   AST 10/07/2023 26  0 - 37 U/L Final   ALT 10/07/2023 40  0 - 53 U/L Final   Total Protein 10/07/2023 7.1  6.0 - 8.3 g/dL Final   Albumin 93/69/7974 4.6  3.5 - 5.2 g/dL Final   GFR 93/69/7974 43.80 (L)  >60.00 mL/min Final   Calcium  10/07/2023 9.4  8.4 - 10.5 mg/dL Final   Sed Rate 93/69/7974 7  0 - 20 mm/hr Final   Pro B Natriuretic peptide (BNP) 10/07/2023 12.0  0.0 - 100.0 pg/mL Final   Vitamin B-12 10/07/2023 1,138 (H)  211 - 911 pg/mL Final   Folate 10/07/2023 >23.2  >5.9 ng/mL Final   Rheumatoid fact SerPl-aCnc 10/07/2023 <10  <14 IU/mL Final   Anti Nuclear Antibody (ANA) 10/07/2023 POSITIVE (A)  NEGATIVE Final   ANA Titer 1 10/07/2023 1:40 (H)  titer Final   ANA Pattern 1 10/07/2023 Nuclear, Homogeneous (A)   Final  Office Visit on 08/21/2023  Component Date Value Ref Range Status   MICRO NUMBER: 08/21/2023 [id number]   Final    SPECIMEN QUALITY: 08/21/2023 Adequate   Final   Source: 08/21/2023 NOT GIVEN   Final   STATUS: 08/21/2023 FINAL   Final   GRAM STAIN: 08/21/2023 No epithelial cells seen No white blood cells seen No organisms seen   Final   ANA RESULT: 08/21/2023 No anaerobes isolated.   Final   MICRO NUMBER: 08/21/2023 [id number]   Final   SPECIMEN QUALITY: 08/21/2023 Adequate   Final   SOURCE: 08/21/2023 NOT GIVEN   Final   STATUS: 08/21/2023 FINAL  Final   AER RESULT: 08/21/2023 No Growth   Final   COMMENT: 08/21/2023 No source was provided. The specimen was tested and reported based upon the test code ordered. If this is incorrect, please contact client services.   Final  Office Visit on 08/14/2023  Component Date Value Ref Range Status   WBC 08/14/2023 11.4 (H)  4.0 - 10.5 K/uL Final   RBC 08/14/2023 4.23  4.22 - 5.81 Mil/uL Final   Hemoglobin 08/14/2023 13.0  13.0 - 17.0 g/dL Final   HCT 94/92/7974 38.3 (L)  39.0 - 52.0 % Final   MCV 08/14/2023 90.5  78.0 - 100.0 fl Final   MCHC 08/14/2023 33.9  30.0 - 36.0 g/dL Final   RDW 94/92/7974 13.9  11.5 - 15.5 % Final   Platelets 08/14/2023 228.0  150.0 - 400.0 K/uL Final   Neutrophils Relative % 08/14/2023 79.4 (H)  43.0 - 77.0 % Final   Lymphocytes Relative 08/14/2023 13.4  12.0 - 46.0 % Final   Monocytes Relative 08/14/2023 6.2  3.0 - 12.0 % Final   Eosinophils Relative 08/14/2023 0.2  0.0 - 5.0 % Final   Basophils Relative 08/14/2023 0.8  0.0 - 3.0 % Final   Neutro Abs 08/14/2023 9.0 (H)  1.4 - 7.7 K/uL Final   Lymphs Abs 08/14/2023 1.5  0.7 - 4.0 K/uL Final   Monocytes Absolute 08/14/2023 0.7  0.1 - 1.0 K/uL Final   Eosinophils Absolute 08/14/2023 0.0  0.0 - 0.7 K/uL Final   Basophils Absolute 08/14/2023 0.1  0.0 - 0.1 K/uL Final   Sodium 08/14/2023 137  135 - 145 mEq/L Final   Potassium 08/14/2023 4.8  3.5 - 5.1 mEq/L Final   Chloride 08/14/2023 105  96 - 112 mEq/L Final   CO2 08/14/2023 24  19 - 32 mEq/L Final   Glucose, Bld 08/14/2023 119 (H)   70 - 99 mg/dL Final   BUN 94/92/7974 27 (H)  6 - 23 mg/dL Final   Creatinine, Ser 08/14/2023 1.11  0.40 - 1.50 mg/dL Final   Total Bilirubin 08/14/2023 0.2  0.2 - 1.2 mg/dL Final   Alkaline Phosphatase 08/14/2023 74  39 - 117 U/L Final   AST 08/14/2023 28  0 - 37 U/L Final   ALT 08/14/2023 38  0 - 53 U/L Final   Total Protein 08/14/2023 7.0  6.0 - 8.3 g/dL Final   Albumin 94/92/7974 3.7  3.5 - 5.2 g/dL Final   GFR 94/92/7974 62.43  >60.00 mL/min Final   Calcium  08/14/2023 8.7  8.4 - 10.5 mg/dL Final   Pro B Natriuretic peptide (BNP) 08/14/2023 24.0  0.0 - 100.0 pg/mL Final   CRP 08/14/2023 4.0  0.5 - 20.0 mg/dL Final   Sed Rate 94/92/7974 44 (H)  0 - 20 mm/hr Final   Vitamin B-12 08/14/2023 634  211 - 911 pg/mL Final   Folate 08/14/2023 13.1  >5.9 ng/mL Final   SARS Coronavirus 2 Ag 08/14/2023 Negative  Negative Final  Office Visit on 07/31/2023  Component Date Value Ref Range Status   WBC 07/31/2023 7.0  4.0 - 10.5 K/uL Final   RBC 07/31/2023 4.75  4.22 - 5.81 Mil/uL Final   Hemoglobin 07/31/2023 14.5  13.0 - 17.0 g/dL Final   HCT 95/76/7974 43.5  39.0 - 52.0 % Final   MCV 07/31/2023 91.7  78.0 - 100.0 fl Final   MCHC 07/31/2023 33.3  30.0 - 36.0 g/dL Final   RDW 95/76/7974 14.0  11.5 - 15.5 % Final  Platelets 07/31/2023 167.0  150.0 - 400.0 K/uL Final   Neutrophils Relative % 07/31/2023 64.7  43.0 - 77.0 % Final   Lymphocytes Relative 07/31/2023 24.9  12.0 - 46.0 % Final   Monocytes Relative 07/31/2023 7.5  3.0 - 12.0 % Final   Eosinophils Relative 07/31/2023 2.3  0.0 - 5.0 % Final   Basophils Relative 07/31/2023 0.6  0.0 - 3.0 % Final   Neutro Abs 07/31/2023 4.5  1.4 - 7.7 K/uL Final   Lymphs Abs 07/31/2023 1.7  0.7 - 4.0 K/uL Final   Monocytes Absolute 07/31/2023 0.5  0.1 - 1.0 K/uL Final   Eosinophils Absolute 07/31/2023 0.2  0.0 - 0.7 K/uL Final   Basophils Absolute 07/31/2023 0.0  0.0 - 0.1 K/uL Final   Sodium 07/31/2023 138  135 - 145 mEq/L Final   Potassium 07/31/2023  4.2  3.5 - 5.1 mEq/L Final   Chloride 07/31/2023 104  96 - 112 mEq/L Final   CO2 07/31/2023 28  19 - 32 mEq/L Final   Glucose, Bld 07/31/2023 101 (H)  70 - 99 mg/dL Final   BUN 95/76/7974 27 (H)  6 - 23 mg/dL Final   Creatinine, Ser 07/31/2023 1.18  0.40 - 1.50 mg/dL Final   Total Bilirubin 07/31/2023 0.6  0.2 - 1.2 mg/dL Final   Alkaline Phosphatase 07/31/2023 59  39 - 117 U/L Final   AST 07/31/2023 16  0 - 37 U/L Final   ALT 07/31/2023 14  0 - 53 U/L Final   Total Protein 07/31/2023 6.7  6.0 - 8.3 g/dL Final   Albumin 95/76/7974 4.4  3.5 - 5.2 g/dL Final   GFR 95/76/7974 58.03 (L)  >60.00 mL/min Final   Calcium  07/31/2023 8.8  8.4 - 10.5 mg/dL Final   Phosphorus 95/76/7974 2.6  2.3 - 4.6 mg/dL Final   VITD 95/76/7974 47.95  30.00 - 100.00 ng/mL Final   PTH 07/31/2023 49  16 - 77 pg/mL Final   Uric Acid, Serum 07/31/2023 5.8  4.0 - 7.8 mg/dL Final   Color, Urine 95/76/7974 YELLOW  YELLOW Final   APPearance 07/31/2023 CLEAR  CLEAR Final   Specific Gravity, Urine 07/31/2023 1.019  1.001 - 1.035 Final   pH 07/31/2023 6.0  5.0 - 8.0 Final   Glucose, UA 07/31/2023 NEGATIVE  NEGATIVE Final   Bilirubin Urine 07/31/2023 NEGATIVE  NEGATIVE Final   Ketones, ur 07/31/2023 NEGATIVE  NEGATIVE Final   Hgb urine dipstick 07/31/2023 NEGATIVE  NEGATIVE Final   Protein, ur 07/31/2023 NEGATIVE  NEGATIVE Final   Nitrites, Initial 07/31/2023 NEGATIVE  NEGATIVE Final   Leukocyte Esterase 07/31/2023 NEGATIVE  NEGATIVE Final   WBC, UA 07/31/2023 NONE SEEN  0 - 5 /HPF Final   RBC / HPF 07/31/2023 NONE SEEN  0 - 2 /HPF Final   Squamous Epithelial / HPF 07/31/2023 NONE SEEN  < OR = 5 /HPF Final   Bacteria, UA 07/31/2023 NONE SEEN  NONE SEEN /HPF Final   Hyaline Cast 07/31/2023 NONE SEEN  NONE SEEN /LPF Final   Note 07/31/2023    Final   Vitamin B-12 07/31/2023 483  211 - 911 pg/mL Final   Methylmalonic Acid, Quant 07/31/2023 106  85 - 423 nmol/L Final   Ceruloplasmin 07/31/2023 20  14 - 30 mg/dL Final    Zinc  07/31/2023 60  60 - 130 mcg/dL Final   Total Protein 95/76/7974 6.4  6.1 - 8.1 g/dL Final   Albumin ELP 95/76/7974 4.1  3.8 - 4.8 g/dL Final  Alpha 1 07/31/2023 0.2  0.2 - 0.3 g/dL Final   Alpha 2 95/76/7974 0.7  0.5 - 0.9 g/dL Final   Beta Globulin 95/76/7974 0.4  0.4 - 0.6 g/dL Final   Beta 2 95/76/7974 0.3  0.2 - 0.5 g/dL Final   Gamma Globulin 07/31/2023 0.7 (L)  0.8 - 1.7 g/dL Final   Abnormal Protein Band1 07/31/2023   NONE DETECTED g/dL Final   SPE Interp. 95/76/7974    Final   Protein, Ur 08/02/2023 9.3  Not Estab. mg/dL Final   Protein, 75Y Urine 08/02/2023 Comment  30 - 150 mg/24 hr Final   ALBUMIN, U 08/02/2023 100.0  % Final   ALPHA 1 URINE 08/02/2023 0.0  % Final   ALPHA-2-GLOBULIN, U 08/02/2023 0.0  % Final   % BETA, Urine 08/02/2023 0.0  % Final   GAMMA GLOBULIN URINE 08/02/2023 0.0  % Final   M-SPIKE, % 08/02/2023 Not Observed  Not Observed % Final   M-Spike, mg/24 hr 08/02/2023 Comment  Not Observed mg/24 hr Final   Immunofixation, Urine 08/02/2023 Comment   Final   NOTE: 08/02/2023 Comment   Final   Free Kappa Lt Chains,Ur 08/02/2023 45.86  1.17 - 86.46 mg/L Final   Free Lambda Lt Chains,Ur 08/02/2023 8.15  0.27 - 15.21 mg/L Final   Kappa/Lambda Ratio,U 08/02/2023 5.63  1.83 - 14.26 Final   ANCA SCREEN 07/31/2023 Negative  Negative Final   Sed Rate 07/31/2023 4  0 - 20 mm/hr Final   CRP 07/31/2023 <1.0  0.5 - 20.0 mg/dL Final   Reflexve Urine Culture 07/31/2023    Final  Office Visit on 03/27/2023  Component Date Value Ref Range Status   Vitamin B-12 03/27/2023 1,492 (H)  211 - 911 pg/mL Final   Folate 03/27/2023 16.0  >5.9 ng/mL Final   Hgb A1c MFr Bld 03/27/2023 5.9  4.6 - 6.5 % Final   Ferritin 03/27/2023 53.8  22.0 - 322.0 ng/mL Final   Sodium 03/27/2023 142  135 - 145 mEq/L Final   Potassium 03/27/2023 4.3  3.5 - 5.1 mEq/L Final   Chloride 03/27/2023 105  96 - 112 mEq/L Final   CO2 03/27/2023 30  19 - 32 mEq/L Final   Glucose, Bld 03/27/2023 109 (H)   70 - 99 mg/dL Final   BUN 87/81/7975 21  6 - 23 mg/dL Final   Creatinine, Ser 03/27/2023 1.03  0.40 - 1.50 mg/dL Final   Total Bilirubin 03/27/2023 0.5  0.2 - 1.2 mg/dL Final   Alkaline Phosphatase 03/27/2023 68  39 - 117 U/L Final   AST 03/27/2023 17  0 - 37 U/L Final   ALT 03/27/2023 14  0 - 53 U/L Final   Total Protein 03/27/2023 6.8  6.0 - 8.3 g/dL Final   Albumin 87/81/7975 4.5  3.5 - 5.2 g/dL Final   GFR 87/81/7975 68.48  >60.00 mL/min Final   Calcium  03/27/2023 8.9  8.4 - 10.5 mg/dL Final   WBC 87/81/7975 6.3  4.0 - 10.5 K/uL Final   RBC 03/27/2023 4.74  4.22 - 5.81 Mil/uL Final   Hemoglobin 03/27/2023 14.6  13.0 - 17.0 g/dL Final   HCT 87/81/7975 43.8  39.0 - 52.0 % Final   MCV 03/27/2023 92.3  78.0 - 100.0 fl Final   MCHC 03/27/2023 33.3  30.0 - 36.0 g/dL Final   RDW 87/81/7975 14.0  11.5 - 15.5 % Final   Platelets 03/27/2023 185.0  150.0 - 400.0 K/uL Final   Neutrophils Relative % 03/27/2023 60.8  43.0 - 77.0 % Final   Lymphocytes Relative 03/27/2023 27.2  12.0 - 46.0 % Final   Monocytes Relative 03/27/2023 9.0  3.0 - 12.0 % Final   Eosinophils Relative 03/27/2023 2.3  0.0 - 5.0 % Final   Basophils Relative 03/27/2023 0.7  0.0 - 3.0 % Final   Neutro Abs 03/27/2023 3.8  1.4 - 7.7 K/uL Final   Lymphs Abs 03/27/2023 1.7  0.7 - 4.0 K/uL Final   Monocytes Absolute 03/27/2023 0.6  0.1 - 1.0 K/uL Final   Eosinophils Absolute 03/27/2023 0.1  0.0 - 0.7 K/uL Final   Basophils Absolute 03/27/2023 0.0  0.0 - 0.1 K/uL Final   Cholesterol 03/27/2023 123  0 - 200 mg/dL Final   Triglycerides 87/81/7975 81.0  0.0 - 149.0 mg/dL Final   HDL 87/81/7975 41.00  >39.00 mg/dL Final   VLDL 87/81/7975 16.2  0.0 - 40.0 mg/dL Final   LDL Cholesterol 03/27/2023 65  0 - 99 mg/dL Final   Total CHOL/HDL Ratio 03/27/2023 3   Final   NonHDL 03/27/2023 81.51   Final   VITD 03/27/2023 34.36  30.00 - 100.00 ng/mL Final   Anti Nuclear Antibody (ANA) 03/27/2023 Negative  Negative Final   Rheumatoid fact  SerPl-aCnc 03/27/2023 <10  <14 IU/mL Final   T3 Uptake 03/27/2023 28  22 - 35 % Final   T4, Total 03/27/2023 7.5  4.9 - 10.5 mcg/dL Final   Free Thyroxine Index 03/27/2023 2.1  1.4 - 3.8 Final   TSH 03/27/2023 1.05  0.40 - 4.50 mIU/L Final   Arsenic, 24H Ur 04/04/2023 <10  <=80 mcg/L Final   Lead, 24 hr urine 04/04/2023 <10  <80 mcg/L Final   Mercury, 24H Ur 04/04/2023 <4  <=20 mcg/L Final  Office Visit on 09/24/2022  Component Date Value Ref Range Status   Rheumatoid fact SerPl-aCnc 09/24/2022 <10  <14 IU/mL Final   Sed Rate 09/24/2022 2  0 - 20 mm/h Final   Scleroderma (Scl-70) (ENA) Antibod* 09/24/2022 <1.0 NEG  <1.0 NEG AI Final   Ribonucleic Protein(ENA) Antibody,* 09/24/2022 <1.0 NEG  <1.0 NEG AI Final   ENA SM Ab Ser-aCnc 09/24/2022 <1.0 NEG  <1.0 NEG AI Final   SSA (Ro) (ENA) Antibody, IgG 09/24/2022 <1.0 NEG  <1.0 NEG AI Final   ds DNA Ab 09/24/2022 1  IU/mL Final   C3 Complement 09/24/2022 124  82 - 185 mg/dL Final   C4 Complement 93/82/7975 21  15 - 53 mg/dL Final  Office Visit on 09/05/2022  Component Date Value Ref Range Status   WBC 09/05/2022 6.9  4.0 - 10.5 K/uL Final   RBC 09/05/2022 4.71  4.22 - 5.81 Mil/uL Final   Hemoglobin 09/05/2022 14.1  13.0 - 17.0 g/dL Final   HCT 94/70/7975 42.4  39.0 - 52.0 % Final   MCV 09/05/2022 90.2  78.0 - 100.0 fl Final   MCHC 09/05/2022 33.2  30.0 - 36.0 g/dL Final   RDW 94/70/7975 14.9  11.5 - 15.5 % Final   Platelets 09/05/2022 158.0  150.0 - 400.0 K/uL Final   Neutrophils Relative % 09/05/2022 62.2  43.0 - 77.0 % Final   Lymphocytes Relative 09/05/2022 25.1  12.0 - 46.0 % Final   Monocytes Relative 09/05/2022 9.9  3.0 - 12.0 % Final   Eosinophils Relative 09/05/2022 2.2  0.0 - 5.0 % Final   Basophils Relative 09/05/2022 0.6  0.0 - 3.0 % Final   Neutro Abs 09/05/2022 4.3  1.4 - 7.7 K/uL Final  Lymphs Abs 09/05/2022 1.7  0.7 - 4.0 K/uL Final   Monocytes Absolute 09/05/2022 0.7  0.1 - 1.0 K/uL Final   Eosinophils Absolute  09/05/2022 0.2  0.0 - 0.7 K/uL Final   Basophils Absolute 09/05/2022 0.0  0.0 - 0.1 K/uL Final   Ferritin 09/05/2022 29.4  22.0 - 322.0 ng/mL Final   Cholesterol 09/05/2022 123  0 - 200 mg/dL Final   Triglycerides 94/70/7975 160.0 (H)  0.0 - 149.0 mg/dL Final   HDL 94/70/7975 34.60 (L)  >60.99 mg/dL Final   VLDL 94/70/7975 32.0  0.0 - 40.0 mg/dL Final   LDL Cholesterol 09/05/2022 57  0 - 99 mg/dL Final   Total CHOL/HDL Ratio 09/05/2022 4   Final   NonHDL 09/05/2022 88.83   Final   Sodium 09/05/2022 142  135 - 145 mEq/L Final   Potassium 09/05/2022 4.4  3.5 - 5.1 mEq/L Final   Chloride 09/05/2022 107  96 - 112 mEq/L Final   CO2 09/05/2022 28  19 - 32 mEq/L Final   Glucose, Bld 09/05/2022 101 (H)  70 - 99 mg/dL Final   BUN 94/70/7975 19  6 - 23 mg/dL Final   Creatinine, Ser 09/05/2022 1.17  0.40 - 1.50 mg/dL Final   Total Bilirubin 09/05/2022 0.4  0.2 - 1.2 mg/dL Final   Alkaline Phosphatase 09/05/2022 73  39 - 117 U/L Final   AST 09/05/2022 18  0 - 37 U/L Final   ALT 09/05/2022 15  0 - 53 U/L Final   Total Protein 09/05/2022 6.5  6.0 - 8.3 g/dL Final   Albumin 94/70/7975 4.0  3.5 - 5.2 g/dL Final   GFR 94/70/7975 59.00 (L)  >60.00 mL/min Final   Calcium  09/05/2022 8.6  8.4 - 10.5 mg/dL Final   VITD 94/70/7975 46.57  30.00 - 100.00 ng/mL Final   Magnesium 09/05/2022 1.9  1.5 - 2.5 mg/dL Final   Phosphorus 94/70/7975 2.1 (L)  2.3 - 4.6 mg/dL Final   Uric Acid, Serum 09/05/2022 5.7  4.0 - 7.8 mg/dL Final  Scanned Document on 06/20/2022  Component Date Value Ref Range Status   [facility] Colonoscopy 06/20/2022 See Report (in chart)  See Report (in chart), Patient Reported Final  Appointment on 06/04/2022  Component Date Value Ref Range Status   Weight 06/04/2022 2,848  oz Final   Height 06/04/2022 70  in Final   BP 06/04/2022 122/70  mmHg Final   S' Lateral 06/04/2022 2.15  cm Final   Area-P 1/2 06/04/2022 3.77  cm2 Final   MV M vel 06/04/2022 3.23  m/s Final   MV Peak grad 06/04/2022 41.6   mmHg Final   Est EF 06/04/2022 55 - 60%   Final  Hospital Outpatient Visit on 05/20/2022  Component Date Value Ref Range Status   Creatinine, Ser 05/20/2022 1.30 (H)  0.61 - 1.24 mg/dL Final  There may be more visits with results that are not included.  No image results found. DG Abd 2 Views Result Date: 10/12/2023 CLINICAL DATA:  constipation and abd discomfort. EXAM: ABDOMEN - 2 VIEW COMPARISON:  May 20, 2022 FINDINGS: Nonobstructive bowel gas pattern. Moderate volume fecal loading in the ascending colon. No pneumoperitoneum. No organomegaly or radiopaque calculi. Cholecystectomy clips. No acute fracture or destructive lesion. The lung bases are clear.Multilevel degenerative disc disease of the spine. Diffuse aortic atherosclerosis. IMPRESSION: Nonobstructive bowel gas pattern. Moderate volume fecal loading in the ascending colon, as can be seen in constipation. Electronically Signed   By: Rogelia Carlean HERO.D.  On: 10/12/2023 12:18   DG Chest 2 View Result Date: 10/08/2023 CLINICAL DATA:  History of pneumonia, worsening fatigue EXAM: CHEST - 2 VIEW COMPARISON:  Chest x-ray Aug 14, 2023 FINDINGS: The cardiomediastinal silhouette is unchanged in contour. Persistent left lower lobe opacity, decreased in conspicuity compared to Aug 14, 2023. No new focal pulmonary opacity. No pleural effusion or pneumothorax. The visualized upper abdomen is unremarkable. No acute osseous abnormality. IMPRESSION: Persistent but decreased left lower lobe opacity. Differential considerations include atelectasis versus resolving pneumonia. Electronically Signed   By: Dirk Arrant M.D.   On: 10/08/2023 13:25   DG Chest 2 View Result Date: 08/16/2023 CLINICAL DATA:  Chest congestion.  Short of breath. EXAM: CHEST - 2 VIEW COMPARISON:  01/14/2019.  CT, 05/02/2022. FINDINGS: Cardiac silhouette is normal in size. No mediastinal or hilar masses. No evidence of adenopathy. Lungs are hyperexpanded. There is opacity in the  retrocardiac region of the left lower lobe. Minor linear scarring is noted lateral to this, which is stable. Remainder of the lungs is clear. Relative lucency in the upper lobes suggests emphysema. No pleural effusion or pneumothorax. Skeletal structures are intact. IMPRESSION: 1. Left lower lobe opacity consistent with pneumonia or atelectasis. 2. No other evidence of acute cardiopulmonary disease. 3. Emphysema. Electronically Signed   By: Alm Parkins M.D.   On: 08/16/2023 10:39    Wt Readings from Last 10 Encounters:  10/15/23 167 lb 9.6 oz (76 kg)  10/07/23 166 lb 9.6 oz (75.6 kg)  09/11/23 175 lb 3.2 oz (79.5 kg)  09/04/23 179 lb 6.4 oz (81.4 kg)  08/22/23 175 lb (79.4 kg)  08/21/23 175 lb 12.8 oz (79.7 kg)  08/14/23 175 lb (79.4 kg)  08/14/23 177 lb (80.3 kg)  07/31/23 177 lb (80.3 kg)  06/10/23 180 lb 12.8 oz (82 kg)          ASSESSMENT & PLAN   Assessment & Plan Pneumonia due to infectious organism, unspecified laterality, unspecified part of lung . Resolving Pneumonia with Bronchiectasis Exacerbation (J18.9, J47.9) Elderly male with bronchiectasis, prior pneumonia, and occupational dust exposure, presenting with persistent but improving cough, exertional dyspnea, and fatigue. On doxycycline  with clinical improvement; still has left lower lobe crackles and persistent radiographic opacity (CXR 10/08/23). Plan:  Complete doxycycline  course (3 days remaining). Monitor for any worsening (fever, increased cough, new/worsening SOB, hemoptysis, pleuritic pain). Encourage airway clearance: hydration, pulmonary hygiene, humidifier/steam inhalation, chest physiotherapy if needed. Avoid dust/allergen exposure for at least 2-3 weeks; stay indoors as much as possible. Follow-up CXR in 2-4 weeks to confirm resolution. If cough becomes productive or symptoms worsen, obtain sputum culture and consider pulmonology referral for possible CT chest. Counsel on aspiration precautions (upright posture  with meals, slow eating, small bites); consider swallow evaluation if symptoms recur. Other post infection and related fatigue syndromes Fatigue and Exertional Dyspnea (R53.83) Fatigue may be multifactorial: post-infectious, medication side effect, deconditioning, chronic lung/cardiac disease. Plan:  Rest and avoid exertion until antibiotic course is completed. Encourage gradual return to activity as tolerated. Continue CPAP for OSA. Monitor for orthopnea, PND, or LE edema (heart failure signs). Review cardiac medications and arrange cardiology follow-up as needed. 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 Plan:  Continue nasal spray and antihistamines as needed. Supportive care: saline nasal spray, humidification, voice rest. Monitor for persistent hoarseness (>3 weeks), dysphagia, or odynophagia--ENT referral if these develop. Avoid triggers (  dust, outdoor allergens). Postural dizziness  Orthostatic Dizziness (I95.1) Occasional dizziness with rapid standing, possibly related to dehydration, medication, or deconditioning. Plan:  Encourage slow position changes and adequate hydration. Review medications for agents that may contribute (antihypertensives, diuretics). Consider orthostatic vitals if symptoms persist or worsen. Monitor for syncope or falls. 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 Plan:  Avoid further dust exposure; use mask/respirator if return to such activities. Educate on aspiration risk reduction. Consider speech/swallow evaluation if recurrent symptoms. Recent weight loss  Recent Weight Loss (R63.4) Lost  10-12 lbs recently, now stable. Plan:  Monitor weight and nutritional status. Encourage adequate caloric and protein intake. Continue stool softener for regular bowel movements.  Preventive Care No RSV vaccine to date. Plan:  Discuss RSV, pneumococcal, and influenza vaccines at next visit. Review other age-appropriate immunizations. Return precautions: Seek immediate care for new/worsening SOB, chest pain, hemoptysis, confusion, persistent fever, inability to tolerate fluids/meds, or falls.  ORDER ASSOCIATIONS  #   DIAGNOSIS / CONDITION ICD-10 ENCOUNTER ORDER     ICD-10-CM   1. Pneumonia due to infectious organism, unspecified laterality, unspecified part of lung  J18.9 DG Chest 2 View    2. Other post infection and related fatigue syndromes  G93.39     3. Allergic rhinitis due to other allergic trigger, unspecified seasonality  J30.89     4. Postural dizziness  R42     5. Aspiration pneumonia of left lower lobe, unspecified aspiration pneumonia type (HCC)  J69.0     6. Recent weight loss  R63.4            Orders Placed in Encounter:      This document was synthesized by artificial intelligence ([employer]) using HIPAA-compliant recording of the clinical interaction;   We discussed the use of AI scribe software for clinical note transcription with the patient, who gave verbal consent to proceed. additional Info: This encounter employed state-of-the-art, real-time, collaborative documentation. The patient actively reviewed and assisted in updating their electronic medical record on a shared screen, ensuring transparency and facilitating joint problem-solving for the problem list, overview, and plan. This approach promotes accurate, informed care. The treatment plan was discussed and reviewed in detail, including medication safety, potential side effects, and all patient questions. We confirmed understanding and comfort with the plan. Follow-up instructions were established, including  contacting the office for any concerns, returning if symptoms worsen, persist, or new symptoms develop, and precautions for potential emergency department visits.   Attestation:  I personally performed and documented a medically appropriate history and examination, and engaged in medical decision-making of moderate complexity today. This encounter involved evaluation and management of an established patient with one or more chronic illnesses with mild exacerbation or progression (bronchiectasis with recent pneumonia), prescription drug management, and review of recent laboratory and radiologic data. The complexity of the problems addressed, the amount and/or complexity of data reviewed, and the risk of complications and morbidity support a level 3 established patient visit (CPT [phone]).

## 2023-10-15 NOTE — Patient Instructions (Addendum)
 Visit Summary You came in today because you are still having trouble with your breathing and energy after a recent lung infection (pneumonia). You have a history of lung problems (bronchiectasis) and other health issues. You are feeling more tired than usual, get out of breath when moving around, and notice a stuffy or runny nose and sneezing. Your cough has gotten better since starting antibiotics, but you still have some crackling sounds in your left lung. You have also had some dizziness when standing up quickly. We talked about your recent exposure to dust and your past issues with swallowing.  Your Test Results Blood tests: Your blood counts and kidney function are stable. There are no signs of a new infection. Some past tests showed mild inflammation, but nothing dangerous at this time. Chest X-ray: Your most recent chest x-ray shows that your lung infection is getting better, but there is still a cloudy area (opacity) in the lower part of your left lung. This is likely from your recent pneumonia or possibly some scarring from your lung condition. Weight: You lost some weight recently but have started to gain a little back.  Your Conditions and Treatment Plan 1. Lung Infection (Pneumonia) and Bronchiectasis Finish your current antibiotic (doxycycline ) as prescribed (3 more days). Drink plenty of fluids to help clear your lungs. Use a humidifier or breathe in steam to help with chest congestion. Avoid being around dust, smoke, or other things that can bother your lungs for at least 2-3 more weeks. Stay indoors as much as possible until your breathing improves. We will check your lungs again with a chest x-ray in 2-4 weeks to make sure the infection is gone. If your cough gets worse or you start coughing up mucus, let us  know. We may need to get a sample to test for infection. 2. Tiredness and Shortness of Breath Rest as much as you need while finishing your antibiotics. Try to slowly return  to your normal activities as you feel better. Keep using your CPAP machine every night for your sleep apnea. If you notice swelling in your legs, trouble breathing when lying down, or waking up at night short of breath, call our office. 3. Runny Nose, Sneezing, and Hoarseness (likely allergies) Keep using your nasal spray as needed for stuffy or runny nose. Use a humidifier or saline spray to keep your nose moist. Stay inside and avoid allergens (like pollen and dust) as much as possible. If your voice stays hoarse for more than 3 weeks or you have trouble swallowing, let us  know. 4. Dizziness When Standing Up Stand up slowly, especially from sitting or lying down. Drink plenty of water unless your kidney doctor has told you to limit fluids. If you feel faint, sit down right away. Tell us  if you actually faint, fall, or the dizziness gets worse. 5. Aspiration and Dust Exposure Sit upright when eating and take small bites to avoid choking. Avoid working around dust or wear a mask if you must be exposed. If you have any new choking episodes or trouble swallowing, let us  know. 6. Weight Loss Keep eating regular, balanced meals. Use your stool softener as needed to keep your bowels regular. Let us  know if you lose more weight without trying. 7. Other Health Conditions Keep taking your medicines for kidney problems, heart health, and other chronic issues as directed. Keep using your CPAP machine every night.  Your Medications Doxycycline : Fleurette the course as prescribed (3 more days). Take with food if it upsets  your stomach. Nasal spray: Use as needed for stuffy or runny nose. Other medicines: Keep taking all your regular medicines as prescribed. Do not start or stop any medicine without checking with us . If you have questions about your medicines or think you are having side effects, call our office.  Follow-up Care Chest x-ray: We will repeat your chest x-ray in 2-4 weeks to check that  your lungs are clear. Next appointment: We will contact you to schedule your follow-up visit, or you can call our office at [phone]. Vaccines: At your next visit, we will talk about vaccines to protect your lungs (like RSV, pneumonia, and flu shots).  When to Seek Immediate Care Call 911 or go to the emergency room if you: Have trouble breathing or feel like you cannot catch your breath Cough up blood Have chest pain that does not go away Feel confused, very weak, or pass out Have a fever over 101F that will not go down Cannot keep down food or water For non-urgent questions: Call our office at [phone], Monday-Friday, 9am-5pm After hours/weekends, call [phone] You can also send a message through MyChart  Please follow these instructions carefully to help your recovery. If you have any questions or concerns, contact us  right away.        NEXT STEPS: [x]  Early Intervention: Schedule sooner appointment, call our on-call services, or go to emergency room if there is any significant Increase in pain or discomfort New or worsening symptoms Sudden or severe changes in your health [x]  Flexible Follow-Up: We recommend a No follow-ups on file. for optimal routine care. This allows for progress monitoring and treatment adjustments. [x]  Preventive Care: Schedule your annual preventive care visit! It's typically covered by insurance and helps identify potential health issues early. [x]  Lab & X-ray Appointments: Incomplete tests scheduled today, or call to schedule. X-rays: [facility] (M-F, 8:30am-noon or 1pm-5pm). [x]  Medical Information Release: Sign a release form at front desk to obtain relevant medical information we don't have.  MAKING THE MOST OF OUR FOCUSED 20 MINUTE APPOINTMENTS: [x]   Clearly state your top concerns at the beginning of the visit to focus our discussion [x]   If you anticipate you will need more time, please inform the front desk during  scheduling - we can book multiple appointments in the same week. [x]   If you have transportation problems- use our convenient video appointments or ask about transportation support. [x]   We can get down to business faster if you use MyChart to update information before the visit and submit non-urgent questions before your visit. Thank you for taking the time to provide details through MyChart.  Let our nurse know and she can import this information into your encounter documents.  Arrival and Wait Times: [x]   Arriving on time ensures that everyone receives prompt attention. [x]   Early morning (8a) and afternoon (1p) appointments tend to have shortest wait times. [x]   Unfortunately, we cannot delay appointments for late arrivals or hold slots during phone calls.  Getting Answers and Following Up [x]   Simple Questions & Concerns: For quick questions or basic follow-up after your visit, reach us  at [phone] or MyChart messaging. [x]   Complex Concerns: If your concern is more complex, scheduling an appointment might be best. Discuss this with the staff to find the most suitable option. [x]   Lab & Imaging Results: We'll contact you directly if results are abnormal or you don't use MyChart. Most normal results will be on MyChart within 2-3  business days, with a review message from Dr. Jesus. Haven't heard back in 2 weeks? Need results sooner? Contact us  at [phone]. [x]   Referrals: Our referral coordinator will manage specialist referrals. The specialist's office should contact you within 2 weeks to schedule an appointment. Call us  if you haven't heard from them after 2 weeks.  Staying Connected [x]   MyChart: Activate your MyChart for the fastest way to access results and message us . See the last page of this paperwork for instructions on how to activate.  Bring to Your Next Appointment [x]   Medications: Please bring all your medication bottles to your next appointment to ensure we have an  accurate record of your prescriptions. [x]   Health Diaries: If you're monitoring any health conditions at home, keeping a diary of your readings can be very helpful for discussions at your next appointment.  Billing [x]   X-ray & Lab Orders: These are billed by separate companies. Contact the invoicing company directly for questions or concerns. [x]   Visit Charges: Discuss any billing inquiries with our administrative services team.  Your Satisfaction Matters [x]   Share Your Experience: We strive for your satisfaction! If you have any complaints, or preferably compliments, please let Dr. Jesus know directly or contact our Practice Administrators, Manuelita Rubin or [employer], by asking at the front desk.   Reviewing Your Records [x]   Review this early draft of your clinical encounter notes below and the final encounter summary tomorrow on MyChart after its been completed.  All orders placed so far are visible here: Pneumonia due to infectious organism, unspecified laterality, unspecified part of lung -     DG Chest 2 View; Future      ALLERGY MANAGEMENT PLAN  This plan is designed to help manage your allergic rhinitis (nasal allergies) effectively. Follow these steps daily for best results.  Sinus saline sprays- use nightly, and after sneezing episodes or exposure to allergen.  Insert deeply and spray mist into nose while leaning over sink at 45 degrees,  while gently breathing. Also blow out onto tissue while leaning forward 45 degrees. Once daily, after a sinus rinse, use sensimist.  Just before bedtime is best. This only needed if allergies acting up.  If this is inadequate add-on once daily for levocetirizine / xyzal 5 mg for nondrowsy antihistamine Take benadryl 25 mg at bedtime also if allergic mucus is persisting  When allergies cause chronic swelling in sinuses, it leads to sinus infections:    DAILY TREATMENT ROUTINE   Time of Day Treatment Steps  Morning 1. Saline  Nasal Spray - Use to cleanse nasal passages 2. Xyzal (levocetirizine) - Take one tablet daily   Throughout Day Saline Nasal Spray - Use 2 additional times (mid-day and afternoon)   Evening/Bedtime 1. Saline Nasal Rinse - Thoroughly clean nasal passages 2. Flonase  Sensimist - Apply after nasal rinse 3. Benadryl (diphenhydramine) - Take 25mg  if experiencing persistent congestion    PROPER TECHNIQUE GUIDE       Saline Nasal Spray/Rinse Technique: Lean forward over sink at a 45-degree angle Turn head slightly to one side Insert spray tip into upper nostril Spray gently while breathing lightly through your nose Repeat on other side Gently blow nose to clear excess solution Use saline spray 3 times daily to keep nasal passages moist and clear allergens.       Flonase  Sensimist Technique: Shake bottle gently before each use Prime the bottle if it's new or hasn't been used for a week Tilt your head  forward slightly Insert tip into nostril, pointing away from the center of your nose Spray while inhaling gently Repeat in other nostril Use Flonase  Sensimist once daily, preferably at bedtime after using saline rinse. It may take several days of regular use to feel maximum benefit.   WHY FLONASE  SENSIMIST?   Benefits of Flonase  Sensimist:  Alcohol-free and scent-free formula - gentler on sensitive nasal passages Fine mist application - more comfortable with less dripping down throat Effectively relieves nasal congestion, sneezing, runny nose, and even eye symptoms 24-hour relief with once-daily dosing Uses a more potent form of fluticasone  that works at a lower dose Less liquid per spray means less discomfort  UNDERSTANDING YOUR MEDICATIONS   Medication How It Works Important Notes  Flonase  Sensimist (fluticasone  furoate) Reduces inflammation in nasal passages, addressing the underlying cause of allergy symptoms - Takes several days for full effect - Use daily for best results - Safe  for long-term use   Xyzal (levocetirizine) Blocks histamine to reduce allergy symptoms like sneezing and itching - Take at the same time each day - May cause drowsiness in some people - Once-daily dosing   Benadryl (diphenhydramine) Antihistamine that provides additional relief for breakthrough symptoms - Causes drowsiness - Use only at bedtime - For occasional use when needed   Saline Spray/Rinse Physically removes allergens and moistens nasal passages - Safe to use frequently - Improves effectiveness of other treatments - Reduces nasal irritation    CONTACT YOUR PROVIDER IF: Your symptoms do not improve after 1-2 weeks of following this plan You develop sinus pain with fever or green/yellow discharge You experience frequent nosebleeds You develop new or worsening symptoms You have questions about your treatment plan     ADDITIONAL ALLERGY MANAGEMENT TIPS   HELPFUL STRATEGIES: ?? Keep windows closed during high pollen seasons ??? Use allergen-proof covers for pillows and mattresses ?? Vacuum regularly with a HEPA filter vacuum ?? Shower and change clothes after spending time outdoors ?? Check local pollen counts and limit outdoor time when counts are high ?? Stay well-hydrated to help keep mucous membranes moist

## 2023-10-18 ENCOUNTER — Other Ambulatory Visit (HOSPITAL_BASED_OUTPATIENT_CLINIC_OR_DEPARTMENT_OTHER): Payer: Self-pay

## 2023-10-30 ENCOUNTER — Other Ambulatory Visit (HOSPITAL_BASED_OUTPATIENT_CLINIC_OR_DEPARTMENT_OTHER): Payer: Self-pay

## 2023-10-31 ENCOUNTER — Other Ambulatory Visit (HOSPITAL_BASED_OUTPATIENT_CLINIC_OR_DEPARTMENT_OTHER): Payer: Self-pay

## 2023-10-31 ENCOUNTER — Other Ambulatory Visit: Payer: Self-pay

## 2023-11-01 ENCOUNTER — Other Ambulatory Visit: Payer: Self-pay

## 2023-11-04 ENCOUNTER — Other Ambulatory Visit: Payer: Self-pay

## 2023-11-04 ENCOUNTER — Other Ambulatory Visit (HOSPITAL_BASED_OUTPATIENT_CLINIC_OR_DEPARTMENT_OTHER): Payer: Self-pay

## 2023-11-04 DIAGNOSIS — M25562 Pain in left knee: Secondary | ICD-10-CM | POA: Diagnosis not present

## 2023-11-04 DIAGNOSIS — G8929 Other chronic pain: Secondary | ICD-10-CM | POA: Diagnosis not present

## 2023-11-04 DIAGNOSIS — M25561 Pain in right knee: Secondary | ICD-10-CM | POA: Diagnosis not present

## 2023-11-04 MED ORDER — PREDNISONE 10 MG PO TABS
ORAL_TABLET | ORAL | 0 refills | Status: AC
  Filled 2023-11-04: qty 21, 12d supply, fill #0

## 2023-11-05 ENCOUNTER — Other Ambulatory Visit: Payer: Self-pay

## 2023-11-06 ENCOUNTER — Other Ambulatory Visit: Payer: Self-pay

## 2023-11-06 ENCOUNTER — Other Ambulatory Visit (HOSPITAL_BASED_OUTPATIENT_CLINIC_OR_DEPARTMENT_OTHER): Payer: Self-pay

## 2023-11-07 ENCOUNTER — Other Ambulatory Visit (HOSPITAL_BASED_OUTPATIENT_CLINIC_OR_DEPARTMENT_OTHER): Payer: Self-pay

## 2023-11-08 ENCOUNTER — Other Ambulatory Visit: Payer: Self-pay

## 2023-11-09 ENCOUNTER — Other Ambulatory Visit (HOSPITAL_BASED_OUTPATIENT_CLINIC_OR_DEPARTMENT_OTHER): Payer: Self-pay

## 2023-11-11 ENCOUNTER — Other Ambulatory Visit: Payer: Self-pay

## 2023-11-11 ENCOUNTER — Ambulatory Visit (INDEPENDENT_AMBULATORY_CARE_PROVIDER_SITE_OTHER): Admitting: Internal Medicine

## 2023-11-11 ENCOUNTER — Other Ambulatory Visit (HOSPITAL_BASED_OUTPATIENT_CLINIC_OR_DEPARTMENT_OTHER): Payer: Self-pay

## 2023-11-11 ENCOUNTER — Encounter: Payer: Self-pay | Admitting: Internal Medicine

## 2023-11-11 VITALS — BP 128/72 | HR 62 | Temp 98.0°F | Ht 70.5 in | Wt 173.6 lb

## 2023-11-11 DIAGNOSIS — Z79899 Other long term (current) drug therapy: Secondary | ICD-10-CM

## 2023-11-11 DIAGNOSIS — G4733 Obstructive sleep apnea (adult) (pediatric): Secondary | ICD-10-CM

## 2023-11-11 DIAGNOSIS — G629 Polyneuropathy, unspecified: Secondary | ICD-10-CM | POA: Diagnosis not present

## 2023-11-11 DIAGNOSIS — I700 Atherosclerosis of aorta: Secondary | ICD-10-CM | POA: Diagnosis not present

## 2023-11-11 DIAGNOSIS — M359 Systemic involvement of connective tissue, unspecified: Secondary | ICD-10-CM

## 2023-11-11 DIAGNOSIS — N182 Chronic kidney disease, stage 2 (mild): Secondary | ICD-10-CM | POA: Diagnosis not present

## 2023-11-11 DIAGNOSIS — M150 Primary generalized (osteo)arthritis: Secondary | ICD-10-CM

## 2023-11-11 DIAGNOSIS — G609 Hereditary and idiopathic neuropathy, unspecified: Secondary | ICD-10-CM | POA: Diagnosis not present

## 2023-11-11 DIAGNOSIS — K219 Gastro-esophageal reflux disease without esophagitis: Secondary | ICD-10-CM | POA: Diagnosis not present

## 2023-11-11 MED ORDER — CELECOXIB 100 MG PO CAPS
100.0000 mg | ORAL_CAPSULE | Freq: Two times a day (BID) | ORAL | 4 refills | Status: AC
  Filled 2023-11-11 (×2): qty 60, 30d supply, fill #0
  Filled 2023-11-11: qty 180, 90d supply, fill #0
  Filled 2023-11-27 – 2023-12-05 (×2): qty 60, 30d supply, fill #1
  Filled 2023-12-30: qty 60, 30d supply, fill #2
  Filled 2024-01-28: qty 180, 90d supply, fill #3
  Filled 2024-04-13: qty 180, 90d supply, fill #4

## 2023-11-11 MED ORDER — ATORVASTATIN CALCIUM 40 MG PO TABS
40.0000 mg | ORAL_TABLET | Freq: Every day | ORAL | 3 refills | Status: AC
  Filled 2023-11-11 – 2023-11-27 (×2): qty 90, 90d supply, fill #0
  Filled 2023-12-05: qty 30, 30d supply, fill #0
  Filled 2023-12-30: qty 30, 30d supply, fill #1
  Filled 2024-01-28: qty 90, 90d supply, fill #2
  Filled 2024-04-13: qty 90, 90d supply, fill #3

## 2023-11-11 MED ORDER — PANTOPRAZOLE SODIUM 20 MG PO TBEC
20.0000 mg | DELAYED_RELEASE_TABLET | Freq: Every day | ORAL | 4 refills | Status: AC
  Filled 2023-11-11: qty 30, 30d supply, fill #0
  Filled 2023-11-11 – 2023-11-27 (×2): qty 90, 90d supply, fill #0
  Filled 2023-12-05: qty 30, 30d supply, fill #0
  Filled 2023-12-30: qty 30, 30d supply, fill #1
  Filled 2024-01-28: qty 90, 90d supply, fill #2
  Filled 2024-04-13: qty 90, 90d supply, fill #3

## 2023-11-11 MED ORDER — PREDNISONE 20 MG PO TABS
ORAL_TABLET | ORAL | 0 refills | Status: AC
  Filled 2023-11-11: qty 10, 7d supply, fill #0

## 2023-11-11 NOTE — Assessment & Plan Note (Signed)
 Try to reduce proton pump inhibitor (PPI) stomach acid reducer Avoid NSAIDs Continue(s) with monitoring

## 2023-11-11 NOTE — Assessment & Plan Note (Signed)
 Continue(s) with atorvastatin  refilled

## 2023-11-11 NOTE — Assessment & Plan Note (Signed)
 Knee pain is managed with prednisone , which is effective but causes increased urination. He is on a tapering dose of prednisone  and uses Voltaren  cream for joint pain. Minimize celecoxib  use due to potential adverse effects and consider a step-down dose. Continue the current prednisone  tapering regimen and use Voltaren  cream as needed. Consider knee injections if oral medications become ineffective.

## 2023-11-11 NOTE — Assessment & Plan Note (Signed)
 He is on pantoprazole  for gastritis and hiatal hernia. Reduce the pantoprazole  dose to 20 mg and monitor tolerance.

## 2023-11-11 NOTE — Progress Notes (Signed)
 ==============================  [facility]: [phone]   -- Medical Office Visit --  Patient: Stephen Anderson      Age: 83 y.o.       Sex:  male  Date:   11/11/2023 Today's Healthcare Provider: Bernardino KANDICE Cone, MD  ==============================   Chief Complaint: No chief complaint on file.  Discussed the use of AI scribe software for clinical note transcription with the patient, who gave verbal consent to proceed.  History of Present Illness 83 year old male who presents for medication management and follow-up for knee pain and neuropathy.  He has been experiencing knee pain and was initially prescribed a medication regimen involving three pills a day for two days, followed by two pills a day for five days. During the initial days of the medication, he experienced increased urination but noted improvement in knee pain. He has been taking prednisone  for his knee pain, and he reports that it seemed to help his knees.  He is managing neuropathy, primarily in his feet and legs, with pregabalin . The steroid treatment for his knees also seemed to alleviate some of his neuropathy symptoms. He uses a topical cream from [place] and Voltaren  cream for additional relief.  He has a history of gastritis and a hiatal hernia, for which he takes pantoprazole  40 mg daily. No indigestion is reported as long as he takes his medication.  He takes several vitamins, including vitamin C, B12, and D3. He uses a CPAP machine nightly for sleep apnea and reports increased water usage in the machine, which he suspects may contribute to frequent urination.  He is planning a road trip to [place]  with his partner, Janie, and has purchased a Electronics engineer for the journey. He is concerned about managing his medications during extended travel and has discussed obtaining a 90-day supply of his medications. Background Reviewed: Problem List: has Essential tremor; Idiopathic peripheral  neuropathy; OSA on CPAP; GERD (gastroesophageal reflux disease); Mixed hyperlipidemia; Osteoarthritis, multiple sites; History of melanoma; Insomnia; B12 deficiency; RLS (restless legs syndrome); Chronic fatigue; Arthritis of knee, right; Pain in joint of right shoulder; Chest discomfort; Former smoker; High risk medication use; Kidney cyst, acquired; Fatty liver; Thromboangiitis obliterans (Buerger's disease) (HCC); RUQ abdominal pain; 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, not believe to be due to lupus or systemic sclerosis; Diverticular disease; Right inguinal hernia; Aortic atherosclerosis (HCC); Bronchiectasis (HCC); Recurrent kidney stones; Calcification of prostate; DDD (degenerative disc disease), lumbar; LVH (left ventricular hypertrophy); Grade I diastolic dysfunction; Weight loss; Gastritis; Hiatal hernia; Lumbar back pain; Arthritis of both hands; Trigger finger, right ring finger; Chronic kidney disease, stage 2 (mild); Prediabetes; 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 Past Surgical History:   has a past surgical history that includes Gallbladder surgery; Hernia repair; Cataract extraction, bilateral; and  Melanoma excision. Social History:   reports that he quit smoking about 15 years ago. His smoking use included cigarettes. He started smoking about 65 years ago. He has a 50 pack-year smoking history. He has never been exposed to tobacco smoke. He quit smokeless tobacco use about 21 years ago. He reports that he does not drink alcohol and does not use drugs. Family History:  family history includes Bladder Cancer in his brother; Heart disease in his brother; Lung cancer in his father; Neuropathy in his daughter. Allergies:  is allergic to hydrocodone, oxycodone, oxycontin [oxycodone hcl], and simvastatin.   Medication Reconciliation: Current Outpatient Medications on File Prior to Visit  Medication Sig   Acetaminophen (TYLENOL ARTHRITIS EXT RELIEF PO) Take 1 tablet by mouth every other day.   Alpha-Lipoic Acid 300 MG TABS Take 2 tablets (600 mg total) by mouth daily at 6 (six) AM.   Ascorbic Acid (VITAMIN C PO) Take 500 mg by mouth daily.   aspirin 81 MG tablet Take 81 mg by mouth every other day.    caffeine 200 MG TABS tablet Take 200 mg by mouth in the morning. STAY AWAKE   Cholecalciferol  (VITAMIN D -3) 125 MCG (5000 UT) TABS Take 1 tablet by mouth daily.   cilostazol  (PLETAL ) 50 MG tablet Take 1 tablet (50 mg total) by mouth 2 (two) times daily.   Cyanocobalamin  (B-12 PO) Take 1 tablet by mouth daily.   diclofenac  Sodium (VOLTAREN ) 1 % GEL Apply 2 g topically in the morning and at bedtime.   fluticasone  (FLONASE ) 50 MCG/ACT nasal spray Place 1 spray into both nostrils daily as needed.   ketoconazole  (NIZORAL ) 2 % shampoo Apply to the affected area(s) and let sit 3-5 minutes before rinsing.   lidocaine  (XYLOCAINE ) 5 % ointment Apply to the affected area(s) topically as needed.    NIFEdipine  (PROCARDIA -XL/NIFEDICAL-XL) 30 MG 24 hr tablet Take 1 tablet (30 mg total) by mouth daily.   NON FORMULARY CPAP   omega-3 acid ethyl esters (LOVAZA ) 1 g capsule Take 2 capsules (2 g total) by mouth 2 (two) times daily.   predniSONE  (DELTASONE ) 10 MG tablet Take 1 tablet (10 mg total) 3 (three) times daily for 2 days, THEN 1 tablet (10 mg total) 2 (two) times daily for 5 days, THEN 1 tablet (10 mg total) daily for 5 days until finished.   pregabalin  (LYRICA ) 200 MG capsule Take 1 capsule (200 mg total) by mouth 2 (two) times daily.   primidone  (MYSOLINE ) 50 MG tablet Take 1 tablet (50 mg total) by mouth 2 (two) times daily in morning and evening.   triamcinolone  cream (KENALOG ) 0.1 % Apply to the affected area(s) topically 2 (two) times daily. Use for up to 14 days.   No current facility-administered medications on file prior to visit.   Medications Discontinued During This Encounter  Medication Reason   tirzepatide  (ZEPBOUND ) 2.5 MG/0.5ML Pen    doxycycline  (VIBRA -TABS) 100 MG tablet    pantoprazole  (PROTONIX ) 40 MG tablet    celecoxib  (CELEBREX ) 200 MG capsule Dose change   atorvastatin  (LIPITOR) 40 MG tablet Reorder    Physical Exam:    11/11/2023    8:51 AM 10/15/2023    8:29 AM 10/07/2023    2:36 PM  Vitals with BMI  Height 5' 10.5 5' 10.5 5' 10.5  Weight 173 lbs 10 oz 167 lbs 10 oz 166 lbs 10 oz  BMI 24.55 23.7 23.56  Systolic 128 100 889  Diastolic 72 60 64  Pulse 62  68 78  Vital signs reviewed.  Nursing notes reviewed. Weight trend reviewed. Physical Exam General Appearance:  No acute distress appreciable.   Well-groomed, healthy-appearing male.  Well proportioned with no abnormal fat distribution.  Good muscle tone. Pulmonary:  Normal work of breathing at rest, no respiratory distress apparent. SpO2: 98 %  Musculoskeletal: All extremities are intact.  Neurological:  Awake, alert, oriented, and engaged.  No obvious focal neurological deficits or cognitive  impairments.  Sensorium seems unclouded.   Speech is clear and coherent with logical content. Psychiatric:  Appropriate mood, pleasant and cooperative demeanor, thoughtful and engaged during the exam   Results:    05/17/2023    8:48 AM 05/01/2023    8:35 AM 03/27/2023    8:50 AM 01/16/2023    8:45 AM  PHQ 2/9 Scores  PHQ - 2 Score 0 0 0 1  PHQ- 9 Score  2 1 4    Results LABS Antinuclear antibody (ANA): Positive    No results found for any visits on 11/11/23. Office Visit on 10/07/2023  Component Date Value Ref Range Status   WBC 10/07/2023 8.0  4.0 - 10.5 K/uL Final   RBC 10/07/2023 4.90  4.22 - 5.81 Mil/uL Final   Hemoglobin 10/07/2023 14.8  13.0 - 17.0 g/dL Final   HCT 93/69/7974 44.0  39.0 - 52.0 % Final   MCV 10/07/2023 89.9  78.0 - 100.0 fl Final   MCHC 10/07/2023 33.6  30.0 - 36.0 g/dL Final   RDW 93/69/7974 14.1  11.5 - 15.5 % Final   Platelets 10/07/2023 227.0  150.0 - 400.0 K/uL Final   Neutrophils Relative % 10/07/2023 58.8  43.0 - 77.0 % Final   Lymphocytes Relative 10/07/2023 27.5  12.0 - 46.0 % Final   Monocytes Relative 10/07/2023 10.4  3.0 - 12.0 % Final   Eosinophils Relative 10/07/2023 2.2  0.0 - 5.0 % Final   Basophils Relative 10/07/2023 1.1  0.0 - 3.0 % Final   Neutro Abs 10/07/2023 4.7  1.4 - 7.7 K/uL Final   Lymphs Abs 10/07/2023 2.2  0.7 - 4.0 K/uL Final   Monocytes Absolute 10/07/2023 0.8  0.1 - 1.0 K/uL Final   Eosinophils Absolute 10/07/2023 0.2  0.0 - 0.7 K/uL Final   Basophils Absolute 10/07/2023 0.1  0.0 - 0.1 K/uL Final   Sodium 10/07/2023 142  135 - 145 mEq/L Final   Potassium 10/07/2023 4.1  3.5 - 5.1 mEq/L Final   Chloride 10/07/2023 104  96 - 112 mEq/L Final   CO2 10/07/2023 26  19 - 32 mEq/L Final   Glucose, Bld 10/07/2023 94  70 - 99 mg/dL Final   BUN 93/69/7974 31 (H)  6 - 23 mg/dL Final   Creatinine, Ser 10/07/2023 1.49  0.40 - 1.50 mg/dL Final   Total Bilirubin 10/07/2023 0.5  0.2 - 1.2 mg/dL Final   Alkaline Phosphatase 10/07/2023  82  39 - 117 U/L Final   AST 10/07/2023 26  0 - 37 U/L Final   ALT 10/07/2023 40  0 - 53 U/L Final   Total Protein 10/07/2023 7.1  6.0 - 8.3 g/dL Final   Albumin 93/69/7974 4.6  3.5 - 5.2 g/dL Final   GFR 93/69/7974 43.80 (L)  >60.00 mL/min Final   Calcium  10/07/2023 9.4  8.4 - 10.5 mg/dL Final   Sed Rate 93/69/7974 7  0 - 20 mm/hr Final   Pro B Natriuretic peptide (BNP) 10/07/2023 12.0  0.0 - 100.0 pg/mL Final   Vitamin B-12 10/07/2023 1,138 (  H)  211 - 911 pg/mL Final   Folate 10/07/2023 >23.2  >5.9 ng/mL Final   Rheumatoid fact SerPl-aCnc 10/07/2023 <10  <14 IU/mL Final   Anti Nuclear Antibody (ANA) 10/07/2023 POSITIVE (A)  NEGATIVE Final   ANA Titer 1 10/07/2023 1:40 (H)  titer Final   ANA Pattern 1 10/07/2023 Nuclear, Homogeneous (A)   Final  Office Visit on 08/21/2023  Component Date Value Ref Range Status   MICRO NUMBER: 08/21/2023 [id number]   Final   SPECIMEN QUALITY: 08/21/2023 Adequate   Final   Source: 08/21/2023 NOT GIVEN   Final   STATUS: 08/21/2023 FINAL   Final   GRAM STAIN: 08/21/2023 No epithelial cells seen No white blood cells seen No organisms seen   Final   ANA RESULT: 08/21/2023 No anaerobes isolated.   Final   MICRO NUMBER: 08/21/2023 [id number]   Final   SPECIMEN QUALITY: 08/21/2023 Adequate   Final   SOURCE: 08/21/2023 NOT GIVEN   Final   STATUS: 08/21/2023 FINAL   Final   AER RESULT: 08/21/2023 No Growth   Final   COMMENT: 08/21/2023 No source was provided. The specimen was tested and reported based upon the test code ordered. If this is incorrect, please contact client services.   Final  Office Visit on 08/14/2023  Component Date Value Ref Range Status   WBC 08/14/2023 11.4 (H)  4.0 - 10.5 K/uL Final   RBC 08/14/2023 4.23  4.22 - 5.81 Mil/uL Final   Hemoglobin 08/14/2023 13.0  13.0 - 17.0 g/dL Final   HCT 94/92/7974 38.3 (L)  39.0 - 52.0 % Final   MCV 08/14/2023 90.5  78.0 - 100.0 fl Final   MCHC 08/14/2023 33.9  30.0 - 36.0 g/dL Final   RDW 94/92/7974 13.9   11.5 - 15.5 % Final   Platelets 08/14/2023 228.0  150.0 - 400.0 K/uL Final   Neutrophils Relative % 08/14/2023 79.4 (H)  43.0 - 77.0 % Final   Lymphocytes Relative 08/14/2023 13.4  12.0 - 46.0 % Final   Monocytes Relative 08/14/2023 6.2  3.0 - 12.0 % Final   Eosinophils Relative 08/14/2023 0.2  0.0 - 5.0 % Final   Basophils Relative 08/14/2023 0.8  0.0 - 3.0 % Final   Neutro Abs 08/14/2023 9.0 (H)  1.4 - 7.7 K/uL Final   Lymphs Abs 08/14/2023 1.5  0.7 - 4.0 K/uL Final   Monocytes Absolute 08/14/2023 0.7  0.1 - 1.0 K/uL Final   Eosinophils Absolute 08/14/2023 0.0  0.0 - 0.7 K/uL Final   Basophils Absolute 08/14/2023 0.1  0.0 - 0.1 K/uL Final   Sodium 08/14/2023 137  135 - 145 mEq/L Final   Potassium 08/14/2023 4.8  3.5 - 5.1 mEq/L Final   Chloride 08/14/2023 105  96 - 112 mEq/L Final   CO2 08/14/2023 24  19 - 32 mEq/L Final   Glucose, Bld 08/14/2023 119 (H)  70 - 99 mg/dL Final   BUN 94/92/7974 27 (H)  6 - 23 mg/dL Final   Creatinine, Ser 08/14/2023 1.11  0.40 - 1.50 mg/dL Final   Total Bilirubin 08/14/2023 0.2  0.2 - 1.2 mg/dL Final   Alkaline Phosphatase 08/14/2023 74  39 - 117 U/L Final   AST 08/14/2023 28  0 - 37 U/L Final   ALT 08/14/2023 38  0 - 53 U/L Final   Total Protein 08/14/2023 7.0  6.0 - 8.3 g/dL Final   Albumin 94/92/7974 3.7  3.5 - 5.2 g/dL Final   GFR 94/92/7974 62.43  >60.00 mL/min  Final   Calcium  08/14/2023 8.7  8.4 - 10.5 mg/dL Final   Pro B Natriuretic peptide (BNP) 08/14/2023 24.0  0.0 - 100.0 pg/mL Final   CRP 08/14/2023 4.0  0.5 - 20.0 mg/dL Final   Sed Rate 94/92/7974 44 (H)  0 - 20 mm/hr Final   Vitamin B-12 08/14/2023 634  211 - 911 pg/mL Final   Folate 08/14/2023 13.1  >5.9 ng/mL Final   SARS Coronavirus 2 Ag 08/14/2023 Negative  Negative Final  Office Visit on 07/31/2023  Component Date Value Ref Range Status   WBC 07/31/2023 7.0  4.0 - 10.5 K/uL Final   RBC 07/31/2023 4.75  4.22 - 5.81 Mil/uL Final   Hemoglobin 07/31/2023 14.5  13.0 - 17.0 g/dL Final    HCT 95/76/7974 43.5  39.0 - 52.0 % Final   MCV 07/31/2023 91.7  78.0 - 100.0 fl Final   MCHC 07/31/2023 33.3  30.0 - 36.0 g/dL Final   RDW 95/76/7974 14.0  11.5 - 15.5 % Final   Platelets 07/31/2023 167.0  150.0 - 400.0 K/uL Final   Neutrophils Relative % 07/31/2023 64.7  43.0 - 77.0 % Final   Lymphocytes Relative 07/31/2023 24.9  12.0 - 46.0 % Final   Monocytes Relative 07/31/2023 7.5  3.0 - 12.0 % Final   Eosinophils Relative 07/31/2023 2.3  0.0 - 5.0 % Final   Basophils Relative 07/31/2023 0.6  0.0 - 3.0 % Final   Neutro Abs 07/31/2023 4.5  1.4 - 7.7 K/uL Final   Lymphs Abs 07/31/2023 1.7  0.7 - 4.0 K/uL Final   Monocytes Absolute 07/31/2023 0.5  0.1 - 1.0 K/uL Final   Eosinophils Absolute 07/31/2023 0.2  0.0 - 0.7 K/uL Final   Basophils Absolute 07/31/2023 0.0  0.0 - 0.1 K/uL Final   Sodium 07/31/2023 138  135 - 145 mEq/L Final   Potassium 07/31/2023 4.2  3.5 - 5.1 mEq/L Final   Chloride 07/31/2023 104  96 - 112 mEq/L Final   CO2 07/31/2023 28  19 - 32 mEq/L Final   Glucose, Bld 07/31/2023 101 (H)  70 - 99 mg/dL Final   BUN 95/76/7974 27 (H)  6 - 23 mg/dL Final   Creatinine, Ser 07/31/2023 1.18  0.40 - 1.50 mg/dL Final   Total Bilirubin 07/31/2023 0.6  0.2 - 1.2 mg/dL Final   Alkaline Phosphatase 07/31/2023 59  39 - 117 U/L Final   AST 07/31/2023 16  0 - 37 U/L Final   ALT 07/31/2023 14  0 - 53 U/L Final   Total Protein 07/31/2023 6.7  6.0 - 8.3 g/dL Final   Albumin 95/76/7974 4.4  3.5 - 5.2 g/dL Final   GFR 95/76/7974 58.03 (L)  >60.00 mL/min Final   Calcium  07/31/2023 8.8  8.4 - 10.5 mg/dL Final   Phosphorus 95/76/7974 2.6  2.3 - 4.6 mg/dL Final   VITD 95/76/7974 47.95  30.00 - 100.00 ng/mL Final   PTH 07/31/2023 49  16 - 77 pg/mL Final   Uric Acid, Serum 07/31/2023 5.8  4.0 - 7.8 mg/dL Final   Color, Urine 95/76/7974 YELLOW  YELLOW Final   APPearance 07/31/2023 CLEAR  CLEAR Final   Specific Gravity, Urine 07/31/2023 1.019  1.001 - 1.035 Final   pH 07/31/2023 6.0  5.0 -  8.0 Final   Glucose, UA 07/31/2023 NEGATIVE  NEGATIVE Final   Bilirubin Urine 07/31/2023 NEGATIVE  NEGATIVE Final   Ketones, ur 07/31/2023 NEGATIVE  NEGATIVE Final   Hgb urine dipstick 07/31/2023 NEGATIVE  NEGATIVE Final  Protein, ur 07/31/2023 NEGATIVE  NEGATIVE Final   Nitrites, Initial 07/31/2023 NEGATIVE  NEGATIVE Final   Leukocyte Esterase 07/31/2023 NEGATIVE  NEGATIVE Final   WBC, UA 07/31/2023 NONE SEEN  0 - 5 /HPF Final   RBC / HPF 07/31/2023 NONE SEEN  0 - 2 /HPF Final   Squamous Epithelial / HPF 07/31/2023 NONE SEEN  < OR = 5 /HPF Final   Bacteria, UA 07/31/2023 NONE SEEN  NONE SEEN /HPF Final   Hyaline Cast 07/31/2023 NONE SEEN  NONE SEEN /LPF Final   Note 07/31/2023    Final   Vitamin B-12 07/31/2023 483  211 - 911 pg/mL Final   Methylmalonic Acid, Quant 07/31/2023 106  85 - 423 nmol/L Final   Ceruloplasmin 07/31/2023 20  14 - 30 mg/dL Final   Zinc  07/31/2023 60  60 - 130 mcg/dL Final   Total Protein 95/76/7974 6.4  6.1 - 8.1 g/dL Final   Albumin ELP 95/76/7974 4.1  3.8 - 4.8 g/dL Final   Alpha 1 95/76/7974 0.2  0.2 - 0.3 g/dL Final   Alpha 2 95/76/7974 0.7  0.5 - 0.9 g/dL Final   Beta Globulin 95/76/7974 0.4  0.4 - 0.6 g/dL Final   Beta 2 95/76/7974 0.3  0.2 - 0.5 g/dL Final   Gamma Globulin 07/31/2023 0.7 (L)  0.8 - 1.7 g/dL Final   Abnormal Protein Band1 07/31/2023   NONE DETECTED g/dL Final   SPE Interp. 95/76/7974    Final   Protein, Ur 08/02/2023 9.3  Not Estab. mg/dL Final   Protein, 75Y Urine 08/02/2023 Comment  30 - 150 mg/24 hr Final   ALBUMIN, U 08/02/2023 100.0  % Final   ALPHA 1 URINE 08/02/2023 0.0  % Final   ALPHA-2-GLOBULIN, U 08/02/2023 0.0  % Final   % BETA, Urine 08/02/2023 0.0  % Final   GAMMA GLOBULIN URINE 08/02/2023 0.0  % Final   M-SPIKE, % 08/02/2023 Not Observed  Not Observed % Final   M-Spike, mg/24 hr 08/02/2023 Comment  Not Observed mg/24 hr Final   Immunofixation, Urine 08/02/2023 Comment   Final   NOTE: 08/02/2023 Comment   Final    Free Kappa Lt Chains,Ur 08/02/2023 45.86  1.17 - 86.46 mg/L Final   Free Lambda Lt Chains,Ur 08/02/2023 8.15  0.27 - 15.21 mg/L Final   Kappa/Lambda Ratio,U 08/02/2023 5.63  1.83 - 14.26 Final   ANCA SCREEN 07/31/2023 Negative  Negative Final   Sed Rate 07/31/2023 4  0 - 20 mm/hr Final   CRP 07/31/2023 <1.0  0.5 - 20.0 mg/dL Final   Reflexve Urine Culture 07/31/2023    Final  Office Visit on 03/27/2023  Component Date Value Ref Range Status   Vitamin B-12 03/27/2023 1,492 (H)  211 - 911 pg/mL Final   Folate 03/27/2023 16.0  >5.9 ng/mL Final   Hgb A1c MFr Bld 03/27/2023 5.9  4.6 - 6.5 % Final   Ferritin 03/27/2023 53.8  22.0 - 322.0 ng/mL Final   Sodium 03/27/2023 142  135 - 145 mEq/L Final   Potassium 03/27/2023 4.3  3.5 - 5.1 mEq/L Final   Chloride 03/27/2023 105  96 - 112 mEq/L Final   CO2 03/27/2023 30  19 - 32 mEq/L Final   Glucose, Bld 03/27/2023 109 (H)  70 - 99 mg/dL Final   BUN 87/81/7975 21  6 - 23 mg/dL Final   Creatinine, Ser 03/27/2023 1.03  0.40 - 1.50 mg/dL Final   Total Bilirubin 03/27/2023 0.5  0.2 - 1.2 mg/dL  Final   Alkaline Phosphatase 03/27/2023 68  39 - 117 U/L Final   AST 03/27/2023 17  0 - 37 U/L Final   ALT 03/27/2023 14  0 - 53 U/L Final   Total Protein 03/27/2023 6.8  6.0 - 8.3 g/dL Final   Albumin 87/81/7975 4.5  3.5 - 5.2 g/dL Final   GFR 87/81/7975 68.48  >60.00 mL/min Final   Calcium  03/27/2023 8.9  8.4 - 10.5 mg/dL Final   WBC 87/81/7975 6.3  4.0 - 10.5 K/uL Final   RBC 03/27/2023 4.74  4.22 - 5.81 Mil/uL Final   Hemoglobin 03/27/2023 14.6  13.0 - 17.0 g/dL Final   HCT 87/81/7975 43.8  39.0 - 52.0 % Final   MCV 03/27/2023 92.3  78.0 - 100.0 fl Final   MCHC 03/27/2023 33.3  30.0 - 36.0 g/dL Final   RDW 87/81/7975 14.0  11.5 - 15.5 % Final   Platelets 03/27/2023 185.0  150.0 - 400.0 K/uL Final   Neutrophils Relative % 03/27/2023 60.8  43.0 - 77.0 % Final   Lymphocytes Relative 03/27/2023 27.2  12.0 - 46.0 % Final   Monocytes Relative 03/27/2023 9.0   3.0 - 12.0 % Final   Eosinophils Relative 03/27/2023 2.3  0.0 - 5.0 % Final   Basophils Relative 03/27/2023 0.7  0.0 - 3.0 % Final   Neutro Abs 03/27/2023 3.8  1.4 - 7.7 K/uL Final   Lymphs Abs 03/27/2023 1.7  0.7 - 4.0 K/uL Final   Monocytes Absolute 03/27/2023 0.6  0.1 - 1.0 K/uL Final   Eosinophils Absolute 03/27/2023 0.1  0.0 - 0.7 K/uL Final   Basophils Absolute 03/27/2023 0.0  0.0 - 0.1 K/uL Final   Cholesterol 03/27/2023 123  0 - 200 mg/dL Final   Triglycerides 87/81/7975 81.0  0.0 - 149.0 mg/dL Final   HDL 87/81/7975 41.00  >39.00 mg/dL Final   VLDL 87/81/7975 16.2  0.0 - 40.0 mg/dL Final   LDL Cholesterol 03/27/2023 65  0 - 99 mg/dL Final   Total CHOL/HDL Ratio 03/27/2023 3   Final   NonHDL 03/27/2023 81.51   Final   VITD 03/27/2023 34.36  30.00 - 100.00 ng/mL Final   Anti Nuclear Antibody (ANA) 03/27/2023 Negative  Negative Final   Rheumatoid fact SerPl-aCnc 03/27/2023 <10  <14 IU/mL Final   T3 Uptake 03/27/2023 28  22 - 35 % Final   T4, Total 03/27/2023 7.5  4.9 - 10.5 mcg/dL Final   Free Thyroxine Index 03/27/2023 2.1  1.4 - 3.8 Final   TSH 03/27/2023 1.05  0.40 - 4.50 mIU/L Final   Arsenic, 24H Ur 04/04/2023 <10  <=80 mcg/L Final   Lead, 24 hr urine 04/04/2023 <10  <80 mcg/L Final   Mercury, 24H Ur 04/04/2023 <4  <=20 mcg/L Final  Office Visit on 09/24/2022  Component Date Value Ref Range Status   Rheumatoid fact SerPl-aCnc 09/24/2022 <10  <14 IU/mL Final   Sed Rate 09/24/2022 2  0 - 20 mm/h Final   Scleroderma (Scl-70) (ENA) Antibod* 09/24/2022 <1.0 NEG  <1.0 NEG AI Final   Ribonucleic Protein(ENA) Antibody,* 09/24/2022 <1.0 NEG  <1.0 NEG AI Final   ENA SM Ab Ser-aCnc 09/24/2022 <1.0 NEG  <1.0 NEG AI Final   SSA (Ro) (ENA) Antibody, IgG 09/24/2022 <1.0 NEG  <1.0 NEG AI Final   ds DNA Ab 09/24/2022 1  IU/mL Final   C3 Complement 09/24/2022 124  82 - 185 mg/dL Final   C4 Complement 93/82/7975 21  15 - 53 mg/dL Final  Office Visit on 09/05/2022  Component Date Value  Ref Range Status   WBC 09/05/2022 6.9  4.0 - 10.5 K/uL Final   RBC 09/05/2022 4.71  4.22 - 5.81 Mil/uL Final   Hemoglobin 09/05/2022 14.1  13.0 - 17.0 g/dL Final   HCT 94/70/7975 42.4  39.0 - 52.0 % Final   MCV 09/05/2022 90.2  78.0 - 100.0 fl Final   MCHC 09/05/2022 33.2  30.0 - 36.0 g/dL Final   RDW 94/70/7975 14.9  11.5 - 15.5 % Final   Platelets 09/05/2022 158.0  150.0 - 400.0 K/uL Final   Neutrophils Relative % 09/05/2022 62.2  43.0 - 77.0 % Final   Lymphocytes Relative 09/05/2022 25.1  12.0 - 46.0 % Final   Monocytes Relative 09/05/2022 9.9  3.0 - 12.0 % Final   Eosinophils Relative 09/05/2022 2.2  0.0 - 5.0 % Final   Basophils Relative 09/05/2022 0.6  0.0 - 3.0 % Final   Neutro Abs 09/05/2022 4.3  1.4 - 7.7 K/uL Final   Lymphs Abs 09/05/2022 1.7  0.7 - 4.0 K/uL Final   Monocytes Absolute 09/05/2022 0.7  0.1 - 1.0 K/uL Final   Eosinophils Absolute 09/05/2022 0.2  0.0 - 0.7 K/uL Final   Basophils Absolute 09/05/2022 0.0  0.0 - 0.1 K/uL Final   Ferritin 09/05/2022 29.4  22.0 - 322.0 ng/mL Final   Cholesterol 09/05/2022 123  0 - 200 mg/dL Final   Triglycerides 94/70/7975 160.0 (H)  0.0 - 149.0 mg/dL Final   HDL 94/70/7975 34.60 (L)  >60.99 mg/dL Final   VLDL 94/70/7975 32.0  0.0 - 40.0 mg/dL Final   LDL Cholesterol 09/05/2022 57  0 - 99 mg/dL Final   Total CHOL/HDL Ratio 09/05/2022 4   Final   NonHDL 09/05/2022 88.83   Final   Sodium 09/05/2022 142  135 - 145 mEq/L Final   Potassium 09/05/2022 4.4  3.5 - 5.1 mEq/L Final   Chloride 09/05/2022 107  96 - 112 mEq/L Final   CO2 09/05/2022 28  19 - 32 mEq/L Final   Glucose, Bld 09/05/2022 101 (H)  70 - 99 mg/dL Final   BUN 94/70/7975 19  6 - 23 mg/dL Final   Creatinine, Ser 09/05/2022 1.17  0.40 - 1.50 mg/dL Final   Total Bilirubin 09/05/2022 0.4  0.2 - 1.2 mg/dL Final   Alkaline Phosphatase 09/05/2022 73  39 - 117 U/L Final   AST 09/05/2022 18  0 - 37 U/L Final   ALT 09/05/2022 15  0 - 53 U/L Final   Total Protein 09/05/2022 6.5   6.0 - 8.3 g/dL Final   Albumin 94/70/7975 4.0  3.5 - 5.2 g/dL Final   GFR 94/70/7975 59.00 (L)  >60.00 mL/min Final   Calcium  09/05/2022 8.6  8.4 - 10.5 mg/dL Final   VITD 94/70/7975 46.57  30.00 - 100.00 ng/mL Final   Magnesium 09/05/2022 1.9  1.5 - 2.5 mg/dL Final   Phosphorus 94/70/7975 2.1 (L)  2.3 - 4.6 mg/dL Final   Uric Acid, Serum 09/05/2022 5.7  4.0 - 7.8 mg/dL Final  Scanned Document on 06/20/2022  Component Date Value Ref Range Status   [facility] Colonoscopy 06/20/2022 See Report (in chart)  See Report (in chart), Patient Reported Final  Appointment on 06/04/2022  Component Date Value Ref Range Status   Weight 06/04/2022 2,848  oz Final   Height 06/04/2022 70  in Final   BP 06/04/2022 122/70  mmHg Final   S' Lateral 06/04/2022 2.15  cm Final   Area-P  1/2 06/04/2022 3.77  cm2 Final   MV M vel 06/04/2022 3.23  m/s Final   MV Peak grad 06/04/2022 41.6  mmHg Final   Est EF 06/04/2022 55 - 60%   Final  Hospital Outpatient Visit on 05/20/2022  Component Date Value Ref Range Status   Creatinine, Ser 05/20/2022 1.30 (H)  0.61 - 1.24 mg/dL Final  There may be more visits with results that are not included.  No image results found. DG Abd 2 Views Result Date: 10/12/2023 CLINICAL DATA:  constipation and abd discomfort. EXAM: ABDOMEN - 2 VIEW COMPARISON:  May 20, 2022 FINDINGS: Nonobstructive bowel gas pattern. Moderate volume fecal loading in the ascending colon. No pneumoperitoneum. No organomegaly or radiopaque calculi. Cholecystectomy clips. No acute fracture or destructive lesion. The lung bases are clear.Multilevel degenerative disc disease of the spine. Diffuse aortic atherosclerosis. IMPRESSION: Nonobstructive bowel gas pattern. Moderate volume fecal loading in the ascending colon, as can be seen in constipation. Electronically Signed   By: Rogelia Myers M.D.   On: 10/12/2023 12:18   DG Chest 2 View Result Date: 10/08/2023 CLINICAL DATA:  History of pneumonia, worsening fatigue  EXAM: CHEST - 2 VIEW COMPARISON:  Chest x-ray Aug 14, 2023 FINDINGS: The cardiomediastinal silhouette is unchanged in contour. Persistent left lower lobe opacity, decreased in conspicuity compared to Aug 14, 2023. No new focal pulmonary opacity. No pleural effusion or pneumothorax. The visualized upper abdomen is unremarkable. No acute osseous abnormality. IMPRESSION: Persistent but decreased left lower lobe opacity. Differential considerations include atelectasis versus resolving pneumonia. Electronically Signed   By: Dirk Arrant M.D.   On: 10/08/2023 13:25   DG Chest 2 View Result Date: 08/16/2023 CLINICAL DATA:  Chest congestion.  Short of breath. EXAM: CHEST - 2 VIEW COMPARISON:  01/14/2019.  CT, 05/02/2022. FINDINGS: Cardiac silhouette is normal in size. No mediastinal or hilar masses. No evidence of adenopathy. Lungs are hyperexpanded. There is opacity in the retrocardiac region of the left lower lobe. Minor linear scarring is noted lateral to this, which is stable. Remainder of the lungs is clear. Relative lucency in the upper lobes suggests emphysema. No pleural effusion or pneumothorax. Skeletal structures are intact. IMPRESSION: 1. Left lower lobe opacity consistent with pneumonia or atelectasis. 2. No other evidence of acute cardiopulmonary disease. 3. Emphysema. Electronically Signed   By: Alm Parkins M.D.   On: 08/16/2023 10:39         ASSESSMENT & PLAN   Assessment & Plan Polypharmacy He is on multiple medications, including controlled substances like pregabalin , which cannot be pill-packed. Simplifying his medication regimen is necessary, especially for his upcoming travel to [place] . Prescribe a 90-day supply with four refills for pantoprazole  and atorvastatin . Add a note to the pharmacy to include these medications in the pill packs if possible. Attempt to include pregabalin  in the pill pack, acknowledging it is a controlled substance. Encourage combining vitamin supplements into a  single pill if possible. Aortic atherosclerosis (HCC) Continue(s) with atorvastatin  refilled  Gastroesophageal reflux disease, unspecified whether esophagitis present He is on pantoprazole  for gastritis and hiatal hernia. Reduce the pantoprazole  dose to 20 mg and monitor tolerance. Autoimmune disease (HCC) Suspected as a possibility. History pneumonia not responsive to antibiotic(s) History neuropathy responds to prednisone   1:40 ANA titer Sudden tympanic membrane rupture Mild chronic kidney disease  Chronic fatigue Extensive arthritis Small fiber neuropathy Idiopathic peripheral neuropathy He experiences burning and pain in the feet and legs, alleviated by prednisone  which was prescribed for knee.  Multiple neurologist  suspect(s) due to a paint fume- with persistent small fiber neuropathy.  Surprising prednisone  benefits suggest autoimmune.. Topical treatments for neuropathy have shown improvement. Prescribed prednisone  for use during severe neuropathy flare-ups, especially during travel. Continue current topical treatments for neuropathy.  Of note he also had recent pneumonia that was difficult to clear suspicious for autoimmune. Chronic kidney disease, stage 2 (mild) Try to reduce proton pump inhibitor (PPI) stomach acid reducer Avoid NSAIDs Continue(s) with monitoring Primary osteoarthritis involving multiple joints Knee pain is managed with prednisone , which is effective but causes increased urination. He is on a tapering dose of prednisone  and uses Voltaren  cream for joint pain. Minimize celecoxib  use due to potential adverse effects and consider a step-down dose. Continue the current prednisone  tapering regimen and use Voltaren  cream as needed. Consider knee injections if oral medications become ineffective. OSA on CPAP He uses a CPAP machine regularly and reports increased water usage, possibly contributing to increased urination. He experiences dry mouth and uses mouthwash to  alleviate symptoms. Continue regular use of the CPAP machine. Monitor water usage and adjust as needed to manage symptoms of dry mouth and increased urination.  ORDER ASSOCIATIONS  #   DIAGNOSIS / CONDITION ICD-10 ENCOUNTER ORDER     ICD-10-CM   1. Polypharmacy  Z79.899     2. Aortic atherosclerosis (HCC)  I70.0 atorvastatin  (LIPITOR) 40 MG tablet    3. Gastroesophageal reflux disease, unspecified whether esophagitis present  K21.9 pantoprazole  (PROTONIX ) 20 MG tablet    4. Idiopathic peripheral neuropathy  G60.9 celecoxib  (CELEBREX ) 100 MG capsule    5. Chronic kidney disease, stage 2 (mild)  N18.2     6. Small fiber neuropathy  G62.9 predniSONE  (DELTASONE ) 20 MG tablet    7. Primary osteoarthritis involving multiple joints  M15.0     8. OSA on CPAP  G47.33      Meds ordered this encounter  Medications   atorvastatin  (LIPITOR) 40 MG tablet    Sig: Take 1 tablet (40 mg total) by mouth at bedtime. add to pill packs if possible    Dispense:  90 tablet    Refill:  3    Patient transferring all medications from [employer], would like pill packs.   pantoprazole  (PROTONIX ) 20 MG tablet    Sig: Take 1 tablet (20 mg total) by mouth daily. Replaces 40 mg dose, step down dose trial.    Dispense:  90 tablet    Refill:  4   celecoxib  (CELEBREX ) 100 MG capsule    Sig: Take 1 capsule (100 mg total) by mouth 2 (two) times daily. Replaces 200 mg dose, trial step down to minimize pills    Dispense:  180 capsule    Refill:  4   predniSONE  (DELTASONE ) 20 MG tablet    Sig: 2 tablets (40 mg total) daily for 3 days, THEN 1 tablet (20 mg total) daily for 4 days. Only take for severe flare neuropathy.  As needed burst of treatment for extreme diffiuse joint and nerve pain.    Dispense:  10 tablet    Refill:  0     This document was synthesized by artificial intelligence ([employer]) using HIPAA-compliant recording of the clinical interaction;   We discussed the use of AI scribe software for  clinical note transcription with the patient, who gave verbal consent to proceed. additional Info: This encounter employed state-of-the-art, real-time, collaborative documentation. The patient actively reviewed and assisted in updating their electronic medical record on a shared screen, ensuring transparency  and facilitating joint problem-solving for the problem list, overview, and plan. This approach promotes accurate, informed care. The treatment plan was discussed and reviewed in detail, including medication safety, potential side effects, and all patient questions. We confirmed understanding and comfort with the plan. Follow-up instructions were established, including contacting the office for any concerns, returning if symptoms worsen, persist, or new symptoms develop, and precautions for potential emergency department visits.

## 2023-11-11 NOTE — Assessment & Plan Note (Signed)
 He is on multiple medications, including controlled substances like pregabalin , which cannot be pill-packed. Simplifying his medication regimen is necessary, especially for his upcoming travel to [place] . Prescribe a 90-day supply with four refills for pantoprazole  and atorvastatin . Add a note to the pharmacy to include these medications in the pill packs if possible. Attempt to include pregabalin  in the pill pack, acknowledging it is a controlled substance. Encourage combining vitamin supplements into a single pill if possible.

## 2023-11-11 NOTE — Assessment & Plan Note (Signed)
 He uses a CPAP machine regularly and reports increased water usage, possibly contributing to increased urination. He experiences dry mouth and uses mouthwash to alleviate symptoms. Continue regular use of the CPAP machine. Monitor water usage and adjust as needed to manage symptoms of dry mouth and increased urination.

## 2023-11-11 NOTE — Assessment & Plan Note (Signed)
 He experiences burning and pain in the feet and legs, alleviated by prednisone  which was prescribed for knee.  Multiple neurologist suspect(s) due to a paint fume- with persistent small fiber neuropathy.  Surprising prednisone  benefits suggest autoimmune.. Topical treatments for neuropathy have shown improvement. Prescribed prednisone  for use during severe neuropathy flare-ups, especially during travel. Continue current topical treatments for neuropathy.  Of note he also had recent pneumonia that was difficult to clear suspicious for autoimmune.

## 2023-11-12 ENCOUNTER — Ambulatory Visit: Admitting: Internal Medicine

## 2023-11-15 ENCOUNTER — Other Ambulatory Visit: Payer: Self-pay

## 2023-11-18 ENCOUNTER — Other Ambulatory Visit (HOSPITAL_BASED_OUTPATIENT_CLINIC_OR_DEPARTMENT_OTHER): Payer: Self-pay

## 2023-11-19 ENCOUNTER — Ambulatory Visit: Admitting: Internal Medicine

## 2023-11-20 ENCOUNTER — Ambulatory Visit: Admitting: Internal Medicine

## 2023-11-27 ENCOUNTER — Other Ambulatory Visit (HOSPITAL_BASED_OUTPATIENT_CLINIC_OR_DEPARTMENT_OTHER): Payer: Self-pay

## 2023-11-27 ENCOUNTER — Other Ambulatory Visit: Payer: Self-pay

## 2023-12-04 ENCOUNTER — Other Ambulatory Visit (HOSPITAL_BASED_OUTPATIENT_CLINIC_OR_DEPARTMENT_OTHER): Payer: Self-pay

## 2023-12-04 DIAGNOSIS — Z961 Presence of intraocular lens: Secondary | ICD-10-CM | POA: Diagnosis not present

## 2023-12-04 DIAGNOSIS — H524 Presbyopia: Secondary | ICD-10-CM | POA: Diagnosis not present

## 2023-12-05 ENCOUNTER — Other Ambulatory Visit: Payer: Self-pay

## 2023-12-05 ENCOUNTER — Other Ambulatory Visit (HOSPITAL_BASED_OUTPATIENT_CLINIC_OR_DEPARTMENT_OTHER): Payer: Self-pay

## 2023-12-06 ENCOUNTER — Other Ambulatory Visit (HOSPITAL_BASED_OUTPATIENT_CLINIC_OR_DEPARTMENT_OTHER): Payer: Self-pay

## 2023-12-06 ENCOUNTER — Other Ambulatory Visit: Payer: Self-pay

## 2023-12-10 ENCOUNTER — Ambulatory Visit (INDEPENDENT_AMBULATORY_CARE_PROVIDER_SITE_OTHER): Admitting: Otolaryngology

## 2023-12-10 ENCOUNTER — Encounter (INDEPENDENT_AMBULATORY_CARE_PROVIDER_SITE_OTHER): Payer: Self-pay | Admitting: Otolaryngology

## 2023-12-10 VITALS — BP 127/70 | HR 66

## 2023-12-10 DIAGNOSIS — H6123 Impacted cerumen, bilateral: Secondary | ICD-10-CM

## 2023-12-10 DIAGNOSIS — H60331 Swimmer's ear, right ear: Secondary | ICD-10-CM

## 2023-12-11 DIAGNOSIS — H6123 Impacted cerumen, bilateral: Secondary | ICD-10-CM | POA: Insufficient documentation

## 2023-12-11 NOTE — Progress Notes (Signed)
 Patient ID: Stephen Anderson, male   DOB: February 20, 1941, 83 y.o.   MRN: [id number]  Follow-up: Right fungal otitis externa  HPI: The patient is an 83 year old male who returns today for his follow-up evaluation.  The patient was previously seen for his right ear pain and right ear hearing loss.  He was noted to have a right fungal otitis externa.  He was treated with debridement and CSF powder.  The patient returns today reporting no recurrent otitis externa since his last visit.  He also denies any change in his hearing.  Currently he denies any otalgia, otorrhea, or vertigo.  Exam: General: Communicates without difficulty, well nourished, no acute distress. Head: Normocephalic, no evidence injury, no tenderness, facial buttresses intact without stepoff. Face/sinus: No tenderness to palpation and percussion. Facial movement is normal and symmetric. Eyes: PERRL, EOMI. No scleral icterus, conjunctivae clear. Neuro: CN II exam reveals vision grossly intact.  No nystagmus at any point of gaze. Ears: Auricles well formed without lesions.  Bilateral cerumen impaction.  Nose: External evaluation reveals normal support and skin without lesions.  Dorsum is intact.  Anterior rhinoscopy reveals congested mucosa over anterior aspect of inferior turbinates and intact septum.  No purulence noted. Oral:  Oral cavity and oropharynx are intact, symmetric, without erythema or edema.  Mucosa is moist without lesions. Neck: Full range of motion without pain.  There is no significant lymphadenopathy.  No masses palpable.  Thyroid  bed within normal limits to palpation.  Parotid glands and submandibular glands equal bilaterally without mass.  Trachea is midline. Neuro:  CN 2-12 grossly intact.   Procedure: Bilateral cerumen disimpaction Anesthesia: None Description: Under the operating microscope, the cerumen is carefully removed with a combination of cerumen currette, alligator forceps, and suction catheters.  After the cerumen is  removed, the TMs are noted to be normal.  No mass, erythema, or lesions. The patient tolerated the procedure well.    Assessment: 1.  Bilateral cerumen impaction.  After the cerumen removal procedure, both ear canals, tympanic membranes, and middle ear spaces are noted to be normal. 2.  No recurrent fungal otitis externa is noted today.  Plan: 1.  Otomicroscopy with bilateral cerumen disimpaction. 2.  The physical exam findings are reviewed with the patient. 3.  CSF powder as needed to treat any recurrent otitis externa. 4.  The patient is encouraged to call with any questions or concerns.

## 2023-12-18 ENCOUNTER — Other Ambulatory Visit: Payer: Self-pay

## 2023-12-18 ENCOUNTER — Other Ambulatory Visit (HOSPITAL_BASED_OUTPATIENT_CLINIC_OR_DEPARTMENT_OTHER): Payer: Self-pay

## 2023-12-19 ENCOUNTER — Other Ambulatory Visit (HOSPITAL_BASED_OUTPATIENT_CLINIC_OR_DEPARTMENT_OTHER): Payer: Self-pay

## 2023-12-19 ENCOUNTER — Ambulatory Visit (INDEPENDENT_AMBULATORY_CARE_PROVIDER_SITE_OTHER): Admitting: Internal Medicine

## 2023-12-19 ENCOUNTER — Encounter: Payer: Self-pay | Admitting: Internal Medicine

## 2023-12-19 ENCOUNTER — Other Ambulatory Visit: Payer: Self-pay

## 2023-12-19 VITALS — BP 110/78 | HR 78 | Temp 98.0°F | Ht 70.5 in | Wt 174.4 lb

## 2023-12-19 DIAGNOSIS — G2581 Restless legs syndrome: Secondary | ICD-10-CM | POA: Diagnosis not present

## 2023-12-19 DIAGNOSIS — L210 Seborrhea capitis: Secondary | ICD-10-CM

## 2023-12-19 DIAGNOSIS — Z79899 Other long term (current) drug therapy: Secondary | ICD-10-CM | POA: Diagnosis not present

## 2023-12-19 DIAGNOSIS — R400 Somnolence: Secondary | ICD-10-CM | POA: Diagnosis not present

## 2023-12-19 DIAGNOSIS — G4733 Obstructive sleep apnea (adult) (pediatric): Secondary | ICD-10-CM

## 2023-12-19 DIAGNOSIS — G609 Hereditary and idiopathic neuropathy, unspecified: Secondary | ICD-10-CM

## 2023-12-19 DIAGNOSIS — K30 Functional dyspepsia: Secondary | ICD-10-CM | POA: Diagnosis not present

## 2023-12-19 DIAGNOSIS — M170 Bilateral primary osteoarthritis of knee: Secondary | ICD-10-CM

## 2023-12-19 DIAGNOSIS — G479 Sleep disorder, unspecified: Secondary | ICD-10-CM

## 2023-12-19 MED ORDER — SELENIUM SULFIDE 2.5 % EX LOTN
1.0000 | TOPICAL_LOTION | Freq: Every day | CUTANEOUS | 12 refills | Status: DC | PRN
  Filled 2023-12-19: qty 120, 30d supply, fill #0

## 2023-12-19 MED ORDER — PANTOPRAZOLE SODIUM 40 MG PO TBEC
40.0000 mg | DELAYED_RELEASE_TABLET | Freq: Every day | ORAL | 0 refills | Status: DC
  Filled 2023-12-19: qty 30, 30d supply, fill #0

## 2023-12-19 MED ORDER — ROPINIROLE HCL 1 MG PO TABS
1.0000 mg | ORAL_TABLET | Freq: Every day | ORAL | 4 refills | Status: DC
  Filled 2023-12-19: qty 90, 90d supply, fill #0
  Filled 2023-12-19: qty 30, 30d supply, fill #0
  Filled 2024-01-08 – 2024-01-13 (×2): qty 30, 30d supply, fill #1
  Filled 2024-01-17: qty 90, 90d supply, fill #2
  Filled 2024-02-12: qty 30, 30d supply, fill #2

## 2023-12-19 NOTE — Assessment & Plan Note (Signed)
 This condition contributes to poor sleep quality. Discussed treatment options, including ropinirole  or pramipexole, with concerns about drowsiness as a side effect. Prescribe ropinirole  or pramipexole at a low dose. Consider physical therapy for stretching and massage. Evaluate iron supplementation.

## 2023-12-19 NOTE — Assessment & Plan Note (Signed)
 Uses a CPAP machine nightly but experiences poor sleep quality and daytime drowsiness. CPAP may not be fully effective due to other sleep disturbances.

## 2023-12-19 NOTE — Assessment & Plan Note (Signed)
 Chronic bilateral knee pain from osteoarthritis worsens with prolonged standing and activities. Despite using knee pads, soreness persists. Schedule a knee injection for pain management.

## 2023-12-19 NOTE — Assessment & Plan Note (Signed)
 Experiences intermittent indigestion despite pantoprazole  20 mg. Increase pantoprazole  to 40 mg daily for 30 days to manage indigestion.

## 2023-12-19 NOTE — Patient Instructions (Addendum)
 It was a pleasure seeing you today! Your health and satisfaction are our top priorities.  Stephen Cone, MD  VISIT SUMMARY: Today, we discussed your chronic knee pain, sleep disturbances, and other health concerns. We reviewed your current medications and made some adjustments to help manage your symptoms more effectively.  YOUR PLAN: -PRIMARY OSTEOARTHRITIS OF BOTH KNEES: Osteoarthritis is a condition where the protective cartilage in your knees wears down over time, causing pain and stiffness. We will schedule a knee injection to help manage your pain.  -IDIOPATHIC PERIPHERAL NEUROPATHY: Peripheral neuropathy is a condition that causes burning and tingling sensations in your legs. We discussed that the 5% lidocaine  cream was effective for you, so we will continue with that.  -RESTLESS LEGS SYNDROME: Restless legs syndrome causes an uncontrollable urge to move your legs, often leading to poor sleep. We discussed starting you on a low dose of either ropinirole  or pramipexole and considering physical therapy for stretching and massage. We will also evaluate if iron supplementation is needed.  -SOMNOLENCE (DAYTIME DROWSINESS): Daytime drowsiness is likely due to poor sleep quality. We recommend discontinuing caffeine tablets to see if this improves your restfulness. We will continue to monitor your sleep quality and adjust treatment as needed.  -OBSTRUCTIVE SLEEP APNEA: Obstructive sleep apnea is a condition where your breathing stops and starts during sleep. You are using a CPAP machine, but we need to address other sleep disturbances to improve your overall sleep quality.  -GASTROESOPHAGEAL REFLUX DISEASE WITHOUT ESOPHAGITIS: This condition causes indigestion and heartburn. We will increase your pantoprazole  dose to 40 mg daily for 30 days to better manage your symptoms.  -SEBORRHEIC DERMATITIS OF SCALP: Seborrheic dermatitis is a skin condition that causes dandruff and discomfort. We will  prescribe selenium  sulfide lotion to use with your ketoconazole  shampoo. If there is no improvement, we will refer you to a dermatologist.  -MEDICATION MANAGEMENT: We reviewed your medications and understand your frustration with the pill pack system. We will provide a list of your medications for review and adjustment. Regular follow-up appointments are encouraged to monitor and adjust your treatment plans.  INSTRUCTIONS: We will schedule a knee injection for your osteoarthritis pain. Please discontinue caffeine tablets and monitor your sleep quality. Start using selenium  sulfide lotion with your ketoconazole  shampoo for your scalp. Increase your pantoprazole  to 40 mg daily for 30 days. Follow up with us  to review your medication list and adjust as needed. If your scalp condition does not improve, we will refer you to a dermatologist.  Your Providers PCP: Stephen Stephen MATSU, MD,  [phone]) Referring Provider: [name], MD,  [phone]) Care Team Provider: Evonnie Anderson RAMAN, DO,  [phone]) Care Team Provider: Neysa Reggy BIRCH, MD,  [phone]) Care Team Provider: Joshua Anderson Care Team Provider: Gerome Charleston, MD,  [phone]) Care Team Provider: Donnald Charleston, MD,  [phone]) Care Team Provider: Leslee Reusing, MD,  [phone]) Care Team Provider: Leslee Reusing, MD,  [phone]) Care Team Provider: Livingston Rigg, MD,  [phone]) Care Team Provider: Porter Andrez SAUNDERS, PA-C Care Team Provider: Lonni Slain, MD,  [phone]) Care Team Provider: Dianna Specking, MD,  [phone]) Care Team Provider: Jeannetta Stephen ORN, MD,  [phone])  NEXT STEPS: [x]  Early Intervention: Schedule sooner appointment, call our on-call services, or go to emergency room if there is any significant Increase in pain or discomfort New or worsening symptoms Sudden or severe changes in your health [x]  Flexible Follow-Up: We recommend a Return in  about 2 weeks (around 01/02/2024) for knee injection,  sleep review, indigestion, dandruff. other stuff. for optimal routine care. This allows for progress monitoring and treatment adjustments. [x]  Preventive Care: Schedule your annual preventive care visit! It's typically covered by insurance and helps identify potential health issues early. [x]  Lab & X-ray Appointments: Incomplete tests scheduled today, or call to schedule. X-rays: [facility] (M-F, 8:30am-noon or 1pm-5pm). [x]  Medical Information Release: Sign a release form at front desk to obtain relevant medical information we don't have.  MAKING THE MOST OF OUR FOCUSED 20 MINUTE APPOINTMENTS: [x]   Clearly state your top concerns at the beginning of the visit to focus our discussion [x]   If you anticipate you will need more time, please inform the front desk during scheduling - we can book multiple appointments in the same week. [x]   If you have transportation problems- use our convenient video appointments or ask about transportation support. [x]   We can get down to business faster if you use MyChart to update information before the visit and submit non-urgent questions before your visit. Thank you for taking the time to provide details through MyChart.  Let our nurse know and she can import this information into your encounter documents.  Arrival and Wait Times: [x]   Arriving on time ensures that everyone receives prompt attention. [x]   Early morning (8a) and afternoon (1p) appointments tend to have shortest wait times. [x]   Unfortunately, we cannot delay appointments for late arrivals or hold slots during phone calls.  Getting Answers and Following Up [x]   Simple Questions & Concerns: For quick questions or basic follow-up after your visit, reach us  at [phone] or MyChart messaging. [x]   Complex Concerns: If your concern is more complex, scheduling an appointment might be best. Discuss this with the staff to find the  most suitable option. [x]   Lab & Imaging Results: We'll contact you directly if results are abnormal or you don't use MyChart. Most normal results will be on MyChart within 2-3 business days, with a review message from Dr. Jesus. Haven't heard back in 2 weeks? Need results sooner? Contact us  at [phone]. [x]   Referrals: Our referral coordinator will manage specialist referrals. The specialist's office should contact you within 2 weeks to schedule an appointment. Call us  if you haven't heard from them after 2 weeks.  Staying Connected [x]   MyChart: Activate your MyChart for the fastest way to access results and message us . See the last page of this paperwork for instructions on how to activate.  Bring to Your Next Appointment [x]   Medications: Please bring all your medication bottles to your next appointment to ensure we have an accurate record of your prescriptions. [x]   Health Diaries: If you're monitoring any health conditions at home, keeping a diary of your readings can be very helpful for discussions at your next appointment.  Billing [x]   X-ray & Lab Orders: These are billed by separate companies. Contact the invoicing company directly for questions or concerns. [x]   Visit Charges: Discuss any billing inquiries with our administrative services team.  Your Satisfaction Matters [x]   Share Your Experience: We strive for your satisfaction! If you have any complaints, or preferably compliments, please let Dr. Jesus know directly or contact our Practice Administrators, Manuelita Rubin or [employer], by asking at the front desk.   Reviewing Your Records [x]   Review this early draft of your clinical encounter notes below and the final encounter summary tomorrow on MyChart after its been completed.  All orders placed so far are visible here:  Knee arthritis, bilateral  Dandruff -     Selenium  Sulfide; Apply 1 Application topically daily as needed for irritation.  Dispense: 118 mL;  Refill: 12 -     Ambulatory referral to Dermatology  Drowsiness  RLS (restless legs syndrome) -     rOPINIRole  HCl; Take 1 tablet (1 mg total) by mouth at bedtime. Start at 0.5 mg nightly, then after 1 week, up to 1 mg nightly, for restless legs.  Dispense: 90 tablet; Refill: 4  Upset stomach -     Pantoprazole  Sodium; Take 1 tablet (40 mg total) by mouth daily.  Dispense: 30 tablet; Refill: 0  Sleep disturbances  Idiopathic peripheral neuropathy  OSA on CPAP  Medication management        Current Medication List Review Medications Possibly Contributing to Poor Sleep/Drowsiness Caffeine 200 mg AM: May worsen insomnia, restlessness, and disrupt sleep architecture. Even morning use can affect sleep in sensitive patients. Pregabalin  200 mg BID: Can cause daytime drowsiness, especially if taken in the morning. Primidone  50 mg BID: CNS depressant; may cause daytime drowsiness. Nifedipine : May cause fatigue. Celecoxib , Aspirin, Atorvastatin , Pantoprazole , Cilostazol , vitamins, topical meds: Unlikely to affect sleep directly.  Targeted Recommendations 1. Caffeine Reduce or discontinue. If patient must use, limit to <=100 mg and take before 9 AM. 2. Pregabalin  Shift dosing to evening/bedtime if possible. Example: 400 mg total, taken as 200 mg at dinner and 200 mg at bedtime. This may reduce daytime drowsiness and support sleep. 3. Primidone  Consider shifting both doses to evening/bedtime. If tremor control allows, take both doses after 6 PM. 4. Sleep-promoting Medications Consider adding a non-habit-forming sleep aid: Melatonin 3-5 mg at bedtime: Safe, may help with sleep onset. Trazodone 25-50 mg at bedtime: Useful for sleep maintenance, low risk of dependence. Mirtazapine 7.5-15 mg at bedtime: May help with sleep and mood if indicated. Avoid benzodiazepines or Z-drugs unless absolutely necessary. 5. Ropinirole  Prescribe as previously discussed for restlessness/RLS  symptoms. Start at 0.25 mg at bedtime, titrate as needed. 6. Non-Pharmacologic Sleep hygiene: No screens before bed, regular sleep schedule, cool/dark room. CBT-I referral: Cognitive behavioral therapy for insomnia is effective.  Sample Optimized Medication Schedule Time Medication(s) Rationale  Morning Caffeine (if kept, <=100 mg), vitamins, others Minimize caffeine, avoid sedatives  Noon Non-sedating meds   Dinner Pregabalin  200 mg Move sedating meds later  Bedtime Pregabalin  200 mg, Primidone  50 mg, Ropinirole  0.25 mg, Melatonin/Trazodone/Mirtazapine (if added) Support sleep onset, treat restlessness   Summary of Recommendations Reduce or discontinue caffeine. Shift pregabalin  and primidone  dosing to evening/bedtime. Prescribe ropinirole  at bedtime for restlessness/RLS. Consider adding melatonin, trazodone, or mirtazapine for sleep if non-pharmacologic measures fail. Continue CPAP and reinforce sleep hygiene.  Clinical Documentation Example: Patient compliant with CPAP, but reports persistent drowsiness and poor sleep quality with difficulty falling/staying asleep and restlessness. Recommend reducing caffeine intake, shifting pregabalin  and primidone  dosing to evening/bedtime, and initiating ropinirole  for RLS symptoms. Consider melatonin or trazodone at bedtime if sleep remains poor. Reinforce sleep hygiene and consider CBT-I referral.

## 2023-12-19 NOTE — Progress Notes (Signed)
 ==============================  [facility]: [phone]   -- Medical Office Visit --  Patient: Stephen Anderson      Age: 83 y.o.       Sex:  male  Date:   12/19/2023 Today's Healthcare Provider: Bernardino KANDICE Cone, MD  ==============================   Chief Complaint: Medication Refill, GI Problem, Knee Pain (Would like to make appt for knee injection next appt.), Eczema (Pt states has a lot of dry skin going on would like dermo appt set up), and Fatigue (A lot of no energy anymore)  Discussed the use of AI scribe software for clinical note transcription with the patient, who gave verbal consent to proceed.  History of Present Illness 83 year old male who presents with knee pain and sleep disturbances.  He experiences chronic knee pain, describing it as soreness that worsens with standing and activity, especially on concrete surfaces despite using knee pads and rugs for cushioning. He has difficulty bending and experiences equal pain in both knees. He has previously received knee injections years ago.  He experiences neuropathy in his legs, contributing to sleep disturbances. He has been unable to rest since Sunday, describing his sleep as restless and frequently changing positions due to discomfort. Despite using a CPAP machine nightly, he feels drowsy during the day. He has tried topical creams for leg burning without success and has not been taking pregabalin  for the past week due to pharmacy supply issues.  He reports dandruff and dry scalp issues, which he has attempted to manage with ketoconazole  shampoo without success. He describes significant dandruff accumulation, particularly behind his ears, despite following the recommended application method. He has been using antifungal shampoos.  He takes a variety of medications, including pregabalin , Tylenol, vitamin C, B12, aspirin, and a caffeine pill. He is frustrated with the current pill pack  system. He also takes pantoprazole  for indigestion, which he believes is under control with the current regimen.  He experiences daytime drowsiness and fatigue, which he attributes to poor sleep quality. He uses a CPAP machine and nasal spray nightly. He takes a caffeine pill in the morning to combat drowsiness but acknowledges not getting sufficient sleep, averaging four to five hours per night. He describes his sleep as interrupted and restless, often falling asleep in a chair before going to bed. Background Reviewed: Problem List: has Essential tremor; Idiopathic peripheral neuropathy; OSA on CPAP; GERD (gastroesophageal reflux disease); Mixed hyperlipidemia; Osteoarthritis, multiple sites; History of melanoma; Insomnia; B12 deficiency; RLS (restless legs syndrome); Chronic fatigue; Knee arthritis, bilateral; Pain in joint of right shoulder; Chest discomfort; Former smoker; High risk medication use; Kidney cyst, acquired; Fatty liver; Thromboangiitis obliterans (Buerger's disease) (HCC); RUQ abdominal pain; 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, not believe to be due to lupus or systemic sclerosis; Diverticular disease; Right inguinal hernia; Aortic atherosclerosis (HCC); Bronchiectasis (HCC); Recurrent kidney stones; Calcification of prostate; DDD (degenerative disc disease), lumbar; LVH (left ventricular hypertrophy); Grade I diastolic dysfunction; Weight loss; Gastritis; Hiatal hernia; Lumbar back pain; Arthritis of both hands; Trigger finger, right ring finger; Chronic kidney disease, stage 2 (mild); Prediabetes; 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 Past Surgical History:   has a past surgical history that includes Gallbladder surgery; Hernia repair; Cataract extraction, bilateral; and Melanoma excision. Social History:   reports that he quit smoking about 15 years ago. His smoking use included cigarettes. He started smoking about 65 years ago. He has a 50 pack-year smoking history. He has never been exposed to tobacco smoke. He quit smokeless tobacco use about 21 years ago. He reports that he does not drink alcohol and does not use drugs. Family History:  family history includes Bladder Cancer in his brother; Heart disease in his brother; Lung cancer in his father; Neuropathy in his daughter. Allergies:  is allergic to hydrocodone, oxycodone, oxycontin [oxycodone hcl], and simvastatin.   Medication Reconciliation: Current Outpatient Medications on File Prior to Visit  Medication Sig   Acetaminophen (TYLENOL ARTHRITIS EXT RELIEF PO) Take 1 tablet by mouth every other day.   Alpha-Lipoic Acid 300 MG TABS Take 2 tablets (600 mg total) by mouth daily at 6 (six) AM.   Ascorbic Acid (VITAMIN C PO) Take 500 mg by mouth daily.   aspirin 81 MG tablet Take 81 mg by mouth every other day.    atorvastatin  (LIPITOR) 40 MG tablet Take 1 tablet (40 mg total) by mouth at bedtime. add  to pill packs if possible   caffeine 200 MG TABS tablet Take 200 mg by mouth in the morning. STAY AWAKE   celecoxib  (CELEBREX ) 100 MG capsule Take 1 capsule (100 mg total) by mouth 2 (two) times daily. Replaces 200 mg dose, trial step down to minimize pills   Cholecalciferol  (VITAMIN D -3) 125 MCG (5000 UT) TABS Take 1 tablet by mouth daily.   cilostazol  (PLETAL ) 50 MG tablet Take 1 tablet (50 mg total) by mouth 2 (two) times daily.   Cyanocobalamin  (B-12 PO) Take 1 tablet by mouth daily.   diclofenac  Sodium (VOLTAREN ) 1 % GEL Apply 2 g topically in the morning and at bedtime.   fluticasone  (FLONASE ) 50 MCG/ACT nasal spray Place 1 spray into both nostrils daily as needed.   ketoconazole  (NIZORAL ) 2 % shampoo Apply to the affected area(s) and let sit 3-5 minutes before rinsing.   lidocaine  (XYLOCAINE ) 5 % ointment Apply to the affected area(s) topically as needed.   NIFEdipine  (PROCARDIA -XL/NIFEDICAL-XL) 30 MG 24 hr tablet Take 1 tablet (30 mg total) by mouth daily.   NON FORMULARY CPAP   omega-3 acid ethyl esters (LOVAZA ) 1 g capsule Take 2 capsules (2 g total) by mouth 2 (two) times daily.   pantoprazole  (PROTONIX ) 20 MG tablet Take 1 tablet (20 mg total) by mouth daily. Replaces 40 mg dose, step down dose trial.   pregabalin  (LYRICA ) 200 MG capsule Take 1 capsule (200 mg total) by mouth 2 (two) times daily.   primidone  (MYSOLINE ) 50 MG tablet Take 1 tablet (50 mg total) by mouth 2 (two) times daily in morning and evening.   triamcinolone  cream (KENALOG ) 0.1 % Apply to the affected area(s) topically 2 (two) times daily. Use for up to 14 days.   No current facility-administered medications on file prior to visit.  There are no discontinued medications.  Physical Exam:    12/19/2023    7:51 AM 12/10/2023    4:26 PM 11/11/2023    8:51 AM  Vitals with BMI  Height 5' 10.5  5' 10.5  Weight 174 lbs 6 oz  173 lbs 10 oz  BMI 24.66  24.55  Systolic 110 127 871  Diastolic 78 70 72  Pulse 78 66  62  Vital signs reviewed.  Nursing notes reviewed. Weight trend reviewed. Physical Activity: Not on file   General Appearance:  No acute distress appreciable.   Well-groomed, healthy-appearing male.  Well proportioned with no abnormal fat distribution.  Good muscle tone. Pulmonary:  Normal work of breathing at rest, no respiratory distress apparent.    Musculoskeletal: All extremities are intact.  Neurological:  Awake, alert, oriented, and engaged.  No obvious focal neurological deficits or cognitive impairments.  Sensorium seems unclouded.   Speech is clear and coherent with logical content. Psychiatric:  Appropriate mood, pleasant and cooperative demeanor, thoughtful and engaged during the exam      05/17/2023    8:48 AM 05/01/2023    8:35 AM 03/27/2023    8:50 AM 01/16/2023    8:45 AM  PHQ 2/9 Scores  PHQ - 2 Score 0 0 0 1  PHQ- 9 Score  2 1 4    docrine  Serology  Results Review (optional):1} No results found for any visits on 12/19/23.} Office Visit on 10/07/2023  Component Date Value Ref Range Status   WBC 10/07/2023 8.0  4.0 - 10.5 K/uL Final   RBC 10/07/2023 4.90  4.22 - 5.81 Mil/uL Final   Hemoglobin 10/07/2023 14.8  13.0 - 17.0 g/dL Final   HCT 93/69/7974 44.0  39.0 - 52.0 % Final   MCV 10/07/2023 89.9  78.0 - 100.0 fl Final   MCHC 10/07/2023 33.6  30.0 - 36.0 g/dL Final   RDW 93/69/7974 14.1  11.5 - 15.5 % Final   Platelets 10/07/2023 227.0  150.0 - 400.0 K/uL Final   Neutrophils Relative % 10/07/2023 58.8  43.0 - 77.0 % Final   Lymphocytes Relative 10/07/2023 27.5  12.0 - 46.0 % Final   Monocytes Relative 10/07/2023 10.4  3.0 - 12.0 % Final   Eosinophils Relative 10/07/2023 2.2  0.0 - 5.0 % Final   Basophils Relative 10/07/2023 1.1  0.0 - 3.0 % Final   Neutro Abs 10/07/2023 4.7  1.4 - 7.7 K/uL Final   Lymphs Abs 10/07/2023 2.2  0.7 - 4.0 K/uL Final   Monocytes Absolute 10/07/2023 0.8  0.1 - 1.0 K/uL Final   Eosinophils Absolute 10/07/2023 0.2  0.0 - 0.7 K/uL Final    Basophils Absolute 10/07/2023 0.1  0.0 - 0.1 K/uL Final   Sodium 10/07/2023 142  135 - 145 mEq/L Final   Potassium 10/07/2023 4.1  3.5 - 5.1 mEq/L Final   Chloride 10/07/2023 104  96 - 112 mEq/L Final   CO2 10/07/2023 26  19 - 32 mEq/L Final   Glucose, Bld 10/07/2023 94  70 - 99 mg/dL Final   BUN 93/69/7974 31 (H)  6 - 23 mg/dL Final   Creatinine, Ser 10/07/2023 1.49  0.40 - 1.50 mg/dL Final   Total Bilirubin 10/07/2023 0.5  0.2 - 1.2 mg/dL Final   Alkaline Phosphatase 10/07/2023 82  39 - 117 U/L Final   AST 10/07/2023 26  0 - 37 U/L Final   ALT 10/07/2023 40  0 - 53 U/L Final   Total Protein 10/07/2023 7.1  6.0 - 8.3 g/dL Final  Albumin 10/07/2023 4.6  3.5 - 5.2 g/dL Final   GFR 93/69/7974 43.80 (L)  >60.00 mL/min Final   Calcium  10/07/2023 9.4  8.4 - 10.5 mg/dL Final   Sed Rate 93/69/7974 7  0 - 20 mm/hr Final   Pro B Natriuretic peptide (BNP) 10/07/2023 12.0  0.0 - 100.0 pg/mL Final   Vitamin B-12 10/07/2023 1,138 (H)  211 - 911 pg/mL Final   Folate 10/07/2023 >23.2  >5.9 ng/mL Final   Rheumatoid fact SerPl-aCnc 10/07/2023 <10  <14 IU/mL Final   Anti Nuclear Antibody (ANA) 10/07/2023 POSITIVE (A)  NEGATIVE Final   ANA Titer 1 10/07/2023 1:40 (H)  titer Final   ANA Pattern 1 10/07/2023 Nuclear, Homogeneous (A)   Final  Office Visit on 08/21/2023  Component Date Value Ref Range Status   MICRO NUMBER: 08/21/2023 [id number]   Final   SPECIMEN QUALITY: 08/21/2023 Adequate   Final   Source: 08/21/2023 NOT GIVEN   Final   STATUS: 08/21/2023 FINAL   Final   GRAM STAIN: 08/21/2023 No epithelial cells seen No white blood cells seen No organisms seen   Final   ANA RESULT: 08/21/2023 No anaerobes isolated.   Final   MICRO NUMBER: 08/21/2023 [id number]   Final   SPECIMEN QUALITY: 08/21/2023 Adequate   Final   SOURCE: 08/21/2023 NOT GIVEN   Final   STATUS: 08/21/2023 FINAL   Final   AER RESULT: 08/21/2023 No Growth   Final   COMMENT: 08/21/2023 No source was provided. The specimen was tested  and reported based upon the test code ordered. If this is incorrect, please contact client services.   Final  Office Visit on 08/14/2023  Component Date Value Ref Range Status   WBC 08/14/2023 11.4 (H)  4.0 - 10.5 K/uL Final   RBC 08/14/2023 4.23  4.22 - 5.81 Mil/uL Final   Hemoglobin 08/14/2023 13.0  13.0 - 17.0 g/dL Final   HCT 94/92/7974 38.3 (L)  39.0 - 52.0 % Final   MCV 08/14/2023 90.5  78.0 - 100.0 fl Final   MCHC 08/14/2023 33.9  30.0 - 36.0 g/dL Final   RDW 94/92/7974 13.9  11.5 - 15.5 % Final   Platelets 08/14/2023 228.0  150.0 - 400.0 K/uL Final   Neutrophils Relative % 08/14/2023 79.4 (H)  43.0 - 77.0 % Final   Lymphocytes Relative 08/14/2023 13.4  12.0 - 46.0 % Final   Monocytes Relative 08/14/2023 6.2  3.0 - 12.0 % Final   Eosinophils Relative 08/14/2023 0.2  0.0 - 5.0 % Final   Basophils Relative 08/14/2023 0.8  0.0 - 3.0 % Final   Neutro Abs 08/14/2023 9.0 (H)  1.4 - 7.7 K/uL Final   Lymphs Abs 08/14/2023 1.5  0.7 - 4.0 K/uL Final   Monocytes Absolute 08/14/2023 0.7  0.1 - 1.0 K/uL Final   Eosinophils Absolute 08/14/2023 0.0  0.0 - 0.7 K/uL Final   Basophils Absolute 08/14/2023 0.1  0.0 - 0.1 K/uL Final   Sodium 08/14/2023 137  135 - 145 mEq/L Final   Potassium 08/14/2023 4.8  3.5 - 5.1 mEq/L Final   Chloride 08/14/2023 105  96 - 112 mEq/L Final   CO2 08/14/2023 24  19 - 32 mEq/L Final   Glucose, Bld 08/14/2023 119 (H)  70 - 99 mg/dL Final   BUN 94/92/7974 27 (H)  6 - 23 mg/dL Final   Creatinine, Ser 08/14/2023 1.11  0.40 - 1.50 mg/dL Final   Total Bilirubin 08/14/2023 0.2  0.2 - 1.2 mg/dL Final  Alkaline Phosphatase 08/14/2023 74  39 - 117 U/L Final   AST 08/14/2023 28  0 - 37 U/L Final   ALT 08/14/2023 38  0 - 53 U/L Final   Total Protein 08/14/2023 7.0  6.0 - 8.3 g/dL Final   Albumin 94/92/7974 3.7  3.5 - 5.2 g/dL Final   GFR 94/92/7974 62.43  >60.00 mL/min Final   Calcium  08/14/2023 8.7  8.4 - 10.5 mg/dL Final   Pro B Natriuretic peptide (BNP) 08/14/2023 24.0   0.0 - 100.0 pg/mL Final   CRP 08/14/2023 4.0  0.5 - 20.0 mg/dL Final   Sed Rate 94/92/7974 44 (H)  0 - 20 mm/hr Final   Vitamin B-12 08/14/2023 634  211 - 911 pg/mL Final   Folate 08/14/2023 13.1  >5.9 ng/mL Final   SARS Coronavirus 2 Ag 08/14/2023 Negative  Negative Final  Office Visit on 07/31/2023  Component Date Value Ref Range Status   WBC 07/31/2023 7.0  4.0 - 10.5 K/uL Final   RBC 07/31/2023 4.75  4.22 - 5.81 Mil/uL Final   Hemoglobin 07/31/2023 14.5  13.0 - 17.0 g/dL Final   HCT 95/76/7974 43.5  39.0 - 52.0 % Final   MCV 07/31/2023 91.7  78.0 - 100.0 fl Final   MCHC 07/31/2023 33.3  30.0 - 36.0 g/dL Final   RDW 95/76/7974 14.0  11.5 - 15.5 % Final   Platelets 07/31/2023 167.0  150.0 - 400.0 K/uL Final   Neutrophils Relative % 07/31/2023 64.7  43.0 - 77.0 % Final   Lymphocytes Relative 07/31/2023 24.9  12.0 - 46.0 % Final   Monocytes Relative 07/31/2023 7.5  3.0 - 12.0 % Final   Eosinophils Relative 07/31/2023 2.3  0.0 - 5.0 % Final   Basophils Relative 07/31/2023 0.6  0.0 - 3.0 % Final   Neutro Abs 07/31/2023 4.5  1.4 - 7.7 K/uL Final   Lymphs Abs 07/31/2023 1.7  0.7 - 4.0 K/uL Final   Monocytes Absolute 07/31/2023 0.5  0.1 - 1.0 K/uL Final   Eosinophils Absolute 07/31/2023 0.2  0.0 - 0.7 K/uL Final   Basophils Absolute 07/31/2023 0.0  0.0 - 0.1 K/uL Final   Sodium 07/31/2023 138  135 - 145 mEq/L Final   Potassium 07/31/2023 4.2  3.5 - 5.1 mEq/L Final   Chloride 07/31/2023 104  96 - 112 mEq/L Final   CO2 07/31/2023 28  19 - 32 mEq/L Final   Glucose, Bld 07/31/2023 101 (H)  70 - 99 mg/dL Final   BUN 95/76/7974 27 (H)  6 - 23 mg/dL Final   Creatinine, Ser 07/31/2023 1.18  0.40 - 1.50 mg/dL Final   Total Bilirubin 07/31/2023 0.6  0.2 - 1.2 mg/dL Final   Alkaline Phosphatase 07/31/2023 59  39 - 117 U/L Final   AST 07/31/2023 16  0 - 37 U/L Final   ALT 07/31/2023 14  0 - 53 U/L Final   Total Protein 07/31/2023 6.7  6.0 - 8.3 g/dL Final   Albumin 95/76/7974 4.4  3.5 - 5.2 g/dL  Final   GFR 95/76/7974 58.03 (L)  >60.00 mL/min Final   Calcium  07/31/2023 8.8  8.4 - 10.5 mg/dL Final   Phosphorus 95/76/7974 2.6  2.3 - 4.6 mg/dL Final   VITD 95/76/7974 47.95  30.00 - 100.00 ng/mL Final   PTH 07/31/2023 49  16 - 77 pg/mL Final   Uric Acid, Serum 07/31/2023 5.8  4.0 - 7.8 mg/dL Final   Color, Urine 95/76/7974 YELLOW  YELLOW Final   APPearance 07/31/2023 CLEAR  CLEAR Final   Specific Gravity, Urine 07/31/2023 1.019  1.001 - 1.035 Final   pH 07/31/2023 6.0  5.0 - 8.0 Final   Glucose, UA 07/31/2023 NEGATIVE  NEGATIVE Final   Bilirubin Urine 07/31/2023 NEGATIVE  NEGATIVE Final   Ketones, ur 07/31/2023 NEGATIVE  NEGATIVE Final   Hgb urine dipstick 07/31/2023 NEGATIVE  NEGATIVE Final   Protein, ur 07/31/2023 NEGATIVE  NEGATIVE Final   Nitrites, Initial 07/31/2023 NEGATIVE  NEGATIVE Final   Leukocyte Esterase 07/31/2023 NEGATIVE  NEGATIVE Final   WBC, UA 07/31/2023 NONE SEEN  0 - 5 /HPF Final   RBC / HPF 07/31/2023 NONE SEEN  0 - 2 /HPF Final   Squamous Epithelial / HPF 07/31/2023 NONE SEEN  < OR = 5 /HPF Final   Bacteria, UA 07/31/2023 NONE SEEN  NONE SEEN /HPF Final   Hyaline Cast 07/31/2023 NONE SEEN  NONE SEEN /LPF Final   Note 07/31/2023    Final   Vitamin B-12 07/31/2023 483  211 - 911 pg/mL Final   Methylmalonic Acid, Quant 07/31/2023 106  85 - 423 nmol/L Final   Ceruloplasmin 07/31/2023 20  14 - 30 mg/dL Final   Zinc  07/31/2023 60  60 - 130 mcg/dL Final   Total Protein 95/76/7974 6.4  6.1 - 8.1 g/dL Final   Albumin ELP 95/76/7974 4.1  3.8 - 4.8 g/dL Final   Alpha 1 95/76/7974 0.2  0.2 - 0.3 g/dL Final   Alpha 2 95/76/7974 0.7  0.5 - 0.9 g/dL Final   Beta Globulin 95/76/7974 0.4  0.4 - 0.6 g/dL Final   Beta 2 95/76/7974 0.3  0.2 - 0.5 g/dL Final   Gamma Globulin 07/31/2023 0.7 (L)  0.8 - 1.7 g/dL Final   Abnormal Protein Band1 07/31/2023   NONE DETECTED g/dL Final   SPE Interp. 95/76/7974    Final   Protein, Ur 08/02/2023 9.3  Not Estab. mg/dL Final   Protein,  75Y Urine 08/02/2023 Comment  30 - 150 mg/24 hr Final   ALBUMIN, U 08/02/2023 100.0  % Final   ALPHA 1 URINE 08/02/2023 0.0  % Final   ALPHA-2-GLOBULIN, U 08/02/2023 0.0  % Final   % BETA, Urine 08/02/2023 0.0  % Final   GAMMA GLOBULIN URINE 08/02/2023 0.0  % Final   M-SPIKE, % 08/02/2023 Not Observed  Not Observed % Final   M-Spike, mg/24 hr 08/02/2023 Comment  Not Observed mg/24 hr Final   Immunofixation, Urine 08/02/2023 Comment   Final   NOTE: 08/02/2023 Comment   Final   Free Kappa Lt Chains,Ur 08/02/2023 45.86  1.17 - 86.46 mg/L Final   Free Lambda Lt Chains,Ur 08/02/2023 8.15  0.27 - 15.21 mg/L Final   Kappa/Lambda Ratio,U 08/02/2023 5.63  1.83 - 14.26 Final   ANCA SCREEN 07/31/2023 Negative  Negative Final   Sed Rate 07/31/2023 4  0 - 20 mm/hr Final   CRP 07/31/2023 <1.0  0.5 - 20.0 mg/dL Final   Reflexve Urine Culture 07/31/2023    Final  Office Visit on 03/27/2023  Component Date Value Ref Range Status   Vitamin B-12 03/27/2023 1,492 (H)  211 - 911 pg/mL Final   Folate 03/27/2023 16.0  >5.9 ng/mL Final   Hgb A1c MFr Bld 03/27/2023 5.9  4.6 - 6.5 % Final   Ferritin 03/27/2023 53.8  22.0 - 322.0 ng/mL Final   Sodium 03/27/2023 142  135 - 145 mEq/L Final   Potassium 03/27/2023 4.3  3.5 - 5.1 mEq/L Final   Chloride 03/27/2023 105  96 - 112 mEq/L Final   CO2 03/27/2023 30  19 - 32 mEq/L Final   Glucose, Bld 03/27/2023 109 (H)  70 - 99 mg/dL Final   BUN 87/81/7975 21  6 - 23 mg/dL Final   Creatinine, Ser 03/27/2023 1.03  0.40 - 1.50 mg/dL Final   Total Bilirubin 03/27/2023 0.5  0.2 - 1.2 mg/dL Final   Alkaline Phosphatase 03/27/2023 68  39 - 117 U/L Final   AST 03/27/2023 17  0 - 37 U/L Final   ALT 03/27/2023 14  0 - 53 U/L Final   Total Protein 03/27/2023 6.8  6.0 - 8.3 g/dL Final   Albumin 87/81/7975 4.5  3.5 - 5.2 g/dL Final   GFR 87/81/7975 68.48  >60.00 mL/min Final   Calcium  03/27/2023 8.9  8.4 - 10.5 mg/dL Final   WBC 87/81/7975 6.3  4.0 - 10.5 K/uL Final   RBC  03/27/2023 4.74  4.22 - 5.81 Mil/uL Final   Hemoglobin 03/27/2023 14.6  13.0 - 17.0 g/dL Final   HCT 87/81/7975 43.8  39.0 - 52.0 % Final   MCV 03/27/2023 92.3  78.0 - 100.0 fl Final   MCHC 03/27/2023 33.3  30.0 - 36.0 g/dL Final   RDW 87/81/7975 14.0  11.5 - 15.5 % Final   Platelets 03/27/2023 185.0  150.0 - 400.0 K/uL Final   Neutrophils Relative % 03/27/2023 60.8  43.0 - 77.0 % Final   Lymphocytes Relative 03/27/2023 27.2  12.0 - 46.0 % Final   Monocytes Relative 03/27/2023 9.0  3.0 - 12.0 % Final   Eosinophils Relative 03/27/2023 2.3  0.0 - 5.0 % Final   Basophils Relative 03/27/2023 0.7  0.0 - 3.0 % Final   Neutro Abs 03/27/2023 3.8  1.4 - 7.7 K/uL Final   Lymphs Abs 03/27/2023 1.7  0.7 - 4.0 K/uL Final   Monocytes Absolute 03/27/2023 0.6  0.1 - 1.0 K/uL Final   Eosinophils Absolute 03/27/2023 0.1  0.0 - 0.7 K/uL Final   Basophils Absolute 03/27/2023 0.0  0.0 - 0.1 K/uL Final   Cholesterol 03/27/2023 123  0 - 200 mg/dL Final   Triglycerides 87/81/7975 81.0  0.0 - 149.0 mg/dL Final   HDL 87/81/7975 41.00  >39.00 mg/dL Final   VLDL 87/81/7975 16.2  0.0 - 40.0 mg/dL Final   LDL Cholesterol 03/27/2023 65  0 - 99 mg/dL Final   Total CHOL/HDL Ratio 03/27/2023 3   Final   NonHDL 03/27/2023 81.51   Final   VITD 03/27/2023 34.36  30.00 - 100.00 ng/mL Final   Anti Nuclear Antibody (ANA) 03/27/2023 Negative  Negative Final   Rheumatoid fact SerPl-aCnc 03/27/2023 <10  <14 IU/mL Final   T3 Uptake 03/27/2023 28  22 - 35 % Final   T4, Total 03/27/2023 7.5  4.9 - 10.5 mcg/dL Final   Free Thyroxine Index 03/27/2023 2.1  1.4 - 3.8 Final   TSH 03/27/2023 1.05  0.40 - 4.50 mIU/L Final   Arsenic, 24H Ur 04/04/2023 <10  <=80 mcg/L Final   Lead, 24 hr urine 04/04/2023 <10  <80 mcg/L Final   Mercury, 24H Ur 04/04/2023 <4  <=20 mcg/L Final  Office Visit on 09/24/2022  Component Date Value Ref Range Status   Rheumatoid fact SerPl-aCnc 09/24/2022 <10  <14 IU/mL Final   Sed Rate 09/24/2022 2  0 - 20  mm/h Final   Scleroderma (Scl-70) (ENA) Antibod* 09/24/2022 <1.0 NEG  <1.0 NEG AI Final   Ribonucleic Protein(ENA) Antibody,* 09/24/2022 <1.0 NEG  <1.0 NEG AI Final  ENA SM Ab Ser-aCnc 09/24/2022 <1.0 NEG  <1.0 NEG AI Final   SSA (Ro) (ENA) Antibody, IgG 09/24/2022 <1.0 NEG  <1.0 NEG AI Final   ds DNA Ab 09/24/2022 1  IU/mL Final   C3 Complement 09/24/2022 124  82 - 185 mg/dL Final   C4 Complement 93/82/7975 21  15 - 53 mg/dL Final  Office Visit on 09/05/2022  Component Date Value Ref Range Status   WBC 09/05/2022 6.9  4.0 - 10.5 K/uL Final   RBC 09/05/2022 4.71  4.22 - 5.81 Mil/uL Final   Hemoglobin 09/05/2022 14.1  13.0 - 17.0 g/dL Final   HCT 94/70/7975 42.4  39.0 - 52.0 % Final   MCV 09/05/2022 90.2  78.0 - 100.0 fl Final   MCHC 09/05/2022 33.2  30.0 - 36.0 g/dL Final   RDW 94/70/7975 14.9  11.5 - 15.5 % Final   Platelets 09/05/2022 158.0  150.0 - 400.0 K/uL Final   Neutrophils Relative % 09/05/2022 62.2  43.0 - 77.0 % Final   Lymphocytes Relative 09/05/2022 25.1  12.0 - 46.0 % Final   Monocytes Relative 09/05/2022 9.9  3.0 - 12.0 % Final   Eosinophils Relative 09/05/2022 2.2  0.0 - 5.0 % Final   Basophils Relative 09/05/2022 0.6  0.0 - 3.0 % Final   Neutro Abs 09/05/2022 4.3  1.4 - 7.7 K/uL Final   Lymphs Abs 09/05/2022 1.7  0.7 - 4.0 K/uL Final   Monocytes Absolute 09/05/2022 0.7  0.1 - 1.0 K/uL Final   Eosinophils Absolute 09/05/2022 0.2  0.0 - 0.7 K/uL Final   Basophils Absolute 09/05/2022 0.0  0.0 - 0.1 K/uL Final   Ferritin 09/05/2022 29.4  22.0 - 322.0 ng/mL Final   Cholesterol 09/05/2022 123  0 - 200 mg/dL Final   Triglycerides 94/70/7975 160.0 (H)  0.0 - 149.0 mg/dL Final   HDL 94/70/7975 34.60 (L)  >60.99 mg/dL Final   VLDL 94/70/7975 32.0  0.0 - 40.0 mg/dL Final   LDL Cholesterol 09/05/2022 57  0 - 99 mg/dL Final   Total CHOL/HDL Ratio 09/05/2022 4   Final   NonHDL 09/05/2022 88.83   Final   Sodium 09/05/2022 142  135 - 145 mEq/L Final   Potassium 09/05/2022 4.4   3.5 - 5.1 mEq/L Final   Chloride 09/05/2022 107  96 - 112 mEq/L Final   CO2 09/05/2022 28  19 - 32 mEq/L Final   Glucose, Bld 09/05/2022 101 (H)  70 - 99 mg/dL Final   BUN 94/70/7975 19  6 - 23 mg/dL Final   Creatinine, Ser 09/05/2022 1.17  0.40 - 1.50 mg/dL Final   Total Bilirubin 09/05/2022 0.4  0.2 - 1.2 mg/dL Final   Alkaline Phosphatase 09/05/2022 73  39 - 117 U/L Final   AST 09/05/2022 18  0 - 37 U/L Final   ALT 09/05/2022 15  0 - 53 U/L Final   Total Protein 09/05/2022 6.5  6.0 - 8.3 g/dL Final   Albumin 94/70/7975 4.0  3.5 - 5.2 g/dL Final   GFR 94/70/7975 59.00 (L)  >60.00 mL/min Final   Calcium  09/05/2022 8.6  8.4 - 10.5 mg/dL Final   VITD 94/70/7975 46.57  30.00 - 100.00 ng/mL Final   Magnesium 09/05/2022 1.9  1.5 - 2.5 mg/dL Final   Phosphorus 94/70/7975 2.1 (L)  2.3 - 4.6 mg/dL Final   Uric Acid, Serum 09/05/2022 5.7  4.0 - 7.8 mg/dL Final  Scanned Document on 06/20/2022  Component Date Value Ref Range Status   HM  Colonoscopy 06/20/2022 See Report (in chart)  See Report (in chart), Patient Reported Final  Appointment on 06/04/2022  Component Date Value Ref Range Status   Weight 06/04/2022 2,848  oz Final   Height 06/04/2022 70  in Final   BP 06/04/2022 122/70  mmHg Final   S' Lateral 06/04/2022 2.15  cm Final   Area-P 1/2 06/04/2022 3.77  cm2 Final   MV M vel 06/04/2022 3.23  m/s Final   MV Peak grad 06/04/2022 41.6  mmHg Final   Est EF 06/04/2022 55 - 60%   Final  Hospital Outpatient Visit on 05/20/2022  Component Date Value Ref Range Status   Creatinine, Ser 05/20/2022 1.30 (H)  0.61 - 1.24 mg/dL Final  There may be more visits with results that are not included.  No image results found. DG Abd 2 Views Result Date: 10/12/2023 CLINICAL DATA:  constipation and abd discomfort. EXAM: ABDOMEN - 2 VIEW COMPARISON:  May 20, 2022 FINDINGS: Nonobstructive bowel gas pattern. Moderate volume fecal loading in the ascending colon. No pneumoperitoneum. No organomegaly or  radiopaque calculi. Cholecystectomy clips. No acute fracture or destructive lesion. The lung bases are clear.Multilevel degenerative disc disease of the spine. Diffuse aortic atherosclerosis. IMPRESSION: Nonobstructive bowel gas pattern. Moderate volume fecal loading in the ascending colon, as can be seen in constipation. Electronically Signed   By: Rogelia Myers M.D.   On: 10/12/2023 12:18   DG Chest 2 View Result Date: 10/08/2023 CLINICAL DATA:  History of pneumonia, worsening fatigue EXAM: CHEST - 2 VIEW COMPARISON:  Chest x-ray Aug 14, 2023 FINDINGS: The cardiomediastinal silhouette is unchanged in contour. Persistent left lower lobe opacity, decreased in conspicuity compared to Aug 14, 2023. No new focal pulmonary opacity. No pleural effusion or pneumothorax. The visualized upper abdomen is unremarkable. No acute osseous abnormality. IMPRESSION: Persistent but decreased left lower lobe opacity. Differential considerations include atelectasis versus resolving pneumonia. Electronically Signed   By: Dirk Arrant M.D.   On: 10/08/2023 13:25         ASSESSMENT & PLAN   Assessment & Plan Knee arthritis, bilateral Chronic bilateral knee pain from osteoarthritis worsens with prolonged standing and activities. Despite using knee pads, soreness persists. Schedule a knee injection for pain management. Dandruff Persistent dandruff and discomfort despite ketoconazole  shampoo use. Prescribe selenium  sulfide lotion to use with ketoconazole  shampoo. Refer to a dermatologist if no improvement. Drowsiness Likely due to poor sleep quality from restless legs and neuropathy. Caffeine use may contribute to restlessness and drowsiness. Discontinue caffeine tablets to assess his impact. Evaluate sleep quality and adjust treatment as needed. RLS (restless legs syndrome) This condition contributes to poor sleep quality. Discussed treatment options, including ropinirole  or pramipexole, with concerns about drowsiness  as a side effect. Prescribe ropinirole  or pramipexole at a low dose. Consider physical therapy for stretching and massage. Evaluate iron supplementation. Upset stomach Experiences intermittent indigestion despite pantoprazole  20 mg. Increase pantoprazole  to 40 mg daily for 30 days to manage indigestion. Sleep disturbances OSA on CPAP Uses a CPAP machine nightly but experiences poor sleep quality and daytime drowsiness. CPAP may not be fully effective due to other sleep disturbances. Idiopathic peripheral neuropathy Chronic neuropathy causes a burning sensation in the legs, affecting sleep. The 5% lidocaine  cream was effective, but the 4% over-the-counter version was not. Medication management Manages multiple chronic conditions and prefers to discontinue pill packs due to inconvenience. Provide a list of medications for review and adjustment. Encourage regular follow-up appointments to monitor and adjust treatment plans.  ORDER ASSOCIATIONS  #   DIAGNOSIS / CONDITION ICD-10 ENCOUNTER ORDER     ICD-10-CM   1. Knee arthritis, bilateral  M17.0     2. Dandruff  L21.0 selenium  sulfide (SELSUN ) 2.5 % lotion    Ambulatory referral to Dermatology    3. Drowsiness  R40.0     4. RLS (restless legs syndrome)  G25.81 rOPINIRole  (REQUIP ) 1 MG tablet    5. Upset stomach  K30 pantoprazole  (PROTONIX ) 40 MG tablet    6. Sleep disturbances  G47.9     7. Idiopathic peripheral neuropathy  G60.9     8. OSA on CPAP  G47.33     9. Medication management  Z79.899          Orders Placed in Encounter:   Referral Orders         Ambulatory referral to Dermatology    Meds ordered this encounter  Medications   selenium  sulfide (SELSUN ) 2.5 % lotion    Sig: Apply 1 Application topically daily as needed for irritation.    Dispense:  118 mL    Refill:  12   rOPINIRole  (REQUIP ) 1 MG tablet    Sig: Take 1 tablet (1 mg total) by mouth at bedtime. Start at 0.5 mg nightly, then after 1 week, up to 1 mg  nightly, for restless legs.    Dispense:  90 tablet    Refill:  4   pantoprazole  (PROTONIX ) 40 MG tablet    Sig: Take 1 tablet (40 mg total) by mouth daily.    Dispense:  30 tablet    Refill:  0    Orders Placed This Encounter  Procedures   Ambulatory referral to Dermatology    Referral Priority:   Routine    Referral Type:   Consultation    Referral Reason:   Specialty Services Required    Requested Specialty:   Dermatology    Number of Visits Requested:   1        This document was synthesized by artificial intelligence (Abridge) using HIPAA-compliant recording of the clinical interaction;   We discussed the use of AI scribe software for clinical note transcription with the patient, who gave verbal consent to proceed. additional Info: This encounter employed state-of-the-art, real-time, collaborative documentation. The patient actively reviewed and assisted in updating their electronic medical record on a shared screen, ensuring transparency and facilitating joint problem-solving for the problem list, overview, and plan. This approach promotes accurate, informed care. The treatment plan was discussed and reviewed in detail, including medication safety, potential side effects, and all patient questions. We confirmed understanding and comfort with the plan. Follow-up instructions were established, including contacting the office for any concerns, returning if symptoms worsen, persist, or new symptoms develop, and precautions for potential emergency department visits.

## 2023-12-19 NOTE — Assessment & Plan Note (Signed)
 Chronic neuropathy causes a burning sensation in the legs, affecting sleep. The 5% lidocaine  cream was effective, but the 4% over-the-counter version was not.

## 2023-12-20 ENCOUNTER — Telehealth: Payer: Self-pay | Admitting: Pharmacist

## 2023-12-20 ENCOUNTER — Other Ambulatory Visit (HOSPITAL_BASED_OUTPATIENT_CLINIC_OR_DEPARTMENT_OTHER): Payer: Self-pay

## 2023-12-20 NOTE — Telephone Encounter (Signed)
-----   Message from [name] sent at 12/19/2023  8:15 AM EDT ----- Patient reporting lots of issues with pill pack, reach out for improving convenience.

## 2023-12-20 NOTE — Telephone Encounter (Signed)
 Received a message from patient's PCP that Stephen Anderson was having difficulty with adherence packaging. He has tried adherence packs for the last 2 months and is dissatisfied because not all his medications are included in the adherence packs and also does not include all his vitamins.  His wife states that he would like medications sent back to [name] for 90 days supply.  I had a phone visit with patient in June 2025 and had explained to him at that time that the following would not be able to be included in adherence packs - aspirin (because of every other day dosing), pregabalin  (because it is a controlled medication), primidone  (because it is a controlled medication).  At that time patient had decided against adherence packs but must have changed his mind.  Will consult with PCP to get medications changed back to [name] for 90 day supply.

## 2023-12-21 ENCOUNTER — Other Ambulatory Visit (HOSPITAL_BASED_OUTPATIENT_CLINIC_OR_DEPARTMENT_OTHER): Payer: Self-pay

## 2023-12-24 ENCOUNTER — Other Ambulatory Visit: Payer: Self-pay

## 2023-12-24 ENCOUNTER — Other Ambulatory Visit: Payer: Self-pay | Admitting: Pharmacist

## 2023-12-24 NOTE — Progress Notes (Signed)
 12/24/2023 Name: Stephen Anderson MRN: [id number] DOB: 1940-08-23  Chief Complaint  Patient presents with   Medication Adherence    Stephen Anderson is a 83 y.o. year old male who presented for a telephone visit.   They were referred to the pharmacist by their PCP for assistance in managing complex medication management.    Subjective:  Care Team: Primary Care Provider: Jesus Bernardino MATSU, MD ; Next Scheduled Visit: 01/02/2024 ENT: Dr Karis -Last appointment: 12/10/2023  Medication Access/Adherence  Patient's PCP was concerned about medication adherence and patient had mentioned that he would like to have all his medications filled on the same day for 90 days supply. I had an initial visit with patient in June 2025 and at that time he had decided that he did not want to use adherence packs because aspirin (every OTHER day dosing), pregabalin  (controlled medication) could no be included in adherence pack.  Patient later changed his mind and tried adherence packs for the last 2 months but has not liked the adherence packs. He would like to change back to getting medications in bottles for 90 day supply at a time.  He last adherence packs were filled 12/19/2023.    Current Pharmacy:  [facility] [address] [place] [address] Phone: [phone] Fax: [phone]   Patient reports affordability concerns with their medications: No  Patient reports access/transportation concerns to their pharmacy: No  Patient reports adherence concerns with their medications:  No     Reviewed medications list with patient and refill history.   Objective:  Lab Results  Component Value Date   HGBA1C 5.9 03/27/2023    Lab Results  Component Value Date   CREATININE 1.49 10/07/2023   BUN 31 (H) 10/07/2023   NA 142 10/07/2023   K 4.1 10/07/2023   CL 104 10/07/2023   CO2 26 10/07/2023    Lab Results  Component Value Date   CHOL 123  03/27/2023   HDL 41.00 03/27/2023   LDLCALC 65 03/27/2023   LDLDIRECT 84.0 03/06/2022   TRIG 81.0 03/27/2023   CHOLHDL 3 03/27/2023    Medications Reviewed Today     Reviewed by Carla Milling, RPH-CPP (Pharmacist) on 12/24/23 at 1107  Med List Status: <None>   Medication Order Taking? Sig Documenting Provider Last Dose Status Informant  Acetaminophen (TYLENOL ARTHRITIS EXT RELIEF PO) [id number]  Take 1 tablet by mouth every other day. [provider]  Active            Med Note BLASE LAYMON CHRISTELLA   Tue May 24, 2020  7:52 AM)    Alpha-Lipoic Acid 300 MG TABS [id number]  Take 2 tablets (600 mg total) by mouth daily at 6 (six) AM. Stephen Bernardino MATSU, MD  Active   Ascorbic Acid (VITAMIN C PO) [id number]  Take 500 mg by mouth daily. [provider]  Active   aspirin 81 MG tablet [id number]  Take 81 mg by mouth every other day.  [provider]  Active   atorvastatin  (LIPITOR) 40 MG tablet [id number]  Take 1 tablet (40 mg total) by mouth at bedtime. add to pill packs if possible Stephen Bernardino MATSU, MD  Active   caffeine 200 MG TABS tablet [id number]  Take 200 mg by mouth in the morning. STAY AWAKE  Patient not taking: Reported on 12/24/2023   [provider]  Active   celecoxib  (CELEBREX ) 100 MG capsule [id number]  Take 1 capsule (100 mg total) by  mouth 2 (two) times daily. Replaces 200 mg dose, trial step down to minimize pills Stephen Bernardino MATSU, MD  Active   Cholecalciferol  (VITAMIN D -3) 125 MCG (5000 UT) TABS [id number]  Take 1 tablet by mouth daily. Stephen Bernardino MATSU, MD  Active   cilostazol  (PLETAL ) 50 MG tablet [id number]  Take 1 tablet (50 mg total) by mouth 2 (two) times daily. Stephen Bernardino MATSU, MD  Active   Cyanocobalamin  (B-12 PO) [id number]  Take 1 tablet by mouth daily. [provider]  Active   diclofenac  Sodium (VOLTAREN ) 1 % GEL [id number]  Apply 2 g topically in the morning and at bedtime. Stephen Bernardino MATSU, MD  Active   fluticasone  (FLONASE ) 50  MCG/ACT nasal spray [id number]  Place 1 spray into both nostrils daily as needed. Stephen Bernardino MATSU, MD  Active   ketoconazole  (NIZORAL ) 2 % shampoo [id number]  Apply to the affected area(s) and let sit 3-5 minutes before rinsing. Stephen Bernardino MATSU, MD  Active   lidocaine  (XYLOCAINE ) 5 % ointment [id number]  Apply to the affected area(s) topically as needed. Stephen Bernardino MATSU, MD  Active   NIFEdipine  (PROCARDIA -XL/NIFEDICAL-XL) 30 MG 24 hr tablet [id number]  Take 1 tablet (30 mg total) by mouth daily. Stephen Bernardino MATSU, MD  Active   Stephen Anderson [id number]  CPAP [provider]  Active   omega-3 acid ethyl esters (LOVAZA ) 1 g capsule [id number]  Take 2 capsules (2 g total) by mouth 2 (two) times daily. Stephen Bernardino MATSU, MD  Active   pantoprazole  (PROTONIX ) 20 MG tablet [id number]  Take 1 tablet (20 mg total) by mouth daily. Replaces 40 mg dose, step down dose trial. Stephen Bernardino MATSU, MD  Active   pantoprazole  (PROTONIX ) 40 MG tablet [id number]  Take 1 tablet (40 mg total) by mouth daily. Stephen Bernardino MATSU, MD  Active   pregabalin  (LYRICA ) 200 MG capsule [id number]  Take 1 capsule (200 mg total) by mouth 2 (two) times daily. Stephen Bernardino MATSU, MD  Active   primidone  (MYSOLINE ) 50 MG tablet [id number]  Take 1 tablet (50 mg total) by mouth 2 (two) times daily in morning and evening. Stephen Bernardino MATSU, MD  Active   rOPINIRole  (REQUIP ) 1 MG tablet [id number]  Take 1 tablet (1 mg total) by mouth at bedtime. Start at 0.5 mg nightly, then after 1 week, up to 1 mg nightly, for restless legs. Stephen Bernardino MATSU, MD  Active   selenium  sulfide (SELSUN ) 2.5 % lotion [id number]  Apply 1 Application topically daily as needed for irritation. Stephen Bernardino MATSU, MD  Active   triamcinolone  cream (KENALOG ) 0.1 % [id number]  Apply to the affected area(s) topically 2 (two) times daily. Use for up to 14 days. Stephen Bernardino MATSU, MD  Active               Assessment/Plan:   Medication Management: - Currently strategy insufficient  - patient did not like using adherence packs.  - He is not due to refill meds yet but will assist with filling for 90 days in September at [facility] [place]. Patient will pick up at pharmacy. Did not want delivered.   Follow Up Plan: 2 to 3 weeks to help with October refills.   Madelin Ray, PharmD Clinical Pharmacist [facility]

## 2023-12-26 ENCOUNTER — Other Ambulatory Visit (HOSPITAL_BASED_OUTPATIENT_CLINIC_OR_DEPARTMENT_OTHER): Payer: Self-pay

## 2023-12-30 ENCOUNTER — Other Ambulatory Visit (HOSPITAL_BASED_OUTPATIENT_CLINIC_OR_DEPARTMENT_OTHER): Payer: Self-pay

## 2023-12-30 ENCOUNTER — Other Ambulatory Visit: Payer: Self-pay

## 2024-01-01 ENCOUNTER — Other Ambulatory Visit (HOSPITAL_BASED_OUTPATIENT_CLINIC_OR_DEPARTMENT_OTHER): Payer: Self-pay

## 2024-01-02 ENCOUNTER — Encounter: Payer: Self-pay | Admitting: Internal Medicine

## 2024-01-02 ENCOUNTER — Ambulatory Visit: Admitting: Internal Medicine

## 2024-01-02 ENCOUNTER — Other Ambulatory Visit (HOSPITAL_BASED_OUTPATIENT_CLINIC_OR_DEPARTMENT_OTHER): Payer: Self-pay

## 2024-01-02 VITALS — BP 112/60 | HR 68 | Temp 98.0°F | Ht 70.5 in | Wt 181.4 lb

## 2024-01-02 DIAGNOSIS — G629 Polyneuropathy, unspecified: Secondary | ICD-10-CM | POA: Diagnosis not present

## 2024-01-02 DIAGNOSIS — F5101 Primary insomnia: Secondary | ICD-10-CM

## 2024-01-02 DIAGNOSIS — E663 Overweight: Secondary | ICD-10-CM

## 2024-01-02 DIAGNOSIS — R0981 Nasal congestion: Secondary | ICD-10-CM

## 2024-01-02 DIAGNOSIS — R400 Somnolence: Secondary | ICD-10-CM | POA: Diagnosis not present

## 2024-01-02 DIAGNOSIS — G4733 Obstructive sleep apnea (adult) (pediatric): Secondary | ICD-10-CM | POA: Diagnosis not present

## 2024-01-02 MED ORDER — TIRZEPATIDE-WEIGHT MANAGEMENT 2.5 MG/0.5ML ~~LOC~~ SOAJ
2.5000 mg | SUBCUTANEOUS | 11 refills | Status: DC
  Filled 2024-01-02 – 2024-02-07 (×5): qty 2, 28d supply, fill #0

## 2024-01-02 NOTE — Assessment & Plan Note (Signed)
 Peripheral neuropathy   Peripheral neuropathy is managed with pregabalin  (Lyrica ) 200 mg twice daily. He reports improvement in symptoms with the current regimen and does not wish to increase the dosage. He should continue pregabalin  (Lyrica ) 200 mg twice daily.  Knee osteoarthritis, bilateral   Bilateral knee osteoarthritis is managed with topical diclofenac  (Voltaren ). He reports significant improvement in symptoms with the current use of Voltaren  on multiple joints. He should continue diclofenac  (Voltaren ) 2 g topically in the morning and at bedtime.

## 2024-01-02 NOTE — Assessment & Plan Note (Signed)
 Obstructive sleep apnea with CPAP noncompliance and persistent drowsiness and memory impairment   He experiences obstructive sleep apnea with CPAP noncompliance due to mask leak issues, likely worsened by nasal congestion and beard interference. Persistent drowsiness and memory impairment may result from ineffective CPAP therapy. The impact of nasal congestion and mask fit on CPAP efficacy was discussed, along with surgical options to improve nasal airflow and CPAP effectiveness, such as nasal tissue reduction and somnoplasty. The Inspire procedure was considered as an alternative to CPAP. Addressing these issues is crucial for improving overall health and cognitive function. He will be referred to ENT for evaluation of nasal airflow and potential surgical interventions. The Inspire procedure will be discussed with ENT as an alternative to CPAP. He should continue nasal rinses every morning and nasal spray at night, and attempt to improve CPAP mask fit by considering different mask types. Weight gain   He has gained 5-6 pounds since the last visit, attributed to increased food intake and decreased physical activity. The potential use of Zepbound  for weight management was discussed, which may also aid in sleep apnea management and improve memory. Zepbound  could offer additional health benefits, including kidney protection and cholesterol improvement. Efforts will be made to obtain insurance coverage for Zepbound  to aid in weight management and sleep apnea treatment.

## 2024-01-02 NOTE — Patient Instructions (Addendum)
 It was a pleasure seeing you today! Your health and satisfaction are our top priorities.  Stephen Cone, MD  VISIT SUMMARY: During your visit, we discussed your ongoing issues with sleep apnea, drowsiness, weight gain, neuropathy, and knee osteoarthritis. We reviewed your current treatments and made some adjustments to help improve your symptoms and overall health.  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 Your persistent drowsiness and memory issues may be due to mask leaks and nasal congestion affecting your CPAP therapy. We discussed improving your CPAP mask fit, continuing nasal rinses and sprays, and considering surgical options to improve nasal airflow. You will be referred to an ENT specialist to explore these options and the Inspire procedure as an alternative to CPAP.  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 This may be worsened by environmental factors like concrete dust. You should continue using nasal rinses every morning and a nasal spray at night. We will refer you to an ENT specialist to evaluate and possibly perform surgery to improve your nasal airflow.  -WEIGHT GAIN: You have gained 5-6 pounds since your last visit, likely due to increased food intake and decreased physical activity. We discussed the potential use of Zepbound  for weight management, which may also help with sleep apnea and improve memory. We will work on [employer] account planner for Zepbound .  -PERIPHERAL NEUROPATHY: Peripheral neuropathy is a condition that causes weakness, numbness, and pain in your extremities. You are currently managing this with pregabalin  (Lyrica ) 200 mg twice daily, which you find effective. Continue with your  current dosage.  -KNEE OSTEOARTHRITIS, BILATERAL: Knee osteoarthritis is a condition that causes joint pain and stiffness. You are managing this with topical diclofenac  (Voltaren ), which you find helpful. Continue applying 2 grams of Voltaren  in the morning and at bedtime.  INSTRUCTIONS: You will be referred to an ENT specialist to evaluate your nasal airflow and discuss potential surgical options, including the Inspire procedure. We will also work on obtaining insurance coverage for Zepbound  to help with weight management. Continue with your current treatments: nasal rinses every morning, nasal spray at night, pregabalin  (Lyrica ) 200 mg twice daily, and diclofenac  (Voltaren ) 2 grams topically in the morning and at bedtime.  Your Providers PCP: [name], MD,  [phone]) Referring Provider: [name], MD,  [phone]) Care Team Provider: Evonnie Asberry RAMAN, DO,  [phone]) Care Team Provider: Neysa Reggy BIRCH, MD,  [phone]) Care Team Provider: Joshua Barters Care Team Provider: Gerome Charleston, MD,  [phone]) Care Team Provider: Donnald Charleston, MD,  [phone]) Care Team Provider: Leslee Reusing, MD,  [phone]) Care Team Provider: Leslee Reusing, MD,  [phone]) Care Team Provider: Livingston Rigg, MD,  [phone]) Care Team Provider: Porter Andrez SAUNDERS, PA-C Care Team Provider: Lonni Slain, MD,  [phone]) Care Team Provider: Dianna Specking, MD,  [phone]) Care Team Provider: Jeannetta Lonni ORN, MD,  [phone]) Care Team Provider: Karis Clunes, MD,  [phone])  NEXT STEPS: [x]  Early Intervention: Schedule sooner appointment, call our on-call services, or go to emergency room if there is any significant Increase in pain or discomfort New or worsening symptoms Sudden or severe changes in your health [x]  Flexible Follow-Up: We recommend a No follow-ups on file. for optimal routine care. This allows for progress  monitoring and treatment adjustments. [x]  Preventive Care: Schedule your annual  preventive care visit! It's typically covered by insurance and helps identify potential health issues early. [x]  Lab & X-ray Appointments: Incomplete tests scheduled today, or call to schedule. X-rays: [facility] (M-F, 8:30am-noon or 1pm-5pm). [x]  Medical Information Release: Sign a release form at front desk to obtain relevant medical information we don't have.  MAKING THE MOST OF OUR FOCUSED 20 MINUTE APPOINTMENTS: [x]   Clearly state your top concerns at the beginning of the visit to focus our discussion [x]   If you anticipate you will need more time, please inform the front desk during scheduling - we can book multiple appointments in the same week. [x]   If you have transportation problems- use our convenient video appointments or ask about transportation support. [x]   We can get down to business faster if you use MyChart to update information before the visit and submit non-urgent questions before your visit. Thank you for taking the time to provide details through MyChart.  Let our nurse know and she can import this information into your encounter documents.  Arrival and Wait Times: [x]   Arriving on time ensures that everyone receives prompt attention. [x]   Early morning (8a) and afternoon (1p) appointments tend to have shortest wait times. [x]   Unfortunately, we cannot delay appointments for late arrivals or hold slots during phone calls.  Getting Answers and Following Up [x]   Simple Questions & Concerns: For quick questions or basic follow-up after your visit, reach us  at [phone] or MyChart messaging. [x]   Complex Concerns: If your concern is more complex, scheduling an appointment might be best. Discuss this with the staff to find the most suitable option. [x]   Lab & Imaging Results: We'll contact you directly if results are abnormal or you don't use MyChart. Most normal results will be  on MyChart within 2-3 business days, with a review message from Dr. Jesus. Haven't heard back in 2 weeks? Need results sooner? Contact us  at [phone]. [x]   Referrals: Our referral coordinator will manage specialist referrals. The specialist's office should contact you within 2 weeks to schedule an appointment. Call us  if you haven't heard from them after 2 weeks.  Staying Connected [x]   MyChart: Activate your MyChart for the fastest way to access results and message us . See the last page of this paperwork for instructions on how to activate.  Bring to Your Next Appointment [x]   Medications: Please bring all your medication bottles to your next appointment to ensure we have an accurate record of your prescriptions. [x]   Health Diaries: If you're monitoring any health conditions at home, keeping a diary of your readings can be very helpful for discussions at your next appointment.  Billing [x]   X-ray & Lab Orders: These are billed by separate companies. Contact the invoicing company directly for questions or concerns. [x]   Visit Charges: Discuss any billing inquiries with our administrative services team.  Your Satisfaction Matters [x]   Share Your Experience: We strive for your satisfaction! If you have any complaints, or preferably compliments, please let Dr. Jesus know directly or contact our Practice Administrators, Manuelita Rubin or [employer], by asking at the front desk.   Reviewing Your Records [x]   Review this early draft of your clinical encounter notes below and the final encounter summary tomorrow on MyChart after its been completed.  All orders placed so far are visible here: Drowsiness -     Ambulatory referral to ENT  OSA on CPAP -     Tirzepatide -Weight Management; Inject 2.5 mg  into the skin once a week.  Dispense: 2 mL; Refill: 11 -     Ambulatory referral to ENT  Small fiber neuropathy -     Ambulatory referral to ENT  Primary insomnia -     Ambulatory  referral to ENT  Overweight -     Tirzepatide -Weight Management; Inject 2.5 mg into the skin once a week.  Dispense: 2 mL; Refill: 11  Nasal congestion         ALLERGY MANAGEMENT PLAN  This plan is designed to help manage your allergic rhinitis (nasal allergies) effectively. Follow these steps daily for best results.  Sinus saline sprays- use nightly, and after sneezing episodes or exposure to allergen.  Insert deeply and spray mist into nose while leaning over sink at 45 degrees,  while gently breathing. Also blow out onto tissue while leaning forward 45 degrees. Once daily, after a sinus rinse, use sensimist.  Just before bedtime is best. This only needed if allergies acting up.  If this is inadequate add-on once daily for levocetirizine / xyzal 5 mg for nondrowsy antihistamine Take benadryl 25 mg at bedtime also if allergic mucus is persisting  When allergies cause chronic swelling in sinuses, it leads to sinus infections:    DAILY TREATMENT ROUTINE   Time of Day Treatment Steps  Morning 1. Saline Nasal Spray - Use to cleanse nasal passages 2. Xyzal (levocetirizine) - Take one tablet daily   Throughout Day Saline Nasal Spray - Use 2 additional times (mid-day and afternoon)   Evening/Bedtime 1. Saline Nasal Rinse - Thoroughly clean nasal passages 2. Flonase  Sensimist - Apply after nasal rinse 3. Benadryl (diphenhydramine) - Take 25mg  if experiencing persistent congestion    PROPER TECHNIQUE GUIDE       Saline Nasal Spray/Rinse Technique: Lean forward over sink at a 45-degree angle Turn head slightly to one side Insert spray tip into upper nostril Spray gently while breathing lightly through your nose Repeat on other side Gently blow nose to clear excess solution Use saline spray 3 times daily to keep nasal passages moist and clear allergens.       Flonase  Sensimist Technique: Shake bottle gently before each use Prime the bottle if it's new or hasn't been used for a  week Tilt your head forward slightly Insert tip into nostril, pointing away from the center of your nose Spray while inhaling gently Repeat in other nostril Use Flonase  Sensimist once daily, preferably at bedtime after using saline rinse. It may take several days of regular use to feel maximum benefit.   WHY FLONASE  SENSIMIST?   Benefits of Flonase  Sensimist:  Alcohol-free and scent-free formula - gentler on sensitive nasal passages Fine mist application - more comfortable with less dripping down throat Effectively relieves nasal congestion, sneezing, runny nose, and even eye symptoms 24-hour relief with once-daily dosing Uses a more potent form of fluticasone  that works at a lower dose Less liquid per spray means less discomfort  UNDERSTANDING YOUR MEDICATIONS   Medication How It Works Important Notes  Flonase  Sensimist (fluticasone  furoate) Reduces inflammation in nasal passages, addressing the underlying cause of allergy symptoms - Takes several days for full effect - Use daily for best results - Safe for long-term use   Xyzal (levocetirizine) Blocks histamine to reduce allergy symptoms like sneezing and itching - Take at the same time each day - May cause drowsiness in some people - Once-daily dosing   Benadryl (diphenhydramine) Antihistamine that provides additional relief for breakthrough symptoms - Causes drowsiness -  Use only at bedtime - For occasional use when needed   Saline Spray/Rinse Physically removes allergens and moistens nasal passages - Safe to use frequently - Improves effectiveness of other treatments - Reduces nasal irritation    CONTACT YOUR PROVIDER IF: Your symptoms do not improve after 1-2 weeks of following this plan You develop sinus pain with fever or green/yellow discharge You experience frequent nosebleeds You develop new or worsening symptoms You have questions about your treatment plan     ADDITIONAL ALLERGY MANAGEMENT TIPS   HELPFUL  STRATEGIES: ?? Keep windows closed during high pollen seasons ??? Use allergen-proof covers for pillows and mattresses ?? Vacuum regularly with a HEPA filter vacuum ?? Shower and change clothes after spending time outdoors ?? Check local pollen counts and limit outdoor time when counts are high ?? Stay well-hydrated to help keep mucous membranes moist

## 2024-01-02 NOTE — Progress Notes (Signed)
 ==============================  [facility]: [phone]   -- Medical Office Visit --  Patient: Stephen Anderson      Age: 83 y.o.       Sex:  male  Date:   01/02/2024 Today's Healthcare Provider: Bernardino KANDICE Cone, MD  ==============================   Chief Complaint: Medication Problem (Pt is here to review medication really tried a lot still can not even stay awake to watch tv. Has gain 6lbs he said but he has been eating well.)  Discussed the use of AI scribe software for clinical note transcription with the patient, who gave verbal consent to proceed.  History of Present Illness  83 year old male with sleep apnea and neuropathy who presents with drowsiness and weight gain.  He experiences persistent drowsiness despite regular use of his CPAP machine. He attributes this to a mask leak issue, and mentions that he has a beard and moves during sleep. He uses a water bath humidifier with the CPAP and sometimes finds it empty, suggesting a possible leak. He has a history of sleep apnea and last consulted a sleep doctor in early 2025. He also has a family history of drowsiness, noting that his father and uncles had similar symptoms.  He has neuropathy and takes Lyrica  at a dose of 100-200 mg twice daily, which he finds effective in managing his symptoms. Additionally, he applies Voltaren  gel on his ankles, toes, feet, and knees, which helps reduce burning sensations.  He has gained five to six pounds since his last visit, attributing it to good eating habits and reduced physical activity. He attempts to stay active by going to the [facility] and engaging in activities like fishing and yard work. He wants to lose the gained weight by increasing his physical activity.  He uses a CPAP machine consistently and has a history of nasal stuffiness, which he manages with nasal rinses every morning and a prescription nasal spray at night.  He has a history of Raynaud's  phenomenon and nerve damage. He recalls being drowsy even in his twenties, often falling asleep during movies or after work. He attributes some of his drowsiness to a family trait, as his relatives also experienced similar symptoms.  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, not believe to be due to lupus or systemic sclerosis; Diverticular disease; Right inguinal hernia; Aortic atherosclerosis; Bronchiectasis (HCC); Recurrent kidney stones; Calcification of prostate; DDD (degenerative disc disease), lumbar; LVH (left ventricular hypertrophy); Grade I diastolic dysfunction; Weight loss; Gastritis; Hiatal hernia; Lumbar back pain; Arthritis of both hands; Trigger finger, right ring finger; Chronic kidney disease, stage 2 (mild); Prediabetes; 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 Past Surgical History:   has a past surgical history that includes Gallbladder surgery; Hernia repair; Cataract extraction, bilateral; and Melanoma excision. Social History:   reports that he quit smoking about 15 years ago. His smoking use included cigarettes. He started smoking about 65 years ago. He has a 50 pack-year smoking history. He has never been exposed to tobacco smoke. He quit smokeless tobacco use about 21 years ago. He reports that he does not drink alcohol and does not use drugs. Family History:  family history includes Bladder Cancer in his brother; Heart disease in his brother; Lung cancer in his father; Neuropathy in his daughter. Allergies:  is allergic to hydrocodone, oxycodone, oxycontin [oxycodone hcl], and simvastatin.   Medication Reconciliation: Current Outpatient Medications on File Prior to Visit  Medication Sig   Acetaminophen (TYLENOL ARTHRITIS EXT RELIEF PO) Take 1 tablet by mouth every other day.   Alpha-Lipoic Acid 300 MG TABS Take 2 tablets (600 mg total) by mouth daily at 6 (six) AM.   Ascorbic Acid (VITAMIN C PO) Take 500 mg by mouth daily.   aspirin 81 MG tablet Take 81 mg by mouth every other day.    atorvastatin  (LIPITOR) 40 MG tablet Take 1 tablet (40 mg total) by mouth at bedtime. add to pill packs if possible   caffeine 200 MG TABS tablet Take 200 mg by mouth in the morning. STAY AWAKE (Patient not taking: Reported on 12/24/2023)   celecoxib  (CELEBREX ) 100 MG capsule Take 1 capsule (100 mg total) by mouth 2 (two) times daily. Replaces 200 mg dose, trial step down to  minimize pills   Cholecalciferol  (VITAMIN D -3) 125 MCG (5000 UT) TABS Take 1 tablet by mouth daily.   cilostazol  (PLETAL ) 50 MG tablet Take 1 tablet (50 mg total) by mouth 2 (two) times daily.   Cyanocobalamin  (B-12 PO) Take 1 tablet by mouth daily.   diclofenac  Sodium (VOLTAREN ) 1 % GEL Apply 2 g topically in the morning and at bedtime.   fluticasone  (FLONASE ) 50 MCG/ACT nasal spray Place 1 spray into both nostrils daily as needed.   ketoconazole  (NIZORAL ) 2 % shampoo Apply to the affected area(s) and let sit 3-5 minutes before rinsing.   lidocaine  (XYLOCAINE ) 5 % ointment Apply to the affected area(s) topically as needed.   NIFEdipine  (PROCARDIA -XL/NIFEDICAL-XL) 30 MG 24 hr tablet Take 1 tablet (30 mg total) by mouth daily.   NON FORMULARY CPAP   omega-3 acid ethyl esters (LOVAZA ) 1 g capsule Take 2 capsules (2 g total) by mouth 2 (two) times daily.   pantoprazole  (PROTONIX ) 20 MG tablet Take 1 tablet (20 mg total) by mouth daily. Replaces 40 mg dose, step down dose trial.   pantoprazole  (PROTONIX ) 40 MG tablet Take 1 tablet (40 mg total) by mouth daily.   pregabalin  (LYRICA ) 200 MG capsule Take 1 capsule (200 mg total) by mouth 2 (two) times daily.   primidone  (MYSOLINE ) 50 MG tablet Take 1 tablet (50 mg total) by mouth 2 (two) times daily in morning and evening.   rOPINIRole  (REQUIP ) 1 MG tablet Take 1 tablet (1 mg total) by mouth at bedtime. Start at 0.5 mg nightly, then after 1 week, up to 1 mg nightly, for restless legs.   selenium  sulfide (SELSUN )  2.5 % lotion Apply 1 Application topically daily as needed for irritation.   triamcinolone  cream (KENALOG ) 0.1 % Apply to the affected area(s) topically 2 (two) times daily. Use for up to 14 days.   No current facility-administered medications on file prior to visit.  There are no discontinued medications.   Physical Exam:    01/02/2024   11:20 AM 12/19/2023    7:51 AM 12/10/2023    4:26 PM  Vitals with BMI  Height 5' 10.5 5' 10.5    Weight 181 lbs 6 oz 174 lbs 6 oz   BMI 25.65 24.66   Systolic 112 110 872  Diastolic 60 78 70  Pulse 68 78 66  Vital signs reviewed.  Nursing notes reviewed. Weight trend reviewed. Physical Activity: Not on file   General Appearance:  No acute distress appreciable.   Well-groomed, healthy-appearing male.  Well proportioned with no abnormal fat distribution.  Good muscle tone. Pulmonary:  Normal work of breathing at rest, no respiratory distress apparent. SpO2: 98 %  Musculoskeletal: All extremities are intact.  Neurological:  Awake, alert, oriented, and engaged.  No obvious focal neurological deficits or cognitive impairments.  Sensorium seems unclouded.   Speech is clear and coherent with logical content. Psychiatric:  Appropriate mood, pleasant and cooperative demeanor, thoughtful and engaged during the exam   Verbalized to patient: Physical Exam    Results:   Verbalized to patient: Results DIAGNOSTIC Electrocardiogram (ECG): Normal     05/17/2023    8:48 AM 05/01/2023    8:35 AM 03/27/2023    8:50 AM 01/16/2023    8:45 AM  PHQ 2/9 Scores  PHQ - 2 Score 0 0 0 1  PHQ- 9 Score  2 1 4    Office Visit on 10/07/2023  Component Date Value Ref Range Status   WBC 10/07/2023 8.0  4.0 - 10.5 K/uL Final   RBC 10/07/2023 4.90  4.22 - 5.81 Mil/uL Final   Hemoglobin 10/07/2023 14.8  13.0 - 17.0 g/dL Final   HCT 93/69/7974 44.0  39.0 - 52.0 % Final   MCV 10/07/2023 89.9  78.0 - 100.0 fl Final   MCHC 10/07/2023 33.6  30.0 - 36.0 g/dL Final   RDW 93/69/7974 14.1  11.5 - 15.5 % Final   Platelets 10/07/2023 227.0  150.0 - 400.0 K/uL Final   Neutrophils Relative % 10/07/2023 58.8  43.0 - 77.0 % Final   Lymphocytes Relative 10/07/2023 27.5  12.0 - 46.0 % Final   Monocytes Relative 10/07/2023 10.4  3.0 - 12.0 % Final   Eosinophils Relative 10/07/2023 2.2  0.0 - 5.0 % Final   Basophils Relative 10/07/2023 1.1  0.0 - 3.0 % Final   Neutro Abs 10/07/2023 4.7  1.4 - 7.7 K/uL Final   Lymphs  Abs 10/07/2023 2.2  0.7 - 4.0 K/uL Final   Monocytes Absolute 10/07/2023 0.8  0.1 - 1.0 K/uL Final   Eosinophils Absolute 10/07/2023 0.2  0.0 - 0.7 K/uL Final   Basophils Absolute 10/07/2023 0.1  0.0 - 0.1 K/uL Final   Sodium 10/07/2023 142  135 - 145 mEq/L Final   Potassium 10/07/2023 4.1  3.5 - 5.1 mEq/L Final   Chloride 10/07/2023 104  96 - 112 mEq/L Final   CO2 10/07/2023 26  19 - 32 mEq/L Final   Glucose, Bld 10/07/2023 94  70 - 99 mg/dL Final   BUN 93/69/7974 31 (H)  6 - 23 mg/dL Final   Creatinine, Ser 10/07/2023 1.49  0.40 - 1.50 mg/dL Final  Total Bilirubin 10/07/2023 0.5  0.2 - 1.2 mg/dL Final   Alkaline Phosphatase 10/07/2023 82  39 - 117 U/L Final   AST 10/07/2023 26  0 - 37 U/L Final   ALT 10/07/2023 40  0 - 53 U/L Final   Total Protein 10/07/2023 7.1  6.0 - 8.3 g/dL Final   Albumin 93/69/7974 4.6  3.5 - 5.2 g/dL Final   GFR 93/69/7974 43.80 (L)  >60.00 mL/min Final   Calcium  10/07/2023 9.4  8.4 - 10.5 mg/dL Final   Sed Rate 93/69/7974 7  0 - 20 mm/hr Final   Pro B Natriuretic peptide (BNP) 10/07/2023 12.0  0.0 - 100.0 pg/mL Final   Vitamin B-12 10/07/2023 1,138 (H)  211 - 911 pg/mL Final   Folate 10/07/2023 >23.2  >5.9 ng/mL Final   Rheumatoid fact SerPl-aCnc 10/07/2023 <10  <14 IU/mL Final   Anti Nuclear Antibody (ANA) 10/07/2023 POSITIVE (A)  NEGATIVE Final   ANA Titer 1 10/07/2023 1:40 (H)  titer Final   ANA Pattern 1 10/07/2023 Nuclear, Homogeneous (A)   Final  Office Visit on 08/21/2023  Component Date Value Ref Range Status   MICRO NUMBER: 08/21/2023 [id number]   Final   SPECIMEN QUALITY: 08/21/2023 Adequate   Final   Source: 08/21/2023 NOT GIVEN   Final   STATUS: 08/21/2023 FINAL   Final   GRAM STAIN: 08/21/2023 No epithelial cells seen No white blood cells seen No organisms seen   Final   ANA RESULT: 08/21/2023 No anaerobes isolated.   Final   MICRO NUMBER: 08/21/2023 [id number]   Final   SPECIMEN QUALITY: 08/21/2023 Adequate   Final   SOURCE: 08/21/2023 NOT  GIVEN   Final   STATUS: 08/21/2023 FINAL   Final   AER RESULT: 08/21/2023 No Growth   Final   COMMENT: 08/21/2023 No source was provided. The specimen was tested and reported based upon the test code ordered. If this is incorrect, please contact client services.   Final  Office Visit on 08/14/2023  Component Date Value Ref Range Status   WBC 08/14/2023 11.4 (H)  4.0 - 10.5 K/uL Final   RBC 08/14/2023 4.23  4.22 - 5.81 Mil/uL Final   Hemoglobin 08/14/2023 13.0  13.0 - 17.0 g/dL Final   HCT 94/92/7974 38.3 (L)  39.0 - 52.0 % Final   MCV 08/14/2023 90.5  78.0 - 100.0 fl Final   MCHC 08/14/2023 33.9  30.0 - 36.0 g/dL Final   RDW 94/92/7974 13.9  11.5 - 15.5 % Final   Platelets 08/14/2023 228.0  150.0 - 400.0 K/uL Final   Neutrophils Relative % 08/14/2023 79.4 (H)  43.0 - 77.0 % Final   Lymphocytes Relative 08/14/2023 13.4  12.0 - 46.0 % Final   Monocytes Relative 08/14/2023 6.2  3.0 - 12.0 % Final   Eosinophils Relative 08/14/2023 0.2  0.0 - 5.0 % Final   Basophils Relative 08/14/2023 0.8  0.0 - 3.0 % Final   Neutro Abs 08/14/2023 9.0 (H)  1.4 - 7.7 K/uL Final   Lymphs Abs 08/14/2023 1.5  0.7 - 4.0 K/uL Final   Monocytes Absolute 08/14/2023 0.7  0.1 - 1.0 K/uL Final   Eosinophils Absolute 08/14/2023 0.0  0.0 - 0.7 K/uL Final   Basophils Absolute 08/14/2023 0.1  0.0 - 0.1 K/uL Final   Sodium 08/14/2023 137  135 - 145 mEq/L Final   Potassium 08/14/2023 4.8  3.5 - 5.1 mEq/L Final   Chloride 08/14/2023 105  96 - 112 mEq/L Final   CO2 08/14/2023  24  19 - 32 mEq/L Final   Glucose, Bld 08/14/2023 119 (H)  70 - 99 mg/dL Final   BUN 94/92/7974 27 (H)  6 - 23 mg/dL Final   Creatinine, Ser 08/14/2023 1.11  0.40 - 1.50 mg/dL Final   Total Bilirubin 08/14/2023 0.2  0.2 - 1.2 mg/dL Final   Alkaline Phosphatase 08/14/2023 74  39 - 117 U/L Final   AST 08/14/2023 28  0 - 37 U/L Final   ALT 08/14/2023 38  0 - 53 U/L Final   Total Protein 08/14/2023 7.0  6.0 - 8.3 g/dL Final   Albumin 94/92/7974 3.7   3.5 - 5.2 g/dL Final   GFR 94/92/7974 62.43  >60.00 mL/min Final   Calcium  08/14/2023 8.7  8.4 - 10.5 mg/dL Final   Pro B Natriuretic peptide (BNP) 08/14/2023 24.0  0.0 - 100.0 pg/mL Final   CRP 08/14/2023 4.0  0.5 - 20.0 mg/dL Final   Sed Rate 94/92/7974 44 (H)  0 - 20 mm/hr Final   Vitamin B-12 08/14/2023 634  211 - 911 pg/mL Final   Folate 08/14/2023 13.1  >5.9 ng/mL Final   SARS Coronavirus 2 Ag 08/14/2023 Negative  Negative Final  Office Visit on 07/31/2023  Component Date Value Ref Range Status   WBC 07/31/2023 7.0  4.0 - 10.5 K/uL Final   RBC 07/31/2023 4.75  4.22 - 5.81 Mil/uL Final   Hemoglobin 07/31/2023 14.5  13.0 - 17.0 g/dL Final   HCT 95/76/7974 43.5  39.0 - 52.0 % Final   MCV 07/31/2023 91.7  78.0 - 100.0 fl Final   MCHC 07/31/2023 33.3  30.0 - 36.0 g/dL Final   RDW 95/76/7974 14.0  11.5 - 15.5 % Final   Platelets 07/31/2023 167.0  150.0 - 400.0 K/uL Final   Neutrophils Relative % 07/31/2023 64.7  43.0 - 77.0 % Final   Lymphocytes Relative 07/31/2023 24.9  12.0 - 46.0 % Final   Monocytes Relative 07/31/2023 7.5  3.0 - 12.0 % Final   Eosinophils Relative 07/31/2023 2.3  0.0 - 5.0 % Final   Basophils Relative 07/31/2023 0.6  0.0 - 3.0 % Final   Neutro Abs 07/31/2023 4.5  1.4 - 7.7 K/uL Final   Lymphs Abs 07/31/2023 1.7  0.7 - 4.0 K/uL Final   Monocytes Absolute 07/31/2023 0.5  0.1 - 1.0 K/uL Final   Eosinophils Absolute 07/31/2023 0.2  0.0 - 0.7 K/uL Final   Basophils Absolute 07/31/2023 0.0  0.0 - 0.1 K/uL Final   Sodium 07/31/2023 138  135 - 145 mEq/L Final   Potassium 07/31/2023 4.2  3.5 - 5.1 mEq/L Final   Chloride 07/31/2023 104  96 - 112 mEq/L Final   CO2 07/31/2023 28  19 - 32 mEq/L Final   Glucose, Bld 07/31/2023 101 (H)  70 - 99 mg/dL Final   BUN 95/76/7974 27 (H)  6 - 23 mg/dL Final   Creatinine, Ser 07/31/2023 1.18  0.40 - 1.50 mg/dL Final   Total Bilirubin 07/31/2023 0.6  0.2 - 1.2 mg/dL Final   Alkaline Phosphatase 07/31/2023 59  39 - 117 U/L Final    AST 07/31/2023 16  0 - 37 U/L Final   ALT 07/31/2023 14  0 - 53 U/L Final   Total Protein 07/31/2023 6.7  6.0 - 8.3 g/dL Final   Albumin 95/76/7974 4.4  3.5 - 5.2 g/dL Final   GFR 95/76/7974 58.03 (L)  >60.00 mL/min Final   Calcium  07/31/2023 8.8  8.4 - 10.5 mg/dL Final  Phosphorus 07/31/2023 2.6  2.3 - 4.6 mg/dL Final   VITD 95/76/7974 47.95  30.00 - 100.00 ng/mL Final   PTH 07/31/2023 49  16 - 77 pg/mL Final   Uric Acid, Serum 07/31/2023 5.8  4.0 - 7.8 mg/dL Final   Color, Urine 95/76/7974 YELLOW  YELLOW Final   APPearance 07/31/2023 CLEAR  CLEAR Final   Specific Gravity, Urine 07/31/2023 1.019  1.001 - 1.035 Final   pH 07/31/2023 6.0  5.0 - 8.0 Final   Glucose, UA 07/31/2023 NEGATIVE  NEGATIVE Final   Bilirubin Urine 07/31/2023 NEGATIVE  NEGATIVE Final   Ketones, ur 07/31/2023 NEGATIVE  NEGATIVE Final   Hgb urine dipstick 07/31/2023 NEGATIVE  NEGATIVE Final   Protein, ur 07/31/2023 NEGATIVE  NEGATIVE Final   Nitrites, Initial 07/31/2023 NEGATIVE  NEGATIVE Final   Leukocyte Esterase 07/31/2023 NEGATIVE  NEGATIVE Final   WBC, UA 07/31/2023 NONE SEEN  0 - 5 /HPF Final   RBC / HPF 07/31/2023 NONE SEEN  0 - 2 /HPF Final   Squamous Epithelial / HPF 07/31/2023 NONE SEEN  < OR = 5 /HPF Final   Bacteria, UA 07/31/2023 NONE SEEN  NONE SEEN /HPF Final   Hyaline Cast 07/31/2023 NONE SEEN  NONE SEEN /LPF Final   Note 07/31/2023    Final   Vitamin B-12 07/31/2023 483  211 - 911 pg/mL Final   Methylmalonic Acid, Quant 07/31/2023 106  85 - 423 nmol/L Final   Ceruloplasmin 07/31/2023 20  14 - 30 mg/dL Final   Zinc  07/31/2023 60  60 - 130 mcg/dL Final   Total Protein 95/76/7974 6.4  6.1 - 8.1 g/dL Final   Albumin ELP 95/76/7974 4.1  3.8 - 4.8 g/dL Final   Alpha 1 95/76/7974 0.2  0.2 - 0.3 g/dL Final   Alpha 2 95/76/7974 0.7  0.5 - 0.9 g/dL Final   Beta Globulin 95/76/7974 0.4  0.4 - 0.6 g/dL Final   Beta 2 95/76/7974 0.3  0.2 - 0.5 g/dL Final   Gamma Globulin 07/31/2023 0.7 (L)  0.8 - 1.7 g/dL  Final   Abnormal Protein Band1 07/31/2023   NONE DETECTED g/dL Final   SPE Interp. 95/76/7974    Final   Protein, Ur 08/02/2023 9.3  Not Estab. mg/dL Final   Protein, 75Y Urine 08/02/2023 Comment  30 - 150 mg/24 hr Final   ALBUMIN, U 08/02/2023 100.0  % Final   ALPHA 1 URINE 08/02/2023 0.0  % Final   ALPHA-2-GLOBULIN, U 08/02/2023 0.0  % Final   % BETA, Urine 08/02/2023 0.0  % Final   GAMMA GLOBULIN URINE 08/02/2023 0.0  % Final   M-SPIKE, % 08/02/2023 Not Observed  Not Observed % Final   M-Spike, mg/24 hr 08/02/2023 Comment  Not Observed mg/24 hr Final   Immunofixation, Urine 08/02/2023 Comment   Final   NOTE: 08/02/2023 Comment   Final   Free Kappa Lt Chains,Ur 08/02/2023 45.86  1.17 - 86.46 mg/L Final   Free Lambda Lt Chains,Ur 08/02/2023 8.15  0.27 - 15.21 mg/L Final   Kappa/Lambda Ratio,U 08/02/2023 5.63  1.83 - 14.26 Final   ANCA SCREEN 07/31/2023 Negative  Negative Final   Sed Rate 07/31/2023 4  0 - 20 mm/hr Final   CRP 07/31/2023 <1.0  0.5 - 20.0 mg/dL Final   Reflexve Urine Culture 07/31/2023    Final  Office Visit on 03/27/2023  Component Date Value Ref Range Status   Vitamin B-12 03/27/2023 1,492 (H)  211 - 911 pg/mL Final   Folate  03/27/2023 16.0  >5.9 ng/mL Final   Hgb A1c MFr Bld 03/27/2023 5.9  4.6 - 6.5 % Final   Ferritin 03/27/2023 53.8  22.0 - 322.0 ng/mL Final   Sodium 03/27/2023 142  135 - 145 mEq/L Final   Potassium 03/27/2023 4.3  3.5 - 5.1 mEq/L Final   Chloride 03/27/2023 105  96 - 112 mEq/L Final   CO2 03/27/2023 30  19 - 32 mEq/L Final   Glucose, Bld 03/27/2023 109 (H)  70 - 99 mg/dL Final   BUN 87/81/7975 21  6 - 23 mg/dL Final   Creatinine, Ser 03/27/2023 1.03  0.40 - 1.50 mg/dL Final   Total Bilirubin 03/27/2023 0.5  0.2 - 1.2 mg/dL Final   Alkaline Phosphatase 03/27/2023 68  39 - 117 U/L Final   AST 03/27/2023 17  0 - 37 U/L Final   ALT 03/27/2023 14  0 - 53 U/L Final   Total Protein 03/27/2023 6.8  6.0 - 8.3 g/dL Final   Albumin 87/81/7975 4.5   3.5 - 5.2 g/dL Final   GFR 87/81/7975 68.48  >60.00 mL/min Final   Calcium  03/27/2023 8.9  8.4 - 10.5 mg/dL Final   WBC 87/81/7975 6.3  4.0 - 10.5 K/uL Final   RBC 03/27/2023 4.74  4.22 - 5.81 Mil/uL Final   Hemoglobin 03/27/2023 14.6  13.0 - 17.0 g/dL Final   HCT 87/81/7975 43.8  39.0 - 52.0 % Final   MCV 03/27/2023 92.3  78.0 - 100.0 fl Final   MCHC 03/27/2023 33.3  30.0 - 36.0 g/dL Final   RDW 87/81/7975 14.0  11.5 - 15.5 % Final   Platelets 03/27/2023 185.0  150.0 - 400.0 K/uL Final   Neutrophils Relative % 03/27/2023 60.8  43.0 - 77.0 % Final   Lymphocytes Relative 03/27/2023 27.2  12.0 - 46.0 % Final   Monocytes Relative 03/27/2023 9.0  3.0 - 12.0 % Final   Eosinophils Relative 03/27/2023 2.3  0.0 - 5.0 % Final   Basophils Relative 03/27/2023 0.7  0.0 - 3.0 % Final   Neutro Abs 03/27/2023 3.8  1.4 - 7.7 K/uL Final   Lymphs Abs 03/27/2023 1.7  0.7 - 4.0 K/uL Final   Monocytes Absolute 03/27/2023 0.6  0.1 - 1.0 K/uL Final   Eosinophils Absolute 03/27/2023 0.1  0.0 - 0.7 K/uL Final   Basophils Absolute 03/27/2023 0.0  0.0 - 0.1 K/uL Final   Cholesterol 03/27/2023 123  0 - 200 mg/dL Final   Triglycerides 87/81/7975 81.0  0.0 - 149.0 mg/dL Final   HDL 87/81/7975 41.00  >39.00 mg/dL Final   VLDL 87/81/7975 16.2  0.0 - 40.0 mg/dL Final   LDL Cholesterol 03/27/2023 65  0 - 99 mg/dL Final   Total CHOL/HDL Ratio 03/27/2023 3   Final   NonHDL 03/27/2023 81.51   Final   VITD 03/27/2023 34.36  30.00 - 100.00 ng/mL Final   Anti Nuclear Antibody (ANA) 03/27/2023 Negative  Negative Final   Rheumatoid fact SerPl-aCnc 03/27/2023 <10  <14 IU/mL Final   T3 Uptake 03/27/2023 28  22 - 35 % Final   T4, Total 03/27/2023 7.5  4.9 - 10.5 mcg/dL Final   Free Thyroxine Index 03/27/2023 2.1  1.4 - 3.8 Final   TSH 03/27/2023 1.05  0.40 - 4.50 mIU/L Final   Arsenic, 24H Ur 04/04/2023 <10  <=80 mcg/L Final   Lead, 24 hr urine 04/04/2023 <10  <80 mcg/L Final   Mercury, 24H Ur 04/04/2023 <4  <=20 mcg/L  Final  Office Visit  on 09/24/2022  Component Date Value Ref Range Status   Rheumatoid fact SerPl-aCnc 09/24/2022 <10  <14 IU/mL Final   Sed Rate 09/24/2022 2  0 - 20 mm/h Final   Scleroderma (Scl-70) (ENA) Antibod* 09/24/2022 <1.0 NEG  <1.0 NEG AI Final   Ribonucleic Protein(ENA) Antibody,* 09/24/2022 <1.0 NEG  <1.0 NEG AI Final   ENA SM Ab Ser-aCnc 09/24/2022 <1.0 NEG  <1.0 NEG AI Final   SSA (Ro) (ENA) Antibody, IgG 09/24/2022 <1.0 NEG  <1.0 NEG AI Final   ds DNA Ab 09/24/2022 1  IU/mL Final   C3 Complement 09/24/2022 124  82 - 185 mg/dL Final   C4 Complement 93/82/7975 21  15 - 53 mg/dL Final  Office Visit on 09/05/2022  Component Date Value Ref Range Status   WBC 09/05/2022 6.9  4.0 - 10.5 K/uL Final   RBC 09/05/2022 4.71  4.22 - 5.81 Mil/uL Final   Hemoglobin 09/05/2022 14.1  13.0 - 17.0 g/dL Final   HCT 94/70/7975 42.4  39.0 - 52.0 % Final   MCV 09/05/2022 90.2  78.0 - 100.0 fl Final   MCHC 09/05/2022 33.2  30.0 - 36.0 g/dL Final   RDW 94/70/7975 14.9  11.5 - 15.5 % Final   Platelets 09/05/2022 158.0  150.0 - 400.0 K/uL Final   Neutrophils Relative % 09/05/2022 62.2  43.0 - 77.0 % Final   Lymphocytes Relative 09/05/2022 25.1  12.0 - 46.0 % Final   Monocytes Relative 09/05/2022 9.9  3.0 - 12.0 % Final   Eosinophils Relative 09/05/2022 2.2  0.0 - 5.0 % Final   Basophils Relative 09/05/2022 0.6  0.0 - 3.0 % Final   Neutro Abs 09/05/2022 4.3  1.4 - 7.7 K/uL Final   Lymphs Abs 09/05/2022 1.7  0.7 - 4.0 K/uL Final   Monocytes Absolute 09/05/2022 0.7  0.1 - 1.0 K/uL Final   Eosinophils Absolute 09/05/2022 0.2  0.0 - 0.7 K/uL Final   Basophils Absolute 09/05/2022 0.0  0.0 - 0.1 K/uL Final   Ferritin 09/05/2022 29.4  22.0 - 322.0 ng/mL Final   Cholesterol 09/05/2022 123  0 - 200 mg/dL Final   Triglycerides 94/70/7975 160.0 (H)  0.0 - 149.0 mg/dL Final   HDL 94/70/7975 34.60 (L)  >60.99 mg/dL Final   VLDL 94/70/7975 32.0  0.0 - 40.0 mg/dL Final   LDL Cholesterol 09/05/2022 57  0 - 99  mg/dL Final   Total CHOL/HDL Ratio 09/05/2022 4   Final   NonHDL 09/05/2022 88.83   Final   Sodium 09/05/2022 142  135 - 145 mEq/L Final   Potassium 09/05/2022 4.4  3.5 - 5.1 mEq/L Final   Chloride 09/05/2022 107  96 - 112 mEq/L Final   CO2 09/05/2022 28  19 - 32 mEq/L Final   Glucose, Bld 09/05/2022 101 (H)  70 - 99 mg/dL Final   BUN 94/70/7975 19  6 - 23 mg/dL Final   Creatinine, Ser 09/05/2022 1.17  0.40 - 1.50 mg/dL Final   Total Bilirubin 09/05/2022 0.4  0.2 - 1.2 mg/dL Final   Alkaline Phosphatase 09/05/2022 73  39 - 117 U/L Final   AST 09/05/2022 18  0 - 37 U/L Final   ALT 09/05/2022 15  0 - 53 U/L Final   Total Protein 09/05/2022 6.5  6.0 - 8.3 g/dL Final   Albumin 94/70/7975 4.0  3.5 - 5.2 g/dL Final   GFR 94/70/7975 59.00 (L)  >60.00 mL/min Final   Calcium  09/05/2022 8.6  8.4 - 10.5 mg/dL Final  VITD 09/05/2022 46.57  30.00 - 100.00 ng/mL Final   Magnesium 09/05/2022 1.9  1.5 - 2.5 mg/dL Final   Phosphorus 94/70/7975 2.1 (L)  2.3 - 4.6 mg/dL Final   Uric Acid, Serum 09/05/2022 5.7  4.0 - 7.8 mg/dL Final  Scanned Document on 06/20/2022  Component Date Value Ref Range Status   HM Colonoscopy 06/20/2022 See Report (in chart)  See Report (in chart), Patient Reported Final  Appointment on 06/04/2022  Component Date Value Ref Range Status   Weight 06/04/2022 2,848  oz Final   Height 06/04/2022 70  in Final   BP 06/04/2022 122/70  mmHg Final   S' Lateral 06/04/2022 2.15  cm Final   Area-P 1/2 06/04/2022 3.77  cm2 Final   MV M vel 06/04/2022 3.23  m/s Final   MV Peak grad 06/04/2022 41.6  mmHg Final   Est EF 06/04/2022 55 - 60%   Final  Hospital Outpatient Visit on 05/20/2022  Component Date Value Ref Range Status   Creatinine, Ser 05/20/2022 1.30 (H)  0.61 - 1.24 mg/dL Final  There may be more visits with results that are not included.  No image results found. DG Abd 2 Views Result Date: 10/12/2023 CLINICAL DATA:  constipation and abd discomfort. EXAM: ABDOMEN - 2 VIEW  COMPARISON:  May 20, 2022 FINDINGS: Nonobstructive bowel gas pattern. Moderate volume fecal loading in the ascending colon. No pneumoperitoneum. No organomegaly or radiopaque calculi. Cholecystectomy clips. No acute fracture or destructive lesion. The lung bases are clear.Multilevel degenerative disc disease of the spine. Diffuse aortic atherosclerosis. IMPRESSION: Nonobstructive bowel gas pattern. Moderate volume fecal loading in the ascending colon, as can be seen in constipation. Electronically Signed   By: Rogelia Myers M.D.   On: 10/12/2023 12:18   DG Chest 2 View Result Date: 10/08/2023 CLINICAL DATA:  History of pneumonia, worsening fatigue EXAM: CHEST - 2 VIEW COMPARISON:  Chest x-ray Aug 14, 2023 FINDINGS: The cardiomediastinal silhouette is unchanged in contour. Persistent left lower lobe opacity, decreased in conspicuity compared to Aug 14, 2023. No new focal pulmonary opacity. No pleural effusion or pneumothorax. The visualized upper abdomen is unremarkable. No acute osseous abnormality. IMPRESSION: Persistent but decreased left lower lobe opacity. Differential considerations include atelectasis versus resolving pneumonia. Electronically Signed   By: Dirk Arrant M.D.   On: 10/08/2023 13:25         ASSESSMENT & PLAN   Assessment & Plan Small fiber neuropathy Peripheral neuropathy   Peripheral neuropathy is managed with pregabalin  (Lyrica ) 200 mg twice daily. He reports improvement in symptoms with the current regimen and does not wish to increase the dosage. He should continue pregabalin  (Lyrica ) 200 mg twice daily.  Knee osteoarthritis, bilateral   Bilateral knee osteoarthritis is managed with topical diclofenac  (Voltaren ). He reports significant improvement in symptoms with the current use of Voltaren  on multiple joints. He should continue diclofenac  (Voltaren ) 2 g topically in the morning and at bedtime. Drowsiness OSA on CPAP Primary insomnia Overweight Obstructive sleep  apnea with CPAP noncompliance and persistent drowsiness and memory impairment   He experiences obstructive sleep apnea with CPAP noncompliance due to mask leak issues, likely worsened by nasal congestion and beard interference. Persistent drowsiness and memory impairment may result from ineffective CPAP therapy. The impact of nasal congestion and mask fit on CPAP efficacy was discussed, along with surgical options to improve nasal airflow and CPAP effectiveness, such as nasal tissue reduction and somnoplasty. The Inspire procedure was considered as an alternative to CPAP. Addressing  these issues is crucial for improving overall health and cognitive function. He will be referred to ENT for evaluation of nasal airflow and potential surgical interventions. The Inspire procedure will be discussed with ENT as an alternative to CPAP. He should continue nasal rinses every morning and nasal spray at night, and attempt to improve CPAP mask fit by considering different mask types. Weight gain   He has gained 5-6 pounds since the last visit, attributed to increased food intake and decreased physical activity. The potential use of Zepbound  for weight management was discussed, which may also aid in sleep apnea management and improve memory. Zepbound  could offer additional health benefits, including kidney protection and cholesterol improvement. Efforts will be made to obtain insurance coverage for Zepbound  to aid in weight management and sleep apnea treatment. Nasal congestion Chronic nasal congestion contributing to CPAP intolerance   Chronic nasal congestion, likely exacerbated by environmental factors like concrete dust exposure, contributes to CPAP intolerance. Current treatment includes nasal rinses and nasal spray. Addressing nasal congestion is important for improving CPAP efficacy and overall health. He should continue nasal rinses every morning and nasal spray at night, and will be referred to ENT for evaluation  and potential surgical intervention to improve nasal airflow.   The following is a medical necessity letter for Zepbound  (tirzepatide ) for a patient with multiple comorbidities, including obstructive sleep apnea (OSA) on CPAP, mixed hyperlipidemia, osteoarthritis, fatty liver, and other weight-related conditions. The evidence supports Zepbound  for long-term weight reduction in adults with obesity or overweight with at least one weight-related comorbidity, and for the treatment of moderate to severe OSA in adults with obesity, as per FDA-approved indications. The letter will connect the patient's clinical context to these indications and address safety and dosing considerations, referencing the FDA label and recent clinical trial data.[1-5] Zepbound  Medical Necessity This letter is to document the medical necessity for Zepbound  (tirzepatide ) for the above-named patient, who presents with a complex medical history including: essential tremor, idiopathic peripheral neuropathy, obstructive sleep apnea (OSA) on continuous positive airway pressure (CPAP), gastroesophageal reflux disease, mixed hyperlipidemia, osteoarthritis (multiple sites), history of melanoma, insomnia, vitamin B12 deficiency, restless legs syndrome, chronic fatigue, bilateral knee arthritis, right shoulder joint pain, chest discomfort, former tobacco use, high-risk medication use, acquired kidney cyst, fatty liver, thromboangiitis obliterans (Buerger's disease), right upper quadrant abdominal pain, Raynaud's phenomenon, diverticular disease, right inguinal hernia, aortic atherosclerosis, and bronchiectasis. FDA-Approved Indications and Patient Eligibility Zepbound  (tirzepatide ) is FDA-approved for:  Long-term weight reduction and maintenance in adults with obesity (BMI >=30 kg/m) or overweight (BMI >=27 kg/m) with at least one weight-related comorbid condition  Treatment of moderate to severe OSA in adults with obesity Zepbound  is indicated  as an adjunct to a reduced-calorie diet and increased physical activity.[1-2] This patient meets the criteria for Zepbound  based on the presence of OSA on CPAP, mixed hyperlipidemia, osteoarthritis, fatty liver, and aortic atherosclerosis, all of which are recognized weight-related comorbidities. The patient's persistent symptoms and comorbidities despite standard therapy further support the need for adjunctive pharmacotherapy. Rationale for Zepbound  Obesity is a chronic, relapsing neuroendocrine disease that increases the risk of cardiometabolic, hepatic, and musculoskeletal complications.[3] In patients with OSA, obesity is a major modifiable risk factor, and weight reduction is a cornerstone of management. Zepbound  has demonstrated significant and sustained weight loss (mean >20% total body weight reduction at 72 weeks with the 15 mg dose), improvement in metabolic parameters, and reduction in OSA severity in clinical trials.[2-3][5] The American Diabetes Association and the European Association for  the Study of Diabetes recognize tirzepatide  as superior to other GLP-1 receptor agonists for weight and metabolic outcomes.[4] Clinical Benefits for This Patient  OSA: Weight reduction with Zepbound  is expected to improve OSA severity and may reduce CPAP requirements.[2]  Mixed Hyperlipidemia and Atherosclerosis: Tirzepatide  improves lipid profiles and reduces visceral adiposity, addressing cardiovascular risk.[3-4]  Osteoarthritis and Chronic Pain: Weight loss is associated with reduced joint pain and improved function in osteoarthritis.  Fatty Liver: Tirzepatide  reduces liver fat content and improves hepatic biomarkers.[3-4]  Other Comorbidities: Sustained weight loss may improve GERD, chronic fatigue, and overall quality of life.[3] Dosing and Administration Zepbound  is administered subcutaneously once weekly, starting at 2.5 mg for 4 weeks, then titrated in 2.5 mg increments to a maintenance dose of 5,  10, or 15 mg weekly for weight reduction, or 10 or 15 mg weekly for OSA. The maximum recommended dose is 15 mg weekly.[2] Safety and Monitoring Zepbound  is contraindicated in patients with a personal or family history of medullary thyroid  carcinoma or MEN 2, or prior serious hypersensitivity to tirzepatide .[2] The most common adverse events are gastrointestinal (nausea, diarrhea, vomiting, constipation), which are generally mild to moderate and decrease over time.[2][5] Renal and hepatic impairment do not require dose adjustment, but monitoring is recommended in the setting of adverse reactions.[2] The patient's history of melanoma does not represent a contraindication, but ongoing surveillance is appropriate. Summary Given the patient's multiple weight-related comorbidities, including OSA, mixed hyperlipidemia, osteoarthritis, and fatty liver, and the failure of lifestyle modification and standard therapies to achieve adequate disease control, Zepbound  is medically necessary to achieve and maintain clinically meaningful weight reduction and to improve overall health outcomes. This request is consistent with FDA-approved indications and supported by robust clinical trial data.[1-5] Please approve coverage for Zepbound  (tirzepatide ) for this patient as an essential component of their comprehensive obesity and comorbidity management plan.  References This medical necessity letter is grounded in the most current FDA labeling and clinical trial evidence for Zepbound , directly linking the patient's comorbidities to the approved indications and expected clinical benefits. If further documentation or clarification is needed, additional details can be provided. Would you like me to review the latest evidence and guidelines regarding the safety and efficacy of tirzepatide  in patients with a history of malignancy, particularly melanoma and hepatocellular carcinoma, to ensure there are no contraindications or special  considerations for this patient? 1. FDA Orange Book. FDA Orange Book 2. Zepbound . FDA Drug Label. Food and Drug Administration Updated date: 2023-10-14 3. Tirzepatide  for Obesity Treatment and Diabetes Prevention. Jastreboff AM, le Roux CW, [place] A, et al. The [place] Journal of Medicine. 2025;392(10):958-971. doi:10.1056/NEJMoa2410819.  Leading Journal   New Research 4. Management of Hyperglycemia in Type 2 Diabetes, 2022. A Consensus Report by the American Diabetes Association (ADA) and the European Association for the Study of Diabetes (EASD). Storm MJ, Lyndee VR, Collins BS, et al. Diabetes Care. 2022;45(11):2753-2786. doi:10.2337/dci22-0034.  Leading Journal  5. Continued Treatment With Tirzepatide  for Maintenance of Weight Reduction in Adults With Obesity: The SURMOUNT-4 Randomized Clinical Trial. Franklyn ASP, Sattar N, Horn DB, et al. JAMA. 2024;331(1):38-48. doi:10.1001/jama.7976.75054.  Leading Journal   Finished thinking BMI alone may underestimate cardiometabolic risk in patients with significant visceral adiposity, as abdominal fat is strongly linked to increased risk of cardiovascular disease and other obesity-related comorbidities, even when BMI is below the obesity threshold.[1] Clinical guidelines from the American Heart Association and recent reviews in JAMA recommend assessing waist circumference and visceral adiposity alongside BMI to better identify patients  at elevated risk who may benefit from weight loss interventions.[1-2] For pharmacologic therapy, Zepbound  (tirzepatide ) is FDA-approved for adults with overweight (BMI >=27) and at least one weight-related comorbidity, such as dyslipidemia, hypertension, or obstructive sleep apnea.[3-5] Clinical trials of tirzepatide  included patients with BMI 27-30 and comorbidities, demonstrating significant reductions in body weight and waist circumference, and improvement in metabolic parameters.[4][6] Although the  patient's BMI of 25.7 does not meet the FDA threshold for overweight, the presence of significant visceral adiposity concentrated in the abdomen increases the risk for obesity-related comorbidities and may warrant more aggressive risk factor modification. However, current FDA labeling and clinical trial inclusion criteria for Zepbound  require a BMI >=27 with at least one weight-related comorbidity for weight loss indications.[3-5] There is no explicit guidance in the medical literature on using visceral adiposity or waist circumference alone to qualify for tirzepatide  therapy, so further assessment of waist circumference or imaging-based quantification may be needed to support clinical decision-making.[1-2] In summary, significant visceral adiposity increases risk for obesity-related comorbidities even at BMI <30, but current FDA criteria for Zepbound  require BMI >=27 plus a comorbidity; clinical guidelines recommend incorporating waist circumference and visceral adiposity into risk assessment and management decisions.[1-6] Given the importance of visceral adiposity in risk stratification beyond BMI, would you like me to help you review the latest evidence on the utility and thresholds of waist circumference and imaging modalities (like CT or MRI) for quantifying visceral fat to better guide risk assessment and potential off-label consideration of tirzepatide ? 1. Obesity and Cardiovascular Disease: A Scientific Statement From the American Heart Association. Powell-Wiley TM, Shermon SHAUNNA Dann LADORA, et al. Circulation. 2021;143(21):e984-e1010. doi:10.1161/CIR.0000000000000973.  Leading Journal   2. Obesity Management in Adults: A Review. Elmaleh-Sachs A, Arlana RUSH, Bramante CT, et al. JAMA. 2023;330(20):2000-2015. doi:10.1001/jama.7976.80102.  Leading Journal   3. FDA Orange Book. FDA Orange Book 4. Zepbound . FDA Drug Label. Food and Drug Administration Updated date: 2023-10-14 5. Clinical  Management of Obesity - Third Edition. Aleck HERO. Apovian MD, Morton Skeens MD, Lauraine FABIENE Parks MD The Obesity Society [phone]) Practice Guideline 6. Efficacy and Safety of Tirzepatide  on Weight Loss in Patients Without Diabetes Mellitus: A Systematic Review and Meta-Analysis of Randomized Controlled Trials. Kommu S, Sharma PP, Gabor RM. Obesity Reviews : An Official Journal of the International Association for the Study of Obesity. 2025;:e13961. doi:10.1111/obr.13961.  Leading Journal      ORDER ASSOCIATIONS  #   DIAGNOSIS / CONDITION ICD-10 ENCOUNTER ORDER     ICD-10-CM   1. Drowsiness  R40.0 Ambulatory referral to ENT    CANCELED: Ambulatory referral to ENT    CANCELED: Ambulatory referral to ENT    2. OSA on CPAP  G47.33 tirzepatide  (ZEPBOUND ) 2.5 MG/0.5ML Pen    Ambulatory referral to ENT    CANCELED: Ambulatory referral to ENT    CANCELED: Ambulatory referral to ENT    3. Small fiber neuropathy  G62.9 Ambulatory referral to ENT    CANCELED: Ambulatory referral to ENT    CANCELED: Ambulatory referral to ENT    4. Primary insomnia  F51.01 Ambulatory referral to ENT    CANCELED: Ambulatory referral to ENT    CANCELED: Ambulatory referral to ENT    5. Overweight  E66.3 tirzepatide  (ZEPBOUND ) 2.5 MG/0.5ML Pen    6. Nasal congestion  R09.81            Orders Placed in Encounter:   Lab Orders  No laboratory test(s) ordered today   Imaging Orders  No imaging studies ordered today  Referral Orders         Ambulatory referral to ENT     Meds ordered this encounter  Medications   tirzepatide  (ZEPBOUND ) 2.5 MG/0.5ML Pen    Sig: Inject 2.5 mg into the skin once a week.    Dispense:  2 mL    Refill:  11    Orders Placed This Encounter  Procedures   Ambulatory referral to ENT    Referral Priority:   Routine    Referral Type:   Consultation    Referral Reason:   Specialty Services Required    Referred to Provider:   Karis Clunes, MD    Requested Specialty:    Otolaryngology    Number of Visits Requested:   1   ED Discharge Orders          Ordered    Ambulatory referral to ENT  Status:  Canceled       Comments: ENT Referral for CPAP User  Patient failing conservative therapies for obstructive sleep apnea.  Patient with obstructive sleep apnea on CPAP presents with chronic sinus congestion and reports increased nightly water use from the CPAP humidifier, suggestive of persistent nasal obstruction and possible mask/mouth leak. Despite optimal medical management for rhinosinusitis and allergic rhinitis, symptoms remain refractory, and the patient continues to experience significant nasal congestion, impaired sleep quality, and suboptimal PAP adherence.  Given the chronicity of symptoms and impact on PAP tolerance, referral is requested for comprehensive ENT evaluation. 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 Surgical intervention (e.g., endoscopic sinus surgery) may be indicated in patients with medically refractory disease to improve sinonasal drainage and quality of life.[1][2][3][4]  - Inferior turbinate hypertrophy: Evaluate for possible turbinate reduction (turbinectomy, submucous resection, or tissue ablation). Both submucous resection and coblation turbinoplasty are supported by the literature as effective for relieving nasal obstruction, with coblation associated with improved postoperative healing and less pain/crusting.[5][6][7][8][9]  - Somnoplasty: Consider as a minimally invasive option for turbinate reduction or soft tissue ablation in select cases of nasal obstruction or snoring.[8][9]  - Inspire therapy (hypoglossal nerve stimulation): Please assess candidacy for Inspire therapy as an alternative or adjunct for OSA management, particularly  if PAP intolerance persists despite optimization of nasal airflow.[10]  The American Academy of Sleep Medicine recommends surgical consultation for OSA patients with persistent PAP intolerance due to nasal obstruction, as upper airway surgery can improve PAP adherence and reduce required pressures. The cost-effectiveness of turbinate reduction in improving CPAP compliance has also been demonstrated.[11][12][13][14][15][16][17][18]  Please evaluate for the above interventions and provide recommendations regarding surgical and device-based options to optimize airway patency and OSA management. References 1. Surgical Management of Rhinosinusitis for the Allergist-Immunologist. Almosnino G, Little RE. Annals of Allergy, Asthma & Immunology : Official Publication of the American College of Allergy, Asthma, & Immunology. 2023;131(3):311-316. doi:10.1016/j.anai.2023.05.015. 2. Clinical Practice Guideline: Surgical Management of Chronic Rhinosinusitis. Lupe RENETTE Tanda CHRISTELLA Domenica CHRISTELLA, et al. Otolaryngology--Head and Neck Surgery : Official Journal of American Academy of Otolaryngology-Head and Neck Surgery. 2025;172 Suppl 2:S1-S47. doi:10.1002/ohn.1287. 3. Clinical Practice Guideline: Adult Sinusitis Update. Emilio SECTION, Domenica CHRISTELLA Pearline JINNY, et al. Otolaryngology--Head and Neck Surgery : Official Journal of American Academy of Otolaryngology-Head and Neck Surgery. 7974;826 Suppl 1:S1-S56. doi:10.1002/ohn.1344. 4. Chronic Rhinosinusitis. Elpidio SEVERIN, Orlando JOLYNN Orlando DOROTHA American Family Physician. 2023;108(4):370-377. 5. Medial Flap Coblation Turbinoplasty Versus Submucous Resection:  Outcomes. El-Sisi HE, Etman M, Ebada HA. American Journal of Rhinology & Allergy. 2023;37(6):670-678. doi:10.1177/19458924231185727. 6. Managing Turbinate Hypertrophy: Coblation vs. Radiofrequency Treatment. Passali D, Loglisci M, Politi L, Passali GC, Kern E. European Archives of Oto-Rhino-Laryngology : Retail banker of the [employer] of [employer] ([employer]) : Affiliated With the [employer] for [employer] and [employer]. 2016;273(6):1449-53. doi:10.1007/s00405-670-167-6913-6. 7. Clinical Practice Guideline: Allergic Rhinitis. Michail MD, Camelia RACK, Melia ALDRICH, et al. Otolaryngology--Head and Neck Surgery : Official Journal of American Academy of Otolaryngology-Head and Neck Surgery. 7984;847(8 Suppl):S1-43. doi:10.1177/0194599814561600. 8. Systematic Review of Surgical Interventions for Inferior Turbinate Hypertrophy. Zhang K, Pipaliya RM, Miglani A, Nguyen SA, Chunky. American Journal of Rhinology & Allergy. 2023;37(1):110-122. doi:10.1177/19458924221134555. 9. Emerging Roles of Coblation in Rhinology and Skull Base Surgery. [place], Hwang PH. [facility] [place]. 2017;50(3):599-606. doi:10.1016/j.otc.2017.01.010. 10. Obstructive Sleep Apnea for the Rhinologist. Henrietta JOLYNN Darrick LOISE Gigi CT, Lin FY. Current Opinion in Otolaryngology & Head and Neck Surgery. 2024;32(1):35-39. doi:10.1097/MOO.0000000000000941. 11. Referral of Adults With Obstructive Sleep Apnea for Surgical Consultation: An American Academy of Sleep Medicine Clinical Practice Guideline. Bennet JONETTA Taft JINNY Deedra RN, et al. Journal of Clinical Sleep Medicine : JCSM : Official Publication of the American Academy of Sleep Medicine. 2021;17(12):2499-2505. doi:10.5664/jcsm.Z9623563. 12. The Clinical Influence of Nasal Surgery on PAP Compliance and Optimal Application Among OSA Subjects Uncomfortable With PAP Device Wear. Lovely VEAR Ora VEAR, Han SA, et al. [employer]. 2023;13(1):4383. doi:10.1038/s41598-023-31588-7. 13. Nasal Surgery for Obstructive Sleep Apnea Syndrome. Mickelson SA. [facility] [place]. 2016;49(6):1373-1381. doi:10.1016/j.otc.2016.07.002. 14. The Management of Chronic Insomnia Disorder and Obstructive Sleep Apnea (Insomnia/OSA) (2025). Liza Lee MD PhD, Beverley Ned MD, Arline Counter PhD DBSM, et al. Department of [facility]. 15. Tolerance of Continuous Positive Airway Pressure After Sinonasal Surgery. Sigurd FRAME, Boon MS, Vimawala S, et al. [employer]. 2021;131(3):E1013-E1018. doi:10.1002/lary.71031. 16. Treatment of the Nose for Patients With Sleep Apnea. Callander JK, Tina RUSH. [facility] [place]. 2024;57(3):491-500. doi:10.1016/j.otc.2023.11.002. 17. Association of Allergic Rhinitis With Change in Nasal Congestion in New Continuous Positive Airway Pressure Users. Rod MICKEY Agent KT, Clyda HILLARY Ferrier EM. JAMA Otolaryngology-- Head & Neck Surgery. 2020;146(6):523-529. doi:10.1001/jamaoto.2020.0261. 18. A Cost-Effectiveness Analysis of Nasal Surgery to Increase Continuous Positive Airway Pressure Adherence in Sleep Apnea Patients With Nasal Obstruction. Kempfle JS, [address] Dobrowski JM, [place] MB, [place]. The Laryngoscope. 2017;127(4):977-983. doi:10.1002/lary.73742.   01/02/24 1202    tirzepatide  (ZEPBOUND ) 2.5 MG/0.5ML Pen  Weekly        01/02/24 1210    Ambulatory referral to ENT  Status:  Canceled       Comments: ENT Referral for CPAP User  Patient failing conservative therapies for obstructive sleep apnea.  Patient with obstructive sleep apnea on CPAP presents with chronic sinus congestion and reports increased nightly water use from the CPAP humidifier, suggestive of persistent nasal obstruction and possible mask/mouth leak. Despite optimal medical management for rhinosinusitis and allergic rhinitis, symptoms remain refractory, and the patient continues to experience significant nasal congestion, impaired sleep quality, and suboptimal PAP adherence.  Given the chronicity of symptoms and impact on PAP tolerance, referral is requested for comprehensive ENT evaluation. Please assess for the following:  - Chronic rhinosinusitis: Consider endoscopic and/or imaging evaluation to confirm diagnosis and  assess for surgical candidacy, as per the American Academy of Otolaryngology-Head and Neck Surgery guidelines. Surgical intervention (e.g., endoscopic sinus surgery) may be indicated in patients with medically refractory disease to improve sinonasal drainage and quality of  life.[1][2][3][4]  - Inferior turbinate hypertrophy: Evaluate for possible turbinate reduction (turbinectomy, submucous resection, or tissue ablation). Both submucous resection and coblation turbinoplasty are supported by the literature as effective for relieving nasal obstruction, with coblation associated with improved postoperative healing and less pain/crusting.[5][6][7][8][9]  - Somnoplasty: Consider as a minimally invasive option for turbinate reduction or soft tissue ablation in select cases of nasal obstruction or snoring.[8][9]  - Inspire therapy (hypoglossal nerve stimulation): Please assess candidacy for Inspire therapy as an alternative or adjunct for OSA management, particularly if PAP intolerance persists despite optimization of nasal airflow.[10]  The American Academy of Sleep Medicine recommends surgical consultation for OSA patients with persistent PAP intolerance due to nasal obstruction, as upper airway surgery can improve PAP adherence and reduce required pressures. The cost-effectiveness of turbinate reduction in improving CPAP compliance has also been demonstrated.[11][12][13][14][15][16][17][18]  Please evaluate for the above interventions and provide recommendations regarding surgical and device-based options to optimize airway patency and OSA management. References 1. Surgical Management of Rhinosinusitis for the Allergist-Immunologist. Almosnino G, Little RE. Annals of Allergy, Asthma & Immunology : Official Publication of the American College of Allergy, Asthma, & Immunology. 2023;131(3):311-316. doi:10.1016/j.anai.2023.05.015. 2. Clinical Practice Guideline: Surgical Management of Chronic Rhinosinusitis.  Lupe RENETTE Tanda CHRISTELLA Domenica CHRISTELLA, et al. Otolaryngology--Head and Neck Surgery : Official Journal of American Academy of Otolaryngology-Head and Neck Surgery. 2025;172 Suppl 2:S1-S47. doi:10.1002/ohn.1287. 3. Clinical Practice Guideline: Adult Sinusitis Update. Emilio SECTION, Domenica CHRISTELLA Pearline JINNY, et al. Otolaryngology--Head and Neck Surgery : Official Journal of American Academy of Otolaryngology-Head and Neck Surgery. 7974;826 Suppl 1:S1-S56. doi:10.1002/ohn.1344. 4. Chronic Rhinosinusitis. Elpidio SEVERIN, Orlando JOLYNN Orlando DOROTHA American Family Physician. 2023;108(4):370-377. 5. Medial Flap Coblation Turbinoplasty Versus Submucous Resection: Outcomes. El-Sisi HE, Etman M, Ebada HA. American Journal of Rhinology & Allergy. 2023;37(6):670-678. doi:10.1177/19458924231185727. 6. Managing Turbinate Hypertrophy: Coblation vs. Radiofrequency Treatment. Passali D, Loglisci M, Politi L, Passali GC, Kern E. European Archives of Oto-Rhino-Laryngology : Retail banker of the [employer] (EUFOS) : Affiliated With the [employer] for [employer] and [employer]. 2016;273(6):1449-53. doi:10.1007/s00405-([phone])471-0906-6. 7. Clinical Practice Guideline: Allergic Rhinitis. Michail MD, Camelia RACK, Melia ALDRICH, et al. Otolaryngology--Head and Neck Surgery : Official Journal of American Academy of Otolaryngology-Head and Neck Surgery. 7984;847(8 Suppl):S1-43. doi:10.1177/0194599814561600. 8. Systematic Review of Surgical Interventions for Inferior Turbinate Hypertrophy. Zhang K, Pipaliya RM, Miglani A, Nguyen SA, [place]. American Journal of Rhinology & Allergy. 2023;37(1):110-122. doi:10.1177/19458924221134555. 9. Emerging Roles of Coblation in Rhinology and Skull Base Surgery. [place], Hwang PH. [facility] [place]. 2017;50(3):599-606. doi:10.1016/j.otc.2017.01.010. 10. Obstructive Sleep Apnea for the Rhinologist. Henrietta JOLYNN Darrick LOISE Gigi CT, Lin FY.  Current Opinion in Otolaryngology & Head and Neck Surgery. 2024;32(1):35-39. doi:10.1097/MOO.0000000000000941. 11. Referral of Adults With Obstructive Sleep Apnea for Surgical Consultation: An American Academy of Sleep Medicine Clinical Practice Guideline. Bennet JONETTA Taft JINNY Deedra RN, et al. Journal of Clinical Sleep Medicine : JCSM : Official Publication of the American Academy of Sleep Medicine. 2021;17(12):2499-2505. doi:10.5664/jcsm.Z9623563. 12. The Clinical Influence of Nasal Surgery on PAP Compliance and Optimal Application Among OSA Subjects Uncomfortable With PAP Device Wear. Lovely VEAR Ora VEAR, Han SA, et al. [employer]. 2023;13(1):4383. doi:10.1038/s41598-023-31588-7. 13. Nasal Surgery for Obstructive Sleep Apnea Syndrome. Mickelson SA. [facility] [place]. 2016;49(6):1373-1381. doi:10.1016/j.otc.2016.07.002. 14. The Management of Chronic Insomnia Disorder and Obstructive Sleep Apnea (Insomnia/OSA) (2025). Liza Lee MD PhD, Beverley Ned MD, Arline Counter PhD DBSM, et al. Department of [facility]. 15. Tolerance of Continuous Positive Airway Pressure After Sinonasal  Surgery. Sigurd FRAME, Boon MS, Vimawala S, et al. [employer]. 2021;131(3):E1013-E1018. doi:10.1002/lary.71031. 16. Treatment of the Nose for Patients With Sleep Apnea. Callander JK, Tina RUSH. [facility] [place]. 2024;57(3):491-500. doi:10.1016/j.otc.2023.11.002. 17. Association of Allergic Rhinitis With Change in Nasal Congestion in New Continuous Positive Airway Pressure Users. Rod MICKEY Agent KT, Clyda HILLARY Ferrier EM. JAMA Otolaryngology-- Head & Neck Surgery. 2020;146(6):523-529. doi:10.1001/jamaoto.2020.0261. 18. A Cost-Effectiveness Analysis of Nasal Surgery to Increase Continuous Positive Airway Pressure Adherence in Sleep Apnea Patients With Nasal Obstruction. Kempfle JS, [address] Dobrowski JM, [place] MB, [place]. The Laryngoscope. 2017;127(4):977-983.  doi:10.1002/lary.73742.   01/02/24 1212    Ambulatory referral to ENT       Comments: ENT Referral for CPAP User  Patient failing conservative therapies for obstructive sleep apnea.  Patient with obstructive sleep apnea on CPAP presents with chronic sinus congestion and reports increased nightly water use from the CPAP humidifier, suggestive of persistent nasal obstruction and possible mask/mouth leak. Despite optimal medical management for rhinosinusitis and allergic rhinitis, symptoms remain refractory, and the patient continues to experience significant nasal congestion, impaired sleep quality, and suboptimal PAP adherence.  Given the chronicity of symptoms and impact on PAP tolerance, referral is requested for comprehensive ENT evaluation. 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 Surgical intervention (e.g., endoscopic sinus surgery) may be indicated in patients with medically refractory disease to improve sinonasal drainage and quality of life.[1][2][3][4]  - Inferior turbinate hypertrophy: Evaluate for possible turbinate reduction (turbinectomy, submucous resection, or tissue ablation). Both submucous resection and coblation turbinoplasty are supported by the literature as effective for relieving nasal obstruction, with coblation associated with improved postoperative healing and less pain/crusting.[5][6][7][8][9]  - Somnoplasty: Consider as a minimally invasive option for turbinate reduction or soft tissue ablation in select cases of nasal obstruction or snoring.[8][9]  - Inspire therapy (hypoglossal nerve stimulation): Please assess candidacy for Inspire therapy as an alternative or adjunct for OSA management, particularly if PAP intolerance persists despite optimization of nasal  airflow.[10]  The American Academy of Sleep Medicine recommends surgical consultation for OSA patients with persistent PAP intolerance due to nasal obstruction, as upper airway surgery can improve PAP adherence and reduce required pressures. The cost-effectiveness of turbinate reduction in improving CPAP compliance has also been demonstrated.[11][12][13][14][15][16][17][18]  Please evaluate for the above interventions and provide recommendations regarding surgical and device-based options to optimize airway patency and OSA management. References 1. Surgical Management of Rhinosinusitis for the Allergist-Immunologist. Almosnino G, Little RE. Annals of Allergy, Asthma & Immunology : Official Publication of the American College of Allergy, Asthma, & Immunology. 2023;131(3):311-316. doi:10.1016/j.anai.2023.05.015. 2. Clinical Practice Guideline: Surgical Management of Chronic Rhinosinusitis. Lupe RENETTE Tanda CHRISTELLA Domenica CHRISTELLA, et al. Otolaryngology--Head and Neck Surgery : Official Journal of American Academy of Otolaryngology-Head and Neck Surgery. 2025;172 Suppl 2:S1-S47. doi:10.1002/ohn.1287. 3. Clinical Practice Guideline: Adult Sinusitis Update. Emilio SECTION, Domenica CHRISTELLA Pearline JINNY, et al. Otolaryngology--Head and Neck Surgery : Official Journal of American Academy of Otolaryngology-Head and Neck Surgery. 7974;826 Suppl 1:S1-S56. doi:10.1002/ohn.1344. 4. Chronic Rhinosinusitis. Elpidio SEVERIN, Orlando JOLYNN Orlando DOROTHA American Family Physician. 2023;108(4):370-377. 5. Medial Flap Coblation Turbinoplasty Versus Submucous Resection: Outcomes. El-Sisi HE, Etman M, Ebada HA. American Journal of Rhinology & Allergy. 2023;37(6):670-678. doi:10.1177/19458924231185727. 6. Managing Turbinate Hypertrophy: Coblation vs. Radiofrequency Treatment. Passali D, Loglisci M, Politi L, Passali GC, Kern E. European Archives of Oto-Rhino-Laryngology : Official Journal  of the [employer] (EUFOS)  : Affiliated With the [employer] for [employer] and Neck Surgery. 2016;273(6):1449-53. doi:10.1007/s00405-(415)674-7076-6. 7. Clinical Practice Guideline: Allergic Rhinitis. Michail MD, Camelia RACK, Melia ALDRICH, et al. Otolaryngology--Head and Neck Surgery : Official Journal of American Academy of Otolaryngology-Head and Neck Surgery. 7984;847(8 Suppl):S1-43. doi:10.1177/0194599814561600. 8. Systematic Review of Surgical Interventions for Inferior Turbinate Hypertrophy. Zhang K, Pipaliya RM, Miglani A, Nguyen SA, [place]. American Journal of Rhinology & Allergy. 2023;37(1):110-122. doi:10.1177/19458924221134555. 9. Emerging Roles of Coblation in Rhinology and Skull Base Surgery. [place], Hwang PH. [facility] [place]. 2017;50(3):599-606. doi:10.1016/j.otc.2017.01.010. 10. Obstructive Sleep Apnea for the Rhinologist. Henrietta JOLYNN Darrick LOISE Gigi CT, Lin FY. Current Opinion in Otolaryngology & Head and Neck Surgery. 2024;32(1):35-39. doi:10.1097/MOO.0000000000000941. 11. Referral of Adults With Obstructive Sleep Apnea for Surgical Consultation: An American Academy of Sleep Medicine Clinical Practice Guideline. Bennet JONETTA Taft JINNY Deedra RN, et al. Journal of Clinical Sleep Medicine : JCSM : Official Publication of the American Academy of Sleep Medicine. 2021;17(12):2499-2505. doi:10.5664/jcsm.N6148098. 12. The Clinical Influence of Nasal Surgery on PAP Compliance and Optimal Application Among OSA Subjects Uncomfortable With PAP Device Wear. Lovely VEAR Ora VEAR, Han SA, et al. [employer]. 2023;13(1):4383. doi:10.1038/s41598-023-31588-7. 13. Nasal Surgery for Obstructive Sleep Apnea Syndrome. Mickelson SA. [facility] [place]. 2016;49(6):1373-1381. doi:10.1016/j.otc.2016.07.002. 14. The Management of Chronic Insomnia Disorder and Obstructive Sleep Apnea (Insomnia/OSA) (2025). Liza Lee MD PhD, Beverley Ned MD, Arline Counter PhD DBSM, et al. Department of  [facility]. 15. Tolerance of Continuous Positive Airway Pressure After Sinonasal Surgery. Sigurd FRAME, Boon MS, Vimawala S, et al. [employer]. 2021;131(3):E1013-E1018. doi:10.1002/lary.71031. 16. Treatment of the Nose for Patients With Sleep Apnea. Callander JK, Tina RUSH. [facility] [place]. 2024;57(3):491-500. doi:10.1016/j.otc.2023.11.002. 17. Association of Allergic Rhinitis With Change in Nasal Congestion in New Continuous Positive Airway Pressure Users. Rod MICKEY Agent KT, Clyda HILLARY Ferrier EM. JAMA Otolaryngology-- Head & Neck Surgery. 2020;146(6):523-529. doi:10.1001/jamaoto.2020.0261. 18. A Cost-Effectiveness Analysis of Nasal Surgery to Increase Continuous Positive Airway Pressure Adherence in Sleep Apnea Patients With Nasal Obstruction. Kempfle JS, [address], Dobrowski JM, Westover MB, [place]. The Laryngoscope. 2017;127(4):977-983. doi:10.1002/lary.73742.   01/02/24 1213              This document was synthesized by artificial intelligence (Abridge) using HIPAA-compliant recording of the clinical interaction;   We discussed the use of AI scribe software for clinical note transcription with the patient, who gave verbal consent to proceed. additional Info: This encounter employed state-of-the-art, real-time, collaborative documentation. The patient actively reviewed and assisted in updating their electronic medical record on a shared screen, ensuring transparency and facilitating joint problem-solving for the problem list, overview, and plan. This approach promotes accurate, informed care. The treatment plan was discussed and reviewed in detail, including medication safety, potential side effects, and all patient questions. We confirmed understanding and comfort with the plan. Follow-up instructions were established, including contacting the office for any concerns, returning if symptoms worsen, persist, or new symptoms develop, and precautions for  potential emergency department visits.

## 2024-01-03 ENCOUNTER — Other Ambulatory Visit (HOSPITAL_BASED_OUTPATIENT_CLINIC_OR_DEPARTMENT_OTHER): Payer: Self-pay

## 2024-01-04 ENCOUNTER — Other Ambulatory Visit (HOSPITAL_BASED_OUTPATIENT_CLINIC_OR_DEPARTMENT_OTHER): Payer: Self-pay

## 2024-01-06 ENCOUNTER — Other Ambulatory Visit (HOSPITAL_BASED_OUTPATIENT_CLINIC_OR_DEPARTMENT_OTHER): Payer: Self-pay

## 2024-01-06 ENCOUNTER — Other Ambulatory Visit (HOSPITAL_COMMUNITY): Payer: Self-pay

## 2024-01-07 ENCOUNTER — Telehealth: Payer: Self-pay

## 2024-01-07 ENCOUNTER — Other Ambulatory Visit (HOSPITAL_COMMUNITY): Payer: Self-pay

## 2024-01-07 NOTE — Telephone Encounter (Signed)
 Pharmacy Patient Advocate Encounter   Received notification from Pt Calls Messages that prior authorization for Zepbound  2.5MG /0.5ML Auto-injectors  is required/requested.   Insurance verification completed.   The patient is insured through [facility] .   -Additional information submitted to [phone]

## 2024-01-07 NOTE — Telephone Encounter (Signed)
 Copied from CRM #[id number]. Topic: Clinical - Medication Prior Auth >> Jan 06, 2024  5:04 PM Chiquita SQUIBB wrote: Reason for CRM: Zuno from [employer] is calling in stating to complete the prior authorization for Zepbound  2.5 they will need the clinical information, the supporting documentation of the sleep study, and tried and failed medications for diagnose of sleep apnea. Please fax it to [phone] and a good call back number is [phone] Option 2.

## 2024-01-08 ENCOUNTER — Other Ambulatory Visit (HOSPITAL_BASED_OUTPATIENT_CLINIC_OR_DEPARTMENT_OTHER): Payer: Self-pay

## 2024-01-09 ENCOUNTER — Other Ambulatory Visit: Payer: Self-pay | Admitting: Internal Medicine

## 2024-01-09 DIAGNOSIS — L218 Other seborrheic dermatitis: Secondary | ICD-10-CM

## 2024-01-10 ENCOUNTER — Other Ambulatory Visit (HOSPITAL_BASED_OUTPATIENT_CLINIC_OR_DEPARTMENT_OTHER): Payer: Self-pay

## 2024-01-10 ENCOUNTER — Other Ambulatory Visit: Payer: Self-pay

## 2024-01-10 ENCOUNTER — Other Ambulatory Visit: Payer: Self-pay | Admitting: Pharmacist

## 2024-01-10 NOTE — Progress Notes (Signed)
 01/10/2024 Name: Stephen Anderson MRN: [id number] DOB: [birth year]  Chief Complaint  Patient presents with   Medication Management    Stephen Anderson is a 83 y.o. year old male who presented for a telephone visit.   They were referred to the pharmacist by their PCP for assistance in managing complex medication management.    Subjective:  Care Team: Primary Care Provider: Jesus Bernardino MATSU, MD ; Next Scheduled Visit: 01/28/2024 ENT: Dr Karis -Last appointment: 12/10/2023  Medication Access/Adherence  Patient's PCP was concerned about medication adherence and patient had mentioned that he would like to have all his medications filled on the same day for 90 days supply. I had an initial visit with patient in June 2025 and at that time he had decided that he did not want to use adherence packs because aspirin (every OTHER day dosing), pregabalin  (controlled medication) could no be included in adherence pack.  Patient later changed his mind and tried adherence packs for the last 2 months but has not liked the adherence packs. He would like to change back to getting medications in bottles for 90 day supply at a time.  His last adherence packs were filled 11/2023 but looks like he had prescriptions fill 01/02/2024 and this was just for 30 days.    Current Pharmacy:  [facility] Davene Salome Bernerd Venson [address] [place] [address] Phone: [phone] Fax: [phone]  [id number] [place], [place] - [address] [place] [address] Phone: [phone] Fax: [phone]   Patient reports affordability concerns with their medications: No  Patient reports access/transportation concerns to their pharmacy: No  Patient reports adherence concerns with their medications:  No     Reviewed medications list with patient and refill history.   Objective:  Lab Results  Component Value Date   HGBA1C 5.9  03/27/2023    Lab Results  Component Value Date   CREATININE 1.49 10/07/2023   BUN 31 (H) 10/07/2023   NA 142 10/07/2023   K 4.1 10/07/2023   CL 104 10/07/2023   CO2 26 10/07/2023    Lab Results  Component Value Date   CHOL 123 03/27/2023   HDL 41.00 03/27/2023   LDLCALC 65 03/27/2023   LDLDIRECT 84.0 03/06/2022   TRIG 81.0 03/27/2023   CHOLHDL 3 03/27/2023    Current Outpatient Medications  Medication Instructions   Acetaminophen (TYLENOL ARTHRITIS EXT RELIEF PO) 1 tablet, Every other day   Alpha-Lipoic Acid 600 mg, Oral, Daily   Ascorbic Acid (VITAMIN C PO) 500 mg, Daily   aspirin 81 mg, Every other day   atorvastatin  (LIPITOR) 40 mg, Oral, Daily at bedtime, add to pill packs if possible   caffeine 200 mg, Every morning   celecoxib  (CELEBREX ) 100 mg, Oral, 2 times daily, Replaces 200 mg dose, trial step down to minimize pills   Cholecalciferol  (VITAMIN D -3) 125 MCG (5000 UT) TABS 1 tablet, Oral, Daily   cilostazol  (PLETAL ) 50 mg, Oral, 2 times daily   Cyanocobalamin  (B-12 PO) 1 tablet, Daily   diclofenac  Sodium (VOLTAREN ) 2 g, Topical, 2 times daily   fluticasone  (FLONASE ) 50 MCG/ACT nasal spray Place 1 spray into both nostrils daily as needed.   ketoconazole  (NIZORAL ) 2 % shampoo APPLY TO AFFECTED AREA(S) **LET SIT FOR 3 TO 5 MINUTES BEFORE RINSING**   lidocaine  (XYLOCAINE ) 5 % ointment Apply to the affected area(s) topically as needed.   NIFEdipine  (PROCARDIA -XL/NIFEDICAL-XL) 30 mg, Oral, Daily   NON FORMULARY  CPAP   omega-3 acid ethyl esters (LOVAZA ) 2 g, Oral, 2 times daily   pantoprazole  (PROTONIX ) 20 mg, Oral, Daily, Replaces 40 mg dose, step down dose trial.   pantoprazole  (PROTONIX ) 40 mg, Oral, Daily   pregabalin  (LYRICA ) 200 mg, Oral, 2 times daily   primidone  (MYSOLINE ) 50 MG tablet Take 1 tablet (50 mg total) by mouth 2 (two) times daily in morning and evening.   rOPINIRole  (REQUIP ) 1 mg, Oral, Daily at bedtime, Start at 0.5 mg nightly, then after 1 week,  up to 1 mg nightly, for restless legs.   selenium  sulfide (SELSUN ) 2.5 % lotion 1 Application, Topical, Daily PRN   tirzepatide  (ZEPBOUND ) 2.5 mg, Subcutaneous, Weekly   triamcinolone  cream (KENALOG ) 0.1 % Apply to the affected area(s) topically 2 (two) times daily. Use for up to 14 days.       Assessment/Plan:   Medication Management: - Currently strategy insufficient - patient did not like using adherence packs.  - Again reached out to adherence packaging department to make sure that they do not fill meds going forward.  He will get medications filled at [facility]. Sent them a request to put a note on patient's chart that he would like 90 day supply going forward.   Follow Up Plan: 2 to 3 weeks to help with October refills.   Madelin Ray, PharmD Clinical Pharmacist [facility]

## 2024-01-14 ENCOUNTER — Other Ambulatory Visit (HOSPITAL_COMMUNITY): Payer: Self-pay

## 2024-01-14 NOTE — Telephone Encounter (Signed)
 Pharmacy Patient Advocate Encounter  Received notification from [facility] that Prior Authorization for Zepbound  2.5MG /0.5ML Auto-injectors has been DENIED.  Full denial letter will be uploaded to the media tab. See denial reason below.    PA #/Case ID/Reference #: ---

## 2024-01-17 ENCOUNTER — Telehealth: Payer: Self-pay | Admitting: Pharmacist

## 2024-01-17 ENCOUNTER — Other Ambulatory Visit (HOSPITAL_BASED_OUTPATIENT_CLINIC_OR_DEPARTMENT_OTHER): Payer: Self-pay

## 2024-01-17 NOTE — Telephone Encounter (Signed)
 Tried to contact patient / patient's wife regarding upcoming refill needs. Left message on voicemail with my CB # [phone].   Looks like ropinirole  was filled 01/15/24 but only for 30 day supply. Patient had asked that all meds be filled for 90 days if able.  I spoke with someone at [facility] pharmacy - patient did only get 30 days and pharmacy representative did confirm there is a note in his chart that he requested 90 day supply refills going forward. Pharmacy representative changed fill quantity and he should get 90 days the next time filled.

## 2024-01-21 ENCOUNTER — Other Ambulatory Visit: Payer: Self-pay

## 2024-01-21 ENCOUNTER — Other Ambulatory Visit (HOSPITAL_BASED_OUTPATIENT_CLINIC_OR_DEPARTMENT_OTHER): Payer: Self-pay

## 2024-01-24 ENCOUNTER — Telehealth: Payer: Self-pay

## 2024-01-24 NOTE — Telephone Encounter (Signed)
 Copied from CRM [id number]. Topic: Appointments - Scheduling Inquiry for Clinic >> Jan 23, 2024  4:02 PM Alfonso ORN wrote: Reason for CRM: pt would like the shots in his knees that were discussed by provider for his next appt 10/21 . Please confirm with patient if injection can be done at visit   Please advise.

## 2024-01-28 ENCOUNTER — Encounter: Payer: Self-pay | Admitting: Internal Medicine

## 2024-01-28 ENCOUNTER — Ambulatory Visit (INDEPENDENT_AMBULATORY_CARE_PROVIDER_SITE_OTHER): Admitting: Internal Medicine

## 2024-01-28 ENCOUNTER — Other Ambulatory Visit: Payer: Self-pay | Admitting: Pharmacist

## 2024-01-28 ENCOUNTER — Other Ambulatory Visit: Payer: Self-pay

## 2024-01-28 ENCOUNTER — Other Ambulatory Visit (HOSPITAL_BASED_OUTPATIENT_CLINIC_OR_DEPARTMENT_OTHER): Payer: Self-pay

## 2024-01-28 VITALS — BP 130/60 | HR 62 | Temp 98.0°F | Ht 70.5 in | Wt 177.6 lb

## 2024-01-28 DIAGNOSIS — I700 Atherosclerosis of aorta: Secondary | ICD-10-CM

## 2024-01-28 DIAGNOSIS — E781 Pure hyperglyceridemia: Secondary | ICD-10-CM

## 2024-01-28 DIAGNOSIS — G609 Hereditary and idiopathic neuropathy, unspecified: Secondary | ICD-10-CM | POA: Diagnosis not present

## 2024-01-28 DIAGNOSIS — I7300 Raynaud's syndrome without gangrene: Secondary | ICD-10-CM

## 2024-01-28 DIAGNOSIS — E785 Hyperlipidemia, unspecified: Secondary | ICD-10-CM

## 2024-01-28 DIAGNOSIS — M170 Bilateral primary osteoarthritis of knee: Secondary | ICD-10-CM | POA: Diagnosis not present

## 2024-01-28 DIAGNOSIS — I731 Thromboangiitis obliterans [Buerger's disease]: Secondary | ICD-10-CM

## 2024-01-28 MED ORDER — LIDOCAINE-EPINEPHRINE 1 %-1:100000 IJ SOLN
2.0000 mL | Freq: Once | INTRAMUSCULAR | Status: AC
  Administered 2024-01-28: 2 mL

## 2024-01-28 MED ORDER — METHYLPREDNISOLONE ACETATE 40 MG/ML IJ SUSP: 40.0000 mg | Freq: Once | INTRAMUSCULAR | Status: DC

## 2024-01-28 MED ORDER — LIDOCAINE-EPINEPHRINE 1 %-1:100000 IJ SOLN: 2.0000 mL | Freq: Once | INTRAMUSCULAR | Status: DC

## 2024-01-28 MED ORDER — PREGABALIN 150 MG PO CAPS
150.0000 mg | ORAL_CAPSULE | Freq: Two times a day (BID) | ORAL | 5 refills | Status: DC
  Filled 2024-01-28: qty 60, 30d supply, fill #0
  Filled 2024-04-12: qty 60, 30d supply, fill #1

## 2024-01-28 MED ORDER — METHYLPREDNISOLONE ACETATE 40 MG/ML IJ SUSP
40.0000 mg | Freq: Once | INTRAMUSCULAR | Status: AC
  Administered 2024-01-28: 40 mg via INTRA_ARTICULAR

## 2024-01-28 NOTE — Progress Notes (Signed)
 01/28/2024 Name: Stephen Anderson MRN: [id number] DOB: [birth year]  Chief Complaint  Patient presents with   Medication Adherence    Stephen Anderson is a 83 y.o. year old male who presented for a telephone visit.   They were referred to the pharmacist by their PCP for assistance in managing complex medication management.    Subjective:  Care Team: Primary Care Provider: Jesus Bernardino MATSU, MD ; Next Scheduled Visit: 02/10/2024 ENT: Dr Karis -Last appointment: 12/10/2023  Medication Access/Adherence  Patient's PCP was concerned about medication adherence and patient had mentioned that he would like to have all his medications filled on the same day for 90 days supply. I had an initial visit with patient in June 2025 and at that time he had decided that he did not want to use adherence packs because aspirin (every OTHER day dosing), pregabalin  (controlled medication) could no be included in adherence pack.  Patient later changed his mind and tried adherence packs for 2 months but did not like the adherence packs. He has changed to getting medication in bottles and would like to transition back to getting 90 to 100 day supply as able.  Most of his maintenance medications were filled 01/02/2024 for 30 days (primidone , pantoprazole  20mg  (step down dose form 40mg ), Lovaza , nifedipine , cilostazol , celecoxib  and atorvastatin .   He filled ropinorole for 30 days on 01/15/2024; pregabalin  200mg  twice a day filled 10/16 but he will transition to 150mg  twice a day with next refill in November.   Patient reports that GERD has been controled with lower dose of pantoprazole  of the last month.    Current Pharmacy:  [facility]ene Salome Bernerd Venson [address] [place] [address] Phone: [phone] Fax: [phone]  [facility] [id number] [place], [place] - [address] [place] [address] Phone: [phone] Fax:  [phone]   Patient reports affordability concerns with their medications: No  Patient reports access/transportation concerns to their pharmacy: No  Patient reports adherence concerns with their medications:  No     Reviewed medications list with patient and refill history.   Objective:  Lab Results  Component Value Date   HGBA1C 5.9 03/27/2023    Lab Results  Component Value Date   CREATININE 1.49 10/07/2023   BUN 31 (H) 10/07/2023   NA 142 10/07/2023   K 4.1 10/07/2023   CL 104 10/07/2023   CO2 26 10/07/2023    Lab Results  Component Value Date   CHOL 123 03/27/2023   HDL 41.00 03/27/2023   LDLCALC 65 03/27/2023   LDLDIRECT 84.0 03/06/2022   TRIG 81.0 03/27/2023   CHOLHDL 3 03/27/2023    Current Outpatient Medications  Medication Instructions   Acetaminophen (TYLENOL ARTHRITIS EXT RELIEF PO) 1 tablet, Every other day   Alpha-Lipoic Acid 600 mg, Oral, Daily   Ascorbic Acid (VITAMIN C PO) 500 mg, Daily   aspirin 81 mg, Every other day   atorvastatin  (LIPITOR) 40 mg, Oral, Daily at bedtime, add to pill packs if possible   caffeine 200 mg, Every morning   celecoxib  (CELEBREX ) 100 mg, Oral, 2 times daily, Replaces 200 mg dose, trial step down to minimize pills   Cholecalciferol  (VITAMIN D -3) 125 MCG (5000 UT) TABS 1 tablet, Oral, Daily   cilostazol  (PLETAL ) 50 mg, Oral, 2 times daily   Cyanocobalamin  (B-12 PO) 1 tablet, Daily   diclofenac  Sodium (VOLTAREN ) 2 g, Topical, 2 times daily   fluticasone  (FLONASE ) 50 MCG/ACT nasal spray Place  1 spray into both nostrils daily as needed.   ketoconazole  (NIZORAL ) 2 % shampoo APPLY TO AFFECTED AREA(S) **LET SIT FOR 3 TO 5 MINUTES BEFORE RINSING**   lidocaine  (XYLOCAINE ) 5 % ointment Apply to the affected area(s) topically as needed.   NON FORMULARY CPAP   pantoprazole  (PROTONIX ) 20 mg, Oral, Daily, Replaces 40 mg dose, step down dose trial.   pantoprazole  (PROTONIX ) 40 mg, Oral, Daily   pregabalin  (LYRICA ) 150 mg, Oral, 2  times daily   primidone  (MYSOLINE ) 50 MG tablet Take 1 tablet (50 mg total) by mouth 2 (two) times daily in morning and evening.   rOPINIRole  (REQUIP ) 1 mg, Oral, Daily at bedtime, Start at 0.5 mg nightly, then after 1 week, up to 1 mg nightly, for restless legs.   selenium  sulfide (SELSUN ) 2.5 % lotion 1 Application, Topical, Daily PRN   tirzepatide  (ZEPBOUND ) 2.5 mg, Subcutaneous, Weekly   triamcinolone  cream (KENALOG ) 0.1 % Apply to the affected area(s) topically 2 (two) times daily. Use for up to 14 days.       Assessment/Plan:   Medication Management: - Currently strategy insufficient - patient did not like using adherence packs. Plan to transition back to either 90 or 100 days supply filled.  - Spoke with Rocky Bullock with [facility]. Requested refills for 90 days for medications that are due now - primidone , pantoprazole , cilostazole, celecoxib  and atorvastatin . She states he needs refills for Lovaza  and nifedipine  to fill for 90 days. Sent request to PCP.     Follow Up Plan: 2 to 3 weeks to help with November refills.    Madelin Ray, PharmD Clinical Pharmacist [facility]

## 2024-01-28 NOTE — Assessment & Plan Note (Signed)
 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 The current dose of 200 mg twice daily causes side effects of feeling 'loopy'. Adjust the dosage to 150 mg twice daily for the next refill to improve tolerability while maintaining pain control. Fill the prescription one month at a time initially to assess efficacy and tolerability.

## 2024-01-28 NOTE — Patient Instructions (Addendum)
 PROCEDURE: INTRA-ARTICULAR STEROID KNEE INJECTION  BILATERAL KNEE STEROID INJECTION -- WHAT TO EXPECT ? Right after your injection You may feel no improvement or even more pain for the first 1-2 days. Improvement usually starts within 2 weeks and can last up to 6 weeks. ? Caring for your knee today Ice your knee for 20 minutes, at least twice today. You may shower, but avoid swimming, hot tubs, or baths for 24 hours. The bandage may come off after 4 hours; no need to replace it. Light activity is fine if you feel up to it. ? Possible short-term side effects Redness/flushing of skin Swelling, bruising, or soreness at the site Mild mood changes, increased appetite, or higher blood sugar Rare: yeast infection, oral thrush, acne-like rash ? Call our office if you notice: Fever above 100.60F (38C) Significant swelling, redness, or drainage at the injection site Worsening pain or no improvement after 2 weeks  Things to be aware of after injection: You may experience no significant improvement or even a slight worsening in your symptoms during the first 24 to 48 hours.  After that we expect your symptoms to improve gradually over the next 2 weeks for the medicine to have its maximal effect.  You should continue to have improvement out to 6 weeks after your injection. Dr. Jesus recommends icing the site of the injection for 20 minutes at least 2 times the day of your injection You may shower but no swimming, tub bath or Jacuzzi for 24 hours. If your bandage falls off this does not need to be replaced.  It is appropriate to remove the bandage after 4 hours. You may resume light activities as tolerated unless otherwise directed  during your visit  POSSIBLE STEROID SIDE EFFECTS:  Side effects from injectable steroids tend to be less than when taken orally however you may experience some of the symptoms listed below.  If experienced these should only last for a short period of time. Change in  menstrual flow  Edema (swelling)  Increased appetite Skin flushing (redness)  Skin rash/acne  Thrush (oral) Yeast vaginitis    Increased sweating  Depression Increased blood glucose levels Cramping and leg/calf  Euphoria (feeling happy)  POSSIBLE PROCEDURE SIDE EFFECTS: The side effects of the injection are usually fairly minimal however if you may experience some of the following side effects that are usually self-limited and will is off on their own.  If you are concerned please feel free to call the office with questions:  Increased numbness or tingling  Nausea or vomiting  Swelling or bruising at the injection site   Please call our office if if you experience any of the following symptoms over the next week as these can be signs of infection:   Fever greater than 100.60F  Significant swelling at the injection site  Significant redness or drainage from the injection site  If after 2 weeks you are continuing to have worsening symptoms please call our office to discuss what the next appropriate actions should be including the potential for a return office visit or other diagnostic testing.

## 2024-01-28 NOTE — Assessment & Plan Note (Addendum)
 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 Previous knee injections provided relief. The preferred steroid is on backorder, but the available option may offer temporary relief for upcoming travel, though it lasts only a few weeks compared to the usual 2-3 months.  Keep bandages sealed for 24 hours and avoid showering to prevent infection. Monitor for signs of infection such as fever or severe pain and report if he occurs. Encourage gentle knee movement post-injection to distribute the medication.

## 2024-01-28 NOTE — Progress Notes (Addendum)
 ==============================  [facility]: [id number]   -- Medical Office Visit --  Patient: Stephen Anderson      Age: 83 y.o.       Sex:  male  Date:   01/28/2024 Today's Healthcare Provider: Bernardino KANDICE Cone, MD  ==============================   Chief Complaint: Medication Refill (Pregablin would like to discuss that medication ) and Knee Pain (Both knees are hurting him has been for while.)  Discussed the use of AI scribe software for clinical note transcription with the patient, who gave verbal consent to proceed.  History of Present Illness  83 year old male who presents for management of neuropathy and knee pain.  He is currently taking Lyrica  (pregabalin ) for neuropathy at a dose of 200 mg in the morning and 200 mg at night. A higher dose of 200 mg twice a day previously makes him feel 'loopy'.  He experiences knee pain and has had knee injections in the past, which provided significant relief. Standing for long periods exacerbates his knee pain. Previous injections were effective and painless, and he hopes for similar results this time.  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 He also recalls having attention deficit issues during his school years, which were not well understood at the time.  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, not believe to be due to  lupus or systemic sclerosis; Diverticular disease; Right inguinal hernia; Aortic atherosclerosis; Bronchiectasis (HCC); Recurrent kidney stones; Calcification of prostate; DDD (degenerative disc disease), lumbar; LVH (left ventricular hypertrophy); Grade I diastolic dysfunction; Weight loss; Gastritis; Hiatal hernia; Lumbar back pain; Arthritis of both hands; Trigger finger, right ring finger; Chronic kidney disease, stage 2 (mild); Prediabetes; 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 Past Surgical History:   has a past surgical history that includes Gallbladder surgery; Hernia repair; Cataract extraction, bilateral; and Melanoma excision. Social History:   reports that he quit smoking about 15 years ago. His smoking use included cigarettes. He started smoking about 65 years ago. He has a 50 pack-year smoking history. He has never been exposed to tobacco smoke. He quit smokeless tobacco use about 21 years  ago. He reports that he does not drink alcohol and does not use drugs. Family History:  family history includes Bladder Cancer in his brother; Heart disease in his brother; Lung cancer in his father; Neuropathy  in his daughter. Allergies:  is allergic to hydrocodone, oxycodone, oxycontin [oxycodone hcl], and simvastatin.   Medication Reconciliation: Current Outpatient Medications on File Prior to Visit  Medication Sig   Acetaminophen (TYLENOL ARTHRITIS EXT RELIEF PO) Take 1 tablet by mouth every other day.   Alpha-Lipoic Acid 300 MG TABS Take 2 tablets (600 mg total) by mouth daily at 6 (six) AM.   Ascorbic Acid (VITAMIN C PO) Take 500 mg by mouth daily.   aspirin 81 MG tablet Take 81 mg by mouth every other day.    atorvastatin  (LIPITOR) 40 MG tablet Take 1 tablet (40 mg total) by mouth at bedtime. add to pill packs if possible   caffeine 200 MG TABS tablet Take 200 mg by mouth in the morning. STAY AWAKE (Patient not taking: Reported on 12/24/2023)   celecoxib  (CELEBREX ) 100 MG capsule Take 1 capsule (100 mg total) by mouth 2 (two) times daily. Replaces 200 mg dose, trial step down to minimize pills   Cholecalciferol  (VITAMIN D -3) 125 MCG (5000 UT) TABS Take 1 tablet by mouth daily.   cilostazol  (PLETAL ) 50 MG tablet Take 1 tablet (50 mg total) by mouth 2 (two) times daily.   Cyanocobalamin  (B-12 PO) Take 1 tablet by mouth daily.   diclofenac  Sodium (VOLTAREN ) 1 % GEL Apply 2 g topically in the morning and at bedtime.   fluticasone  (FLONASE ) 50 MCG/ACT nasal spray Place 1 spray into both nostrils daily as needed.   ketoconazole  (NIZORAL ) 2 % shampoo APPLY TO AFFECTED AREA(S) **LET SIT FOR 3 TO 5 MINUTES BEFORE RINSING**   lidocaine  (XYLOCAINE ) 5 % ointment Apply to the affected area(s) topically as needed.   NIFEdipine  (PROCARDIA -XL/NIFEDICAL-XL) 30 MG 24 hr tablet Take 1 tablet (30 mg total) by mouth daily.   NON FORMULARY CPAP   omega-3 acid ethyl esters (LOVAZA ) 1 g capsule Take 2  capsules (2 g total) by mouth 2 (two) times daily.   pantoprazole  (PROTONIX ) 20 MG tablet Take 1 tablet (20 mg total) by mouth daily. Replaces 40 mg dose, step down dose trial.   pantoprazole  (PROTONIX ) 40 MG tablet Take 1 tablet (40 mg total) by mouth daily.   primidone  (MYSOLINE ) 50 MG tablet Take 1 tablet (50 mg total) by mouth 2 (two) times daily in morning and evening.   rOPINIRole  (REQUIP ) 1 MG tablet Take 1 tablet (1 mg total) by mouth at bedtime. Start at 0.5 mg nightly, then after 1 week, up to 1 mg nightly, for restless legs.   selenium  sulfide (SELSUN ) 2.5 % lotion Apply 1 Application topically daily as needed for irritation.   tirzepatide  (ZEPBOUND ) 2.5 MG/0.5ML Pen Inject 2.5 mg into the skin once a week.   triamcinolone  cream (KENALOG ) 0.1 % Apply to the affected area(s) topically 2 (two) times daily. Use for up to 14 days.   No current facility-administered medications on file prior to visit.   Medications Discontinued During This Encounter  Medication Reason   pregabalin  (LYRICA ) 200 MG capsule    lidocaine -EPINEPHrine (XYLOCAINE  W/EPI) 1 %-1:100000 (with pres) injection 2 mL    methylPREDNISolone acetate (DEPO-MEDROL) injection 40 mg      Physical Exam:    01/28/2024    7:48 AM 01/02/2024   11:20 AM 12/19/2023    7:51 AM  Vitals with BMI  Height 5' 10.5 5' 10.5 5' 10.5  Weight 177 lbs 10 oz 181 lbs 6 oz 174 lbs 6 oz  BMI 25.11 25.65 24.66  Systolic 130 112 889  Diastolic 60  60 78  Pulse 62 68 78  Vital signs reviewed.  Nursing notes reviewed. Weight trend reviewed. Physical Activity: Not on file   General Appearance:  No acute distress appreciable.   Well-groomed, healthy-appearing male.  Well proportioned with no abnormal fat distribution.  Good muscle tone. Pulmonary:  Normal work of breathing at rest, no respiratory distress apparent. SpO2: 98 %  Musculoskeletal: All extremities are intact.  Neurological:  Awake, alert, oriented, and engaged.  No obvious  focal neurological deficits or cognitive impairments.  Sensorium seems unclouded.   Speech is clear and coherent with logical content. Psychiatric:  Appropriate mood, pleasant and cooperative demeanor, thoughtful and engaged during the exam  Verbalized to patient: Physical Exam MUSCULOSKELETAL: Knees without abnormalities. Steroid and lidocaine  injections administered to both knees.  Results Procedure: Intra-articular knee injection Description: Injection of hyaluronic acid and lidocaine  into each knee joint. No bleeding observed post-injection.     05/17/2023    8:48 AM 05/01/2023    8:35 AM 03/27/2023    8:50 AM 01/16/2023    8:45 AM  PHQ 2/9 Scores  PHQ - 2 Score 0 0 0 1  PHQ- 9 Score  2 1 4      Office Visit on 10/07/2023  Component Date Value Ref Range Status   WBC 10/07/2023 8.0  4.0 - 10.5 K/uL Final   RBC 10/07/2023 4.90  4.22 - 5.81 Mil/uL Final   Hemoglobin 10/07/2023 14.8  13.0 - 17.0 g/dL Final   HCT 93/69/7974 44.0  39.0 - 52.0 % Final   MCV 10/07/2023 89.9  78.0 - 100.0 fl Final   MCHC 10/07/2023 33.6  30.0 - 36.0 g/dL Final   RDW 93/69/7974 14.1  11.5 - 15.5 % Final   Platelets 10/07/2023 227.0  150.0 - 400.0 K/uL Final   Neutrophils Relative % 10/07/2023 58.8  43.0 - 77.0 % Final   Lymphocytes Relative 10/07/2023 27.5  12.0 - 46.0 % Final   Monocytes Relative 10/07/2023 10.4  3.0 - 12.0 % Final   Eosinophils Relative 10/07/2023 2.2  0.0 - 5.0 % Final   Basophils Relative 10/07/2023 1.1  0.0 - 3.0 % Final   Neutro Abs 10/07/2023 4.7  1.4 - 7.7 K/uL Final   Lymphs Abs 10/07/2023 2.2  0.7 - 4.0 K/uL Final   Monocytes Absolute 10/07/2023 0.8  0.1 - 1.0 K/uL Final   Eosinophils Absolute 10/07/2023 0.2  0.0 - 0.7 K/uL Final   Basophils Absolute 10/07/2023 0.1  0.0 - 0.1 K/uL Final   Sodium 10/07/2023 142  135 - 145 mEq/L Final   Potassium 10/07/2023 4.1  3.5 - 5.1 mEq/L Final   Chloride 10/07/2023 104  96 - 112 mEq/L Final   CO2 10/07/2023 26  19 - 32 mEq/L Final    Glucose, Bld 10/07/2023 94  70 - 99 mg/dL Final   BUN 93/69/7974 31 (H)  6 - 23 mg/dL Final   Creatinine, Ser 10/07/2023 1.49  0.40 - 1.50 mg/dL Final   Total Bilirubin 10/07/2023 0.5  0.2 - 1.2 mg/dL Final   Alkaline Phosphatase 10/07/2023 82  39 - 117 U/L Final   AST 10/07/2023 26  0 - 37 U/L Final   ALT 10/07/2023 40  0 - 53 U/L Final   Total Protein 10/07/2023 7.1  6.0 - 8.3 g/dL Final   Albumin 93/69/7974 4.6  3.5 - 5.2 g/dL Final   GFR 93/69/7974 43.80 (L)  >60.00 mL/min Final   Calcium  10/07/2023 9.4  8.4 - 10.5 mg/dL Final  Sed Rate 10/07/2023 7  0 - 20 mm/hr Final   Pro B Natriuretic peptide (BNP) 10/07/2023 12.0  0.0 - 100.0 pg/mL Final   Vitamin B-12 10/07/2023 1,138 (H)  211 - 911 pg/mL Final   Folate 10/07/2023 >23.2  >5.9 ng/mL Final   Rheumatoid fact SerPl-aCnc 10/07/2023 <10  <14 IU/mL Final   Anti Nuclear Antibody (ANA) 10/07/2023 POSITIVE (A)  NEGATIVE Final   ANA Titer 1 10/07/2023 1:40 (H)  titer Final   ANA Pattern 1 10/07/2023 Nuclear, Homogeneous (A)   Final  Office Visit on 08/21/2023  Component Date Value Ref Range Status   MICRO NUMBER: 08/21/2023 [id number]   Final   SPECIMEN QUALITY: 08/21/2023 Adequate   Final   Source: 08/21/2023 NOT GIVEN   Final   STATUS: 08/21/2023 FINAL   Final   GRAM STAIN: 08/21/2023 No epithelial cells seen No white blood cells seen No organisms seen   Final   ANA RESULT: 08/21/2023 No anaerobes isolated.   Final   MICRO NUMBER: 08/21/2023 [id number]   Final   SPECIMEN QUALITY: 08/21/2023 Adequate   Final   SOURCE: 08/21/2023 NOT GIVEN   Final   STATUS: 08/21/2023 FINAL   Final   AER RESULT: 08/21/2023 No Growth   Final   COMMENT: 08/21/2023 No source was provided. The specimen was tested and reported based upon the test code ordered. If this is incorrect, please contact client services.   Final  Office Visit on 08/14/2023  Component Date Value Ref Range Status   WBC 08/14/2023 11.4 (H)  4.0 - 10.5 K/uL Final   RBC 08/14/2023 4.23   4.22 - 5.81 Mil/uL Final   Hemoglobin 08/14/2023 13.0  13.0 - 17.0 g/dL Final   HCT 94/92/7974 38.3 (L)  39.0 - 52.0 % Final   MCV 08/14/2023 90.5  78.0 - 100.0 fl Final   MCHC 08/14/2023 33.9  30.0 - 36.0 g/dL Final   RDW 94/92/7974 13.9  11.5 - 15.5 % Final   Platelets 08/14/2023 228.0  150.0 - 400.0 K/uL Final   Neutrophils Relative % 08/14/2023 79.4 (H)  43.0 - 77.0 % Final   Lymphocytes Relative 08/14/2023 13.4  12.0 - 46.0 % Final   Monocytes Relative 08/14/2023 6.2  3.0 - 12.0 % Final   Eosinophils Relative 08/14/2023 0.2  0.0 - 5.0 % Final   Basophils Relative 08/14/2023 0.8  0.0 - 3.0 % Final   Neutro Abs 08/14/2023 9.0 (H)  1.4 - 7.7 K/uL Final   Lymphs Abs 08/14/2023 1.5  0.7 - 4.0 K/uL Final   Monocytes Absolute 08/14/2023 0.7  0.1 - 1.0 K/uL Final   Eosinophils Absolute 08/14/2023 0.0  0.0 - 0.7 K/uL Final   Basophils Absolute 08/14/2023 0.1  0.0 - 0.1 K/uL Final   Sodium 08/14/2023 137  135 - 145 mEq/L Final   Potassium 08/14/2023 4.8  3.5 - 5.1 mEq/L Final   Chloride 08/14/2023 105  96 - 112 mEq/L Final   CO2 08/14/2023 24  19 - 32 mEq/L Final   Glucose, Bld 08/14/2023 119 (H)  70 - 99 mg/dL Final   BUN 94/92/7974 27 (H)  6 - 23 mg/dL Final   Creatinine, Ser 08/14/2023 1.11  0.40 - 1.50 mg/dL Final   Total Bilirubin 08/14/2023 0.2  0.2 - 1.2 mg/dL Final   Alkaline Phosphatase 08/14/2023 74  39 - 117 U/L Final   AST 08/14/2023 28  0 - 37 U/L Final   ALT 08/14/2023 38  0 - 53 U/L Final  Total Protein 08/14/2023 7.0  6.0 - 8.3 g/dL Final   Albumin 94/92/7974 3.7  3.5 - 5.2 g/dL Final   GFR 94/92/7974 62.43  >60.00 mL/min Final   Calcium  08/14/2023 8.7  8.4 - 10.5 mg/dL Final   Pro B Natriuretic peptide (BNP) 08/14/2023 24.0  0.0 - 100.0 pg/mL Final   CRP 08/14/2023 4.0  0.5 - 20.0 mg/dL Final   Sed Rate 94/92/7974 44 (H)  0 - 20 mm/hr Final   Vitamin B-12 08/14/2023 634  211 - 911 pg/mL Final   Folate 08/14/2023 13.1  >5.9 ng/mL Final   SARS Coronavirus 2 Ag  08/14/2023 Negative  Negative Final  Office Visit on 07/31/2023  Component Date Value Ref Range Status   WBC 07/31/2023 7.0  4.0 - 10.5 K/uL Final   RBC 07/31/2023 4.75  4.22 - 5.81 Mil/uL Final   Hemoglobin 07/31/2023 14.5  13.0 - 17.0 g/dL Final   HCT 95/76/7974 43.5  39.0 - 52.0 % Final   MCV 07/31/2023 91.7  78.0 - 100.0 fl Final   MCHC 07/31/2023 33.3  30.0 - 36.0 g/dL Final   RDW 95/76/7974 14.0  11.5 - 15.5 % Final   Platelets 07/31/2023 167.0  150.0 - 400.0 K/uL Final   Neutrophils Relative % 07/31/2023 64.7  43.0 - 77.0 % Final   Lymphocytes Relative 07/31/2023 24.9  12.0 - 46.0 % Final   Monocytes Relative 07/31/2023 7.5  3.0 - 12.0 % Final   Eosinophils Relative 07/31/2023 2.3  0.0 - 5.0 % Final   Basophils Relative 07/31/2023 0.6  0.0 - 3.0 % Final   Neutro Abs 07/31/2023 4.5  1.4 - 7.7 K/uL Final   Lymphs Abs 07/31/2023 1.7  0.7 - 4.0 K/uL Final   Monocytes Absolute 07/31/2023 0.5  0.1 - 1.0 K/uL Final   Eosinophils Absolute 07/31/2023 0.2  0.0 - 0.7 K/uL Final   Basophils Absolute 07/31/2023 0.0  0.0 - 0.1 K/uL Final   Sodium 07/31/2023 138  135 - 145 mEq/L Final   Potassium 07/31/2023 4.2  3.5 - 5.1 mEq/L Final   Chloride 07/31/2023 104  96 - 112 mEq/L Final   CO2 07/31/2023 28  19 - 32 mEq/L Final   Glucose, Bld 07/31/2023 101 (H)  70 - 99 mg/dL Final   BUN 95/76/7974 27 (H)  6 - 23 mg/dL Final   Creatinine, Ser 07/31/2023 1.18  0.40 - 1.50 mg/dL Final   Total Bilirubin 07/31/2023 0.6  0.2 - 1.2 mg/dL Final   Alkaline Phosphatase 07/31/2023 59  39 - 117 U/L Final   AST 07/31/2023 16  0 - 37 U/L Final   ALT 07/31/2023 14  0 - 53 U/L Final   Total Protein 07/31/2023 6.7  6.0 - 8.3 g/dL Final   Albumin 95/76/7974 4.4  3.5 - 5.2 g/dL Final   GFR 95/76/7974 58.03 (L)  >60.00 mL/min Final   Calcium  07/31/2023 8.8  8.4 - 10.5 mg/dL Final   Phosphorus 95/76/7974 2.6  2.3 - 4.6 mg/dL Final   VITD 95/76/7974 47.95  30.00 - 100.00 ng/mL Final   PTH 07/31/2023 49  16 - 77  pg/mL Final   Uric Acid, Serum 07/31/2023 5.8  4.0 - 7.8 mg/dL Final   Color, Urine 95/76/7974 YELLOW  YELLOW Final   APPearance 07/31/2023 CLEAR  CLEAR Final   Specific Gravity, Urine 07/31/2023 1.019  1.001 - 1.035 Final   pH 07/31/2023 6.0  5.0 - 8.0 Final   Glucose, UA 07/31/2023 NEGATIVE  NEGATIVE Final  Bilirubin Urine 07/31/2023 NEGATIVE  NEGATIVE Final   Ketones, ur 07/31/2023 NEGATIVE  NEGATIVE Final   Hgb urine dipstick 07/31/2023 NEGATIVE  NEGATIVE Final   Protein, ur 07/31/2023 NEGATIVE  NEGATIVE Final   Nitrites, Initial 07/31/2023 NEGATIVE  NEGATIVE Final   Leukocyte Esterase 07/31/2023 NEGATIVE  NEGATIVE Final   WBC, UA 07/31/2023 NONE SEEN  0 - 5 /HPF Final   RBC / HPF 07/31/2023 NONE SEEN  0 - 2 /HPF Final   Squamous Epithelial / HPF 07/31/2023 NONE SEEN  < OR = 5 /HPF Final   Bacteria, UA 07/31/2023 NONE SEEN  NONE SEEN /HPF Final   Hyaline Cast 07/31/2023 NONE SEEN  NONE SEEN /LPF Final   Note 07/31/2023    Final   Vitamin B-12 07/31/2023 483  211 - 911 pg/mL Final   Methylmalonic Acid, Quant 07/31/2023 106  85 - 423 nmol/L Final   Ceruloplasmin 07/31/2023 20  14 - 30 mg/dL Final   Zinc  07/31/2023 60  60 - 130 mcg/dL Final   Total Protein 95/76/7974 6.4  6.1 - 8.1 g/dL Final   Albumin ELP 95/76/7974 4.1  3.8 - 4.8 g/dL Final   Alpha 1 95/76/7974 0.2  0.2 - 0.3 g/dL Final   Alpha 2 95/76/7974 0.7  0.5 - 0.9 g/dL Final   Beta Globulin 95/76/7974 0.4  0.4 - 0.6 g/dL Final   Beta 2 95/76/7974 0.3  0.2 - 0.5 g/dL Final   Gamma Globulin 07/31/2023 0.7 (L)  0.8 - 1.7 g/dL Final   Abnormal Protein Band1 07/31/2023   NONE DETECTED g/dL Final   SPE Interp. 95/76/7974    Final   Protein, Ur 08/02/2023 9.3  Not Estab. mg/dL Final   Protein, 75Y Urine 08/02/2023 Comment  30 - 150 mg/24 hr Final   ALBUMIN, U 08/02/2023 100.0  % Final   ALPHA 1 URINE 08/02/2023 0.0  % Final   ALPHA-2-GLOBULIN, U 08/02/2023 0.0  % Final   % BETA, Urine 08/02/2023 0.0  % Final   GAMMA  GLOBULIN URINE 08/02/2023 0.0  % Final   M-SPIKE, % 08/02/2023 Not Observed  Not Observed % Final   M-Spike, mg/24 hr 08/02/2023 Comment  Not Observed mg/24 hr Final   Immunofixation, Urine 08/02/2023 Comment   Final   NOTE: 08/02/2023 Comment   Final   Free Kappa Lt Chains,Ur 08/02/2023 45.86  1.17 - 86.46 mg/L Final   Free Lambda Lt Chains,Ur 08/02/2023 8.15  0.27 - 15.21 mg/L Final   Kappa/Lambda Ratio,U 08/02/2023 5.63  1.83 - 14.26 Final   ANCA SCREEN 07/31/2023 Negative  Negative Final   Sed Rate 07/31/2023 4  0 - 20 mm/hr Final   CRP 07/31/2023 <1.0  0.5 - 20.0 mg/dL Final   Reflexve Urine Culture 07/31/2023    Final  Office Visit on 03/27/2023  Component Date Value Ref Range Status   Vitamin B-12 03/27/2023 1,492 (H)  211 - 911 pg/mL Final   Folate 03/27/2023 16.0  >5.9 ng/mL Final   Hgb A1c MFr Bld 03/27/2023 5.9  4.6 - 6.5 % Final   Ferritin 03/27/2023 53.8  22.0 - 322.0 ng/mL Final   Sodium 03/27/2023 142  135 - 145 mEq/L Final   Potassium 03/27/2023 4.3  3.5 - 5.1 mEq/L Final   Chloride 03/27/2023 105  96 - 112 mEq/L Final   CO2 03/27/2023 30  19 - 32 mEq/L Final   Glucose, Bld 03/27/2023 109 (H)  70 - 99 mg/dL Final   BUN 87/81/7975 21  6 - 23 mg/dL Final   Creatinine, Ser 03/27/2023 1.03  0.40 - 1.50 mg/dL Final   Total Bilirubin 03/27/2023 0.5  0.2 - 1.2 mg/dL Final   Alkaline Phosphatase 03/27/2023 68  39 - 117 U/L Final   AST 03/27/2023 17  0 - 37 U/L Final   ALT 03/27/2023 14  0 - 53 U/L Final   Total Protein 03/27/2023 6.8  6.0 - 8.3 g/dL Final   Albumin 87/81/7975 4.5  3.5 - 5.2 g/dL Final   GFR 87/81/7975 68.48  >60.00 mL/min Final   Calcium  03/27/2023 8.9  8.4 - 10.5 mg/dL Final   WBC 87/81/7975 6.3  4.0 - 10.5 K/uL Final   RBC 03/27/2023 4.74  4.22 - 5.81 Mil/uL Final   Hemoglobin 03/27/2023 14.6  13.0 - 17.0 g/dL Final   HCT 87/81/7975 43.8  39.0 - 52.0 % Final   MCV 03/27/2023 92.3  78.0 - 100.0 fl Final   MCHC 03/27/2023 33.3  30.0 - 36.0 g/dL Final    RDW 87/81/7975 14.0  11.5 - 15.5 % Final   Platelets 03/27/2023 185.0  150.0 - 400.0 K/uL Final   Neutrophils Relative % 03/27/2023 60.8  43.0 - 77.0 % Final   Lymphocytes Relative 03/27/2023 27.2  12.0 - 46.0 % Final   Monocytes Relative 03/27/2023 9.0  3.0 - 12.0 % Final   Eosinophils Relative 03/27/2023 2.3  0.0 - 5.0 % Final   Basophils Relative 03/27/2023 0.7  0.0 - 3.0 % Final   Neutro Abs 03/27/2023 3.8  1.4 - 7.7 K/uL Final   Lymphs Abs 03/27/2023 1.7  0.7 - 4.0 K/uL Final   Monocytes Absolute 03/27/2023 0.6  0.1 - 1.0 K/uL Final   Eosinophils Absolute 03/27/2023 0.1  0.0 - 0.7 K/uL Final   Basophils Absolute 03/27/2023 0.0  0.0 - 0.1 K/uL Final   Cholesterol 03/27/2023 123  0 - 200 mg/dL Final   Triglycerides 87/81/7975 81.0  0.0 - 149.0 mg/dL Final   HDL 87/81/7975 41.00  >39.00 mg/dL Final   VLDL 87/81/7975 16.2  0.0 - 40.0 mg/dL Final   LDL Cholesterol 03/27/2023 65  0 - 99 mg/dL Final   Total CHOL/HDL Ratio 03/27/2023 3   Final   NonHDL 03/27/2023 81.51   Final   VITD 03/27/2023 34.36  30.00 - 100.00 ng/mL Final   Anti Nuclear Antibody (ANA) 03/27/2023 Negative  Negative Final   Rheumatoid fact SerPl-aCnc 03/27/2023 <10  <14 IU/mL Final   T3 Uptake 03/27/2023 28  22 - 35 % Final   T4, Total 03/27/2023 7.5  4.9 - 10.5 mcg/dL Final   Free Thyroxine Index 03/27/2023 2.1  1.4 - 3.8 Final   TSH 03/27/2023 1.05  0.40 - 4.50 mIU/L Final   Arsenic, 24H Ur 04/04/2023 <10  <=80 mcg/L Final   Lead, 24 hr urine 04/04/2023 <10  <80 mcg/L Final   Mercury, 24H Ur 04/04/2023 <4  <=20 mcg/L Final  Office Visit on 09/24/2022  Component Date Value Ref Range Status   Rheumatoid fact SerPl-aCnc 09/24/2022 <10  <14 IU/mL Final   Sed Rate 09/24/2022 2  0 - 20 mm/h Final   Scleroderma (Scl-70) (ENA) Antibod* 09/24/2022 <1.0 NEG  <1.0 NEG AI Final   Ribonucleic Protein(ENA) Antibody,* 09/24/2022 <1.0 NEG  <1.0 NEG AI Final   ENA SM Ab Ser-aCnc 09/24/2022 <1.0 NEG  <1.0 NEG AI Final   SSA (Ro)  (ENA) Antibody, IgG 09/24/2022 <1.0 NEG  <1.0 NEG AI Final   ds DNA Ab 09/24/2022  1  IU/mL Final   C3 Complement 09/24/2022 124  82 - 185 mg/dL Final   C4 Complement 93/82/7975 21  15 - 53 mg/dL Final  Office Visit on 09/05/2022  Component Date Value Ref Range Status   WBC 09/05/2022 6.9  4.0 - 10.5 K/uL Final   RBC 09/05/2022 4.71  4.22 - 5.81 Mil/uL Final   Hemoglobin 09/05/2022 14.1  13.0 - 17.0 g/dL Final   HCT 94/70/7975 42.4  39.0 - 52.0 % Final   MCV 09/05/2022 90.2  78.0 - 100.0 fl Final   MCHC 09/05/2022 33.2  30.0 - 36.0 g/dL Final   RDW 94/70/7975 14.9  11.5 - 15.5 % Final   Platelets 09/05/2022 158.0  150.0 - 400.0 K/uL Final   Neutrophils Relative % 09/05/2022 62.2  43.0 - 77.0 % Final   Lymphocytes Relative 09/05/2022 25.1  12.0 - 46.0 % Final   Monocytes Relative 09/05/2022 9.9  3.0 - 12.0 % Final   Eosinophils Relative 09/05/2022 2.2  0.0 - 5.0 % Final   Basophils Relative 09/05/2022 0.6  0.0 - 3.0 % Final   Neutro Abs 09/05/2022 4.3  1.4 - 7.7 K/uL Final   Lymphs Abs 09/05/2022 1.7  0.7 - 4.0 K/uL Final   Monocytes Absolute 09/05/2022 0.7  0.1 - 1.0 K/uL Final   Eosinophils Absolute 09/05/2022 0.2  0.0 - 0.7 K/uL Final   Basophils Absolute 09/05/2022 0.0  0.0 - 0.1 K/uL Final   Ferritin 09/05/2022 29.4  22.0 - 322.0 ng/mL Final   Cholesterol 09/05/2022 123  0 - 200 mg/dL Final   Triglycerides 94/70/7975 160.0 (H)  0.0 - 149.0 mg/dL Final   HDL 94/70/7975 34.60 (L)  >60.99 mg/dL Final   VLDL 94/70/7975 32.0  0.0 - 40.0 mg/dL Final   LDL Cholesterol 09/05/2022 57  0 - 99 mg/dL Final   Total CHOL/HDL Ratio 09/05/2022 4   Final   NonHDL 09/05/2022 88.83   Final   Sodium 09/05/2022 142  135 - 145 mEq/L Final   Potassium 09/05/2022 4.4  3.5 - 5.1 mEq/L Final   Chloride 09/05/2022 107  96 - 112 mEq/L Final   CO2 09/05/2022 28  19 - 32 mEq/L Final   Glucose, Bld 09/05/2022 101 (H)  70 - 99 mg/dL Final   BUN 94/70/7975 19  6 - 23 mg/dL Final   Creatinine, Ser 09/05/2022  1.17  0.40 - 1.50 mg/dL Final   Total Bilirubin 09/05/2022 0.4  0.2 - 1.2 mg/dL Final   Alkaline Phosphatase 09/05/2022 73  39 - 117 U/L Final   AST 09/05/2022 18  0 - 37 U/L Final   ALT 09/05/2022 15  0 - 53 U/L Final   Total Protein 09/05/2022 6.5  6.0 - 8.3 g/dL Final   Albumin 94/70/7975 4.0  3.5 - 5.2 g/dL Final   GFR 94/70/7975 59.00 (L)  >60.00 mL/min Final   Calcium  09/05/2022 8.6  8.4 - 10.5 mg/dL Final   VITD 94/70/7975 46.57  30.00 - 100.00 ng/mL Final   Magnesium 09/05/2022 1.9  1.5 - 2.5 mg/dL Final   Phosphorus 94/70/7975 2.1 (L)  2.3 - 4.6 mg/dL Final   Uric Acid, Serum 09/05/2022 5.7  4.0 - 7.8 mg/dL Final  Scanned Document on 06/20/2022  Component Date Value Ref Range Status   [facility] Colonoscopy 06/20/2022 See Report (in chart)  See Report (in chart), Patient Reported Final  Appointment on 06/04/2022  Component Date Value Ref Range Status   Weight 06/04/2022 2,848  oz Final  Height 06/04/2022 70  in Final   BP 06/04/2022 122/70  mmHg Final   S' Lateral 06/04/2022 2.15  cm Final   Area-P 1/2 06/04/2022 3.77  cm2 Final   MV M vel 06/04/2022 3.23  m/s Final   MV Peak grad 06/04/2022 41.6  mmHg Final   Est EF 06/04/2022 55 - 60%   Final  Hospital Outpatient Visit on 05/20/2022  Component Date Value Ref Range Status   Creatinine, Ser 05/20/2022 1.30 (H)  0.61 - 1.24 mg/dL Final  There may be more visits with results that are not included.  No image results found. No results found.   PROCEDURE: INTRA-ARTICULAR STEROID KNEE INJECTION   PROCEDURE: INTRA-ARTICULAR STEROID KNEE INJECTION - BILATERAL KNEES  Consent and Preparation: Verbal consent obtained and verified for knee injection. Sterile Betadine prep performed. Topical analgesic spray: Ethyl chloride.  Procedure: After consent was obtained, using sterile technique the bilateral knees was prepped with betadine and alcohol swab. .  3 cc syringe containing steroid 40 mg and 2 ml Lidocaine  with epi was then  injected and the needle withdrawn.  The procedure was well tolerated.  The left knee was injected by the medial approach, and then identical medication was injected in the right knee by the lateral approach.  The injection site was dressed with bandage and sealed with transparent dressing   Post-Procedure Instructions: Anticipate little to no improvement during the first 24-48 hours; symptoms may briefly worsen before improving. Gradual improvement expected within 2 weeks, with benefit lasting up to 6 weeks. Ice injection site: 20 minutes, twice on the day of procedure. Resume light activity as tolerated unless otherwise instructed. Shower permitted; avoid swimming, tubs, or Jacuzzis for 24 hours. Bandage may be removed after 4 hours; replacement not required if it falls off sooner. Possible Steroid Side Effects: Flushing, rash/acne Oral thrush, yeast vaginitis Mood changes (euphoria or depression) Edema, increased sweating, calf/leg cramping Possible Injection-Related Side Effects: Local numbness/tingling Nausea/vomiting Mild swelling or bruising When to Call the Office: Fever > 100.37F (38C) Significant swelling, redness, or drainage at the site Worsening symptoms persisting beyond 2 weeks        ASSESSMENT & PLAN   Assessment & Plan Knee arthritis, bilateral Bilateral knee osteoarthritis   Chronic bilateral knee osteoarthritis causes significant pain, especially with prolonged standing. Previous knee injections provided relief. The preferred steroid is on backorder, but the available option may offer temporary relief for upcoming travel, though it lasts only a few weeks compared to the usual 2-3 months.  Keep bandages sealed for 24 hours and avoid showering to prevent infection. Monitor for signs of infection such as fever or severe pain and report if he occurs. Encourage gentle knee movement post-injection to distribute the medication. Idiopathic peripheral  neuropathy Peripheral neuropathy is managed with pregabalin . The current dose of 200 mg twice daily causes side effects of feeling 'loopy'. Adjust the dosage to 150 mg twice daily for the next refill to improve tolerability while maintaining pain control. Fill the prescription one month at a time initially to assess efficacy and tolerability.         Orders Placed During this Encounter:   METHYLPREDNISOLONE ACETATE 40 MG/ML IJ SUSP   Once  1 mL in left knee      LIDOCAINE -EPINEPHRINE 1 %-1:100000 IJ SOLN   Once    2 ml  in left knee    METHYLPREDNISOLONE ACETATE 40 MG/ML IJ SUSP   Once  1 mL  in right knee  LIDOCAINE -EPINEPHRINE 1 %-1:100000 IJ SOLN   Once,   2 mL in right knee        pregabalin  (LYRICA ) 150 MG capsule  2 times daily       Note to Pharmacy: Dose change fill when due.            This document was synthesized by artificial intelligence (Abridge) using HIPAA-compliant recording of the clinical interaction;   We discussed the use of AI scribe software for clinical note transcription with the patient, who gave verbal consent to proceed. additional Info: This encounter employed state-of-the-art, real-time, collaborative documentation. The patient actively reviewed and assisted in updating their electronic medical record on a shared screen, ensuring transparency and facilitating joint problem-solving for the problem list, overview, and plan. This approach promotes accurate, informed care. The treatment plan was discussed and reviewed in detail, including medication safety, potential side effects, and all patient questions. We confirmed understanding and comfort with the plan. Follow-up instructions were established, including contacting the office for any concerns, returning if symptoms worsen, persist, or new symptoms develop, and precautions for potential emergency department visits.

## 2024-01-29 ENCOUNTER — Other Ambulatory Visit (HOSPITAL_BASED_OUTPATIENT_CLINIC_OR_DEPARTMENT_OTHER): Payer: Self-pay

## 2024-01-29 MED ORDER — OMEGA-3-ACID ETHYL ESTERS 1 G PO CAPS
2.0000 g | ORAL_CAPSULE | Freq: Two times a day (BID) | ORAL | 1 refills | Status: DC
  Filled 2024-01-29: qty 360, 90d supply, fill #0

## 2024-01-29 MED ORDER — NIFEDIPINE ER OSMOTIC RELEASE 30 MG PO TB24
30.0000 mg | ORAL_TABLET | Freq: Every day | ORAL | 1 refills | Status: DC
  Filled 2024-01-29: qty 90, 90d supply, fill #0

## 2024-01-30 ENCOUNTER — Other Ambulatory Visit (HOSPITAL_BASED_OUTPATIENT_CLINIC_OR_DEPARTMENT_OTHER): Payer: Self-pay

## 2024-02-03 ENCOUNTER — Other Ambulatory Visit (HOSPITAL_BASED_OUTPATIENT_CLINIC_OR_DEPARTMENT_OTHER): Payer: Self-pay

## 2024-02-05 ENCOUNTER — Other Ambulatory Visit (HOSPITAL_BASED_OUTPATIENT_CLINIC_OR_DEPARTMENT_OTHER): Payer: Self-pay

## 2024-02-06 ENCOUNTER — Other Ambulatory Visit (HOSPITAL_BASED_OUTPATIENT_CLINIC_OR_DEPARTMENT_OTHER): Payer: Self-pay

## 2024-02-07 ENCOUNTER — Other Ambulatory Visit (HOSPITAL_BASED_OUTPATIENT_CLINIC_OR_DEPARTMENT_OTHER): Payer: Self-pay

## 2024-02-10 ENCOUNTER — Ambulatory Visit: Admitting: Internal Medicine

## 2024-02-12 ENCOUNTER — Other Ambulatory Visit (HOSPITAL_BASED_OUTPATIENT_CLINIC_OR_DEPARTMENT_OTHER): Payer: Self-pay

## 2024-02-13 ENCOUNTER — Ambulatory Visit: Admitting: Internal Medicine

## 2024-02-14 ENCOUNTER — Other Ambulatory Visit (HOSPITAL_BASED_OUTPATIENT_CLINIC_OR_DEPARTMENT_OTHER): Payer: Self-pay

## 2024-02-18 ENCOUNTER — Other Ambulatory Visit: Payer: Self-pay | Admitting: Pharmacist

## 2024-02-25 MED ORDER — METHYLPREDNISOLONE ACETATE 40 MG/ML IJ SUSP: 40.0000 mg | Freq: Once | INTRAMUSCULAR | Status: AC

## 2024-02-25 NOTE — Addendum Note (Signed)
 Addended by: Junell Cullifer G on: 02/25/2024 08:37 PM   Modules accepted: Orders

## 2024-03-02 NOTE — Progress Notes (Signed)
 Subjective:    Patient ID: Stephen Anderson, male    DOB: May 19, 1940, 83 y.o.   MRN: [id number]  HPI male former smoker  followed for OSA complicated by peripheral neuropathy, essential tremor NPSG 11/2013  AHI 21/ hr, titrated to 8 cwp =====================================================================================================   06/10/23-  83 yo male former smoker followed for OSA, Insomnia,  complicated by peripheral neuropathy, essential tremor, GERD, BPH,  CPAP auto 5-15/[employer] -Caffeine 200 mg tab, primidone ,Lyrica   Download- compliance 97%, AHI 8.6/hr     pressure range 6.4-10.2     leak 44.4 up to 62.7 Body weight today-180 lbs Discussed the use of AI scribe software for clinical note transcription with the patient, who gave verbal consent to proceed. History of Present Illness   The patient, with a history of sleep apnea managed with CPAP, reports no issues with his CPAP machine. He receives regular supplies and adjusts the mask as needed for comfort and to prevent leaks. He notes that the machine uses more water in the winter, causing the bottom of the tank to become hot. I suggested adjusting the humidifier settings to prevent the machine from running dry.  The patient also reports regular exercise, including treadmill workouts at the gym twice a week, during which he achieves a heart rate of around 140 bpm. He is considering incorporating bicycle workouts into his routine.  In addition, the patient mentions a history of neuropathy, expressing hope for a future cure and concern about potential complications.    03/03/24- 83 yo male former smoker followed for OSA, Insomnia,  complicated by peripheral neuropathy, essential tremor, GERD, BPH,  CPAP auto 5-15/[employer] -Caffeine 200 mg tab, primidone ,Lyrica   Download- compliance 77%, AHI 4.1/hr Body weight today-180 lbs ------Sleeping well and using CPAP nightly.  Due for a new CPAP Discussed the use of AI scribe software for  clinical note transcription with the patient, who gave verbal consent to proceed.  History of Present Illness   Stephen Anderson is an 83 year old male who presents for CPAP management.  His 25-year-old CPAP was flagged by the home care company for replacement. He missed 6 to 7 days of use while out of town but otherwise uses it consistently and plans to bring it on future trips. With CPAP, he has good symptom control with about four apneas per hour. He is concerned about the cost of a replacement machine.  He uses a treadmill for exercise and feels this improves his breathing, reaching a heart rate up to 145 with activity.  He declines a flu shot today.   CXR 08/14/23 IMPRESSION: 1. Left lower lobe opacity consistent with pneumonia or atelectasis. 2. No other evidence of acute cardiopulmonary disease. 3. Emphysema.    Assessment and Plan:    Obstructive sleep apnea Well-controlled with CPAP therapy, approximately four breakthrough apneas per hour. CPAP machine requires replacement. Informed about potential copay. - Ordered replacement CPAP machine. - Advised to carry CPAP machine during travel. - Instructed to request follow-up with sleep specialist at front desk.      ROS-see HPI    + = positive Constitutional:    weight loss, night sweats, fevers, chills, fatigue, lassitude. HEENT:    headaches, difficulty swallowing, tooth/dental problems, sore throat,       sneezing, itching, ear ache, nasal congestion, post nasal drip, snoring CV:    chest pain, orthopnea, PND, swelling in lower extremities, anasarca,  dizziness, palpitations Resp:   shortness of breath with exertion or at rest.                productive cough,   non-productive cough, coughing up of blood.              change in color of mucus.  wheezing.   Skin:    rash or lesions. GI:  No-   heartburn, indigestion, abdominal pain, nausea, vomiting, diarrhea,                  change in bowel habits, loss of appetite GU: dysuria, change in color of urine, no urgency or frequency.   flank pain. MS:   +joint pain, stiffness, decreased range of motion, back pain. Neuro-    + symptoms of peripheral neuropathy both lower legs. Psych:  change in mood or affect.  depression or anxiety.   memory loss.    Objective:  OBJ- Physical Exam   General- Alert, Oriented, Affect-appropriate, Distress- none acute, fit-appearing/ not obese Skin- rash-none, lesions- none, excoriation- none Lymphadenopathy- none Head- atraumatic            Eyes- Gross vision intact, PERRLA, conjunctivae and secretions clear            Ears- +Hard of hearing            Nose- Clear, no-Septal dev, mucus, polyps, erosion, perforation             Throat- Mallampati II , mucosa clear , drainage- none, tonsils- atrophic Neck- flexible , trachea midline, no stridor , thyroid  nl, carotid no bruit Chest - symmetrical excursion , unlabored           Heart/CV- RRR , no murmur , no gallop  , no rub, nl s1 s2                           - JVD- none , edema- none, stasis changes- none, varices- none           Lung- clear to P&A, wheeze- none, cough- none , dullness-none, rub- none           Chest wall-  Abd-  Br/ Gen/ Rectal- Not done, not indicated Extrem- cyanosis- none, clubbing, none, atrophy- none, strength- nl Neuro- + tremor mouth  Assessment & Plan:  Assessment and Plan    Obstructive Sleep Apnea Patient is compliant with CPAP use and reports occasional mask leak. Machine settings are appropriate with a range of 5-15, spending most of the time between 6-10. Mask leak accounts for higher than optimal AHI. -Continue current CPAP settings. -Adjust mask as needed for comfort and to prevent leaks. -Consider adjusting humidifier settings if excessive water use continues.  General Health Maintenance Patient is engaging in regular exercise, primarily treadmill use, and considering incorporating bicycle  use. -Continue regular exercise regimen. -Consider alternating between treadmill and bicycle for variety.  Follow-up in 1 year unless issues arise with CPAP use or other health concerns.

## 2024-03-03 ENCOUNTER — Encounter: Payer: Self-pay | Admitting: Internal Medicine

## 2024-03-03 ENCOUNTER — Ambulatory Visit (INDEPENDENT_AMBULATORY_CARE_PROVIDER_SITE_OTHER): Admitting: Internal Medicine

## 2024-03-03 VITALS — BP 130/64 | HR 61 | Temp 97.5°F | Ht 70.0 in | Wt 180.4 lb

## 2024-03-03 DIAGNOSIS — Z87891 Personal history of nicotine dependence: Secondary | ICD-10-CM

## 2024-03-03 DIAGNOSIS — G4733 Obstructive sleep apnea (adult) (pediatric): Secondary | ICD-10-CM | POA: Diagnosis not present

## 2024-03-03 NOTE — Patient Instructions (Addendum)
 Order- DME [facility]   replace old CPAP machine auto 5-15, mask of choice, humidifier, supplies, AirView/ card  Please call if we can help  At check out, ask the front desk to bring you back with a sleep doctor

## 2024-03-04 ENCOUNTER — Ambulatory Visit: Admitting: Internal Medicine

## 2024-03-12 ENCOUNTER — Encounter: Payer: Self-pay | Admitting: Internal Medicine

## 2024-03-12 ENCOUNTER — Ambulatory Visit: Payer: Self-pay | Admitting: Internal Medicine

## 2024-03-12 ENCOUNTER — Other Ambulatory Visit (HOSPITAL_COMMUNITY): Payer: Self-pay

## 2024-03-12 ENCOUNTER — Other Ambulatory Visit (HOSPITAL_BASED_OUTPATIENT_CLINIC_OR_DEPARTMENT_OTHER): Payer: Self-pay

## 2024-03-12 ENCOUNTER — Other Ambulatory Visit: Payer: Self-pay

## 2024-03-12 ENCOUNTER — Ambulatory Visit: Admitting: Internal Medicine

## 2024-03-12 VITALS — BP 112/78 | HR 68 | Temp 98.0°F | Ht 70.0 in | Wt 176.4 lb

## 2024-03-12 DIAGNOSIS — E781 Pure hyperglyceridemia: Secondary | ICD-10-CM

## 2024-03-12 DIAGNOSIS — M170 Bilateral primary osteoarthritis of knee: Secondary | ICD-10-CM

## 2024-03-12 DIAGNOSIS — R413 Other amnesia: Secondary | ICD-10-CM

## 2024-03-12 DIAGNOSIS — G609 Hereditary and idiopathic neuropathy, unspecified: Secondary | ICD-10-CM | POA: Diagnosis not present

## 2024-03-12 DIAGNOSIS — I7300 Raynaud's syndrome without gangrene: Secondary | ICD-10-CM

## 2024-03-12 DIAGNOSIS — N1831 Chronic kidney disease, stage 3a: Secondary | ICD-10-CM | POA: Diagnosis not present

## 2024-03-12 DIAGNOSIS — E559 Vitamin D deficiency, unspecified: Secondary | ICD-10-CM

## 2024-03-12 DIAGNOSIS — E538 Deficiency of other specified B group vitamins: Secondary | ICD-10-CM

## 2024-03-12 DIAGNOSIS — G4733 Obstructive sleep apnea (adult) (pediatric): Secondary | ICD-10-CM

## 2024-03-12 LAB — LIPID PANEL
Cholesterol: 130 mg/dL (ref 0–200)
HDL: 47.5 mg/dL (ref >39.00)
LDL Cholesterol: 66 mg/dL (ref 0–99)
NonHDL: 82.09
Total CHOL/HDL Ratio: 3
Triglycerides: 78 mg/dL (ref 0.0–149.0)
VLDL: 15.6 mg/dL (ref 0.0–40.0)

## 2024-03-12 LAB — CBC WITH DIFFERENTIAL/PLATELET
Basophils Absolute: 0 K/uL (ref 0.0–0.1)
Basophils Relative: 0.8 % (ref 0.0–3.0)
Eosinophils Absolute: 0.1 K/uL (ref 0.0–0.7)
Eosinophils Relative: 2.3 % (ref 0.0–5.0)
HCT: 44.2 % (ref 39.0–52.0)
Hemoglobin: 15 g/dL (ref 13.0–17.0)
Lymphocytes Relative: 26.5 % (ref 12.0–46.0)
Lymphs Abs: 1.5 K/uL (ref 0.7–4.0)
MCHC: 33.9 g/dL (ref 30.0–36.0)
MCV: 90.9 fl (ref 78.0–100.0)
Monocytes Absolute: 0.5 K/uL (ref 0.1–1.0)
Monocytes Relative: 8.7 % (ref 3.0–12.0)
Neutro Abs: 3.6 K/uL (ref 1.4–7.7)
Neutrophils Relative %: 61.7 % (ref 43.0–77.0)
Platelets: 177 K/uL (ref 150.0–400.0)
RBC: 4.86 Mil/uL (ref 4.22–5.81)
RDW: 15 % (ref 11.5–15.5)
WBC: 5.8 K/uL (ref 4.0–10.5)

## 2024-03-12 LAB — URINALYSIS, ROUTINE W REFLEX MICROSCOPIC
Bilirubin Urine: NEGATIVE
Hgb urine dipstick: NEGATIVE
Ketones, ur: NEGATIVE
Leukocytes,Ua: NEGATIVE
Nitrite: NEGATIVE
RBC / HPF: NONE SEEN (ref 0–?)
Specific Gravity, Urine: 1.02 (ref 1.000–1.030)
Total Protein, Urine: NEGATIVE
Urine Glucose: NEGATIVE
Urobilinogen, UA: 0.2 (ref 0.0–1.0)
pH: 6 (ref 5.0–8.0)

## 2024-03-12 LAB — COMPREHENSIVE METABOLIC PANEL WITH GFR
ALT: 18 U/L (ref 0–53)
AST: 21 U/L (ref 0–37)
Albumin: 4.8 g/dL (ref 3.5–5.2)
Alkaline Phosphatase: 67 U/L (ref 39–117)
BUN: 23 mg/dL (ref 6–23)
CO2: 29 meq/L (ref 19–32)
Calcium: 9.2 mg/dL (ref 8.4–10.5)
Chloride: 103 meq/L (ref 96–112)
Creatinine, Ser: 1.03 mg/dL (ref 0.40–1.50)
GFR: 68.02 mL/min (ref >60.00)
Glucose, Bld: 104 mg/dL — ABNORMAL HIGH (ref 70–99)
Potassium: 4 meq/L (ref 3.5–5.1)
Sodium: 140 meq/L (ref 135–145)
Total Bilirubin: 0.6 mg/dL (ref 0.2–1.2)
Total Protein: 7.3 g/dL (ref 6.0–8.3)

## 2024-03-12 LAB — MAGNESIUM: Magnesium: 2 mg/dL (ref 1.5–2.5)

## 2024-03-12 LAB — MICROALBUMIN / CREATININE URINE RATIO
Creatinine,U: 91.9 mg/dL
Microalb Creat Ratio: 55.1 mg/g — ABNORMAL HIGH (ref 0.0–30.0)
Microalb, Ur: 5.1 mg/dL — ABNORMAL HIGH (ref 0.0–1.9)

## 2024-03-12 LAB — VITAMIN B12: Vitamin B-12: 916 pg/mL — ABNORMAL HIGH (ref 211–911)

## 2024-03-12 LAB — VITAMIN D 25 HYDROXY (VIT D DEFICIENCY, FRACTURES): VITD: 34.4 ng/mL (ref 30.00–100.00)

## 2024-03-12 LAB — URIC ACID: Uric Acid, Serum: 5.5 mg/dL (ref 4.0–7.8)

## 2024-03-12 MED ORDER — DULOXETINE HCL 30 MG PO CPEP
30.0000 mg | ORAL_CAPSULE | Freq: Every day | ORAL | 3 refills | Status: DC
  Filled 2024-03-12 (×2): qty 90, 90d supply, fill #0
  Filled 2024-04-13: qty 90, 90d supply, fill #1

## 2024-03-12 NOTE — Assessment & Plan Note (Signed)
 Will setup Annual Wellness Visit (AWV) for monitoring

## 2024-03-12 NOTE — Assessment & Plan Note (Signed)
 Shared decision-making done; patient understood rationale and agreed to [facility]

## 2024-03-12 NOTE — Assessment & Plan Note (Signed)
 Raynaud's phenomenon   Raynaud's phenomenon is well-controlled with nifedipine . No recent episodes of white fingertips. Continue nifedipine  and advise wearing gloves in cold weather to prevent exacerbation.

## 2024-03-12 NOTE — Assessment & Plan Note (Signed)
 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 Lyrica  is effective but causes drowsiness. Duloxetine is started as an alternative for its efficacy in nerve pain without sedative effects. Ropinirole  is discontinued due to adverse effects. Continue Lyrica  at the current dose and monitor for worsening restless legs syndrome after stopping ropinirole .

## 2024-03-12 NOTE — Assessment & Plan Note (Addendum)
 Obstructive sleep apnea is managed with CPAP. Recent follow-up with the CPAP provider was satisfactory. Continue CPAP therapy.

## 2024-03-12 NOTE — Patient Instructions (Addendum)
 It was a pleasure seeing you today! Your health and satisfaction are our top priorities.  Bernardino Cone, MD  VISIT SUMMARY: Today, we reviewed your ongoing nerve pain, arthritis, and other health concerns. We discussed your current medications and made some adjustments to better manage your symptoms.  YOUR PLAN: -IDIOPATHIC PERIPHERAL NEUROPATHY WITH NEUROPATHIC PAIN AND RESTLESS LEGS SYNDROME: This condition involves nerve pain and restless legs without a known cause. We are starting you on Duloxetine  to help manage the nerve pain without causing drowsiness. You should continue taking Lyrica  at the current dose and stop taking ropinirole . Monitor for any worsening of restless legs syndrome after stopping ropinirole .  -BILATERAL KNEE OSTEOARTHRITIS: This is a condition where the cartilage in your knee joints wears down, causing pain and stiffness. Continue using the topical diclofenac  and receiving knee injections as they have been effective for you.  -OBSTRUCTIVE SLEEP APNEA ON CPAP: This is a condition where your breathing stops and starts during sleep. Continue using your CPAP machine as your recent follow-up was satisfactory.  -CHRONIC KIDNEY DISEASE, STAGE 3A: This is a condition where your kidneys are not functioning at full capacity. We have ordered blood and urine tests to monitor your kidney function.  -RAYNAUD'S PHENOMENON: This is a condition where some areas of your body, like your fingers, feel numb and cold in response to cold temperatures or stress. Continue taking nifedipine  and wear gloves in cold weather to prevent symptoms.  -GENERAL HEALTH MAINTENANCE: We will monitor your prostate health and cholesterol levels. A cholesterol panel has been ordered, and we have scheduled your annual wellness visit within the next three months.  INSTRUCTIONS: Please follow up with the blood and urine tests to monitor your kidney function. Also, ensure you complete the cholesterol panel and  attend your annual wellness visit within the next three months.  Your Providers PCP: Cone Bernardino MATSU, MD,  [phone]) Referring Provider: Cone Bernardino MATSU, MD,  [phone]) Care Team Provider: Evonnie Asberry RAMAN, DO,  [phone]) Care Team Provider: Neysa Reggy BIRCH, MD,  [phone]) Care Team Provider: Joshua Barters Care Team Provider: Gerome Charleston, MD Care Team Provider: Donnald Charleston, MD,  [phone]) Care Team Provider: Leslee Reusing, MD,  [phone]) Care Team Provider: Leslee Reusing, MD,  [phone]) Care Team Provider: Livingston Rigg, MD,  [phone]) Care Team Provider: Porter Andrez SAUNDERS, PA-C Care Team Provider: Lonni Slain, MD,  [phone]) Care Team Provider: Dianna Specking, MD,  [phone]) Care Team Provider: Jeannetta Lonni ORN, MD,  [phone]) Care Team Provider: Karis Clunes, MD,  ([phone])  NEXT STEPS: [x]  Early Intervention: Schedule sooner appointment, call our on-call services, or go to emergency room if there is any significant Increase in pain or discomfort New or worsening symptoms Sudden or severe changes in your health [x]  Flexible Follow-Up: We recommend a Return in about 6 months (around 09/10/2024) for annual preventive care visit. for optimal routine care. This allows for progress monitoring and treatment adjustments. [x]  Preventive Care: Schedule your annual preventive care visit! It's typically covered by insurance and helps identify potential health issues early. [x]  Lab & X-ray Appointments: Incomplete tests scheduled today, or call to schedule. X-rays: [facility] (M-F, 8:30am-noon or 1pm-5pm). [x]  Medical Information Release: Sign a release form at front desk to obtain relevant medical information we don't have.  MAKING THE MOST OF OUR FOCUSED 20 MINUTE APPOINTMENTS: [x]   Clearly state your top concerns at the beginning of the visit to focus our discussion [x]   If you  anticipate you  will need more time, please inform the front desk during scheduling - we can book multiple appointments in the same week. [x]   If you have transportation problems- use our convenient video appointments or ask about transportation support. [x]   We can get down to business faster if you use MyChart to update information before the visit and submit non-urgent questions before your visit. Thank you for taking the time to provide details through MyChart.  Let our nurse know and she can import this information into your encounter documents.  Arrival and Wait Times: [x]   Arriving on time ensures that everyone receives prompt attention. [x]   Early morning (8a) and afternoon (1p) appointments tend to have shortest wait times. [x]   Unfortunately, we cannot delay appointments for late arrivals or hold slots during phone calls.  Getting Answers and Following Up [x]   Simple Questions & Concerns: For quick questions or basic follow-up after your visit, reach us  at [phone] or MyChart messaging. [x]   Complex Concerns: If your concern is more complex, scheduling an appointment might be best. Discuss this with the staff to find the most suitable option. [x]   Lab & Imaging Results: We'll contact you directly if results are abnormal or you don't use MyChart. Most normal results will be on MyChart within 2-3 business days, with a review message from Dr. Jesus. Haven't heard back in 2 weeks? Need results sooner? Contact us  at [phone]. [x]   Referrals: Our referral coordinator will manage specialist referrals. The specialist's office should contact you within 2 weeks to schedule an appointment. Call us  if you haven't heard from them after 2 weeks.  Staying Connected [x]   MyChart: Activate your MyChart for the fastest way to access results and message us . See the last page of this paperwork for instructions on how to activate.  Bring to Your Next Appointment [x]   Medications: Please bring  all your medication bottles to your next appointment to ensure we have an accurate record of your prescriptions. [x]   Health Diaries: If you're monitoring any health conditions at home, keeping a diary of your readings can be very helpful for discussions at your next appointment.  Billing [x]   X-ray & Lab Orders: These are billed by separate companies. Contact the invoicing company directly for questions or concerns. [x]   Visit Charges: Discuss any billing inquiries with our administrative services team.  Your Satisfaction Matters [x]   Share Your Experience: We strive for your satisfaction! If you have any complaints, or preferably compliments, please let Dr. Jesus know directly or contact our Practice Administrators, Manuelita Rubin or [employer], by asking at the front desk.   Reviewing Your Records [x]   Review this early draft of your clinical encounter notes below and the final encounter summary tomorrow on MyChart after its been completed.  All orders placed so far are visible here: Idiopathic peripheral neuropathy -     DULoxetine HCl; Take 1 capsule (30 mg total) by mouth daily.  Dispense: 90 capsule; Refill: 3 -     Vitamin B12 -     Methylmalonic acid, serum  Chronic kidney disease, stage 3a (HCC) -     Protein / creatinine ratio, urine -     Microalbumin / creatinine urine ratio -     Magnesium -     Urinalysis, Routine w reflex microscopic -     VITAMIN D  25 Hydroxy (Vit-D Deficiency, Fractures) -     PTH, intact and calcium  -     Uric acid -  Comprehensive metabolic panel with GFR -     CBC with Differential/Platelet  Vitamin D  deficiency -     VITAMIN D  25 Hydroxy (Vit-D Deficiency, Fractures)  Memory impairment -     Vitamin B12 -     Methylmalonic acid, serum  B12 deficiency -     Vitamin B12 -     Methylmalonic acid, serum  Hypertriglyceridemia -     Lipid panel  OSA on CPAP Assessment & Plan: Idiopathic peripheral neuropathy with neuropathic pain  and restless legs syndrome   Chronic neuropathic pain and restless legs syndrome are likely idiopathic, possibly related to past lead paint exposure. Lyrica  is effective but causes drowsiness. Duloxetine  is started as an alternative for its efficacy in nerve pain without sedative effects. Ropinirole  is discontinued due to adverse effects. Continue Lyrica  at the current dose and monitor for worsening restless legs syndrome after stopping ropinirole .   Knee arthritis, bilateral Assessment & Plan: Bilateral knee osteoarthritis   Chronic bilateral knee osteoarthritis with intermittent cramps is managed with topical diclofenac  and knee injections, which are effective. Continue current management.   Raynaud's disease without gangrene

## 2024-03-12 NOTE — Progress Notes (Signed)
 ==============================  [facility]: [phone]   -- Medical Office Visit --  Patient: Stephen Anderson      Age: 83 y.o.       Sex:  male  Date:   03/12/2024 Today's Healthcare Provider: Bernardino KANDICE Cone, MD  ==============================   Chief Complaint: Hyperlipidemia (States he is doing well getting up working out in yard,medication are going well,)  Discussed the use of AI scribe software for clinical note transcription with the patient, who gave verbal consent to proceed.  History of Present Illness 83 year old male who presents for management of nerve pain and medication review.  He experiences ongoing neuropathy and arthritis, describing them as the 'world's two worst things' he has. He has nerve pain in his legs, sometimes accompanied by cramps. He has been using Lyrica  150 mg twice a day, having reduced from 200 mg due to side effects, and takes it at bedtime to manage sleepiness. He also uses omega-3 fatty acids and alpha-lipoic acid for nerve pain, which he believes have been beneficial.  For arthritis, he uses Voltron cream and receives knee injections, which he finds effective. Cold weather exacerbates his symptoms, but he manages with gloves. He has Raynaud's phenomenon, which has improved with nifedipine , as he no longer experiences white fingertips.  He has a history of restless legs for which he was taking ropinirole . He uses a CPAP machine for sleep apnea and reports no issues with sleep, stating he 'just dies' when he sits down to rest.  He has a history of double hernia surgery and suspects the mesh might be causing issues, but he is not currently seeking further surgical intervention. He reports no current prostate issues but has not been seeing a specialist for monitoring.  He has a history of hearing issues, but reports that his hearing has recovered fully after a previous problem. He does not monitor his heart at  home and is not interested in company secretary.  He takes B12 supplements daily and has had his levels monitored in the past.  He is retired and spends time maintaining his yard. He does not use modern technology extensively, preferring to use his phone for calls only. No difficulty sleeping and no current issues with his hearing. Ground Reviewed: Problem List: has Essential tremor; Idiopathic peripheral neuropathy; OSA on CPAP; GERD (gastroesophageal reflux disease); Mixed hyperlipidemia; Osteoarthritis, multiple sites; History of melanoma; Insomnia; B12 deficiency; RLS (restless legs syndrome); Chronic fatigue; Knee arthritis, bilateral; Pain in joint of right shoulder; Chest discomfort; Former smoker; High risk medication use; Kidney cyst, acquired; Fatty liver; Thromboangiitis obliterans (Buerger's disease); RUQ abdominal pain; 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, not believe to be due to lupus or systemic sclerosis; Diverticular disease; Right inguinal hernia; Aortic atherosclerosis; Bronchiectasis (HCC); Recurrent kidney stones; Calcification of prostate; DDD (degenerative disc disease), lumbar; LVH (left ventricular hypertrophy); Grade I diastolic dysfunction; Weight loss; Gastritis; Hiatal hernia; Lumbar back pain; Arthritis of both hands; Trigger finger, right ring finger; Chronic kidney disease, stage 2 (mild); Prediabetes; 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 Past Surgical History:   has a past surgical history that includes Gallbladder surgery; Hernia repair; Cataract extraction, bilateral; and Melanoma excision. Social History:   reports that he quit smoking about 15 years ago. His smoking use included cigarettes. He started smoking about 65 years ago. He has a 50 pack-year smoking history. He has never been exposed to tobacco smoke. He quit smokeless tobacco use about 21 years ago. He reports that he does not drink alcohol and does not use drugs. Family History:  family history includes Bladder Cancer in his brother; Heart disease in his brother; Lung cancer in his father; Neuropathy in his daughter. Allergies:  is allergic to hydrocodone, oxycodone, oxycontin [oxycodone hcl], and simvastatin.   Medication Reconciliation: Current Outpatient Medications on File Prior to Visit  Medication Sig   Acetaminophen (TYLENOL ARTHRITIS EXT RELIEF PO) Take 1 tablet by mouth every other day.   Alpha-Lipoic Acid 300 MG TABS Take 2 tablets (600 mg total) by mouth daily at 6 (six) AM.   Ascorbic Acid (VITAMIN C PO) Take 500 mg by mouth daily.   aspirin 81 MG tablet Take 81 mg by mouth every other day.    atorvastatin  (LIPITOR) 40 MG tablet Take 1 tablet (40 mg total) by mouth at bedtime. add to pill packs if possible   celecoxib  (CELEBREX ) 100 MG capsule Take 1 capsule (100 mg total) by mouth 2 (two) times daily. Replaces 200 mg dose, trial step down to minimize  pills   Cholecalciferol  (VITAMIN D -3) 125 MCG (5000 UT) TABS Take 1 tablet by mouth daily.   cilostazol  (PLETAL ) 50 MG tablet Take 1 tablet (50 mg total) by mouth 2 (two) times daily.   Cyanocobalamin  (B-12 PO) Take 1 tablet by mouth daily.   diclofenac  Sodium (VOLTAREN ) 1 % GEL Apply 2 g topically in the morning and at bedtime.   fluticasone  (FLONASE ) 50 MCG/ACT nasal spray Place 1 spray into both nostrils daily as needed.   ketoconazole  (NIZORAL ) 2 % shampoo APPLY TO AFFECTED AREA(S) **LET SIT FOR 3 TO 5 MINUTES BEFORE RINSING**   lidocaine  (XYLOCAINE ) 5 % ointment Apply to the affected area(s) topically as needed.   NIFEdipine  (PROCARDIA -XL/NIFEDICAL-XL) 30 MG 24 hr tablet Take 1 tablet (30 mg total) by mouth daily.   NON FORMULARY CPAP   omega-3 acid ethyl esters (LOVAZA ) 1 g capsule Take 2 capsules (2 g total) by mouth 2 (two) times daily.   pantoprazole  (PROTONIX ) 20 MG tablet Take 1 tablet (20 mg total) by mouth daily. Replaces 40 mg dose, step down dose trial.   pregabalin  (LYRICA ) 150 MG capsule Take 1 capsule (150 mg total) by mouth 2 (two) times daily.   primidone  (MYSOLINE ) 50 MG tablet Take 1 tablet (50 mg total) by mouth 2 (two) times daily in morning and evening.   selenium  sulfide (SELSUN ) 2.5 % lotion Apply 1 Application topically daily as needed for irritation.   triamcinolone  cream (KENALOG ) 0.1 % Apply to the affected area(s) topically 2 (two) times daily. Use for up to 14 days.   Current Facility-Administered Medications on File Prior to Visit  Medication   methylPREDNISolone  acetate (DEPO-MEDROL ) injection 40 mg   Medications Discontinued During This Encounter  Medication Reason   rOPINIRole  (REQUIP ) 1 MG tablet     Physical Exam:    03/12/2024    7:49 AM 03/03/2024    9:53 AM 01/28/2024    7:48 AM  Vitals with BMI  Height 5' 10 5' 10 5' 10.5  Weight 176 lbs 6 oz 180 lbs 6 oz 177 lbs 10 oz  BMI 25.31 25.88 25.11  Systolic 112 130 869  Diastolic 78 64 60   Pulse 68 61 62  Vital signs reviewed.  Nursing notes reviewed. Weight trend reviewed. Physical Activity: Not on file   General Appearance:  No acute distress appreciable.   Well-groomed, healthy-appearing male.  Well proportioned with no abnormal fat distribution.  Good muscle tone. Pulmonary:  Normal work of breathing at rest, no respiratory distress apparent. SpO2: 98 %  Musculoskeletal: All extremities are intact.  Neurological:  Awake, alert, oriented, and engaged.  No obvious focal neurological deficits or cognitive impairments.  Sensorium seems unclouded.   Speech is clear and coherent with logical content. Psychiatric:  Appropriate mood, pleasant and cooperative demeanor, thoughtful and engaged during the exam     05/17/2023    8:48 AM 05/01/2023    8:35 AM 03/27/2023    8:50 AM 01/16/2023    8:45 AM  PHQ 2/9 Scores  PHQ - 2 Score 0 0 0 1  PHQ- 9 Score  2  1  4       Data saved with a previous flowsheet row definition    {   No results found for any visits on 03/12/24.} Office Visit on 10/07/2023  Component Date Value Ref Range Status   WBC 10/07/2023 8.0  4.0 - 10.5 K/uL Final   RBC 10/07/2023 4.90  4.22 - 5.81 Mil/uL Final   Hemoglobin 10/07/2023 14.8  13.0 - 17.0 g/dL Final   HCT 93/69/7974 44.0  39.0 - 52.0 % Final   MCV 10/07/2023 89.9  78.0 - 100.0 fl Final   MCHC 10/07/2023 33.6  30.0 - 36.0 g/dL Final   RDW 93/69/7974 14.1  11.5 - 15.5 % Final   Platelets 10/07/2023 227.0  150.0 - 400.0 K/uL Final   Neutrophils Relative % 10/07/2023 58.8  43.0 - 77.0 % Final   Lymphocytes Relative 10/07/2023 27.5  12.0 - 46.0 % Final   Monocytes Relative 10/07/2023 10.4  3.0 - 12.0 % Final   Eosinophils Relative 10/07/2023 2.2  0.0 - 5.0 % Final   Basophils Relative 10/07/2023 1.1  0.0 - 3.0 % Final   Neutro Abs 10/07/2023 4.7  1.4 - 7.7 K/uL Final   Lymphs Abs 10/07/2023 2.2  0.7 - 4.0 K/uL Final   Monocytes Absolute 10/07/2023 0.8  0.1 - 1.0 K/uL Final   Eosinophils Absolute  10/07/2023 0.2  0.0 - 0.7 K/uL Final   Basophils Absolute 10/07/2023 0.1  0.0 - 0.1 K/uL Final   Sodium 10/07/2023 142  135 - 145 mEq/L Final   Potassium 10/07/2023 4.1  3.5 - 5.1 mEq/L Final   Chloride 10/07/2023 104  96 - 112 mEq/L Final   CO2 10/07/2023 26  19 - 32 mEq/L Final   Glucose, Bld 10/07/2023 94  70 - 99 mg/dL Final   BUN 93/69/7974 31 (H)  6 - 23 mg/dL Final   Creatinine, Ser 10/07/2023 1.49  0.40 - 1.50 mg/dL Final   Total Bilirubin 10/07/2023 0.5  0.2 - 1.2 mg/dL Final   Alkaline Phosphatase 10/07/2023 82  39 - 117 U/L Final   AST 10/07/2023 26  0 - 37 U/L Final   ALT 10/07/2023 40  0 - 53 U/L Final   Total Protein 10/07/2023 7.1  6.0 - 8.3 g/dL Final   Albumin 93/69/7974 4.6  3.5 - 5.2 g/dL Final   GFR 93/69/7974 43.80 (L)  >60.00 mL/min Final   Calcium  10/07/2023 9.4  8.4 - 10.5 mg/dL Final   Sed Rate 93/69/7974 7  0 - 20 mm/hr Final   Pro B Natriuretic peptide (BNP) 10/07/2023 12.0  0.0 - 100.0 pg/mL Final   Vitamin B-12 10/07/2023 1,138 (H)  211 - 911 pg/mL Final   Folate 10/07/2023 >23.2  >5.9 ng/mL Final   Rheumatoid fact SerPl-aCnc 10/07/2023 <10  <14 IU/mL Final   Anti Nuclear Antibody (ANA) 10/07/2023 POSITIVE (A)  NEGATIVE Final   ANA Titer 1 10/07/2023 1:40 (H)  titer Final   ANA Pattern 1 10/07/2023 Nuclear, Homogeneous (A)   Final  Office Visit on 08/21/2023  Component Date Value Ref Range Status   MICRO NUMBER: 08/21/2023 [id number]   Final   SPECIMEN QUALITY: 08/21/2023 Adequate   Final   Source: 08/21/2023 NOT GIVEN   Final   STATUS: 08/21/2023 FINAL   Final   GRAM STAIN: 08/21/2023 No epithelial cells seen No white blood cells seen No organisms seen   Final   ANA RESULT: 08/21/2023 No anaerobes isolated.   Final   MICRO NUMBER: 08/21/2023 [id number]   Final   SPECIMEN QUALITY: 08/21/2023 Adequate   Final   SOURCE: 08/21/2023 NOT GIVEN   Final   STATUS: 08/21/2023 FINAL   Final   AER RESULT: 08/21/2023 No Growth   Final   COMMENT: 08/21/2023 No source  was provided. The specimen was tested and reported based upon the test code ordered. If this is incorrect, please contact client services.   Final  Office Visit on 08/14/2023  Component Date Value Ref Range Status   WBC 08/14/2023 11.4 (H)  4.0 - 10.5 K/uL Final   RBC 08/14/2023 4.23  4.22 - 5.81 Mil/uL Final   Hemoglobin 08/14/2023 13.0  13.0 - 17.0 g/dL Final   HCT 94/92/7974 38.3 (L)  39.0 - 52.0 % Final   MCV 08/14/2023 90.5  78.0 - 100.0 fl Final   MCHC 08/14/2023 33.9  30.0 - 36.0 g/dL Final   RDW 94/92/7974 13.9  11.5 - 15.5 % Final   Platelets 08/14/2023 228.0  150.0 - 400.0 K/uL Final   Neutrophils Relative % 08/14/2023 79.4 (H)  43.0 - 77.0 % Final   Lymphocytes Relative 08/14/2023 13.4  12.0 - 46.0 % Final   Monocytes Relative 08/14/2023 6.2  3.0 - 12.0 % Final   Eosinophils Relative 08/14/2023 0.2  0.0 - 5.0 % Final   Basophils Relative 08/14/2023 0.8  0.0 - 3.0 % Final   Neutro Abs 08/14/2023 9.0 (H)  1.4 - 7.7 K/uL Final   Lymphs Abs 08/14/2023 1.5  0.7 - 4.0 K/uL Final   Monocytes Absolute 08/14/2023 0.7  0.1 - 1.0 K/uL Final   Eosinophils Absolute 08/14/2023 0.0  0.0 - 0.7 K/uL Final   Basophils Absolute 08/14/2023 0.1  0.0 - 0.1 K/uL Final   Sodium 08/14/2023 137  135 - 145 mEq/L Final   Potassium 08/14/2023 4.8  3.5 - 5.1 mEq/L Final   Chloride 08/14/2023 105  96 - 112 mEq/L Final   CO2 08/14/2023 24  19 - 32 mEq/L Final   Glucose, Bld 08/14/2023 119 (H)  70 - 99 mg/dL Final   BUN 94/92/7974 27 (H)  6 -  23 mg/dL Final   Creatinine, Ser 08/14/2023 1.11  0.40 - 1.50 mg/dL Final   Total Bilirubin 08/14/2023 0.2  0.2 - 1.2 mg/dL Final   Alkaline Phosphatase 08/14/2023 74  39 - 117 U/L Final   AST 08/14/2023 28  0 - 37 U/L Final   ALT 08/14/2023 38  0 - 53 U/L Final   Total Protein 08/14/2023 7.0  6.0 - 8.3 g/dL Final   Albumin 94/92/7974 3.7  3.5 - 5.2 g/dL Final   GFR 94/92/7974 62.43  >60.00 mL/min Final   Calcium  08/14/2023 8.7  8.4 - 10.5 mg/dL Final   Pro B  Natriuretic peptide (BNP) 08/14/2023 24.0  0.0 - 100.0 pg/mL Final   CRP 08/14/2023 4.0  0.5 - 20.0 mg/dL Final   Sed Rate 94/92/7974 44 (H)  0 - 20 mm/hr Final   Vitamin B-12 08/14/2023 634  211 - 911 pg/mL Final   Folate 08/14/2023 13.1  >5.9 ng/mL Final   SARS Coronavirus 2 Ag 08/14/2023 Negative  Negative Final  Office Visit on 07/31/2023  Component Date Value Ref Range Status   WBC 07/31/2023 7.0  4.0 - 10.5 K/uL Final   RBC 07/31/2023 4.75  4.22 - 5.81 Mil/uL Final   Hemoglobin 07/31/2023 14.5  13.0 - 17.0 g/dL Final   HCT 95/76/7974 43.5  39.0 - 52.0 % Final   MCV 07/31/2023 91.7  78.0 - 100.0 fl Final   MCHC 07/31/2023 33.3  30.0 - 36.0 g/dL Final   RDW 95/76/7974 14.0  11.5 - 15.5 % Final   Platelets 07/31/2023 167.0  150.0 - 400.0 K/uL Final   Neutrophils Relative % 07/31/2023 64.7  43.0 - 77.0 % Final   Lymphocytes Relative 07/31/2023 24.9  12.0 - 46.0 % Final   Monocytes Relative 07/31/2023 7.5  3.0 - 12.0 % Final   Eosinophils Relative 07/31/2023 2.3  0.0 - 5.0 % Final   Basophils Relative 07/31/2023 0.6  0.0 - 3.0 % Final   Neutro Abs 07/31/2023 4.5  1.4 - 7.7 K/uL Final   Lymphs Abs 07/31/2023 1.7  0.7 - 4.0 K/uL Final   Monocytes Absolute 07/31/2023 0.5  0.1 - 1.0 K/uL Final   Eosinophils Absolute 07/31/2023 0.2  0.0 - 0.7 K/uL Final   Basophils Absolute 07/31/2023 0.0  0.0 - 0.1 K/uL Final   Sodium 07/31/2023 138  135 - 145 mEq/L Final   Potassium 07/31/2023 4.2  3.5 - 5.1 mEq/L Final   Chloride 07/31/2023 104  96 - 112 mEq/L Final   CO2 07/31/2023 28  19 - 32 mEq/L Final   Glucose, Bld 07/31/2023 101 (H)  70 - 99 mg/dL Final   BUN 95/76/7974 27 (H)  6 - 23 mg/dL Final   Creatinine, Ser 07/31/2023 1.18  0.40 - 1.50 mg/dL Final   Total Bilirubin 07/31/2023 0.6  0.2 - 1.2 mg/dL Final   Alkaline Phosphatase 07/31/2023 59  39 - 117 U/L Final   AST 07/31/2023 16  0 - 37 U/L Final   ALT 07/31/2023 14  0 - 53 U/L Final   Total Protein 07/31/2023 6.7  6.0 - 8.3 g/dL  Final   Albumin 95/76/7974 4.4  3.5 - 5.2 g/dL Final   GFR 95/76/7974 58.03 (L)  >60.00 mL/min Final   Calcium  07/31/2023 8.8  8.4 - 10.5 mg/dL Final   Phosphorus 95/76/7974 2.6  2.3 - 4.6 mg/dL Final   VITD 95/76/7974 47.95  30.00 - 100.00 ng/mL Final   PTH 07/31/2023 49  16 - 77  pg/mL Final   Uric Acid, Serum 07/31/2023 5.8  4.0 - 7.8 mg/dL Final   Color, Urine 95/76/7974 YELLOW  YELLOW Final   APPearance 07/31/2023 CLEAR  CLEAR Final   Specific Gravity, Urine 07/31/2023 1.019  1.001 - 1.035 Final   pH 07/31/2023 6.0  5.0 - 8.0 Final   Glucose, UA 07/31/2023 NEGATIVE  NEGATIVE Final   Bilirubin Urine 07/31/2023 NEGATIVE  NEGATIVE Final   Ketones, ur 07/31/2023 NEGATIVE  NEGATIVE Final   Hgb urine dipstick 07/31/2023 NEGATIVE  NEGATIVE Final   Protein, ur 07/31/2023 NEGATIVE  NEGATIVE Final   Nitrites, Initial 07/31/2023 NEGATIVE  NEGATIVE Final   Leukocyte Esterase 07/31/2023 NEGATIVE  NEGATIVE Final   WBC, UA 07/31/2023 NONE SEEN  0 - 5 /HPF Final   RBC / HPF 07/31/2023 NONE SEEN  0 - 2 /HPF Final   Squamous Epithelial / HPF 07/31/2023 NONE SEEN  < OR = 5 /HPF Final   Bacteria, UA 07/31/2023 NONE SEEN  NONE SEEN /HPF Final   Hyaline Cast 07/31/2023 NONE SEEN  NONE SEEN /LPF Final   Note 07/31/2023    Final   Vitamin B-12 07/31/2023 483  211 - 911 pg/mL Final   Methylmalonic Acid, Quant 07/31/2023 106  85 - 423 nmol/L Final   Ceruloplasmin 07/31/2023 20  14 - 30 mg/dL Final   Zinc  07/31/2023 60  60 - 130 mcg/dL Final   Total Protein 95/76/7974 6.4  6.1 - 8.1 g/dL Final   Albumin ELP 95/76/7974 4.1  3.8 - 4.8 g/dL Final   Alpha 1 95/76/7974 0.2  0.2 - 0.3 g/dL Final   Alpha 2 95/76/7974 0.7  0.5 - 0.9 g/dL Final   Beta Globulin 95/76/7974 0.4  0.4 - 0.6 g/dL Final   Beta 2 95/76/7974 0.3  0.2 - 0.5 g/dL Final   Gamma Globulin 07/31/2023 0.7 (L)  0.8 - 1.7 g/dL Final   Abnormal Protein Band1 07/31/2023   NONE DETECTED g/dL Final   SPE Interp. 95/76/7974    Final   Protein, Ur  08/02/2023 9.3  Not Estab. mg/dL Final   Protein, 75Y Urine 08/02/2023 Comment  30 - 150 mg/24 hr Final   ALBUMIN, U 08/02/2023 100.0  % Final   ALPHA 1 URINE 08/02/2023 0.0  % Final   ALPHA-2-GLOBULIN, U 08/02/2023 0.0  % Final   % BETA, Urine 08/02/2023 0.0  % Final   GAMMA GLOBULIN URINE 08/02/2023 0.0  % Final   M-SPIKE, % 08/02/2023 Not Observed  Not Observed % Final   M-Spike, mg/24 hr 08/02/2023 Comment  Not Observed mg/24 hr Final   Immunofixation, Urine 08/02/2023 Comment   Final   NOTE: 08/02/2023 Comment   Final   Free Kappa Lt Chains,Ur 08/02/2023 45.86  1.17 - 86.46 mg/L Final   Free Lambda Lt Chains,Ur 08/02/2023 8.15  0.27 - 15.21 mg/L Final   Kappa/Lambda Ratio,U 08/02/2023 5.63  1.83 - 14.26 Final   ANCA SCREEN 07/31/2023 Negative  Negative Final   Sed Rate 07/31/2023 4  0 - 20 mm/hr Final   CRP 07/31/2023 <1.0  0.5 - 20.0 mg/dL Final   Reflexve Urine Culture 07/31/2023    Final  Office Visit on 03/27/2023  Component Date Value Ref Range Status   Vitamin B-12 03/27/2023 1,492 (H)  211 - 911 pg/mL Final   Folate 03/27/2023 16.0  >5.9 ng/mL Final   Hgb A1c MFr Bld 03/27/2023 5.9  4.6 - 6.5 % Final   Ferritin 03/27/2023 53.8  22.0 - 322.0  ng/mL Final   Sodium 03/27/2023 142  135 - 145 mEq/L Final   Potassium 03/27/2023 4.3  3.5 - 5.1 mEq/L Final   Chloride 03/27/2023 105  96 - 112 mEq/L Final   CO2 03/27/2023 30  19 - 32 mEq/L Final   Glucose, Bld 03/27/2023 109 (H)  70 - 99 mg/dL Final   BUN 87/81/7975 21  6 - 23 mg/dL Final   Creatinine, Ser 03/27/2023 1.03  0.40 - 1.50 mg/dL Final   Total Bilirubin 03/27/2023 0.5  0.2 - 1.2 mg/dL Final   Alkaline Phosphatase 03/27/2023 68  39 - 117 U/L Final   AST 03/27/2023 17  0 - 37 U/L Final   ALT 03/27/2023 14  0 - 53 U/L Final   Total Protein 03/27/2023 6.8  6.0 - 8.3 g/dL Final   Albumin 87/81/7975 4.5  3.5 - 5.2 g/dL Final   GFR 87/81/7975 68.48  >60.00 mL/min Final   Calcium  03/27/2023 8.9  8.4 - 10.5 mg/dL Final   WBC  87/81/7975 6.3  4.0 - 10.5 K/uL Final   RBC 03/27/2023 4.74  4.22 - 5.81 Mil/uL Final   Hemoglobin 03/27/2023 14.6  13.0 - 17.0 g/dL Final   HCT 87/81/7975 43.8  39.0 - 52.0 % Final   MCV 03/27/2023 92.3  78.0 - 100.0 fl Final   MCHC 03/27/2023 33.3  30.0 - 36.0 g/dL Final   RDW 87/81/7975 14.0  11.5 - 15.5 % Final   Platelets 03/27/2023 185.0  150.0 - 400.0 K/uL Final   Neutrophils Relative % 03/27/2023 60.8  43.0 - 77.0 % Final   Lymphocytes Relative 03/27/2023 27.2  12.0 - 46.0 % Final   Monocytes Relative 03/27/2023 9.0  3.0 - 12.0 % Final   Eosinophils Relative 03/27/2023 2.3  0.0 - 5.0 % Final   Basophils Relative 03/27/2023 0.7  0.0 - 3.0 % Final   Neutro Abs 03/27/2023 3.8  1.4 - 7.7 K/uL Final   Lymphs Abs 03/27/2023 1.7  0.7 - 4.0 K/uL Final   Monocytes Absolute 03/27/2023 0.6  0.1 - 1.0 K/uL Final   Eosinophils Absolute 03/27/2023 0.1  0.0 - 0.7 K/uL Final   Basophils Absolute 03/27/2023 0.0  0.0 - 0.1 K/uL Final   Cholesterol 03/27/2023 123  0 - 200 mg/dL Final   Triglycerides 87/81/7975 81.0  0.0 - 149.0 mg/dL Final   HDL 87/81/7975 41.00  >39.00 mg/dL Final   VLDL 87/81/7975 16.2  0.0 - 40.0 mg/dL Final   LDL Cholesterol 03/27/2023 65  0 - 99 mg/dL Final   Total CHOL/HDL Ratio 03/27/2023 3   Final   NonHDL 03/27/2023 81.51   Final   VITD 03/27/2023 34.36  30.00 - 100.00 ng/mL Final   Anti Nuclear Antibody (ANA) 03/27/2023 Negative  Negative Final   Rheumatoid fact SerPl-aCnc 03/27/2023 <10  <14 IU/mL Final   T3 Uptake 03/27/2023 28  22 - 35 % Final   T4, Total 03/27/2023 7.5  4.9 - 10.5 mcg/dL Final   Free Thyroxine Index 03/27/2023 2.1  1.4 - 3.8 Final   TSH 03/27/2023 1.05  0.40 - 4.50 mIU/L Final   Arsenic, 24H Ur 04/04/2023 <10  <=80 mcg/L Final   Lead, 24 hr urine 04/04/2023 <10  <80 mcg/L Final   Mercury, 24H Ur 04/04/2023 <4  <=20 mcg/L Final  Office Visit on 09/24/2022  Component Date Value Ref Range Status   Rheumatoid fact SerPl-aCnc 09/24/2022 <10  <14  IU/mL Final   Sed Rate 09/24/2022 2  0 -  20 mm/h Final   Scleroderma (Scl-70) (ENA) Antibod* 09/24/2022 <1.0 NEG  <1.0 NEG AI Final   Ribonucleic Protein(ENA) Antibody,* 09/24/2022 <1.0 NEG  <1.0 NEG AI Final   ENA SM Ab Ser-aCnc 09/24/2022 <1.0 NEG  <1.0 NEG AI Final   SSA (Ro) (ENA) Antibody, IgG 09/24/2022 <1.0 NEG  <1.0 NEG AI Final   ds DNA Ab 09/24/2022 1  IU/mL Final   C3 Complement 09/24/2022 124  82 - 185 mg/dL Final   C4 Complement 93/82/7975 21  15 - 53 mg/dL Final  Office Visit on 09/05/2022  Component Date Value Ref Range Status   WBC 09/05/2022 6.9  4.0 - 10.5 K/uL Final   RBC 09/05/2022 4.71  4.22 - 5.81 Mil/uL Final   Hemoglobin 09/05/2022 14.1  13.0 - 17.0 g/dL Final   HCT 94/70/7975 42.4  39.0 - 52.0 % Final   MCV 09/05/2022 90.2  78.0 - 100.0 fl Final   MCHC 09/05/2022 33.2  30.0 - 36.0 g/dL Final   RDW 94/70/7975 14.9  11.5 - 15.5 % Final   Platelets 09/05/2022 158.0  150.0 - 400.0 K/uL Final   Neutrophils Relative % 09/05/2022 62.2  43.0 - 77.0 % Final   Lymphocytes Relative 09/05/2022 25.1  12.0 - 46.0 % Final   Monocytes Relative 09/05/2022 9.9  3.0 - 12.0 % Final   Eosinophils Relative 09/05/2022 2.2  0.0 - 5.0 % Final   Basophils Relative 09/05/2022 0.6  0.0 - 3.0 % Final   Neutro Abs 09/05/2022 4.3  1.4 - 7.7 K/uL Final   Lymphs Abs 09/05/2022 1.7  0.7 - 4.0 K/uL Final   Monocytes Absolute 09/05/2022 0.7  0.1 - 1.0 K/uL Final   Eosinophils Absolute 09/05/2022 0.2  0.0 - 0.7 K/uL Final   Basophils Absolute 09/05/2022 0.0  0.0 - 0.1 K/uL Final   Ferritin 09/05/2022 29.4  22.0 - 322.0 ng/mL Final   Cholesterol 09/05/2022 123  0 - 200 mg/dL Final   Triglycerides 94/70/7975 160.0 (H)  0.0 - 149.0 mg/dL Final   HDL 94/70/7975 34.60 (L)  >60.99 mg/dL Final   VLDL 94/70/7975 32.0  0.0 - 40.0 mg/dL Final   LDL Cholesterol 09/05/2022 57  0 - 99 mg/dL Final   Total CHOL/HDL Ratio 09/05/2022 4   Final   NonHDL 09/05/2022 88.83   Final   Sodium 09/05/2022 142  135 -  145 mEq/L Final   Potassium 09/05/2022 4.4  3.5 - 5.1 mEq/L Final   Chloride 09/05/2022 107  96 - 112 mEq/L Final   CO2 09/05/2022 28  19 - 32 mEq/L Final   Glucose, Bld 09/05/2022 101 (H)  70 - 99 mg/dL Final   BUN 94/70/7975 19  6 - 23 mg/dL Final   Creatinine, Ser 09/05/2022 1.17  0.40 - 1.50 mg/dL Final   Total Bilirubin 09/05/2022 0.4  0.2 - 1.2 mg/dL Final   Alkaline Phosphatase 09/05/2022 73  39 - 117 U/L Final   AST 09/05/2022 18  0 - 37 U/L Final   ALT 09/05/2022 15  0 - 53 U/L Final   Total Protein 09/05/2022 6.5  6.0 - 8.3 g/dL Final   Albumin 94/70/7975 4.0  3.5 - 5.2 g/dL Final   GFR 94/70/7975 59.00 (L)  >60.00 mL/min Final   Calcium  09/05/2022 8.6  8.4 - 10.5 mg/dL Final   VITD 94/70/7975 46.57  30.00 - 100.00 ng/mL Final   Magnesium 09/05/2022 1.9  1.5 - 2.5 mg/dL Final   Phosphorus 94/70/7975 2.1 (L)  2.3 -  4.6 mg/dL Final   Uric Acid, Serum 09/05/2022 5.7  4.0 - 7.8 mg/dL Final  Scanned Document on 06/20/2022  Component Date Value Ref Range Status   [facility] Colonoscopy 06/20/2022 See Report (in chart)  See Report (in chart), Patient Reported Final  Appointment on 06/04/2022  Component Date Value Ref Range Status   Weight 06/04/2022 2,848  oz Final   Height 06/04/2022 70  in Final   BP 06/04/2022 122/70  mmHg Final   S' Lateral 06/04/2022 2.15  cm Final   Area-P 1/2 06/04/2022 3.77  cm2 Final   MV M vel 06/04/2022 3.23  m/s Final   MV Peak grad 06/04/2022 41.6  mmHg Final   Est EF 06/04/2022 55 - 60%   Final  Hospital Outpatient Visit on 05/20/2022  Component Date Value Ref Range Status   Creatinine, Ser 05/20/2022 1.30 (H)  0.61 - 1.24 mg/dL Final  There may be more visits with results that are not included.  No image results found. No results found.       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 Lyrica  is effective but causes drowsiness. Duloxetine is started as an alternative for its efficacy in nerve pain without sedative effects. Ropinirole  is discontinued due to adverse effects. Continue Lyrica  at the current dose and monitor for worsening restless legs syndrome after stopping ropinirole . Chronic kidney disease, stage 3a (HCC) Chronic kidney disease, stage 3a   Chronic kidney disease stage 3a shows a slight decline in kidney function. Blood and urine tests are ordered to monitor kidney function. Vitamin D  deficiency B12 deficiency Hypertriglyceridemia Shared decision-making done; patient understood rationale and agreed to [facility]  Memory impairment Will setup Annual Wellness Visit (AWV) for monitoring  OSA on CPAP Obstructive sleep apnea is managed with CPAP. Recent follow-up with the CPAP provider was satisfactory. Continue CPAP therapy. Knee arthritis, bilateral Bilateral knee osteoarthritis   Chronic bilateral knee osteoarthritis with intermittent cramps is managed with topical diclofenac  and knee injections, which are effective. Continue current management. Raynaud's disease without gangrene Raynaud's phenomenon   Raynaud's phenomenon is well-controlled with nifedipine . No recent episodes of white fingertips. Continue nifedipine  and advise wearing gloves in cold weather to prevent exacerbation.  General Health Maintenance   Routine health maintenance includes monitoring prostate health and cholesterol levels. A cholesterol panel is ordered, and an annual wellness visit is scheduled within the next three months.  ORDER ASSOCIATIONS  #   DIAGNOSIS / CONDITION ICD-10 ENCOUNTER ORDER     ICD-10-CM   1. Idiopathic peripheral neuropathy  G60.9 DULoxetine (CYMBALTA) 30 MG capsule    B12    Methylmalonic acid, serum    2. Chronic kidney disease, stage 3a (HCC)  N18.31 Protein / creatinine ratio, urine     Microalbumin / creatinine urine ratio    Magnesium    Urinalysis, Routine w reflex microscopic    VITAMIN D  25 Hydroxy (Vit-D Deficiency, Fractures)    PTH, intact and calcium     Uric acid    Comp Met (CMET)    CBC with Differential/Platelet    3. Vitamin D  deficiency  E55.9 VITAMIN D  25 Hydroxy (Vit-D Deficiency, Fractures)    4. Memory impairment  R41.3 B12    Methylmalonic acid, serum    5. B12 deficiency  E53.8 B12  Methylmalonic acid, serum    6. Hypertriglyceridemia  E78.1 Lipid panel    7. OSA on CPAP  G47.33     8. Knee arthritis, bilateral  M17.0            Orders Placed in Encounter:   Lab Orders         Protein / creatinine ratio, urine         Microalbumin / creatinine urine ratio         Magnesium         Urinalysis, Routine w reflex microscopic         VITAMIN D  25 Hydroxy (Vit-D Deficiency, Fractures)         PTH, intact and calcium          Uric acid         Comp Met (CMET)         CBC with Differential/Platelet         B12         Methylmalonic acid, serum         Lipid panel    Meds ordered this encounter  Medications   DULoxetine (CYMBALTA) 30 MG capsule    Sig: Take 1 capsule (30 mg total) by mouth daily.    Dispense:  90 capsule    Refill:  3      This document was synthesized by artificial intelligence ([employer]) using HIPAA-compliant recording of the clinical interaction;   We discussed the use of AI scribe software for clinical note transcription with the patient, who gave verbal consent to proceed. additional Info: This encounter employed state-of-the-art, real-time, collaborative documentation. The patient actively reviewed and assisted in updating their electronic medical record on a shared screen, ensuring transparency and facilitating joint problem-solving for the problem list, overview, and plan. This approach promotes accurate, informed care. The treatment plan was discussed and reviewed in detail, including medication safety, potential  side effects, and all patient questions. We confirmed understanding and comfort with the plan. Follow-up instructions were established, including contacting the office for any concerns, returning if symptoms worsen, persist, or new symptoms develop, and precautions for potential emergency department visits.

## 2024-03-12 NOTE — Assessment & Plan Note (Signed)
 Bilateral knee osteoarthritis   Chronic bilateral knee osteoarthritis with intermittent cramps is managed with topical diclofenac  and knee injections, which are effective. Continue current management.

## 2024-03-13 ENCOUNTER — Other Ambulatory Visit: Payer: Self-pay

## 2024-03-18 ENCOUNTER — Other Ambulatory Visit (HOSPITAL_BASED_OUTPATIENT_CLINIC_OR_DEPARTMENT_OTHER): Payer: Self-pay

## 2024-03-18 LAB — PROTEIN / CREATININE RATIO, URINE
Creatinine, Urine: 93 mg/dL (ref 20–320)
Protein/Creat Ratio: 161 mg/g{creat} — ABNORMAL HIGH (ref 25–148)
Protein/Creatinine Ratio: 0.161 mg/mg{creat} — ABNORMAL HIGH (ref 0.025–0.148)
Total Protein, Urine: 15 mg/dL (ref 5–25)

## 2024-03-18 LAB — PTH, INTACT AND CALCIUM
Calcium: 9.6 mg/dL (ref 8.6–10.3)
PTH: 38 pg/mL (ref 16–77)

## 2024-03-18 LAB — METHYLMALONIC ACID, SERUM: Methylmalonic Acid, Quant: 133 nmol/L (ref 85–423)

## 2024-03-20 ENCOUNTER — Telehealth: Payer: Self-pay | Admitting: Internal Medicine

## 2024-03-20 NOTE — Telephone Encounter (Signed)
 [email or web address] unable to leave voice mail

## 2024-03-20 NOTE — Telephone Encounter (Signed)
 Copied from CRM #[id number]. Topic: General - Other >> Mar 20, 2024 11:54 AM Stephen Anderson wrote: Reason for CRM: patient is calling because dr young was suppose to send a prescription for a sleep machine . Patient says the people are saying they have not received anything . Will relay the information I see from the referral sent to [facility] . Please reach out to patient concerning this information [phone]

## 2024-03-24 NOTE — Telephone Encounter (Signed)
 Left a message to [facility], to know pts order status. Awaiting response  Informed patient I will call back,once I hear from them

## 2024-03-25 NOTE — Telephone Encounter (Signed)
 I have reached out to [facility], they said  patient needs an apt to get a new machine. Spoke to patient's wife Janie and relayed message; patient said she  has not made an apt with them before and don't know why they are needing one.  Brad from [employer] said he will contact his team to maybe speed up process and would contact patient to discuss process in detail. He also said getting a  new machine usually takes up  2-3 week. Called to relay message to pt-No answer, will call back

## 2024-03-25 NOTE — Telephone Encounter (Signed)
 Copied from CRM #[id number]. Topic: General - Other >> Mar 25, 2024  1:04 PM Whitney O wrote: Reason for CRM: patient wife corky returning call to christi . Please give them a call back . Tried calling clinic access line but no response . Phones disconnected tried calling back but no answer .  [phone]

## 2024-03-26 ENCOUNTER — Ambulatory Visit: Admitting: Internal Medicine

## 2024-03-26 ENCOUNTER — Other Ambulatory Visit (HOSPITAL_BASED_OUTPATIENT_CLINIC_OR_DEPARTMENT_OTHER): Payer: Self-pay

## 2024-03-26 ENCOUNTER — Telehealth: Payer: Self-pay

## 2024-03-26 NOTE — Telephone Encounter (Signed)
 Please see phone note from 03/25/2024     Copied from CRM #[id number]. Topic: Clinical - Order For Equipment >> Mar 25, 2024  1:15 PM Lavanda D wrote: Reason for CRM: Patient returning call back from the clinic. Please call back if needed. It may have been regarding his order for [employer]. >> Mar 25, 2024  1:59 PM Ismael A wrote: Pt's wife, Janie called back trying to get [employer], attempted to transfer to [facility] but no response - please call back at earliest convenience, she was upset that she has had to call back multiple times

## 2024-03-31 NOTE — Telephone Encounter (Signed)
 Called the pt to ensure this was handled  There was no answer- I left detailed msg and asked that they call us  back if [employer] has not reached out to them

## 2024-04-08 ENCOUNTER — Ambulatory Visit: Admitting: Internal Medicine

## 2024-04-13 ENCOUNTER — Other Ambulatory Visit: Payer: Self-pay

## 2024-04-13 ENCOUNTER — Other Ambulatory Visit: Payer: Self-pay | Admitting: Internal Medicine

## 2024-04-13 ENCOUNTER — Other Ambulatory Visit (HOSPITAL_BASED_OUTPATIENT_CLINIC_OR_DEPARTMENT_OTHER): Payer: Self-pay

## 2024-04-13 DIAGNOSIS — I731 Thromboangiitis obliterans [Buerger's disease]: Secondary | ICD-10-CM

## 2024-04-13 DIAGNOSIS — G609 Hereditary and idiopathic neuropathy, unspecified: Secondary | ICD-10-CM

## 2024-04-13 NOTE — Telephone Encounter (Unsigned)
 Copied from CRM [id number]. Topic: Clinical - Medication Refill >> Apr 13, 2024 12:52 PM Winona R wrote: Medication:  pregabalin  (LYRICA ) 150 MG capsule- Pt can only get it for 30 Days but would like to get 90 days.  cilostazol  (PLETAL ) 50 MG tablet- Pt has 15 left but pt would also like this medication for 90 days.   Has the patient contacted their pharmacy? Yes (Agent: If no, request that the patient contact the pharmacy for the refill. If patient does not wish to contact the pharmacy document the reason why and proceed with request.) (Agent: If yes, when and what did the pharmacy advise?)  This is the patient's preferred pharmacy:  [facility] [address] [place] [address] Phone: [phone] Fax: [phone]  [facility] [id number] [place], [place] - [address] [place] [address] Phone: [phone] Fax: [phone]  Is this the correct pharmacy for this prescription? Yes If no, delete pharmacy and type the correct one.   Has the prescription been filled recently? Yes  Is the patient out of the medication?yes pregabalin   Has the patient been seen for an appointment in the last year OR does the patient have an upcoming appointment? Yes  Can we respond through MyChart? No  Agent: Please be advised that Rx refills may take up to 3 business days. We ask that you follow-up with your pharmacy.

## 2024-04-14 ENCOUNTER — Other Ambulatory Visit (HOSPITAL_BASED_OUTPATIENT_CLINIC_OR_DEPARTMENT_OTHER): Payer: Self-pay

## 2024-04-14 MED ORDER — CILOSTAZOL 50 MG PO TABS
50.0000 mg | ORAL_TABLET | Freq: Two times a day (BID) | ORAL | 4 refills | Status: DC
  Filled 2024-04-14: qty 90, 45d supply, fill #0

## 2024-04-15 ENCOUNTER — Other Ambulatory Visit (HOSPITAL_BASED_OUTPATIENT_CLINIC_OR_DEPARTMENT_OTHER): Payer: Self-pay

## 2024-04-15 ENCOUNTER — Ambulatory Visit: Admitting: Internal Medicine

## 2024-04-15 ENCOUNTER — Encounter: Payer: Self-pay | Admitting: Internal Medicine

## 2024-04-15 VITALS — BP 130/60 | HR 84 | Temp 99.0°F | Resp 20 | Ht 70.0 in | Wt 185.8 lb

## 2024-04-15 DIAGNOSIS — N182 Chronic kidney disease, stage 2 (mild): Secondary | ICD-10-CM

## 2024-04-15 DIAGNOSIS — E785 Hyperlipidemia, unspecified: Secondary | ICD-10-CM

## 2024-04-15 DIAGNOSIS — L989 Disorder of the skin and subcutaneous tissue, unspecified: Secondary | ICD-10-CM

## 2024-04-15 DIAGNOSIS — G250 Essential tremor: Secondary | ICD-10-CM | POA: Diagnosis not present

## 2024-04-15 DIAGNOSIS — G629 Polyneuropathy, unspecified: Secondary | ICD-10-CM

## 2024-04-15 DIAGNOSIS — I731 Thromboangiitis obliterans [Buerger's disease]: Secondary | ICD-10-CM | POA: Diagnosis not present

## 2024-04-15 DIAGNOSIS — M150 Primary generalized (osteo)arthritis: Secondary | ICD-10-CM | POA: Diagnosis not present

## 2024-04-15 DIAGNOSIS — L210 Seborrhea capitis: Secondary | ICD-10-CM

## 2024-04-15 DIAGNOSIS — E559 Vitamin D deficiency, unspecified: Secondary | ICD-10-CM | POA: Diagnosis not present

## 2024-04-15 DIAGNOSIS — I7300 Raynaud's syndrome without gangrene: Secondary | ICD-10-CM

## 2024-04-15 DIAGNOSIS — L218 Other seborrheic dermatitis: Secondary | ICD-10-CM

## 2024-04-15 DIAGNOSIS — R399 Unspecified symptoms and signs involving the genitourinary system: Secondary | ICD-10-CM

## 2024-04-15 DIAGNOSIS — I700 Atherosclerosis of aorta: Secondary | ICD-10-CM | POA: Diagnosis not present

## 2024-04-15 DIAGNOSIS — J302 Other seasonal allergic rhinitis: Secondary | ICD-10-CM

## 2024-04-15 DIAGNOSIS — Z79899 Other long term (current) drug therapy: Secondary | ICD-10-CM | POA: Insufficient documentation

## 2024-04-15 DIAGNOSIS — G609 Hereditary and idiopathic neuropathy, unspecified: Secondary | ICD-10-CM

## 2024-04-15 DIAGNOSIS — K219 Gastro-esophageal reflux disease without esophagitis: Secondary | ICD-10-CM

## 2024-04-15 DIAGNOSIS — E781 Pure hyperglyceridemia: Secondary | ICD-10-CM

## 2024-04-15 MED ORDER — ATORVASTATIN CALCIUM 40 MG PO TABS
40.0000 mg | ORAL_TABLET | Freq: Every day | ORAL | 3 refills | Status: AC
  Filled 2024-04-15: qty 90, 90d supply, fill #0

## 2024-04-15 MED ORDER — TRIAMCINOLONE ACETONIDE 0.1 % EX CREA
1.0000 | TOPICAL_CREAM | Freq: Two times a day (BID) | CUTANEOUS | 3 refills | Status: AC
  Filled 2024-04-15: qty 30, 30d supply, fill #0

## 2024-04-15 MED ORDER — PRIMIDONE 50 MG PO TABS
50.0000 mg | ORAL_TABLET | Freq: Two times a day (BID) | ORAL | 3 refills | Status: AC
  Filled 2024-04-15: qty 180, 90d supply, fill #0

## 2024-04-15 MED ORDER — DICLOFENAC SODIUM 1 % EX GEL
2.0000 g | Freq: Two times a day (BID) | CUTANEOUS | 11 refills | Status: AC
  Filled 2024-04-15: qty 300, 30d supply, fill #0

## 2024-04-15 MED ORDER — LIDOCAINE 5 % EX OINT
1.0000 | TOPICAL_OINTMENT | CUTANEOUS | 0 refills | Status: AC | PRN
  Filled 2024-04-15: qty 35.44, 30d supply, fill #0

## 2024-04-15 MED ORDER — VITAMIN D-3 125 MCG (5000 UT) PO TABS
1.0000 | ORAL_TABLET | Freq: Every day | ORAL | 4 refills | Status: AC
  Filled 2024-04-15: qty 100, 100d supply, fill #0

## 2024-04-15 MED ORDER — CELECOXIB 100 MG PO CAPS
100.0000 mg | ORAL_CAPSULE | Freq: Two times a day (BID) | ORAL | 4 refills | Status: AC
  Filled 2024-04-15: qty 180, 90d supply, fill #0

## 2024-04-15 MED ORDER — ALPHA-LIPOIC ACID 300 MG PO CAPS
600.0000 mg | ORAL_CAPSULE | Freq: Every day | ORAL | 4 refills | Status: AC
  Filled 2024-04-15: qty 180, 90d supply, fill #0

## 2024-04-15 MED ORDER — KETOCONAZOLE 2 % EX SHAM
MEDICATED_SHAMPOO | CUTANEOUS | 5 refills | Status: AC
  Filled 2024-04-15 (×2): qty 120, 30d supply, fill #0

## 2024-04-15 MED ORDER — DULOXETINE HCL 30 MG PO CPEP
30.0000 mg | ORAL_CAPSULE | Freq: Every day | ORAL | 3 refills | Status: AC
  Filled 2024-04-15: qty 90, 90d supply, fill #0

## 2024-04-15 MED ORDER — NIFEDIPINE ER OSMOTIC RELEASE 30 MG PO TB24
30.0000 mg | ORAL_TABLET | Freq: Every day | ORAL | 1 refills | Status: AC
  Filled 2024-04-15 (×2): qty 90, 90d supply, fill #0

## 2024-04-15 MED ORDER — CILOSTAZOL 50 MG PO TABS
50.0000 mg | ORAL_TABLET | Freq: Two times a day (BID) | ORAL | 4 refills | Status: AC
  Filled 2024-04-16: qty 180, 90d supply, fill #0
  Filled 2024-04-16: qty 90, 45d supply, fill #0

## 2024-04-15 MED ORDER — PREGABALIN 150 MG PO CAPS
150.0000 mg | ORAL_CAPSULE | Freq: Two times a day (BID) | ORAL | 1 refills | Status: AC
  Filled 2024-04-15: qty 180, 90d supply, fill #0

## 2024-04-15 MED ORDER — PANTOPRAZOLE SODIUM 20 MG PO TBEC
20.0000 mg | DELAYED_RELEASE_TABLET | Freq: Every day | ORAL | 4 refills | Status: AC
  Filled 2024-04-15: qty 90, 90d supply, fill #0

## 2024-04-15 MED ORDER — OMEGA-3-ACID ETHYL ESTERS 1 G PO CAPS
2.0000 g | ORAL_CAPSULE | Freq: Two times a day (BID) | ORAL | 1 refills | Status: AC
  Filled 2024-04-15: qty 120, 30d supply, fill #0
  Filled 2024-04-30: qty 400, 100d supply, fill #0

## 2024-04-15 MED ORDER — FLUTICASONE PROPIONATE 50 MCG/ACT NA SUSP
1.0000 | Freq: Every day | NASAL | 11 refills | Status: AC
  Filled 2024-04-15: qty 16, 60d supply, fill #0

## 2024-04-15 MED ORDER — SELENIUM SULFIDE 2.5 % EX LOTN
1.0000 | TOPICAL_LOTION | Freq: Every day | CUTANEOUS | 12 refills | Status: AC | PRN
  Filled 2024-04-15: qty 120, 24d supply, fill #0

## 2024-04-15 MED ORDER — VITAMIN C 500 MG PO CHEW
500.0000 mg | CHEWABLE_TABLET | Freq: Every day | ORAL | 4 refills | Status: AC
  Filled 2024-04-15: qty 90, 90d supply, fill #0

## 2024-04-15 MED ORDER — B-12 500 MCG PO TABS
1.0000 | ORAL_TABLET | Freq: Every day | ORAL | 4 refills | Status: AC
  Filled 2024-04-15: qty 90, 90d supply, fill #0

## 2024-04-15 NOTE — Assessment & Plan Note (Signed)
 Refills medications per patient request

## 2024-04-15 NOTE — Progress Notes (Signed)
 ==============================  [facility]: [phone]   -- Medical Office Visit --  Patient: Stephen Anderson      Age: 84 y.o.       Sex:  male  Date:   04/15/2024 Today's Healthcare Provider: Bernardino KANDICE Cone, MD  ==============================   Chief Complaint: Medication Problem (Wants to discuss medications and refills. Wants a three month supply)   Discussed the use of AI scribe software for clinical note transcription with the patient, who gave verbal consent to proceed.  History of Present Illness  84 year old male who presents for medication management and urinary symptoms.  He experiences difficulties managing his medications, particularly pregabalin  and primidone . Previously on pregabalin  200 mg once daily at night, he has not started the new regimen of 150 mg twice daily due to lack of medication. He is frustrated with the pharmacy's inability to provide a 90-day supply of his medications, including pregabalin  and primidone , due to stock issues. He is attempting to organize his medications to avoid frequent pharmacy visits.  He takes primidone  50 mg, two tablets in the morning and one at night, but runs out of this medication more frequently than others. He has stopped using pill packs due to confusion and now uses a weekly pill organizer.  He experiences urinary symptoms, including difficulty postponing urination, urgency, and occasional incontinence. When he needs to urinate, he must go immediately to avoid leakage. He reports no weak stream and no nocturia, and states that he sleeps through the night while taking pregabalin . Certain drinks, like lemon water, increase his urgency.  He mentions a history of nerve damage, which he attributes to exposure to paint, and states that most of his medications are for managing this condition. IPSS Questionnaire (AUA-7): Over the past month   1)  How often have you had a sensation of not  emptying your bladder completely after you finish urinating?  2 - Less than half the time  2)  How often have you had to urinate again less than two hours after you finished urinating? 1 - Less than 1 time in 5  3)  How often have you found you stopped and started again several times when you urinated?  0 - Not at all  4) How difficult have you found it to postpone urination?  4 - More than half the time  5) How often have you had a weak urinary stream?  3 - About half the time  6) How often have you had to push or strain to begin urination?  2 - Less than half the time  7) How many times did you most typically get up to urinate from the time you went to bed until the time you got up in the morning?  0 - None  Total score:  0-7 mildly symptomatic   8-19 moderately symptomatic   20-35 severely symptomatic     Lab Results  Component Value Date   PSA 1.14 04/05/2022   PSA 1.14 05/24/2020   PSA 0.97 05/21/2019    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, not believe  to be due to lupus or systemic sclerosis; Diverticular disease; Right inguinal hernia; Aortic atherosclerosis; Bronchiectasis (HCC); Recurrent kidney stones; Calcification of prostate; DDD (degenerative disc disease), lumbar; LVH (left ventricular hypertrophy); Grade I diastolic dysfunction; Weight loss; Gastritis; Hiatal hernia; Lumbar back pain; Arthritis of both hands; Trigger finger, right ring finger; Chronic  kidney disease, stage 2 (mild); Prediabetes; 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 Past Surgical History:   has a past surgical history that includes Gallbladder surgery; Hernia repair; Cataract extraction, bilateral; and Melanoma excision. Social History:   reports that he quit smoking about 16 years ago. His smoking use included cigarettes. He started smoking about 66 years ago. He has a 50 pack-year smoking history. He has never been exposed to tobacco smoke. He quit smokeless tobacco use about 21 years ago. He reports that he does not drink alcohol and does not use drugs. Family History:  family history includes Bladder Cancer in his brother; Heart disease in his brother; Lung cancer in his father; Neuropathy in his daughter. Allergies:  is allergic to hydrocodone, oxycodone, oxycontin [oxycodone hcl], and simvastatin.   Medication Reconciliation: Current Outpatient Medications on File Prior to  Visit  Medication Sig   Acetaminophen (TYLENOL ARTHRITIS EXT RELIEF PO) Take 1 tablet by mouth every other day.   aspirin 81 MG tablet Take 81 mg by mouth every other day.    NON FORMULARY CPAP   Current Facility-Administered Medications on File Prior to Visit  Medication   methylPREDNISolone  acetate (DEPO-MEDROL ) injection 40 mg   Medications Discontinued During This Encounter  Medication Reason   Cyanocobalamin  (B-12 PO) Reorder   Ascorbic Acid  (VITAMIN C  PO) Reorder   Alpha-Lipoic Acid 300 MG TABS Reorder   Cholecalciferol  (VITAMIN D -3) 125 MCG (5000 UT) TABS Reorder   diclofenac  Sodium (VOLTAREN ) 1 % GEL Reorder   fluticasone  (FLONASE ) 50 MCG/ACT nasal spray Reorder   lidocaine  (XYLOCAINE ) 5 % ointment Reorder   primidone  (MYSOLINE ) 50 MG tablet Reorder   triamcinolone  cream (KENALOG ) 0.1 % Reorder   atorvastatin  (LIPITOR) 40 MG tablet Reorder   pantoprazole  (PROTONIX ) 20 MG tablet Reorder   celecoxib  (CELEBREX ) 100 MG capsule Reorder   selenium  sulfide (SELSUN ) 2.5 % lotion Reorder   ketoconazole  (NIZORAL ) 2 % shampoo Reorder   pregabalin  (LYRICA ) 150 MG capsule Reorder   NIFEdipine  (PROCARDIA -XL/NIFEDICAL-XL) 30 MG 24 hr tablet Reorder   omega-3 acid ethyl esters (LOVAZA ) 1 g capsule Reorder   DULoxetine  (CYMBALTA ) 30 MG capsule Reorder   cilostazol  (PLETAL ) 50 MG tablet Reorder     Physical Exam:    04/15/2024    2:27 PM 03/12/2024    7:49 AM 03/03/2024    9:53 AM  Vitals with BMI  Height 5' 10 5' 10 5' 10  Weight 185 lbs 13 oz 176 lbs 6  oz 180 lbs 6 oz  BMI 26.66 25.31 25.88  Systolic 130 112 869  Diastolic 60 78 64  Pulse 84 68 61  Vital signs reviewed.  Nursing notes reviewed. Weight trend reviewed. Physical Activity: Not on file   General Appearance:  No acute distress appreciable.   Well-groomed, healthy-appearing male.  Well proportioned with no abnormal fat distribution.  Good muscle tone. Pulmonary:  Normal work of breathing at rest, no respiratory  distress apparent. SpO2: 96 %  Musculoskeletal: All extremities are intact.  Neurological:  Awake, alert, oriented, and engaged.  No obvious focal neurological deficits or cognitive impairments.  Sensorium seems unclouded.   Speech is clear and coherent with logical content. Psychiatric:  Appropriate mood, pleasant and cooperative demeanor, thoughtful and engaged during the exam       04/15/2024    2:27 PM 05/17/2023    8:48 AM 05/01/2023    8:35 AM 03/27/2023    8:50 AM  PHQ 2/9 Scores  PHQ - 2 Score 0 0 0 0  PHQ- 9 Score   2  1      Data saved with a previous flowsheet row definition   Office Visit on 03/12/2024  Component Date Value Ref Range Status   Creatinine, Urine 03/12/2024 93  20 - 320 mg/dL Final   Protein/Creat Ratio 03/12/2024 161 (H)  25 - 148 mg/g creat Final   Protein/Creatinine Ratio 03/12/2024 0.161 (H)  0.025 - 0.148 mg/mg creat Final   Total Protein, Urine 03/12/2024 15  5 - 25 mg/dL Final   Microalb, Ur 87/95/7974 5.1 (H)  0.0 - 1.9 mg/dL Final   Creatinine,U 87/95/7974 91.9  mg/dL Final   Microalb Creat Ratio 03/12/2024 55.1 (H)  0.0 - 30.0 mg/g Final   Magnesium 03/12/2024 2.0  1.5 - 2.5 mg/dL Final   Color, Urine 87/95/7974 YELLOW  Yellow;Lt. Yellow;Straw;Dark Yellow;Amber;Green;Red;Brown Final   APPearance 03/12/2024 CLEAR  Clear;Turbid;Slightly Cloudy;Cloudy Final   Specific Gravity, Urine 03/12/2024 1.020  1.000 - 1.030 Final   pH 03/12/2024 6.0  5.0 - 8.0 Final   Total Protein, Urine 03/12/2024 NEGATIVE  Negative Final   Urine Glucose 03/12/2024 NEGATIVE  Negative Final   Ketones, ur 03/12/2024 NEGATIVE  Negative Final   Bilirubin Urine 03/12/2024 NEGATIVE  Negative Final   Hgb urine dipstick 03/12/2024 NEGATIVE  Negative Final   Urobilinogen, UA 03/12/2024 0.2  0.0 - 1.0 Final   Leukocytes,Ua 03/12/2024 NEGATIVE  Negative Final   Nitrite 03/12/2024 NEGATIVE  Negative Final   WBC, UA 03/12/2024 0-2/hpf  0-2/hpf Final   RBC / HPF 03/12/2024 none seen   0-2/hpf Final   Mucus, UA 03/12/2024 Presence of (A)  None Final   VITD 03/12/2024 34.40  30.00 - 100.00 ng/mL Final   PTH 03/12/2024 38  16 - 77 pg/mL Final   Calcium  03/12/2024 9.6  8.6 - 10.3 mg/dL Final   Uric Acid, Serum 03/12/2024 5.5  4.0 - 7.8 mg/dL Final   Sodium 87/95/7974 140  135 - 145 mEq/L Final   Potassium 03/12/2024 4.0  3.5 - 5.1 mEq/L Final   Chloride 03/12/2024 103  96 - 112 mEq/L Final   CO2 03/12/2024 29  19 - 32 mEq/L Final   Glucose, Bld 03/12/2024 104 (H)  70 - 99 mg/dL Final   BUN 87/95/7974 23  6 - 23 mg/dL Final   Creatinine, Ser 03/12/2024 1.03  0.40 - 1.50 mg/dL Final   Total Bilirubin 03/12/2024 0.6  0.2 - 1.2 mg/dL Final   Alkaline  Phosphatase 03/12/2024 67  39 - 117 U/L Final   AST 03/12/2024 21  0 - 37 U/L Final   ALT 03/12/2024 18  0 - 53 U/L Final   Total Protein 03/12/2024 7.3  6.0 - 8.3 g/dL Final   Albumin 87/95/7974 4.8  3.5 - 5.2 g/dL Final   GFR 87/95/7974 68.02  >60.00 mL/min Final   Calcium  03/12/2024 9.2  8.4 - 10.5 mg/dL Final   WBC 87/95/7974 5.8  4.0 - 10.5 K/uL Final   RBC 03/12/2024 4.86  4.22 - 5.81 Mil/uL Final   Hemoglobin 03/12/2024 15.0  13.0 - 17.0 g/dL Final   HCT 87/95/7974 44.2  39.0 - 52.0 % Final   MCV 03/12/2024 90.9  78.0 - 100.0 fl Final   MCHC 03/12/2024 33.9  30.0 - 36.0 g/dL Final   RDW 87/95/7974 15.0  11.5 - 15.5 % Final   Platelets 03/12/2024 177.0  150.0 - 400.0 K/uL Final   Neutrophils Relative % 03/12/2024 61.7  43.0 - 77.0 % Final   Lymphocytes Relative 03/12/2024 26.5  12.0 - 46.0 % Final   Monocytes Relative 03/12/2024 8.7  3.0 - 12.0 % Final   Eosinophils Relative 03/12/2024 2.3  0.0 - 5.0 % Final   Basophils Relative 03/12/2024 0.8  0.0 - 3.0 % Final   Neutro Abs 03/12/2024 3.6  1.4 - 7.7 K/uL Final   Lymphs Abs 03/12/2024 1.5  0.7 - 4.0 K/uL Final   Monocytes Absolute 03/12/2024 0.5  0.1 - 1.0 K/uL Final   Eosinophils Absolute 03/12/2024 0.1  0.0 - 0.7 K/uL Final   Basophils Absolute 03/12/2024 0.0   0.0 - 0.1 K/uL Final   Vitamin B-12 03/12/2024 916 (H)  211 - 911 pg/mL Final   Methylmalonic Acid, Quant 03/12/2024 133  85 - 423 nmol/L Final   Cholesterol 03/12/2024 130  0 - 200 mg/dL Final   Triglycerides 87/95/7974 78.0  0.0 - 149.0 mg/dL Final   HDL 87/95/7974 47.50  >39.00 mg/dL Final   VLDL 87/95/7974 15.6  0.0 - 40.0 mg/dL Final   LDL Cholesterol 03/12/2024 66  0 - 99 mg/dL Final   Total CHOL/HDL Ratio 03/12/2024 3   Final   NonHDL 03/12/2024 82.09   Final  Office Visit on 10/07/2023  Component Date Value Ref Range Status   WBC 10/07/2023 8.0  4.0 - 10.5 K/uL Final   RBC 10/07/2023 4.90  4.22 - 5.81 Mil/uL Final   Hemoglobin 10/07/2023 14.8  13.0 - 17.0 g/dL Final   HCT 93/69/7974 44.0  39.0 - 52.0 % Final   MCV 10/07/2023 89.9  78.0 - 100.0 fl Final   MCHC 10/07/2023 33.6  30.0 - 36.0 g/dL Final   RDW 93/69/7974 14.1  11.5 - 15.5 % Final   Platelets 10/07/2023 227.0  150.0 - 400.0 K/uL Final   Neutrophils Relative % 10/07/2023 58.8  43.0 - 77.0 % Final   Lymphocytes Relative 10/07/2023 27.5  12.0 - 46.0 % Final   Monocytes Relative 10/07/2023 10.4  3.0 - 12.0 % Final   Eosinophils Relative 10/07/2023 2.2  0.0 - 5.0 % Final   Basophils Relative 10/07/2023 1.1  0.0 - 3.0 % Final   Neutro Abs 10/07/2023 4.7  1.4 - 7.7 K/uL Final   Lymphs Abs 10/07/2023 2.2  0.7 - 4.0 K/uL Final   Monocytes Absolute 10/07/2023 0.8  0.1 - 1.0 K/uL Final   Eosinophils Absolute 10/07/2023 0.2  0.0 - 0.7 K/uL Final   Basophils Absolute 10/07/2023 0.1  0.0 -  0.1 K/uL Final   Sodium 10/07/2023 142  135 - 145 mEq/L Final   Potassium 10/07/2023 4.1  3.5 - 5.1 mEq/L Final   Chloride 10/07/2023 104  96 - 112 mEq/L Final   CO2 10/07/2023 26  19 - 32 mEq/L Final   Glucose, Bld 10/07/2023 94  70 - 99 mg/dL Final   BUN 93/69/7974 31 (H)  6 - 23 mg/dL Final   Creatinine, Ser 10/07/2023 1.49  0.40 - 1.50 mg/dL Final   Total Bilirubin 10/07/2023 0.5  0.2 - 1.2 mg/dL Final   Alkaline Phosphatase 10/07/2023  82  39 - 117 U/L Final   AST 10/07/2023 26  0 - 37 U/L Final   ALT 10/07/2023 40  0 - 53 U/L Final   Total Protein 10/07/2023 7.1  6.0 - 8.3 g/dL Final   Albumin 93/69/7974 4.6  3.5 - 5.2 g/dL Final   GFR 93/69/7974 43.80 (L)  >60.00 mL/min Final   Calcium  10/07/2023 9.4  8.4 - 10.5 mg/dL Final   Sed Rate 93/69/7974 7  0 - 20 mm/hr Final   Pro B Natriuretic peptide (BNP) 10/07/2023 12.0  0.0 - 100.0 pg/mL Final   Vitamin B-12 10/07/2023 1,138 (H)  211 - 911 pg/mL Final   Folate 10/07/2023 >23.2  >5.9 ng/mL Final   Rheumatoid fact SerPl-aCnc 10/07/2023 <10  <14 IU/mL Final   Anti Nuclear Antibody (ANA) 10/07/2023 POSITIVE (A)  NEGATIVE Final   ANA Titer 1 10/07/2023 1:40 (H)  titer Final   ANA Pattern 1 10/07/2023 Nuclear, Homogeneous (A)   Final  Office Visit on 08/21/2023  Component Date Value Ref Range Status   MICRO NUMBER: 08/21/2023 [id number]   Final   SPECIMEN QUALITY: 08/21/2023 Adequate   Final   Source: 08/21/2023 NOT GIVEN   Final   STATUS: 08/21/2023 FINAL   Final   GRAM STAIN: 08/21/2023 No epithelial cells seen No white blood cells seen No organisms seen   Final   ANA RESULT: 08/21/2023 No anaerobes isolated.   Final   MICRO NUMBER: 08/21/2023 [id number]   Final   SPECIMEN QUALITY: 08/21/2023 Adequate   Final   SOURCE: 08/21/2023 NOT GIVEN   Final   STATUS: 08/21/2023 FINAL   Final   AER RESULT: 08/21/2023 No Growth   Final   COMMENT: 08/21/2023 No source was provided. The specimen was tested and reported based upon the test code ordered. If this is incorrect, please contact client services.   Final  Office Visit on 08/14/2023  Component Date Value Ref Range Status   WBC 08/14/2023 11.4 (H)  4.0 - 10.5 K/uL Final   RBC 08/14/2023 4.23  4.22 - 5.81 Mil/uL Final   Hemoglobin 08/14/2023 13.0  13.0 - 17.0 g/dL Final   HCT 94/92/7974 38.3 (L)  39.0 - 52.0 % Final   MCV 08/14/2023 90.5  78.0 - 100.0 fl Final   MCHC 08/14/2023 33.9  30.0 - 36.0 g/dL Final   RDW 94/92/7974 13.9   11.5 - 15.5 % Final   Platelets 08/14/2023 228.0  150.0 - 400.0 K/uL Final   Neutrophils Relative % 08/14/2023 79.4 (H)  43.0 - 77.0 % Final   Lymphocytes Relative 08/14/2023 13.4  12.0 - 46.0 % Final   Monocytes Relative 08/14/2023 6.2  3.0 - 12.0 % Final   Eosinophils Relative 08/14/2023 0.2  0.0 - 5.0 % Final   Basophils Relative 08/14/2023 0.8  0.0 - 3.0 % Final   Neutro Abs 08/14/2023 9.0 (H)  1.4 - 7.7 K/uL  Final   Lymphs Abs 08/14/2023 1.5  0.7 - 4.0 K/uL Final   Monocytes Absolute 08/14/2023 0.7  0.1 - 1.0 K/uL Final   Eosinophils Absolute 08/14/2023 0.0  0.0 - 0.7 K/uL Final   Basophils Absolute 08/14/2023 0.1  0.0 - 0.1 K/uL Final   Sodium 08/14/2023 137  135 - 145 mEq/L Final   Potassium 08/14/2023 4.8  3.5 - 5.1 mEq/L Final   Chloride 08/14/2023 105  96 - 112 mEq/L Final   CO2 08/14/2023 24  19 - 32 mEq/L Final   Glucose, Bld 08/14/2023 119 (H)  70 - 99 mg/dL Final   BUN 94/92/7974 27 (H)  6 - 23 mg/dL Final   Creatinine, Ser 08/14/2023 1.11  0.40 - 1.50 mg/dL Final   Total Bilirubin 08/14/2023 0.2  0.2 - 1.2 mg/dL Final   Alkaline Phosphatase 08/14/2023 74  39 - 117 U/L Final   AST 08/14/2023 28  0 - 37 U/L Final   ALT 08/14/2023 38  0 - 53 U/L Final   Total Protein 08/14/2023 7.0  6.0 - 8.3 g/dL Final   Albumin 94/92/7974 3.7  3.5 - 5.2 g/dL Final   GFR 94/92/7974 62.43  >60.00 mL/min Final   Calcium  08/14/2023 8.7  8.4 - 10.5 mg/dL Final   Pro B Natriuretic peptide (BNP) 08/14/2023 24.0  0.0 - 100.0 pg/mL Final   CRP 08/14/2023 4.0  0.5 - 20.0 mg/dL Final   Sed Rate 94/92/7974 44 (H)  0 - 20 mm/hr Final   Vitamin B-12 08/14/2023 634  211 - 911 pg/mL Final   Folate 08/14/2023 13.1  >5.9 ng/mL Final   SARS Coronavirus 2 Ag 08/14/2023 Negative  Negative Final  Office Visit on 07/31/2023  Component Date Value Ref Range Status   WBC 07/31/2023 7.0  4.0 - 10.5 K/uL Final   RBC 07/31/2023 4.75  4.22 - 5.81 Mil/uL Final   Hemoglobin 07/31/2023 14.5  13.0 - 17.0 g/dL Final    HCT 95/76/7974 43.5  39.0 - 52.0 % Final   MCV 07/31/2023 91.7  78.0 - 100.0 fl Final   MCHC 07/31/2023 33.3  30.0 - 36.0 g/dL Final   RDW 95/76/7974 14.0  11.5 - 15.5 % Final   Platelets 07/31/2023 167.0  150.0 - 400.0 K/uL Final   Neutrophils Relative % 07/31/2023 64.7  43.0 - 77.0 % Final   Lymphocytes Relative 07/31/2023 24.9  12.0 - 46.0 % Final   Monocytes Relative 07/31/2023 7.5  3.0 - 12.0 % Final   Eosinophils Relative 07/31/2023 2.3  0.0 - 5.0 % Final   Basophils Relative 07/31/2023 0.6  0.0 - 3.0 % Final   Neutro Abs 07/31/2023 4.5  1.4 - 7.7 K/uL Final   Lymphs Abs 07/31/2023 1.7  0.7 - 4.0 K/uL Final   Monocytes Absolute 07/31/2023 0.5  0.1 - 1.0 K/uL Final   Eosinophils Absolute 07/31/2023 0.2  0.0 - 0.7 K/uL Final   Basophils Absolute 07/31/2023 0.0  0.0 - 0.1 K/uL Final   Sodium 07/31/2023 138  135 - 145 mEq/L Final   Potassium 07/31/2023 4.2  3.5 - 5.1 mEq/L Final   Chloride 07/31/2023 104  96 - 112 mEq/L Final   CO2 07/31/2023 28  19 - 32 mEq/L Final   Glucose, Bld 07/31/2023 101 (H)  70 - 99 mg/dL Final   BUN 95/76/7974 27 (H)  6 - 23 mg/dL Final   Creatinine, Ser 07/31/2023 1.18  0.40 - 1.50 mg/dL Final   Total Bilirubin 07/31/2023 0.6  0.2 - 1.2 mg/dL Final   Alkaline Phosphatase 07/31/2023 59  39 - 117 U/L Final   AST 07/31/2023 16  0 - 37 U/L Final   ALT 07/31/2023 14  0 - 53 U/L Final   Total Protein 07/31/2023 6.7  6.0 - 8.3 g/dL Final   Albumin 95/76/7974 4.4  3.5 - 5.2 g/dL Final   GFR 95/76/7974 58.03 (L)  >60.00 mL/min Final   Calcium  07/31/2023 8.8  8.4 - 10.5 mg/dL Final   Phosphorus 95/76/7974 2.6  2.3 - 4.6 mg/dL Final   VITD 95/76/7974 47.95  30.00 - 100.00 ng/mL Final   PTH 07/31/2023 49  16 - 77 pg/mL Final   Uric Acid, Serum 07/31/2023 5.8  4.0 - 7.8 mg/dL Final   Color, Urine 95/76/7974 YELLOW  YELLOW Final   APPearance 07/31/2023 CLEAR  CLEAR Final   Specific Gravity, Urine 07/31/2023 1.019  1.001 - 1.035 Final   pH 07/31/2023 6.0  5.0 -  8.0 Final   Glucose, UA 07/31/2023 NEGATIVE  NEGATIVE Final   Bilirubin Urine 07/31/2023 NEGATIVE  NEGATIVE Final   Ketones, ur 07/31/2023 NEGATIVE  NEGATIVE Final   Hgb urine dipstick 07/31/2023 NEGATIVE  NEGATIVE Final   Protein, ur 07/31/2023 NEGATIVE  NEGATIVE Final   Nitrites, Initial 07/31/2023 NEGATIVE  NEGATIVE Final   Leukocyte Esterase 07/31/2023 NEGATIVE  NEGATIVE Final   WBC, UA 07/31/2023 NONE SEEN  0 - 5 /HPF Final   RBC / HPF 07/31/2023 NONE SEEN  0 - 2 /HPF Final   Squamous Epithelial / HPF 07/31/2023 NONE SEEN  < OR = 5 /HPF Final   Bacteria, UA 07/31/2023 NONE SEEN  NONE SEEN /HPF Final   Hyaline Cast 07/31/2023 NONE SEEN  NONE SEEN /LPF Final   Note 07/31/2023    Final   Vitamin B-12 07/31/2023 483  211 - 911 pg/mL Final   Methylmalonic Acid, Quant 07/31/2023 106  85 - 423 nmol/L Final   Ceruloplasmin 07/31/2023 20  14 - 30 mg/dL Final   Zinc  07/31/2023 60  60 - 130 mcg/dL Final   Total Protein 95/76/7974 6.4  6.1 - 8.1 g/dL Final   Albumin ELP 95/76/7974 4.1  3.8 - 4.8 g/dL Final   Alpha 1 95/76/7974 0.2  0.2 - 0.3 g/dL Final   Alpha 2 95/76/7974 0.7  0.5 - 0.9 g/dL Final   Beta Globulin 95/76/7974 0.4  0.4 - 0.6 g/dL Final   Beta 2 95/76/7974 0.3  0.2 - 0.5 g/dL Final   Gamma Globulin 07/31/2023 0.7 (L)  0.8 - 1.7 g/dL Final   Abnormal Protein Band1 07/31/2023   NONE DETECTED g/dL Final   SPE Interp. 95/76/7974    Final   Protein, Ur 08/02/2023 9.3  Not Estab. mg/dL Final   Protein, 75Y Urine 08/02/2023 Comment  30 - 150 mg/24 hr Final   ALBUMIN, U 08/02/2023 100.0  % Final   ALPHA 1 URINE 08/02/2023 0.0  % Final   ALPHA-2-GLOBULIN, U 08/02/2023 0.0  % Final   % BETA, Urine 08/02/2023 0.0  % Final   GAMMA GLOBULIN URINE 08/02/2023 0.0  % Final   M-SPIKE, % 08/02/2023 Not Observed  Not Observed % Final   M-Spike, mg/24 hr 08/02/2023 Comment  Not Observed mg/24 hr Final   Immunofixation, Urine 08/02/2023 Comment   Final   NOTE: 08/02/2023 Comment   Final    Free Kappa Lt Chains,Ur 08/02/2023 45.86  1.17 - 86.46 mg/L Final   Free Lambda Lt Chains,Ur 08/02/2023 8.15  0.27 - 15.21 mg/L Final   Kappa/Lambda Ratio,U 08/02/2023 5.63  1.83 - 14.26 Final   ANCA SCREEN 07/31/2023 Negative  Negative Final   Sed Rate 07/31/2023 4  0 - 20 mm/hr Final   CRP 07/31/2023 <1.0  0.5 - 20.0 mg/dL Final   Reflexve Urine Culture 07/31/2023    Final  Office Visit on 03/27/2023  Component Date Value Ref Range Status   Vitamin B-12 03/27/2023 1,492 (H)  211 - 911 pg/mL Final   Folate 03/27/2023 16.0  >5.9 ng/mL Final   Hgb A1c MFr Bld 03/27/2023 5.9  4.6 - 6.5 % Final   Ferritin 03/27/2023 53.8  22.0 - 322.0 ng/mL Final   Sodium 03/27/2023 142  135 - 145 mEq/L Final   Potassium 03/27/2023 4.3  3.5 - 5.1 mEq/L Final   Chloride 03/27/2023 105  96 - 112 mEq/L Final   CO2 03/27/2023 30  19 - 32 mEq/L Final   Glucose, Bld 03/27/2023 109 (H)  70 - 99 mg/dL Final   BUN 87/81/7975 21  6 - 23 mg/dL Final   Creatinine, Ser 03/27/2023 1.03  0.40 - 1.50 mg/dL Final   Total Bilirubin 03/27/2023 0.5  0.2 - 1.2 mg/dL Final   Alkaline Phosphatase 03/27/2023 68  39 - 117 U/L Final   AST 03/27/2023 17  0 - 37 U/L Final   ALT 03/27/2023 14  0 - 53 U/L Final   Total Protein 03/27/2023 6.8  6.0 - 8.3 g/dL Final   Albumin 87/81/7975 4.5  3.5 - 5.2 g/dL Final   GFR 87/81/7975 68.48  >60.00 mL/min Final   Calcium  03/27/2023 8.9  8.4 - 10.5 mg/dL Final   WBC 87/81/7975 6.3  4.0 - 10.5 K/uL Final   RBC 03/27/2023 4.74  4.22 - 5.81 Mil/uL Final   Hemoglobin 03/27/2023 14.6  13.0 - 17.0 g/dL Final   HCT 87/81/7975 43.8  39.0 - 52.0 % Final   MCV 03/27/2023 92.3  78.0 - 100.0 fl Final   MCHC 03/27/2023 33.3  30.0 - 36.0 g/dL Final   RDW 87/81/7975 14.0  11.5 - 15.5 % Final   Platelets 03/27/2023 185.0  150.0 - 400.0 K/uL Final   Neutrophils Relative % 03/27/2023 60.8  43.0 - 77.0 % Final   Lymphocytes Relative 03/27/2023 27.2  12.0 - 46.0 % Final   Monocytes Relative 03/27/2023 9.0   3.0 - 12.0 % Final   Eosinophils Relative 03/27/2023 2.3  0.0 - 5.0 % Final   Basophils Relative 03/27/2023 0.7  0.0 - 3.0 % Final   Neutro Abs 03/27/2023 3.8  1.4 - 7.7 K/uL Final   Lymphs Abs 03/27/2023 1.7  0.7 - 4.0 K/uL Final   Monocytes Absolute 03/27/2023 0.6  0.1 - 1.0 K/uL Final   Eosinophils Absolute 03/27/2023 0.1  0.0 - 0.7 K/uL Final   Basophils Absolute 03/27/2023 0.0  0.0 - 0.1 K/uL Final   Cholesterol 03/27/2023 123  0 - 200 mg/dL Final   Triglycerides 87/81/7975 81.0  0.0 - 149.0 mg/dL Final   HDL 87/81/7975 41.00  >39.00 mg/dL Final   VLDL 87/81/7975 16.2  0.0 - 40.0 mg/dL Final   LDL Cholesterol 03/27/2023 65  0 - 99 mg/dL Final   Total CHOL/HDL Ratio 03/27/2023 3   Final   NonHDL 03/27/2023 81.51   Final   VITD 03/27/2023 34.36  30.00 - 100.00 ng/mL Final   Anti Nuclear Antibody (ANA) 03/27/2023 Negative  Negative Final   Rheumatoid fact SerPl-aCnc 03/27/2023 <10  <14 IU/mL  Final   T3 Uptake 03/27/2023 28  22 - 35 % Final   T4, Total 03/27/2023 7.5  4.9 - 10.5 mcg/dL Final   Free Thyroxine Index 03/27/2023 2.1  1.4 - 3.8 Final   TSH 03/27/2023 1.05  0.40 - 4.50 mIU/L Final   Arsenic, 24H Ur 04/04/2023 <10  <=80 mcg/L Final   Lead, 24 hr urine 04/04/2023 <10  <80 mcg/L Final   Mercury, 24H Ur 04/04/2023 <4  <=20 mcg/L Final  Office Visit on 09/24/2022  Component Date Value Ref Range Status   Rheumatoid fact SerPl-aCnc 09/24/2022 <10  <14 IU/mL Final   Sed Rate 09/24/2022 2  0 - 20 mm/h Final   Scleroderma (Scl-70) (ENA) Antibod* 09/24/2022 <1.0 NEG  <1.0 NEG AI Final   Ribonucleic Protein(ENA) Antibody,* 09/24/2022 <1.0 NEG  <1.0 NEG AI Final   ENA SM Ab Ser-aCnc 09/24/2022 <1.0 NEG  <1.0 NEG AI Final   SSA (Ro) (ENA) Antibody, IgG 09/24/2022 <1.0 NEG  <1.0 NEG AI Final   ds DNA Ab 09/24/2022 1  IU/mL Final   C3 Complement 09/24/2022 124  82 - 185 mg/dL Final   C4 Complement 93/82/7975 21  15 - 53 mg/dL Final  Office Visit on 09/05/2022  Component Date Value  Ref Range Status   WBC 09/05/2022 6.9  4.0 - 10.5 K/uL Final   RBC 09/05/2022 4.71  4.22 - 5.81 Mil/uL Final   Hemoglobin 09/05/2022 14.1  13.0 - 17.0 g/dL Final   HCT 94/70/7975 42.4  39.0 - 52.0 % Final   MCV 09/05/2022 90.2  78.0 - 100.0 fl Final   MCHC 09/05/2022 33.2  30.0 - 36.0 g/dL Final   RDW 94/70/7975 14.9  11.5 - 15.5 % Final   Platelets 09/05/2022 158.0  150.0 - 400.0 K/uL Final   Neutrophils Relative % 09/05/2022 62.2  43.0 - 77.0 % Final   Lymphocytes Relative 09/05/2022 25.1  12.0 - 46.0 % Final   Monocytes Relative 09/05/2022 9.9  3.0 - 12.0 % Final   Eosinophils Relative 09/05/2022 2.2  0.0 - 5.0 % Final   Basophils Relative 09/05/2022 0.6  0.0 - 3.0 % Final   Neutro Abs 09/05/2022 4.3  1.4 - 7.7 K/uL Final   Lymphs Abs 09/05/2022 1.7  0.7 - 4.0 K/uL Final   Monocytes Absolute 09/05/2022 0.7  0.1 - 1.0 K/uL Final   Eosinophils Absolute 09/05/2022 0.2  0.0 - 0.7 K/uL Final   Basophils Absolute 09/05/2022 0.0  0.0 - 0.1 K/uL Final   Ferritin 09/05/2022 29.4  22.0 - 322.0 ng/mL Final   Cholesterol 09/05/2022 123  0 - 200 mg/dL Final   Triglycerides 94/70/7975 160.0 (H)  0.0 - 149.0 mg/dL Final   HDL 94/70/7975 34.60 (L)  >60.99 mg/dL Final   VLDL 94/70/7975 32.0  0.0 - 40.0 mg/dL Final   LDL Cholesterol 09/05/2022 57  0 - 99 mg/dL Final   Total CHOL/HDL Ratio 09/05/2022 4   Final   NonHDL 09/05/2022 88.83   Final   Sodium 09/05/2022 142  135 - 145 mEq/L Final   Potassium 09/05/2022 4.4  3.5 - 5.1 mEq/L Final   Chloride 09/05/2022 107  96 - 112 mEq/L Final   CO2 09/05/2022 28  19 - 32 mEq/L Final   Glucose, Bld 09/05/2022 101 (H)  70 - 99 mg/dL Final   BUN 94/70/7975 19  6 - 23 mg/dL Final   Creatinine, Ser 09/05/2022 1.17  0.40 - 1.50 mg/dL Final   Total Bilirubin 09/05/2022 0.4  0.2 - 1.2 mg/dL Final   Alkaline Phosphatase 09/05/2022 73  39 - 117 U/L Final   AST 09/05/2022 18  0 - 37 U/L Final   ALT 09/05/2022 15  0 - 53 U/L Final   Total Protein 09/05/2022 6.5   6.0 - 8.3 g/dL Final   Albumin 94/70/7975 4.0  3.5 - 5.2 g/dL Final   GFR 94/70/7975 59.00 (L)  >60.00 mL/min Final   Calcium  09/05/2022 8.6  8.4 - 10.5 mg/dL Final   VITD 94/70/7975 46.57  30.00 - 100.00 ng/mL Final   Magnesium 09/05/2022 1.9  1.5 - 2.5 mg/dL Final   Phosphorus 94/70/7975 2.1 (L)  2.3 - 4.6 mg/dL Final   Uric Acid, Serum 09/05/2022 5.7  4.0 - 7.8 mg/dL Final  Scanned Document on 06/20/2022  Component Date Value Ref Range Status   [facility] Colonoscopy 06/20/2022 See Report (in chart)  See Report (in chart), Patient Reported Final  Appointment on 06/04/2022  Component Date Value Ref Range Status   Weight 06/04/2022 2,848  oz Final   Height 06/04/2022 70  in Final   BP 06/04/2022 122/70  mmHg Final   S' Lateral 06/04/2022 2.15  cm Final   Area-P 1/2 06/04/2022 3.77  cm2 Final   MV M vel 06/04/2022 3.23  m/s Final   MV Peak grad 06/04/2022 41.6  mmHg Final   Est EF 06/04/2022 55 - 60%   Final  There may be more visits with results that are not included.  No image results found. No results found.       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 He takes pregabalin  150 mg twice daily and primidone  50 mg twice daily. He finds managing multiple prescriptions challenging and prefers a 90-day supply to minimize pharmacy visits. Continue pregabalin  150 mg twice daily and primidone  50 mg twice daily. A 90-day supply of medications has been arranged. Essential tremor Essential tremor is managed with primidone , currently at 50 mg twice daily, with two tablets in the morning and one at night. Continue primidone  50 mg twice daily. 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 Symptoms suggest possible prostate issues. Referred to a urologist for prostate evaluation. Medication management   ORDER ASSOCIATIONS  #   DIAGNOSIS / CONDITION ICD-10 ENCOUNTER ORDER     ICD-10-CM   1. Lower urinary tract symptoms (LUTS)  R39.9 Ambulatory referral to Urology    2. Idiopathic peripheral neuropathy  G60.9 Alpha-Lipoic Acid 300 MG CAPS    celecoxib  (CELEBREX ) 100 MG capsule    diclofenac  Sodium (VOLTAREN ) 1 % GEL    DULoxetine  (CYMBALTA ) 30 MG capsule    lidocaine  (XYLOCAINE ) 5 % ointment    pregabalin  (LYRICA ) 150 MG capsule    Ascorbic Acid  (VITAMIN C ) 500 MG CHEW    Cyanocobalamin  (B-12) 500 MCG TABS    3. Aortic atherosclerosis  I70.0 atorvastatin  (LIPITOR) 40 MG tablet    omega-3 acid ethyl esters (LOVAZA ) 1 g capsule  4. Vitamin D  deficiency  E55.9 Cholecalciferol  (VITAMIN D -3) 125 MCG (5000 UT) TABS    5. Thromboangiitis obliterans (Buerger's disease)  I73.1 cilostazol  (PLETAL ) 50 MG tablet    NIFEdipine  (PROCARDIA -XL/NIFEDICAL-XL) 30 MG 24 hr tablet    6. Primary osteoarthritis involving multiple joints  M15.0 diclofenac  Sodium (VOLTAREN ) 1 % GEL    7. Chronic kidney disease, stage 2 (mild)  N18.2 diclofenac  Sodium (VOLTAREN ) 1 % GEL    8. Small fiber neuropathy  G62.9 diclofenac  Sodium (VOLTAREN ) 1 % GEL    9. Seasonal allergic rhinitis, unspecified trigger  J30.2 fluticasone  (FLONASE ) 50 MCG/ACT nasal spray    10. Other seborrheic dermatitis  L21.8 ketoconazole  (NIZORAL ) 2  % shampoo    11. Raynaud's disease without gangrene  I73.00 NIFEdipine  (PROCARDIA -XL/NIFEDICAL-XL) 30 MG 24 hr tablet    12. Dyslipidemia  E78.5 omega-3 acid ethyl esters (LOVAZA ) 1 g capsule    13. Hypertriglyceridemia  E78.1 omega-3 acid ethyl esters (LOVAZA ) 1 g capsule    14. Gastroesophageal reflux disease, unspecified whether esophagitis present  K21.9 pantoprazole  (PROTONIX ) 20 MG tablet    15. Essential tremor  G25.0 primidone  (MYSOLINE ) 50 MG tablet    16. Dandruff  L21.0 selenium  sulfide (SELSUN ) 2.5 % lotion    17. Cracking skin  L98.9 triamcinolone  cream (KENALOG ) 0.1 %         Orders Placed in Encounter:    Referral Orders         Ambulatory referral to Urology     Meds ordered this encounter  Medications   Alpha-Lipoic Acid 300 MG CAPS    Sig: Take 2 capsules (600 mg total) by mouth daily at 6 (six) AM.    Dispense:  180 capsule    Refill:  4    Patient transferring all medications from [facility], would like pill packs.   atorvastatin  (LIPITOR) 40 MG tablet    Sig: Take 1 tablet (40 mg total) by mouth at bedtime. add to pill packs if possible    Dispense:  90 tablet    Refill:  3    Patient transferring all medications from [facility], would like pill packs.   celecoxib  (CELEBREX ) 100 MG capsule    Sig: Take 1 capsule (100 mg total) by mouth 2 (two) times daily. Replaces 200 mg dose, trial step down to minimize pills    Dispense:  180 capsule    Refill:  4   Cholecalciferol  (VITAMIN D -3) 125 MCG (5000 UT) TABS    Sig: Take 1 tablet (5,000 Units total) by mouth daily.    Dispense:  90 tablet    Refill:  4    Patient transferring all medications from [facility], would like pill packs.   cilostazol  (PLETAL ) 50 MG tablet    Sig: Take 1 tablet (50 mg total) by mouth 2 (two) times daily.    Dispense:  90 tablet    Refill:  4    Patient transferring all medications from [facility], would like pill packs.   diclofenac  Sodium (VOLTAREN ) 1 % GEL     Sig: Apply 2 g topically in the morning and at bedtime.    Dispense:  1200 g    Refill:  11    Patient transferring all medications from [facility], would like pill packs.   DULoxetine  (CYMBALTA ) 30 MG capsule    Sig: Take 1 capsule (30 mg total) by mouth daily.    Dispense:  90 capsule    Refill:  3  fluticasone  (FLONASE ) 50 MCG/ACT nasal spray    Sig: Place 1 spray into both nostrils daily as needed.    Dispense:  16 g    Refill:  11    Patient transferring all medications from [facility], would like pill packs.   ketoconazole  (NIZORAL ) 2 % shampoo    Sig: APPLY TO AFFECTED AREA(S) **LET SIT FOR 3 TO 5 MINUTES BEFORE RINSING**    Dispense:  120 mL    Refill:  5   lidocaine  (XYLOCAINE ) 5 % ointment    Sig: Apply to the affected area(s) topically as needed.    Dispense:  35.44 g    Refill:  0    Patient transferring all medications from [facility], would like pill packs.   NIFEdipine  (PROCARDIA -XL/NIFEDICAL-XL) 30 MG 24 hr tablet    Sig: Take 1 tablet (30 mg total) by mouth daily.    Dispense:  100 tablet    Refill:  1    Fill for 90 days this time to keep other med refill synced   omega-3 acid ethyl esters (LOVAZA ) 1 g capsule    Sig: Take 2 capsules (2 g total) by mouth 2 (two) times daily.    Dispense:  400 capsule    Refill:  1    Fill for 90 days this time to keep all other med refills synced   pantoprazole  (PROTONIX ) 20 MG tablet    Sig: Take 1 tablet (20 mg total) by mouth daily. Replaces 40 mg dose, step down dose trial.    Dispense:  90 tablet    Refill:  4   pregabalin  (LYRICA ) 150 MG capsule    Sig: Take 1 capsule (150 mg total) by mouth 2 (two) times daily.    Dispense:  180 capsule    Refill:  1    Dose change fill when due.   primidone  (MYSOLINE ) 50 MG tablet    Sig: Take 1 tablet (50 mg total) by mouth 2 (two) times daily in morning and evening.    Dispense:  180 tablet    Refill:  3    Patient transferring all medications from [facility],  would like pill packs.   selenium  sulfide (SELSUN ) 2.5 % lotion    Sig: Apply 1 Application topically daily as needed for irritation.    Dispense:  118 mL    Refill:  12   triamcinolone  cream (KENALOG ) 0.1 %    Sig: Apply to the affected area(s) topically 2 (two) times daily. Use for up to 14 days.    Dispense:  30 g    Refill:  3    Patient transferring all medications from [facility], would like pill packs.   Ascorbic Acid  (VITAMIN C ) 500 MG CHEW    Sig: Chew 1 tablet (500 mg total) by mouth daily.    Dispense:  90 tablet    Refill:  4   Cyanocobalamin  (B-12) 500 MCG TABS    Sig: Take 1 tablet by mouth daily.    Dispense:  90 tablet    Refill:  4    Orders Placed This Encounter  Procedures   Ambulatory referral to Urology    Referral Priority:   Routine    Referral Type:   Consultation    Referral Reason:   Specialty Services Required    Requested Specialty:   Urology    Number of Visits Requested:   1       This document was synthesized by artificial intelligence ([employer])  using HIPAA-compliant recording of the clinical interaction;   We discussed the use of AI scribe software for clinical note transcription with the patient, who gave verbal consent to proceed. additional Info: This encounter employed state-of-the-art, real-time, collaborative documentation. The patient actively reviewed and assisted in updating their electronic medical record on a shared screen, ensuring transparency and facilitating joint problem-solving for the problem list, overview, and plan. This approach promotes accurate, informed care. The treatment plan was discussed and reviewed in detail, including medication safety, potential side effects, and all patient questions. We confirmed understanding and comfort with the plan. Follow-up instructions were established, including contacting the office for any concerns, returning if symptoms worsen, persist, or new symptoms develop, and precautions for potential  emergency department visits.

## 2024-04-15 NOTE — Assessment & Plan Note (Addendum)
 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 He takes pregabalin  150 mg twice daily and primidone  50 mg twice daily. He finds managing multiple prescriptions challenging and prefers a 90-day supply to minimize pharmacy visits. Continue pregabalin  150 mg twice daily and primidone  50 mg twice daily. A 90-day supply of medications has been arranged.

## 2024-04-15 NOTE — Patient Instructions (Signed)
 It was a pleasure seeing you today! Your health and satisfaction are our top priorities.  Stephen Cone, MD  VISIT SUMMARY: During your visit, we discussed your medication management and urinary symptoms. We reviewed your current medications and addressed the difficulties you have been experiencing with obtaining a 90-day supply. We also discussed your urinary symptoms and made a plan for further evaluation.  YOUR PLAN: -IDIOPATHIC PERIPHERAL NEUROPATHY: Idiopathic peripheral neuropathy is a condition where the nerves outside the brain and spinal cord are damaged for unknown reasons. We will continue your current medications, pregabalin  150 mg twice daily and primidone  50 mg twice daily. A 90-day supply of your medications has been arranged to help you manage them more easily.  -ESSENTIAL TREMOR: Essential tremor is a nervous system disorder that causes involuntary and rhythmic shaking. We will continue your current dose of primidone , 50 mg twice daily, with two tablets in the morning and one at night.  -LOWER URINARY TRACT SYMPTOMS: Your symptoms of urgency, occasional incontinence, and difficulty postponing urination may be related to prostate issues. We have referred you to a urologist for further evaluation.  INSTRUCTIONS: Please follow up with the urologist for your prostate evaluation as soon as possible. Continue taking your medications as prescribed, and use your weekly pill organizer to help manage them. If you experience any new or worsening symptoms, please contact our office.  Your Providers PCP: [name], MD,  [phone]) Referring Provider: [name], MD,  [phone]) Care Team Provider: Evonnie Asberry RAMAN, DO,  [phone]) Care Team Provider: Neysa Reggy BIRCH, MD,  [phone]) Care Team Provider: Joshua Barters Care Team Provider: Gerome Charleston, MD Care Team Provider: Donnald Charleston, MD,  [phone]) Care Team Provider: Leslee Reusing, MD,   [phone]) Care Team Provider: Leslee Reusing, MD,  [phone]) Care Team Provider: Livingston Rigg, MD,  [phone]) Care Team Provider: Porter Andrez SAUNDERS, PA-C Care Team Provider: Lonni Slain, MD,  [phone]) Care Team Provider: Dianna Specking, MD,  [phone]) Care Team Provider: Jeannetta Lonni ORN, MD,  [phone]) Care Team Provider: Karis Clunes, MD,  [phone])  NEXT STEPS: [x]  Early Intervention: Schedule sooner appointment, call our on-call services, or go to emergency room if there is any significant Increase in pain or discomfort New or worsening symptoms Sudden or severe changes in your health [x]  Flexible Follow-Up: We recommend a No follow-ups on file. for optimal routine care. This allows for progress monitoring and treatment adjustments. [x]  Preventive Care: Schedule your annual preventive care visit! It's typically covered by insurance and helps identify potential health issues early. [x]  Lab & X-ray Appointments: Incomplete tests scheduled today, or call to schedule. X-rays: [facility] (M-F, 8:30am-noon or 1pm-5pm). [x]  Medical Information Release: Sign a release form at front desk to obtain relevant medical information we don't have.  MAKING THE MOST OF OUR FOCUSED 20 MINUTE APPOINTMENTS: [x]   Clearly state your top concerns at the beginning of the visit to focus our discussion [x]   If you anticipate you will need more time, please inform the front desk during scheduling - we can book multiple appointments in the same week. [x]   If you have transportation problems- use our convenient video appointments or ask about transportation support. [x]   We can get down to business faster if you use MyChart to update information before the visit and submit non-urgent questions before your visit. Thank you for taking the time to provide details through MyChart.  Let our nurse know and she can import this information into  your  encounter documents.  Arrival and Wait Times: [x]   Arriving on time ensures that everyone receives prompt attention. [x]   Early morning (8a) and afternoon (1p) appointments tend to have shortest wait times. [x]   Unfortunately, we cannot delay appointments for late arrivals or hold slots during phone calls.  Getting Answers and Following Up [x]   Simple Questions & Concerns: For quick questions or basic follow-up after your visit, reach us  at [phone] or MyChart messaging. [x]   Complex Concerns: If your concern is more complex, scheduling an appointment might be best. Discuss this with the staff to find the most suitable option. [x]   Lab & Imaging Results: We'll contact you directly if results are abnormal or you don't use MyChart. Most normal results will be on MyChart within 2-3 business days, with a review message from Dr. Jesus. Haven't heard back in 2 weeks? Need results sooner? Contact us  at [phone]. [x]   Referrals: Our referral coordinator will manage specialist referrals. The specialist's office should contact you within 2 weeks to schedule an appointment. Call us  if you haven't heard from them after 2 weeks.  Staying Connected [x]   MyChart: Activate your MyChart for the fastest way to access results and message us . See the last page of this paperwork for instructions on how to activate.  Bring to Your Next Appointment [x]   Medications: Please bring all your medication bottles to your next appointment to ensure we have an accurate record of your prescriptions. [x]   Health Diaries: If you're monitoring any health conditions at home, keeping a diary of your readings can be very helpful for discussions at your next appointment.  Billing [x]   X-ray & Lab Orders: These are billed by separate companies. Contact the invoicing company directly for questions or concerns. [x]   Visit Charges: Discuss any billing inquiries with our administrative services team.  Your Satisfaction  Matters [x]   Share Your Experience: We strive for your satisfaction! If you have any complaints, or preferably compliments, please let Dr. Jesus know directly or contact our Practice Administrators, Manuelita Rubin or [employer], by asking at the front desk.   Reviewing Your Records [x]   Review this early draft of your clinical encounter notes below and the final encounter summary tomorrow on MyChart after its been completed.  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 He takes pregabalin  150 mg twice daily and primidone  50 mg twice daily. He finds managing multiple prescriptions challenging and prefers a 90-day supply to minimize pharmacy visits. Continue pregabalin  150 mg twice daily and primidone  50 mg twice daily. A 90-day supply of medications has been arranged.  Orders: -     Alpha-Lipoic Acid; Take 2 capsules (600 mg total) by mouth daily at 6 (six) AM.  Dispense: 180 capsule; Refill: 4 -     Celecoxib ; Take 1 capsule (100 mg total) by mouth 2 (two) times daily. Replaces 200 mg dose, trial step down to minimize pills  Dispense: 180 capsule; Refill: 4 -     Diclofenac  Sodium; Apply 2 g topically in the morning and at bedtime.  Dispense: 1200 g; Refill: 11 -     DULoxetine  HCl; Take 1 capsule (30 mg total) by mouth daily.  Dispense: 90 capsule; Refill: 3 -     Lidocaine ; Apply to the affected area(s)  topically as needed.  Dispense: 35.44 g; Refill: 0 -     Pregabalin ; Take 1 capsule (150 mg total) by mouth 2 (two) times daily.  Dispense: 180 capsule; Refill: 1 -     Vitamin C ; Chew 1 tablet (500 mg total) by mouth daily.  Dispense: 90 tablet; Refill: 4 -     B-12; Take 1 tablet by mouth daily.  Dispense: 90 tablet; Refill: 4  Aortic atherosclerosis -      Atorvastatin  Calcium ; Take 1 tablet (40 mg total) by mouth at bedtime. add to pill packs if possible  Dispense: 90 tablet; Refill: 3 -     Omega-3-acid  Ethyl Esters; Take 2 capsules (2 g total) by mouth 2 (two) times daily.  Dispense: 400 capsule; Refill: 1  Vitamin D  deficiency -     Vitamin D -3; Take 1 tablet (5,000 Units total) by mouth daily.  Dispense: 90 tablet; Refill: 4  Thromboangiitis obliterans (Buerger's disease) -     Cilostazol ; Take 1 tablet (50 mg total) by mouth 2 (two) times daily.  Dispense: 90 tablet; Refill: 4 -     NIFEdipine  ER Osmotic Release; Take 1 tablet (30 mg total) by mouth daily.  Dispense: 100 tablet; Refill: 1  Primary osteoarthritis involving multiple joints -     Diclofenac  Sodium; Apply 2 g topically in the morning and at bedtime.  Dispense: 1200 g; Refill: 11  Chronic kidney disease, stage 2 (mild) -     Diclofenac  Sodium; Apply 2 g topically in the morning and at bedtime.  Dispense: 1200 g; Refill: 11  Small fiber neuropathy -     Diclofenac  Sodium; Apply 2 g topically in the morning and at bedtime.  Dispense: 1200 g; Refill: 11  Seasonal allergic rhinitis, unspecified trigger -     Fluticasone  Propionate; Place 1 spray into both nostrils daily as needed.  Dispense: 16 g; Refill: 11  Other seborrheic dermatitis -     Ketoconazole ; APPLY TO AFFECTED AREA(S) **LET SIT FOR 3 TO 5 MINUTES BEFORE RINSING**  Dispense: 120 mL; Refill: 5  Raynaud's disease without gangrene -     NIFEdipine  ER Osmotic Release; Take 1 tablet (30 mg total) by mouth daily.  Dispense: 100 tablet; Refill: 1  Dyslipidemia -     Omega-3-acid  Ethyl Esters; Take 2 capsules (2 g total) by mouth 2 (two) times daily.  Dispense: 400 capsule; Refill: 1  Hypertriglyceridemia -     Omega-3-acid  Ethyl Esters; Take 2 capsules (2 g total) by mouth 2 (two) times daily.  Dispense: 400 capsule; Refill: 1  Gastroesophageal reflux disease, unspecified whether esophagitis present -      Pantoprazole  Sodium; Take 1 tablet (20 mg total) by mouth daily. Replaces 40 mg dose, step down dose trial.  Dispense: 90 tablet; Refill: 4  Essential tremor -     Primidone ; Take 1 tablet (50 mg total) by mouth 2 (two) times daily in morning and evening.  Dispense: 180 tablet; Refill: 3  Dandruff -     Selenium  Sulfide; Apply 1 Application topically daily as needed for irritation.  Dispense: 118 mL; Refill: 12  Cracking skin -     Triamcinolone  Acetonide; Apply to the affected area(s) topically 2 (two) times daily. Use for up to 14 days.  Dispense: 30 g; Refill: 3  Medication management

## 2024-04-15 NOTE — Assessment & Plan Note (Signed)
 Essential tremor is managed with primidone , currently at 50 mg twice daily, with two tablets in the morning and one at night. Continue primidone  50 mg twice daily.

## 2024-04-15 NOTE — Assessment & Plan Note (Signed)
 Lower urinary tract symptoms  He reports urgency, occasional incontinence, and difficulty postponing urination, with no nocturia. Symptoms suggest possible prostate issues. Referred to a urologist for prostate evaluation.

## 2024-04-16 ENCOUNTER — Other Ambulatory Visit: Payer: Self-pay

## 2024-04-16 ENCOUNTER — Other Ambulatory Visit (HOSPITAL_BASED_OUTPATIENT_CLINIC_OR_DEPARTMENT_OTHER): Payer: Self-pay

## 2024-04-17 ENCOUNTER — Other Ambulatory Visit (HOSPITAL_BASED_OUTPATIENT_CLINIC_OR_DEPARTMENT_OTHER): Payer: Self-pay

## 2024-04-30 ENCOUNTER — Other Ambulatory Visit (HOSPITAL_BASED_OUTPATIENT_CLINIC_OR_DEPARTMENT_OTHER): Payer: Self-pay

## 2024-05-07 ENCOUNTER — Other Ambulatory Visit (HOSPITAL_BASED_OUTPATIENT_CLINIC_OR_DEPARTMENT_OTHER): Payer: Self-pay

## 2024-06-10 ENCOUNTER — Ambulatory Visit

## 2024-06-11 ENCOUNTER — Ambulatory Visit: Admitting: Internal Medicine
